# Patient Record
Sex: Female | Born: 1985
Health system: Southern US, Community
[De-identification: ages and names within clinical notes are randomized; demographics above are authoritative.]

## PROBLEM LIST (undated history)

## (undated) DIAGNOSIS — Z862 Personal history of diseases of the blood and blood-forming organs and certain disorders involving the immune mechanism: Secondary | ICD-10-CM

## (undated) DIAGNOSIS — I10 Essential (primary) hypertension: Secondary | ICD-10-CM

## (undated) DIAGNOSIS — I71012 Dissection of descending thoracic aorta: Secondary | ICD-10-CM

## (undated) DIAGNOSIS — I7101 Dissection of thoracic aorta: Secondary | ICD-10-CM

## (undated) DIAGNOSIS — K859 Acute pancreatitis without necrosis or infection, unspecified: Secondary | ICD-10-CM

## (undated) HISTORY — DX: Dissection of descending thoracic aorta: I71.012

## (undated) HISTORY — DX: Dissection of thoracic aorta: I71.01

---
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
## 2006-08-27 ENCOUNTER — Emergency Department (HOSPITAL_COMMUNITY): Admission: EM | Admit: 2006-08-27 | Discharge: 2006-08-27 | Payer: Self-pay | Admitting: Emergency Medicine

## 2006-10-01 ENCOUNTER — Other Ambulatory Visit: Admission: RE | Admit: 2006-10-01 | Discharge: 2006-10-01 | Payer: Self-pay | Admitting: Obstetrics and Gynecology

## 2008-03-21 ENCOUNTER — Other Ambulatory Visit: Admission: RE | Admit: 2008-03-21 | Discharge: 2008-03-21 | Payer: Self-pay | Admitting: Obstetrics and Gynecology

## 2008-03-23 ENCOUNTER — Ambulatory Visit (HOSPITAL_COMMUNITY): Admission: RE | Admit: 2008-03-23 | Discharge: 2008-03-23 | Payer: Self-pay | Admitting: Obstetrics and Gynecology

## 2008-04-13 ENCOUNTER — Ambulatory Visit (HOSPITAL_COMMUNITY): Admission: RE | Admit: 2008-04-13 | Discharge: 2008-04-13 | Payer: Self-pay | Admitting: Obstetrics and Gynecology

## 2008-05-04 ENCOUNTER — Ambulatory Visit (HOSPITAL_COMMUNITY): Admission: RE | Admit: 2008-05-04 | Discharge: 2008-05-04 | Payer: Self-pay | Admitting: Obstetrics and Gynecology

## 2008-06-01 ENCOUNTER — Ambulatory Visit (HOSPITAL_COMMUNITY): Admission: RE | Admit: 2008-06-01 | Discharge: 2008-06-01 | Payer: Self-pay | Admitting: Obstetrics and Gynecology

## 2008-09-21 ENCOUNTER — Inpatient Hospital Stay (HOSPITAL_COMMUNITY): Admission: AD | Admit: 2008-09-21 | Discharge: 2008-09-23 | Payer: Self-pay | Admitting: Obstetrics and Gynecology

## 2009-04-11 ENCOUNTER — Other Ambulatory Visit: Admission: RE | Admit: 2009-04-11 | Discharge: 2009-04-11 | Payer: Self-pay | Admitting: Otolaryngology

## 2009-05-01 ENCOUNTER — Encounter (INDEPENDENT_AMBULATORY_CARE_PROVIDER_SITE_OTHER): Payer: Self-pay | Admitting: Otolaryngology

## 2009-05-01 ENCOUNTER — Ambulatory Visit (HOSPITAL_BASED_OUTPATIENT_CLINIC_OR_DEPARTMENT_OTHER): Admission: RE | Admit: 2009-05-01 | Discharge: 2009-05-02 | Payer: Self-pay | Admitting: Otolaryngology

## 2009-05-01 HISTORY — PX: SUBMANDIBULAR GLAND EXCISION: SHX2456

## 2010-01-29 ENCOUNTER — Emergency Department (HOSPITAL_COMMUNITY): Admission: EM | Admit: 2010-01-29 | Discharge: 2010-01-29 | Payer: Self-pay | Admitting: Emergency Medicine

## 2011-01-08 ENCOUNTER — Inpatient Hospital Stay (HOSPITAL_COMMUNITY)
Admission: AD | Admit: 2011-01-08 | Discharge: 2011-01-08 | Payer: Self-pay | Source: Home / Self Care | Attending: Obstetrics & Gynecology | Admitting: Obstetrics & Gynecology

## 2011-01-09 LAB — URINALYSIS, ROUTINE W REFLEX MICROSCOPIC
Hgb urine dipstick: NEGATIVE
Urine Glucose, Fasting: NEGATIVE mg/dL

## 2011-01-09 LAB — GC/CHLAMYDIA PROBE AMP, GENITAL: GC Probe Amp, Genital: NEGATIVE

## 2011-01-09 LAB — WET PREP, GENITAL

## 2011-02-21 ENCOUNTER — Other Ambulatory Visit (HOSPITAL_COMMUNITY)
Admission: RE | Admit: 2011-02-21 | Discharge: 2011-02-21 | Disposition: A | Discharge status: Home / Self Care | Payer: Medicaid Other | Source: Ambulatory Visit | Attending: Obstetrics and Gynecology | Admitting: Obstetrics and Gynecology

## 2011-02-21 ENCOUNTER — Other Ambulatory Visit: Payer: Self-pay | Admitting: Obstetrics and Gynecology

## 2011-02-21 DIAGNOSIS — Z01419 Encounter for gynecological examination (general) (routine) without abnormal findings: Secondary | ICD-10-CM | POA: Insufficient documentation

## 2011-02-21 DIAGNOSIS — Z113 Encounter for screening for infections with a predominantly sexual mode of transmission: Secondary | ICD-10-CM | POA: Insufficient documentation

## 2011-03-01 ENCOUNTER — Inpatient Hospital Stay (HOSPITAL_COMMUNITY)
Admission: AD | Admit: 2011-03-01 | Discharge: 2011-03-01 | Disposition: A | Discharge status: Home / Self Care | Payer: Medicaid Other | Source: Ambulatory Visit | Attending: Obstetrics and Gynecology | Admitting: Obstetrics and Gynecology

## 2011-03-01 DIAGNOSIS — O212 Late vomiting of pregnancy: Secondary | ICD-10-CM

## 2011-03-01 DIAGNOSIS — O99891 Other specified diseases and conditions complicating pregnancy: Secondary | ICD-10-CM | POA: Insufficient documentation

## 2011-03-01 DIAGNOSIS — R1012 Left upper quadrant pain: Secondary | ICD-10-CM

## 2011-03-01 DIAGNOSIS — O9989 Other specified diseases and conditions complicating pregnancy, childbirth and the puerperium: Secondary | ICD-10-CM

## 2011-03-01 LAB — COMPREHENSIVE METABOLIC PANEL
ALT: 27 U/L (ref 0–35)
AST: 19 U/L (ref 0–37)
Alkaline Phosphatase: 48 U/L (ref 39–117)
Calcium: 8.8 mg/dL (ref 8.4–10.5)
Creatinine, Ser: 0.51 mg/dL (ref 0.4–1.2)
GFR calc Af Amer: >60 mL/min (ref >60)
Glucose, Bld: 76 mg/dL (ref 70–99)
Potassium: 3.6 mEq/L (ref 3.5–5.1)
Sodium: 136 mEq/L (ref 135–145)
Total Bilirubin: 0.8 mg/dL (ref 0.3–1.2)

## 2011-03-01 LAB — URINALYSIS, ROUTINE W REFLEX MICROSCOPIC
Specific Gravity, Urine: 1.015 (ref 1.005–1.030)
pH: 7 (ref 5.0–8.0)

## 2011-03-01 LAB — CBC
Hemoglobin: 9.6 g/dL — ABNORMAL LOW (ref 12.0–15.0)
MCH: 31.6 pg (ref 26.0–34.0)
MCHC: 32.5 g/dL (ref 30.0–36.0)
MCV: 97 fL (ref 78.0–100.0)
Platelets: 171 10*3/uL (ref 150–400)
RBC: 3.04 MIL/uL — ABNORMAL LOW (ref 3.87–5.11)
RDW: 13.6 % (ref 11.5–15.5)

## 2011-03-26 LAB — BASIC METABOLIC PANEL
BUN: 6 mg/dL (ref 6–23)
GFR calc Af Amer: >60 mL/min (ref >60)
Potassium: 3.4 mEq/L — ABNORMAL LOW (ref 3.5–5.1)
Sodium: 141 mEq/L (ref 135–145)

## 2011-04-17 ENCOUNTER — Inpatient Hospital Stay (HOSPITAL_COMMUNITY)
Admission: AD | Admit: 2011-04-17 | Discharge: 2011-04-21 | DRG: 774 | Disposition: A | Discharge status: Home / Self Care | Payer: Medicaid Other | Source: Ambulatory Visit | Attending: Obstetrics and Gynecology | Admitting: Obstetrics and Gynecology

## 2011-04-17 ENCOUNTER — Inpatient Hospital Stay (HOSPITAL_COMMUNITY): Payer: Medicaid Other

## 2011-04-17 DIAGNOSIS — O139 Gestational [pregnancy-induced] hypertension without significant proteinuria, unspecified trimester: Principal | ICD-10-CM | POA: Diagnosis present

## 2011-04-17 LAB — CBC
HCT: 31 % — ABNORMAL LOW (ref 36.0–46.0)
MCHC: 32.9 g/dL (ref 30.0–36.0)
RBC: 3.34 MIL/uL — ABNORMAL LOW (ref 3.87–5.11)
RDW: 13.1 % (ref 11.5–15.5)

## 2011-04-17 LAB — COMPREHENSIVE METABOLIC PANEL
ALT: 18 U/L (ref 0–35)
Albumin: 2.4 g/dL — ABNORMAL LOW (ref 3.5–5.2)
CO2: 19 mEq/L (ref 19–32)
Chloride: 106 mEq/L (ref 96–112)
GFR calc Af Amer: >60 mL/min (ref >60)
Total Bilirubin: 0.6 mg/dL (ref 0.3–1.2)

## 2011-04-17 LAB — URINALYSIS, DIPSTICK ONLY
Hgb urine dipstick: NEGATIVE
Ketones, ur: NEGATIVE mg/dL
Leukocytes, UA: NEGATIVE
Protein, ur: NEGATIVE mg/dL
Specific Gravity, Urine: <1.005 — ABNORMAL LOW (ref 1.005–1.030)
pH: 7 (ref 5.0–8.0)

## 2011-04-17 LAB — URIC ACID: Uric Acid, Serum: 3.1 mg/dL (ref 2.4–7.0)

## 2011-04-18 DIAGNOSIS — O139 Gestational [pregnancy-induced] hypertension without significant proteinuria, unspecified trimester: Secondary | ICD-10-CM

## 2011-04-19 LAB — CBC
HCT: 29 % — ABNORMAL LOW (ref 36.0–46.0)
Hemoglobin: 9.2 g/dL — ABNORMAL LOW (ref 12.0–15.0)
MCH: 29.8 pg (ref 26.0–34.0)
MCHC: 31.7 g/dL (ref 30.0–36.0)
MCV: 93.9 fL (ref 78.0–100.0)
RDW: 13.4 % (ref 11.5–15.5)

## 2011-04-19 LAB — COMPREHENSIVE METABOLIC PANEL
ALT: 16 U/L (ref 0–35)
BUN: 4 mg/dL — ABNORMAL LOW (ref 6–23)
CO2: 23 mEq/L (ref 19–32)
Calcium: 7.6 mg/dL — ABNORMAL LOW (ref 8.4–10.5)
Creatinine, Ser: 0.58 mg/dL (ref 0.4–1.2)
GFR calc non Af Amer: >60 mL/min (ref >60)
Glucose, Bld: 105 mg/dL — ABNORMAL HIGH (ref 70–99)

## 2011-04-20 LAB — RPR: RPR Ser Ql: NONREACTIVE

## 2011-04-30 NOTE — Op Note (Signed)
NAME:  Robin Guerrero, Robin Guerrero           ACCOUNT NO.:  [id number]   MEDICAL RECORD NO.:  [id number]          PATIENT TYPE:  AMB   LOCATION:  DSC                          FACILITY:  [facility]   PHYSICIAN:  Jefry H. Pollyann Kennedy, MD     DATE OF BIRTH:  Nov 03, 1986   DATE OF PROCEDURE:  05/01/2009  DATE OF DISCHARGE:                               OPERATIVE REPORT   PREOPERATIVE DIAGNOSIS:  Right submandibular mass.   POSTOPERATIVE DIAGNOSIS:  Right submandibular mass.   PROCEDURE:  Excision of the right submandibular gland.   SURGEON:  Jefry H. Pollyann Kennedy, MD   ASSISTANT:  Kinnie Scales. Annalee Genta, MD   ANESTHESIA:  General endotracheal anesthesia was used.   COMPLICATIONS:  None.   ESTIMATED BLOOD LOSS:  40 mL.   FINDINGS:  Firm 3-4 cm mass within the right submandibular gland, one  enlarged facial lymph node about 1 cm in diameter.  No other abnormal  findings.   REFERRING PHYSICIAN:  Dr. Para March.   HISTORY:  A 25 year old with a history of a slowly enlarging mass under  the right side of the jaw bone.  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  The risks, benefits, alternatives,  complications of the procedure were explained to the patient.  She  seemed to understand and agreed for surgery.   PROCEDURE:  The patient was taken to the operating room and placed in  the operating table in supine position.  Following induction of the  general endotracheal anesthesia using a laryngeal mask airway, the right  side of the neck was prepped and draped in standard fashion.  An  incision was outlined with a marking pen in a skin crease about two  fingerbreadths below the mandible.  Xylocaine with epinephrine was  injected.  A 15 scalpel was used to incise the size and subcutaneous  tissue.  Electrocautery was used to complete the dissection down to the  platysma layer.  Subplatysmal flap was developed superiorly up to the  mandible.  Attempts were made to identify the mandibular branch of  the  facial nerve, but in the plain of dissection, this was not identified  and presumably was up closer to the mandible.  Dissection continued down  to the fascial vessels, which were separately ligated between clamps and  divided.  A solitary facial lymph node was dissected free and  surrounding tissue was taken with the specimen.  The deeper dissection  continued down towards the mylohyoid muscle.  This was reflected  anteriorly exposing the submandibular duct, which was ligated between  clamps and divided.  The lingual nerve was preserved.  The ganglion was  taken with the specimen.  The fascial artery was identified and also  ligated between clamps and divided.  Specimens brought off of the  lateral surface of the digastric muscle.  The hypoglossal nerve was not  seen during the dissection and was felt to be deeper than the level of  dissection.  The gland was removed from the wound and sent for  pathologic evaluation.  The wound was irrigated, cautery and silk  ligatures were used for completion of  hemostasis.  The wound was then  closed in layers using 3-0 chromic on the platysma layer and the  subcuticular layer.  Dermabond was used on the skin.  A 7-French round  JP drain was left in the wound, exiting through the posterior aspect of  the incision and secured in placed with the nylon suture.  The patient  was awakened from anesthesia, extubated, and transferred to recovery in  stable condition.      Jefry H. Pollyann Kennedy, MD  Electronically Signed     JHR/MEDQ  D:  05/01/2009  T:  05/02/2009  Job:  [id number]   cc:   Dr. Para March

## 2011-07-20 NOTE — Discharge Summary (Signed)
NAMEMarland Guerrero  MADI, BONFIGLIO NO.:  [id number]  MEDICAL RECORD NO.:  [id number]  LOCATION:  9131                          FACILITY:  [facility]  PHYSICIAN:  Pieter Partridge, MD   DATE OF BIRTH:  07/27/1986  DATE OF ADMISSION:  04/17/2011 DATE OF DISCHARGE:  04/21/2011                              DISCHARGE SUMMARY   ADMITTING DIAGNOSES:  Pregnancy at 37-6/7 weeks', gestational hypertension with severe range blood pressures.  DISCHARGE DIAGNOSES:  Pregnancy at 37-6/7 weeks', gestational hypertension with severe range blood pressures and status post normal spontaneous vaginal delivery.  HOSPITAL COURSE:  Ms. [name] is a 25 year old gravida 2, para 1-0-0-1 who presented to the office at 37 weeks for a routine OB visit and was noted to have blood pressure of 144/104.  She was then admitted to the hospital for observation.  Her blood pressures remained elevated and in the severe range.  She was then induced with Cervidil on Apr 18, 2011. She had a precipitous normal vaginal delivery 7 pounds 6 ounces girl, healthy, Apgars 8 and 9.  Her postoperative course, she was on magnesium for a total of 24 hours. Her blood pressures postpartum day #1 range from 125/62 to 78/100. Overnight on postpartum day #1, her blood pressures were elevated in ICU and she was already on Procardia XL 30 mg due to the severe elevation, labetalol 100 b.i.d. was added.  The blood pressure reading, her diastolics got up into 105 and 118, so at time she was given Labetalol.  On the morning of postpartum day #2, the patient was without complaints, no headaches, visual changes, blood pressure range from 124 to 144/68 to 95 that was with Procardia and labetalol on board.  Her labetalol supposed to be healed and to continue observation.  However, due to ordering and timing it was continued, so the patient was actually held for another day to see how she responded on 1 agent.  On postpartum day #3, her blood  pressures were good.  The diastolics were a little low 134/94 the blood pressure in the morning just on Procardia.  The patient was discharged home on Procardia XL 30 mg.  There was a Forensic psychologist consult ordered as well.  The patient was under little stress going to a new apartment without the help of her mother, so she would be followed closely to make sure she did not develop postpartum depression.  She is to follow up in 1 week at her doctor's office, Dr. Richardson Dopp.  DISCHARGE INFORMATION:  The patient was discharged to home in stable condition.  ACTIVITY:  Pelvic rest.  DIET:  Low sodium.  MEDICATIONS:  Procardia XL 30 mg p.o.  Return visit in 1-week.     Pieter Partridge, MD     EBV/MEDQ  D:  07/19/2011  T:  07/20/2011  Job:  [id number]

## 2011-08-22 ENCOUNTER — Ambulatory Visit (HOSPITAL_COMMUNITY)
Admission: RE | Admit: 2011-08-22 | Discharge: 2011-08-22 | Disposition: A | Discharge status: Home / Self Care | Payer: Self-pay | Attending: Psychiatry | Admitting: Psychiatry

## 2011-08-22 ENCOUNTER — Ambulatory Visit (HOSPITAL_BASED_OUTPATIENT_CLINIC_OR_DEPARTMENT_OTHER): Payer: Medicaid Other | Admitting: Psychology

## 2011-08-22 DIAGNOSIS — F322 Major depressive disorder, single episode, severe without psychotic features: Secondary | ICD-10-CM

## 2011-08-22 DIAGNOSIS — F321 Major depressive disorder, single episode, moderate: Secondary | ICD-10-CM | POA: Insufficient documentation

## 2011-08-28 ENCOUNTER — Encounter (HOSPITAL_COMMUNITY): Payer: Medicaid Other | Admitting: Psychology

## 2011-09-16 LAB — COMPREHENSIVE METABOLIC PANEL
ALT: 24
BUN: 3 — ABNORMAL LOW
Calcium: 9.2
Glucose, Bld: 96
Sodium: 139
Total Protein: 5.9 — ABNORMAL LOW

## 2011-09-16 LAB — CBC
HCT: 32.1 — ABNORMAL LOW
Hemoglobin: 8.7 — ABNORMAL LOW
Platelets: 182
RBC: 2.55 — ABNORMAL LOW
RDW: 12.9

## 2011-09-16 LAB — URINALYSIS, ROUTINE W REFLEX MICROSCOPIC
Bilirubin Urine: NEGATIVE
Leukocytes, UA: NEGATIVE
Nitrite: NEGATIVE
Specific Gravity, Urine: 1.01
Urobilinogen, UA: 0.2

## 2011-09-16 LAB — URINE MICROSCOPIC-ADD ON

## 2011-09-16 LAB — URIC ACID: Uric Acid, Serum: 3.5

## 2011-09-16 LAB — LACTATE DEHYDROGENASE: LDH: 143

## 2012-01-16 ENCOUNTER — Encounter (HOSPITAL_COMMUNITY): Payer: Self-pay | Admitting: General Practice

## 2012-01-16 ENCOUNTER — Emergency Department (HOSPITAL_COMMUNITY)
Admission: EM | Admit: 2012-01-16 | Discharge: 2012-01-16 | Disposition: A | Discharge status: Home / Self Care | Payer: Self-pay | Attending: Emergency Medicine | Admitting: Emergency Medicine

## 2012-01-16 DIAGNOSIS — J329 Chronic sinusitis, unspecified: Secondary | ICD-10-CM | POA: Insufficient documentation

## 2012-01-16 DIAGNOSIS — IMO0001 Reserved for inherently not codable concepts without codable children: Secondary | ICD-10-CM | POA: Insufficient documentation

## 2012-01-16 DIAGNOSIS — J069 Acute upper respiratory infection, unspecified: Secondary | ICD-10-CM | POA: Insufficient documentation

## 2012-01-16 DIAGNOSIS — R5381 Other malaise: Secondary | ICD-10-CM | POA: Insufficient documentation

## 2012-01-16 HISTORY — DX: Essential (primary) hypertension: I10

## 2012-01-16 MED ORDER — AZITHROMYCIN 250 MG PO TABS: 250.0000 mg | ORAL_TABLET | Freq: Every day | ORAL | Status: AC

## 2012-01-16 NOTE — ED Provider Notes (Signed)
Medical screening examination/treatment/procedure(s) were performed by non-physician practitioner and as supervising physician I was immediately available for consultation/collaboration. Briza Bark, MD, FACEP   Shaterria Sager L Shaylin Blatt, MD 01/16/12 1513 

## 2012-01-16 NOTE — ED Notes (Signed)
Pt c/o of Generalized body aches and fever. Pt c/o of cough and popping in her ears. Pt also reports diarrhea, nausea but no vomiting.

## 2012-01-16 NOTE — ED Notes (Signed)
Patient reports that she has had flu like symptoms since Sunday. Patient states that today she started having diarrhea and her other issues were unresolved and fel that she needed to be seen in the ED.

## 2012-01-16 NOTE — ED Provider Notes (Addendum)
History     CSN: [id number]  Arrival date & time 01/16/12  1048   First MD Initiated Contact with Patient 01/16/12 1142      Chief Complaint  Patient presents with  . Generalized Body Aches  . Weakness  . Nasal Congestion    (Consider location/radiation/quality/duration/timing/severity/associated sxs/prior treatment) Patient is a 26 y.o. female presenting with weakness. The history is provided by the patient.  Weakness Primary symptoms do not include fever or vomiting. Primary symptoms comment: Hot/cold chills, cough, body aches, generally weak and diarrhea for several days. The symptoms began 5 to 7 days ago. Context: She has congestion, sinus pressure and feels her ears are 'popping'.   Associated symptoms comments: She reports her son had similar symptoms last week. .    Past Medical History  Diagnosis Date  . Hypertension   . Depression     Past Surgical History  Procedure Date  . 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  History  Substance Use Topics  . Smoking status: Current Everyday Smoker -- 25.0 packs/day  . Smokeless tobacco: Not on file  . Alcohol Use: No    OB History    Grav Para Term Preterm Abortions TAB SAB Ect Mult Living                  Review of Systems  Constitutional: Negative for fever and chills.  HENT: Positive for ear pain and congestion.   Respiratory: Positive for cough.   Cardiovascular: Negative.  Negative for chest pain.  Gastrointestinal: Positive for diarrhea. Negative for vomiting.  Genitourinary: Negative for dysuria.  Musculoskeletal: Positive for myalgias and arthralgias.  Skin: Negative.  Negative for rash.    Allergies  Review of patient's allergies indicates no known allergies.  Home Medications   Current Outpatient Rx  Name Route Sig Dispense Refill  . NIFEDIPINE ER OSMOTIC 60 MG PO TB24 Oral Take 60 mg by mouth daily.    Marland Kitchen PHENYLEPHRINE-DM-GG-APAP  5-10-200-325 MG PO TABS Oral Take 2 tablets by mouth daily as needed. Flu symptons    . SERTRALINE HCL 100 MG PO TABS Oral Take 100 mg by mouth daily.      BP 167/119  Pulse 96  Temp(Src) 98.7 F (37.1 C) (Oral)  Resp 18  Wt 200 lb (90.719 kg)  SpO2 100%  LMP 12/26/2011  Physical Exam  Constitutional: She appears well-developed and well-nourished.  HENT:  Head: Normocephalic.  Right Ear: External ear normal.  Left Ear: External ear normal.  Nose: Mucosal edema present. Right sinus exhibits frontal sinus tenderness. Left sinus exhibits frontal sinus tenderness.  Mouth/Throat: Oropharynx is clear and moist.  Neck: Normal range of motion. Neck supple.  Cardiovascular: Normal rate and normal heart sounds.   No murmur heard. Pulmonary/Chest: Effort normal and breath sounds normal. She has no wheezes. She has no rales.  Abdominal: Soft. Bowel sounds are normal. She exhibits no distension. There is no tenderness. There is no rebound and no guarding.  Musculoskeletal: Normal range of motion.  Lymphadenopathy:    She has no cervical adenopathy.  Skin: Skin is warm and dry. No pallor.    ED Course  Procedures (including critical care time)  Labs Reviewed - No data to display No results found.   No diagnosis found.    MDM          Rodena Medin, PA-C 01/16/12 1236  Melvenia Beam  A Narvaez, PA-C 02/19/12 0800

## 2012-02-21 NOTE — ED Provider Notes (Signed)
See prior note   Ward Givens, MD 02/21/12 1843

## 2012-07-04 ENCOUNTER — Emergency Department (HOSPITAL_COMMUNITY)
Admission: EM | Admit: 2012-07-04 | Discharge: 2012-07-05 | Disposition: A | Discharge status: Home / Self Care | Payer: Self-pay | Attending: Emergency Medicine | Admitting: Emergency Medicine

## 2012-07-04 ENCOUNTER — Encounter (HOSPITAL_COMMUNITY): Payer: Self-pay | Admitting: Emergency Medicine

## 2012-07-04 DIAGNOSIS — R599 Enlarged lymph nodes, unspecified: Secondary | ICD-10-CM | POA: Insufficient documentation

## 2012-07-04 DIAGNOSIS — J039 Acute tonsillitis, unspecified: Secondary | ICD-10-CM | POA: Insufficient documentation

## 2012-07-04 DIAGNOSIS — R6883 Chills (without fever): Secondary | ICD-10-CM | POA: Insufficient documentation

## 2012-07-04 DIAGNOSIS — F17200 Nicotine dependence, unspecified, uncomplicated: Secondary | ICD-10-CM | POA: Insufficient documentation

## 2012-07-04 NOTE — ED Notes (Signed)
Pt alert, nad, c/o sore throat, onset a few days ago, resp even unlabored, skin pwd, denies recent ill contacts or exposure

## 2012-07-05 ENCOUNTER — Emergency Department (HOSPITAL_COMMUNITY): Payer: Self-pay

## 2012-07-05 LAB — POCT I-STAT, CHEM 8
BUN: 10 mg/dL (ref 6–23)
Calcium, Ion: 1.2 mmol/L (ref 1.12–1.23)
Chloride: 105 mEq/L (ref 96–112)
Glucose, Bld: 76 mg/dL (ref 70–99)

## 2012-07-05 LAB — CBC
HCT: 38.4 % (ref 36.0–46.0)
MCV: 91 fL (ref 78.0–100.0)
RBC: 4.22 MIL/uL (ref 3.87–5.11)
WBC: 12.8 10*3/uL — ABNORMAL HIGH (ref 4.0–10.5)

## 2012-07-05 MED ORDER — DEXAMETHASONE SODIUM PHOSPHATE 4 MG/ML IJ SOLN
10.0000 mg | Freq: Once | INTRAMUSCULAR | Status: AC
  Administered 2012-07-05: 10 mg via INTRAVENOUS
  Filled 2012-07-05: qty 3

## 2012-07-05 MED ORDER — HYDROCODONE-ACETAMINOPHEN 7.5-500 MG/15ML PO SOLN
10.0000 mL | Freq: Once | ORAL | Status: AC
  Administered 2012-07-05: 10 mL via ORAL
  Filled 2012-07-05: qty 15

## 2012-07-05 MED ORDER — IOHEXOL 300 MG/ML  SOLN
100.0000 mL | Freq: Once | INTRAMUSCULAR | Status: AC | PRN
  Administered 2012-07-05: 100 mL via INTRAVENOUS

## 2012-07-05 MED ORDER — HYDROCODONE-HOMATROPINE 5-1.5 MG/5ML PO SYRP: 5.0000 mL | ORAL_SOLUTION | Freq: Four times a day (QID) | ORAL | Status: AC | PRN

## 2012-07-05 MED ORDER — AMOXICILLIN-POT CLAVULANATE 875-125 MG PO TABS: 1.0000 | ORAL_TABLET | Freq: Two times a day (BID) | ORAL | Status: AC

## 2012-07-05 NOTE — ED Notes (Signed)
Pt remains in waiting area, cont to wait for eval, cont to monitor

## 2012-07-05 NOTE — ED Provider Notes (Signed)
History     CSN: [id number]  Arrival date & time 07/04/12  2048   First MD Initiated Contact with Patient 07/05/12 [phone]      Chief Complaint  Patient presents with  . Sore Throat    (Consider location/radiation/quality/duration/timing/severity/associated sxs/prior treatment) HPI Comments: Patient is a current everyday smoker that presents emergency department chief complaint of sore throat.  Onset of symptoms began Friday and has been gradually worsening.  Associated symptoms include night sweats and chills, but patient denies ever taking her temperature.  Patient states when she woke this morning she felt like the left side of her neck was swollen more and that she could not open her mouth all the way due to pain.  She reports a change in her voice and states that it even hurts when swallowing liquids.  Patient denies any myalgias, fatigue, cough, inability to swallow secretions, abdominal pain, nasal congestion, or headaches.  Patient is a 26 y.o. female presenting with pharyngitis. The history is provided by the patient.  Sore Throat Associated symptoms include chills and a sore throat. Pertinent negatives include no abdominal pain, chest pain, congestion, coughing, diaphoresis, fatigue, fever, headaches, neck pain or rash.    Past Medical History  Diagnosis Date  . Hypertension   . Depression     Past Surgical History  Procedure Date  . 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  History  Substance Use Topics  . Smoking status: Current Everyday Smoker -- 25.0 packs/day  . Smokeless tobacco: Not on file  . Alcohol Use: No    OB History    Grav Para Term Preterm Abortions TAB SAB Ect Mult Living                  Review of Systems  Constitutional: Positive for chills and appetite change. Negative for fever, diaphoresis, activity change and fatigue.  HENT: Positive for sore throat and trouble swallowing.  Negative for congestion, facial swelling, rhinorrhea, sneezing, drooling, neck pain, neck stiffness, dental problem, voice change, postnasal drip and sinus pressure.   Eyes: Negative for visual disturbance.  Respiratory: Negative for cough, choking, shortness of breath, wheezing and stridor.   Cardiovascular: Negative for chest pain.  Gastrointestinal: Negative for abdominal pain.  Skin: Negative for rash.  Neurological: Negative for dizziness and headaches.  Hematological: Positive for adenopathy. Does not bruise/bleed easily.  All other systems reviewed and are negative.    Allergies  Review of patient's allergies indicates no known allergies.  Home Medications   Current Outpatient Rx  Name Route Sig Dispense Refill  . SERTRALINE HCL 100 MG PO TABS Oral Take 100 mg by mouth daily.      BP 148/89  Pulse 105  Temp 98 F (36.7 C) (Oral)  Resp 16  SpO2 99%  LMP 06/14/2012  Physical Exam  Nursing note and vitals reviewed. Constitutional: She is oriented to person, place, and time. She appears well-developed and well-nourished. No distress.  HENT:  Head: Normocephalic and atraumatic. No trismus in the jaw.  Right Ear: Tympanic membrane, external ear and ear canal normal.  Left Ear: Tympanic membrane, external ear and ear canal normal.  Nose: Nose normal. No rhinorrhea. Right sinus exhibits no maxillary sinus tenderness and no frontal sinus tenderness. Left sinus exhibits no maxillary sinus tenderness and no frontal sinus tenderness.  Mouth/Throat: Uvula is midline and mucous membranes are normal. Normal dentition. No dental  abscesses or uvula swelling. Oropharyngeal exudate and posterior oropharyngeal edema present. No posterior oropharyngeal erythema or tonsillar abscesses.       No submental edema, tongue not elevated, mild trismus. No impending airway obstruction; Pt able to speak full sentences w muffled voice. Swallow intact, no drooling, stridor, or tonsillar/uvula  displacement. Left tonsil > right.  No palatal petechia  Eyes: Conjunctivae are normal.  Neck: Trachea normal, normal range of motion and full passive range of motion without pain. Neck supple. No rigidity. Erythema present. Normal range of motion present. No Brudzinski's sign noted.       Flexion and extension of neck without difficulty, but does induce pain. Able to breath without difficulty in extension.  Cardiovascular: Normal rate and regular rhythm.   Pulmonary/Chest: Effort normal and breath sounds normal. No stridor. No respiratory distress. She has no wheezes.  Abdominal: Soft. There is no tenderness.       No obvious evidence of splenomegaly. Non ttp.   Musculoskeletal: Normal range of motion.  Lymphadenopathy:       Head (right side): No preauricular and no posterior auricular adenopathy present.       Head (left side): No preauricular and no posterior auricular adenopathy present.    She has cervical adenopathy.  Neurological: She is alert and oriented to person, place, and time.  Skin: Skin is warm and dry. No rash noted. She is not diaphoretic.  Psychiatric: She has a normal mood and affect.    ED Course  Procedures (including critical care time)  Labs Reviewed  CBC - Abnormal; Notable for the following:    WBC 12.8 (*)     All other components within normal limits  POCT I-STAT, CHEM 8 - Abnormal; Notable for the following:    Potassium 3.2 (*)     All other components within normal limits  RAPID STREP SCREEN   Ct Soft Tissue Neck W Contrast  07/05/2012  *RADIOLOGY REPORT*  Clinical Data: Sore throat.  CT NECK WITH CONTRAST  Technique:  Multidetector CT imaging of the neck was performed with intravenous contrast.  Contrast: OMNIPAQUE IOHEXOL 300 MG/ML  SOLN  Comparison: None.  Findings: Heterogeneous enhancement and enlargement of the tonsils bilaterally, slightly greater on the left.  Changes consistent with inflammation or infection.  Mass lesions cannot be  entirely excluded. There is a tiny fluid collection lateral to the left tonsil with surrounding enhancement measuring 3 x 8 x 9 mm.  A tiny early peritonsillar abscess is suggested.  Mild prominence of cervical lymph nodes bilaterally, likely reactive.  No significant cervical or supraclavicular lymphadenopathy.  Soft tissue neck structures appear otherwise intact.  Salivary glands are symmetrical.  Cervical carotid and jugular vessels are patent.  No mucosal or laryngeal lesions identified.  Normal appearance of the thyroid gland.  Opacification of the left maxillary antrum, likely inflammatory.  IMPRESSION: Heterogeneous enlargement of the tonsils bilaterally most likely representing inflammatory process.  Tiny fluid collection adjacent to the left tonsil may represent a small abscess.  Opacification of the left maxillary antrum is likely inflammatory.  Original Report Authenticated By: Marlon Pel, M.D.     No diagnosis found.    MDM  Sore throat, tonsillitis  Pt will be dc on augmentin x 7 days. Discussed strict return precautions. Pt case and treatment discussed with Dr. Read Drivers who agrees with my treatment plan.  At this time there does not appear to be any evidence of an acute emergency medical condition and the patient  appears stable for discharge with appropriate outpatient follow up.Diagnosis was discussed with patient who verbalizes understanding and is agreeable to discharge.         Jaci Carrel, [place] 07/05/12 [phone]

## 2012-07-05 NOTE — ED Provider Notes (Signed)
Medical screening examination/treatment/procedure(s) were performed by non-physician practitioner and as supervising physician I was immediately available for consultation/collaboration.   Makana Feigel L Cedrica Brune, MD 07/05/12 0732 

## 2013-07-10 ENCOUNTER — Emergency Department (HOSPITAL_COMMUNITY)
Admission: EM | Admit: 2013-07-10 | Discharge: 2013-07-11 | Disposition: A | Discharge status: Home / Self Care | Payer: Self-pay | Attending: Emergency Medicine | Admitting: Emergency Medicine

## 2013-07-10 ENCOUNTER — Encounter (HOSPITAL_COMMUNITY): Payer: Self-pay | Admitting: Emergency Medicine

## 2013-07-10 DIAGNOSIS — R220 Localized swelling, mass and lump, head: Secondary | ICD-10-CM | POA: Insufficient documentation

## 2013-07-10 DIAGNOSIS — Z113 Encounter for screening for infections with a predominantly sexual mode of transmission: Secondary | ICD-10-CM | POA: Insufficient documentation

## 2013-07-10 DIAGNOSIS — R221 Localized swelling, mass and lump, neck: Secondary | ICD-10-CM | POA: Insufficient documentation

## 2013-07-10 DIAGNOSIS — L259 Unspecified contact dermatitis, unspecified cause: Secondary | ICD-10-CM | POA: Insufficient documentation

## 2013-07-10 DIAGNOSIS — N898 Other specified noninflammatory disorders of vagina: Secondary | ICD-10-CM | POA: Insufficient documentation

## 2013-07-10 DIAGNOSIS — Z711 Person with feared health complaint in whom no diagnosis is made: Secondary | ICD-10-CM

## 2013-07-10 DIAGNOSIS — H5789 Other specified disorders of eye and adnexa: Secondary | ICD-10-CM | POA: Insufficient documentation

## 2013-07-10 DIAGNOSIS — Z8659 Personal history of other mental and behavioral disorders: Secondary | ICD-10-CM | POA: Insufficient documentation

## 2013-07-10 DIAGNOSIS — L539 Erythematous condition, unspecified: Secondary | ICD-10-CM | POA: Insufficient documentation

## 2013-07-10 DIAGNOSIS — I10 Essential (primary) hypertension: Secondary | ICD-10-CM | POA: Insufficient documentation

## 2013-07-10 DIAGNOSIS — F17200 Nicotine dependence, unspecified, uncomplicated: Secondary | ICD-10-CM | POA: Insufficient documentation

## 2013-07-10 DIAGNOSIS — R21 Rash and other nonspecific skin eruption: Secondary | ICD-10-CM | POA: Insufficient documentation

## 2013-07-10 NOTE — ED Notes (Addendum)
Pt c/o ?allergic reaction yesterday to unkn allergen. Pt states face now itching with small bumps around mouth. Pt also would like pelvic exam d/t vaginal discharge and itching and "bump" on labia. Pt has not been taking HTN meds d/t to funds.

## 2013-07-11 LAB — GC/CHLAMYDIA PROBE AMP
CT Probe RNA: NEGATIVE
GC Probe RNA: NEGATIVE

## 2013-07-11 LAB — WET PREP, GENITAL: Yeast Wet Prep HPF POC: NONE SEEN

## 2013-07-11 MED ORDER — AZITHROMYCIN 250 MG PO TABS
1000.0000 mg | ORAL_TABLET | Freq: Once | ORAL | Status: AC
  Administered 2013-07-11: 1000 mg via ORAL
  Filled 2013-07-11: qty 4

## 2013-07-11 MED ORDER — HYDROCORTISONE 1 % EX CREA: TOPICAL_CREAM | Freq: Two times a day (BID) | CUTANEOUS | Status: DC

## 2013-07-11 MED ORDER — CEFTRIAXONE SODIUM 250 MG IJ SOLR
250.0000 mg | Freq: Once | INTRAMUSCULAR | Status: AC
  Administered 2013-07-11: 250 mg via INTRAMUSCULAR
  Filled 2013-07-11: qty 250

## 2013-07-11 NOTE — ED Provider Notes (Signed)
CSN: [id number]     Arrival date & time 07/10/13  2335 History     First MD Initiated Contact with Patient 07/11/13 0036     Chief Complaint  Patient presents with  . Allergic Reaction  . Vaginal Discharge   (Consider location/radiation/quality/duration/timing/severity/associated sxs/prior Treatment) The history is provided by the patient and medical records. No language interpreter was used.    Robin Guerrero is a 27 y.o. female  with a hx of HTN presents to the Emergency Department complaining of possible allergic reaction with swelling of the eyes and cheeks as well as erythema.  Pt took no medications and the symptoms resolved on their own.  Pt states there are now small bumps on her chin that itch.  Pt has used alcohol and neosporin on the bumps without relief. Nothing makes it better or worse.  Pt is also concerned that she might have an STD because her friend does, but denies vaginal discharge, vaginal bleeding, vaginal itching abd pain, N/V/D, weakness, dizziness, syncope, dysuria, hematuria.  Patient states she's had 2 female sexual partners in the last 6 months. She has only a remote history of STD. Pt denies fever, chills, headache, neck pain, chest pain, shortness of breath.     Past Medical History  Diagnosis Date  . Hypertension   . Depression    Past Surgical History  Procedure Laterality Date  . 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 History  Substance Use Topics  . Smoking status: Current Every Day Smoker -- 25.00 packs/day  . Smokeless tobacco: Not on file  . Alcohol Use: No   OB History   Grav Para Term Preterm Abortions TAB SAB Ect Mult Living                 Review of Systems  Constitutional: Negative for fever, diaphoresis, appetite change, fatigue and unexpected weight change.  HENT: Negative for mouth sores and neck stiffness.   Eyes: Negative for visual disturbance.  Respiratory:  Negative for cough, chest tightness, shortness of breath and wheezing.   Cardiovascular: Negative for chest pain.  Gastrointestinal: Negative for nausea, vomiting, abdominal pain, diarrhea and constipation.  Endocrine: Negative for polydipsia, polyphagia and polyuria.  Genitourinary: Negative for dysuria, urgency, frequency, hematuria, decreased urine volume, vaginal bleeding, vaginal discharge and vaginal pain.  Musculoskeletal: Negative for back pain.  Skin: Positive for rash.  Allergic/Immunologic: Negative for immunocompromised state.  Neurological: Negative for syncope, light-headedness and headaches.  Hematological: Does not bruise/bleed easily.  Psychiatric/Behavioral: Negative for sleep disturbance. The patient is not nervous/anxious.     Allergies  Review of patient's allergies indicates no known allergies.  Home Medications   Current Outpatient Rx  Name  Route  Sig  Dispense  Refill  . hydrocortisone cream 1 %   Topical   Apply topically 2 (two) times daily.   30 g   2    BP 205/130  Pulse 89  Temp(Src) 99.5 F (37.5 C) (Oral)  Resp 19  Ht 5\' 10"  (1.778 m)  Wt 205 lb (92.987 kg)  BMI 29.41 kg/m2  SpO2 99% Physical Exam  Nursing note and vitals reviewed. Constitutional: She is oriented to person, place, and time. She appears well-developed and well-nourished. No distress.  Awake, alert, nontoxic appearance  HENT:  Head: Normocephalic and atraumatic.  Mouth/Throat: Oropharynx is clear and moist. No oropharyngeal exudate.  Eyes: Conjunctivae and EOM are normal. Pupils  are equal, round, and reactive to light. No scleral icterus.  Neck: Normal range of motion. Neck supple.  Cardiovascular: Normal rate, regular rhythm, normal heart sounds and intact distal pulses.   No murmur heard. Pulmonary/Chest: Effort normal and breath sounds normal. No respiratory distress. She has no wheezes. She has no rales. She exhibits no tenderness.  Abdominal: Soft. Bowel sounds are  normal. She exhibits no distension and no mass. There is no hepatosplenomegaly. There is no tenderness. There is no rebound, no guarding and no CVA tenderness. Hernia confirmed negative in the right inguinal area and confirmed negative in the left inguinal area.  Genitourinary: Uterus normal. Pelvic exam was performed with patient supine. There is no rash, tenderness or lesion on the right labia. There is no rash, tenderness or lesion on the left labia. Uterus is not deviated, not enlarged, not fixed and not tender. Cervix exhibits no motion tenderness, no discharge and no friability. Right adnexum displays no mass, no tenderness and no fullness. Left adnexum displays no mass, no tenderness and no fullness. No erythema, tenderness or bleeding around the vagina. No foreign body around the vagina. No signs of injury around the vagina. Vaginal discharge (thick, white, malodorous, scant) found.  Musculoskeletal: Normal range of motion. She exhibits no edema.  Lymphadenopathy:    She has no cervical adenopathy.       Right: No inguinal adenopathy present.       Left: No inguinal adenopathy present.  Neurological: She is alert and oriented to person, place, and time. She exhibits normal muscle tone. Coordination normal.  Speech is clear and goal oriented Moves extremities without ataxia  Skin: Skin is warm and dry. Rash noted. She is not diaphoretic.  Rash consisting of fine papules located in the perioral region as well as on the cheeks; non-erythematous, non vesicular,   Psychiatric: She has a normal mood and affect.    ED Course   Procedures (including critical care time)  Labs Reviewed  WET PREP, GENITAL - Abnormal; Notable for the following:    Clue Cells Wet Prep HPF POC FEW (*)    WBC, Wet Prep HPF POC FEW (*)    All other components within normal limits  GC/CHLAMYDIA PROBE AMP   No results found. 1. Concern about STD in female without diagnosis   2. Contact dermatitis     MDM   Robin Guerrero presents for rash and STD check.  Perioral rash appears to be contact dermatitis. There is evidence of this rash anywhere up on the patient's body. She has no swelling of the lips, tongue or oropharynx. She has no stridor, is handling her secretions without difficulty, has an intact airway, is tolerating by mouth in department.  We'll treat with low potency hydrocortisone cream for 3 days only. Recommended Benadryl for itching.    Patient to be discharged with instructions to follow up with OBGYN. Discussed importance of using protection when sexually active. Pt understands that they have GC/Chlamydia cultures pending and that they will need to inform all sexual partners if results return positive. Pt has been treated prophylacticly with azithromycin and rocephin due to pts concern for STD and wet prep with a few WBCs. Pt not concerning for PID because hemodynamically stable and no cervical motion tenderness on pelvic exam.  I have also discussed reasons to return immediately to the ER.  Patient expresses understanding and agrees with plan.     Dahlia Client Jerni Selmer, PA-C 07/11/13 0148

## 2013-07-11 NOTE — ED Provider Notes (Signed)
Medical screening examination/treatment/procedure(s) were performed by non-physician practitioner and as supervising physician I was immediately available for consultation/collaboration.  Marjory Meints, MD 07/11/13 0350 

## 2013-08-09 ENCOUNTER — Emergency Department (HOSPITAL_COMMUNITY)
Admission: EM | Admit: 2013-08-09 | Discharge: 2013-08-09 | Disposition: A | Discharge status: Home / Self Care | Payer: Self-pay | Attending: Emergency Medicine | Admitting: Emergency Medicine

## 2013-08-09 ENCOUNTER — Encounter (HOSPITAL_COMMUNITY): Payer: Self-pay | Admitting: Emergency Medicine

## 2013-08-09 DIAGNOSIS — I10 Essential (primary) hypertension: Secondary | ICD-10-CM | POA: Insufficient documentation

## 2013-08-09 DIAGNOSIS — R42 Dizziness and giddiness: Secondary | ICD-10-CM | POA: Insufficient documentation

## 2013-08-09 DIAGNOSIS — F419 Anxiety disorder, unspecified: Secondary | ICD-10-CM

## 2013-08-09 DIAGNOSIS — R002 Palpitations: Secondary | ICD-10-CM | POA: Insufficient documentation

## 2013-08-09 DIAGNOSIS — F411 Generalized anxiety disorder: Secondary | ICD-10-CM | POA: Insufficient documentation

## 2013-08-09 DIAGNOSIS — Z3202 Encounter for pregnancy test, result negative: Secondary | ICD-10-CM | POA: Insufficient documentation

## 2013-08-09 DIAGNOSIS — Z8659 Personal history of other mental and behavioral disorders: Secondary | ICD-10-CM | POA: Insufficient documentation

## 2013-08-09 DIAGNOSIS — F17200 Nicotine dependence, unspecified, uncomplicated: Secondary | ICD-10-CM | POA: Insufficient documentation

## 2013-08-09 LAB — URINALYSIS, ROUTINE W REFLEX MICROSCOPIC
Leukocytes, UA: NEGATIVE
Nitrite: NEGATIVE
Protein, ur: 30 mg/dL — AB
Specific Gravity, Urine: 1.017 (ref 1.005–1.030)
Urobilinogen, UA: 1 mg/dL (ref 0.0–1.0)

## 2013-08-09 LAB — POCT I-STAT, CHEM 8
Calcium, Ion: 1.18 mmol/L (ref 1.12–1.23)
Creatinine, Ser: 0.8 mg/dL (ref 0.50–1.10)
Glucose, Bld: 95 mg/dL (ref 70–99)
HCT: 37 % (ref 36.0–46.0)
Hemoglobin: 12.6 g/dL (ref 12.0–15.0)

## 2013-08-09 LAB — D-DIMER, QUANTITATIVE: D-Dimer, Quant: <0.27 ug/mL-FEU (ref 0.00–0.48)

## 2013-08-09 LAB — PREGNANCY, URINE: Preg Test, Ur: NEGATIVE

## 2013-08-09 MED ORDER — LORAZEPAM 1 MG PO TABS
1.0000 mg | ORAL_TABLET | Freq: Once | ORAL | Status: AC
  Administered 2013-08-09: 1 mg via ORAL
  Filled 2013-08-09: qty 1

## 2013-08-09 NOTE — ED Notes (Signed)
States that she and her mother had and augument this am and she got dizzy and had cp

## 2013-08-09 NOTE — ED Provider Notes (Signed)
CSN: [id number]     Arrival date & time 08/09/13  1122 History     First MD Initiated Contact with Patient 08/09/13 1217     Chief Complaint  Patient presents with  . Dizziness  . Palpitations    HPI Pt was seen at 1220. Per pt, c/o sudden onset and improvement in one episode of "getting upset" that occurred PTA. Pt states she was "arguing with my mother" when her symptoms began. Pt describes her symptoms as having "palpitations," and vague generalized chest "pain."  This was associated with "feeling dizzy." Pt states she is starting to feel better since arrival to the ED. Denies back pain, no N/V/D, no abd pain, no focal motor weakness, no tingling/numbness in extremities.     Past Medical History  Diagnosis Date  . Hypertension   . Depression   . Schizoid personality disorder    Past Surgical History  Procedure Laterality Date  . 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 Smoking status: Current Every Day Smoker -- 25.00 packs/day  . Smokeless tobacco: Not on file  . Alcohol Use: No    Review of Systems ROS: Statement: All systems negative except as marked or noted in the HPI; Constitutional: Negative for fever and chills. ; ; Eyes: Negative for eye pain, redness and discharge. ; ; ENMT: Negative for ear pain, hoarseness, nasal congestion, sinus pressure and sore throat. ; ; Cardiovascular: +palpitations, CP. Negative for diaphoresis, dyspnea and peripheral edema. ; ; Respiratory: Negative for cough, wheezing and stridor. ; ; Gastrointestinal: Negative for nausea, vomiting, diarrhea, abdominal pain, blood in stool, hematemesis, jaundice and rectal bleeding. . ; ; Genitourinary: Negative for dysuria, flank pain and hematuria. ; ; Musculoskeletal: Negative for back pain and neck pain. Negative for swelling and trauma.; ; Skin: Negative for pruritus, rash, abrasions, blisters, bruising and skin lesion.; ; Neuro:  +"dizziness." Negative for headache, lightheadedness and neck stiffness. Negative for weakness, altered level of consciousness , altered mental status, extremity weakness, paresthesias, involuntary movement, seizure and syncope.; Psych:  No SI, no SA, no HI, no hallucinations.        Allergies  Review of patient's allergies indicates no known allergies.  Home Medications   Current Outpatient Rx  Name  Route  Sig  Dispense  Refill  . acetaminophen (TYLENOL) 325 MG tablet   Oral   Take 650 mg by mouth every 6 (six) hours as needed for pain.          BP 174/120  Pulse 102  Temp(Src) 98.4 F (36.9 C) (Oral)  Resp 31  SpO2 100% BP 169/106  Pulse 94  Temp(Src) 98.8 F (37.1 C) (Oral)  Resp 26  SpO2 99%  Physical Exam 1225: Physical examination:  Nursing notes reviewed; Vital signs and O2 SAT reviewed;  Constitutional: Well developed, Well nourished, Well hydrated, In no acute distress; Head:  Normocephalic, atraumatic; Eyes: EOMI, PERRL, No scleral icterus; ENMT: Mouth and pharynx normal, Mucous membranes moist; Neck: Supple, Full range of motion, No lymphadenopathy; Cardiovascular: Tachycardic rate and rhythm, No gallop; Respiratory: Breath sounds clear & equal bilaterally, No rales, rhonchi, wheezes.  Speaking full sentences with ease, Normal respiratory effort/excursion; Chest: Nontender, Movement normal; Abdomen: Soft, Nontender, Nondistended, Normal bowel sounds; Genitourinary: No CVA tenderness; Extremities: Pulses normal, No tenderness, No edema, No calf edema or asymmetry.; Neuro: AA&Ox3, Major CN grossly intact.  Speech clear. No gross  focal motor or sensory deficits in extremities.; Skin: Color normal, Warm, Dry.; Psych:  Anxious, tearful.    ED Course   Procedures     MDM  MDM Reviewed: previous chart, nursing note and vitals Reviewed previous: labs and ECG Interpretation: labs and ECG    Date: 08/09/2013 on arrival  Rate: 133  Rhythm: sinus tachycardia   QRS Axis: left  Intervals: QT prolonged, QTc 568  ST/T Wave abnormalities: normal  Conduction Disutrbances:none  Narrative Interpretation:   Old EKG Reviewed: changes noted; prolonged QTc compared to previous EKG dated 04/28/2009 (QTc 482).   Date: 08/09/2013 repeat  Rate: 88  Rhythm: normal sinus rhythm  QRS Axis: normal  Intervals: QT prolonged, QTc 481  ST/T Wave abnormalities: normal  Conduction Disutrbances:none  Narrative Interpretation:   Old EKG Reviewed: changes noted, improved QTc from previous EKG today; unchanged EKG from previous dated 04/28/2009 (QTc 482).  Results for orders placed during the hospital encounter of 08/09/13  URINALYSIS, ROUTINE W REFLEX MICROSCOPIC      Result Value Range   Color, Urine YELLOW  YELLOW   APPearance CLEAR  CLEAR   Specific Gravity, Urine 1.017  1.005 - 1.030   pH 6.0  5.0 - 8.0   Glucose, UA NEGATIVE  NEGATIVE mg/dL   Hgb urine dipstick NEGATIVE  NEGATIVE   Bilirubin Urine NEGATIVE  NEGATIVE   Ketones, ur NEGATIVE  NEGATIVE mg/dL   Protein, ur 30 (*) NEGATIVE mg/dL   Urobilinogen, UA 1.0  0.0 - 1.0 mg/dL   Nitrite NEGATIVE  NEGATIVE   Leukocytes, UA NEGATIVE  NEGATIVE  PREGNANCY, URINE      Result Value Range   Preg Test, Ur NEGATIVE  NEGATIVE  D-DIMER, QUANTITATIVE      Result Value Range   D-Dimer, Quant <0.27  0.00 - 0.48 ug/mL-FEU  MAGNESIUM      Result Value Range   Magnesium 2.0  1.5 - 2.5 mg/dL  URINE MICROSCOPIC-ADD ON      Result Value Range   Squamous Epithelial / LPF RARE  RARE   Bacteria, UA RARE  RARE  POCT I-STAT, CHEM 8      Result Value Range   Sodium 142  135 - 145 mEq/L   Potassium 3.9  3.5 - 5.1 mEq/L   Chloride 108  96 - 112 mEq/L   BUN 11  6 - 23 mg/dL   Creatinine, Ser 1.61  0.50 - 1.10 mg/dL   Glucose, Bld 95  70 - 99 mg/dL   Calcium, Ion 0.96  0.45 - 1.23 mmol/L   TCO2 25  0 - 100 mmol/L   Hemoglobin 12.6  12.0 - 15.0 g/dL   HCT 40.9  81.1 - 91.4 %    1430:  States she feels "better now"  after the ativan and wants to go home. HR improved. Pt no longer appears anxious and tearful. Hx of HTN, not taking meds; strongly encouraged to f/u with PMD regarding same.  Dx and testing d/w pt.  Questions answered.  Verb understanding, agreeable to d/c home with outpt f/u.            Laray Anger, DO 08/11/13 2103

## 2014-04-13 ENCOUNTER — Encounter (HOSPITAL_COMMUNITY): Payer: Self-pay | Admitting: Emergency Medicine

## 2014-04-13 ENCOUNTER — Emergency Department (HOSPITAL_COMMUNITY): Payer: Medicaid Other

## 2014-04-13 ENCOUNTER — Emergency Department (HOSPITAL_COMMUNITY)
Admission: EM | Admit: 2014-04-13 | Discharge: 2014-04-13 | Disposition: A | Discharge status: Home / Self Care | Payer: Medicaid Other | Attending: Emergency Medicine | Admitting: Emergency Medicine

## 2014-04-13 DIAGNOSIS — Z3202 Encounter for pregnancy test, result negative: Secondary | ICD-10-CM | POA: Insufficient documentation

## 2014-04-13 DIAGNOSIS — F17200 Nicotine dependence, unspecified, uncomplicated: Secondary | ICD-10-CM | POA: Insufficient documentation

## 2014-04-13 DIAGNOSIS — R635 Abnormal weight gain: Secondary | ICD-10-CM | POA: Insufficient documentation

## 2014-04-13 DIAGNOSIS — I10 Essential (primary) hypertension: Secondary | ICD-10-CM | POA: Insufficient documentation

## 2014-04-13 DIAGNOSIS — Z8659 Personal history of other mental and behavioral disorders: Secondary | ICD-10-CM | POA: Insufficient documentation

## 2014-04-13 LAB — BASIC METABOLIC PANEL
BUN: 9 mg/dL (ref 6–23)
CHLORIDE: 105 meq/L (ref 96–112)
CO2: 23 mEq/L (ref 19–32)
CREATININE: 0.82 mg/dL (ref 0.50–1.10)
Calcium: 8.9 mg/dL (ref 8.4–10.5)
GFR calc non Af Amer: >90 mL/min (ref >90)
Glucose, Bld: 80 mg/dL (ref 70–99)
POTASSIUM: 3.7 meq/L (ref 3.7–5.3)
Sodium: 141 mEq/L (ref 137–147)

## 2014-04-13 LAB — URINE MICROSCOPIC-ADD ON

## 2014-04-13 LAB — PREGNANCY, URINE: Preg Test, Ur: NEGATIVE

## 2014-04-13 LAB — URINALYSIS, ROUTINE W REFLEX MICROSCOPIC
Bilirubin Urine: NEGATIVE
Glucose, UA: NEGATIVE mg/dL
KETONES UR: NEGATIVE mg/dL
LEUKOCYTES UA: NEGATIVE
NITRITE: NEGATIVE
PH: 7 (ref 5.0–8.0)
Protein, ur: NEGATIVE mg/dL
Specific Gravity, Urine: 1.015 (ref 1.005–1.030)
Urobilinogen, UA: 1 mg/dL (ref 0.0–1.0)

## 2014-04-13 MED ORDER — HYDROCHLOROTHIAZIDE 25 MG PO TABS: 25.0000 mg | ORAL_TABLET | Freq: Every day | ORAL | Status: DC

## 2014-04-13 NOTE — ED Provider Notes (Signed)
CSN: [id number]     Arrival date & time 04/13/14  1051 History   First MD Initiated Contact with Patient 04/13/14 1118     Chief Complaint  Patient presents with  . Hypertension     (Consider location/radiation/quality/duration/timing/severity/associated sxs/prior Treatment) Patient is a 28 y.o. female presenting with hypertension.  Hypertension   Pt with history of HTN has not been on meds for several years due to losing insurance reports she went to [facility] to have Implanon removed today. Her BP was elevated there and they were unable to find her Implanon. Pt states she has gained weight over the years since it was inserted. She was advised to come to the ED for evaluation of HTN and for an xray to locate Implanon. She was also apparently told we would remove it for her. Pt denies any HTN related complaints.   Past Medical History  Diagnosis Date  . Hypertension   . Depression   . Schizoid personality disorder    Past Surgical History  Procedure Laterality Date  . 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 No pertinent family history. History  Substance Use Topics  . Smoking status: Current Every Day Smoker -- 25.00 packs/day  . Smokeless tobacco: Not on file  . Alcohol Use: No   OB History   Grav Para Term Preterm Abortions TAB SAB Ect Mult Living                 Review of Systems All other systems reviewed and are negative except as noted in HPI.     Allergies  Review of patient's allergies indicates no known allergies.  Home Medications   Prior to Admission medications   Medication Sig Start Date End Date Taking? Authorizing Provider  acetaminophen (TYLENOL) 325 MG tablet Take 650 mg by mouth every 6 (six) hours as needed for pain.   Yes Historical Provider, MD  etonogestrel (IMPLANON) 68 MG IMPL implant Inject 1 each into the skin once.   Yes Historical Provider, MD   BP 195/135  Pulse 92  Temp(Src)  98.6 F (37 C) (Oral)  Resp 18  Ht 5\' 8"  (1.727 m)  Wt 233 lb (105.688 kg)  BMI 35.44 kg/m2  SpO2 100% Physical Exam  Nursing note and vitals reviewed. Constitutional: She is oriented to person, place, and time. She appears well-developed and well-nourished.  HENT:  Head: Normocephalic and atraumatic.  Eyes: EOM are normal. Pupils are equal, round, and reactive to light.  Neck: Normal range of motion. Neck supple.  Cardiovascular: Normal rate, normal heart sounds and intact distal pulses.   Pulmonary/Chest: Effort normal and breath sounds normal.  Abdominal: Bowel sounds are normal. She exhibits no distension. There is no tenderness.  Musculoskeletal: Normal range of motion. She exhibits no edema and no tenderness.  Neurological: She is alert and oriented to person, place, and time. She has normal strength. No cranial nerve deficit or sensory deficit.  Skin: Skin is warm and dry. No rash noted.  Psychiatric: She has a normal mood and affect.    ED Course  Procedures (including critical care time) Labs Review Labs Reviewed  URINALYSIS, ROUTINE W REFLEX MICROSCOPIC - Abnormal; Notable for the following:    Hgb urine dipstick SMALL (*)    All other components within normal limits  BASIC METABOLIC PANEL  PREGNANCY, URINE  URINE MICROSCOPIC-ADD ON    Imaging Review Dg Humerus Left  04/13/2014  CLINICAL DATA:  Assess for foreign body location. Birth control implant.  EXAM: LEFT HUMERUS - 2+ VIEW  COMPARISON:  None.  FINDINGS: No implantable foreign device visualized. Linear density noted projecting over the left lateral chest wall/breast, felt to be a wire within the patient's brought. Recommend clinical correlation. No acute bony abnormality. No fracture, subluxation or dislocation.  IMPRESSION: No visible radiopaque implanted device in the left upper arm.   Electronically Signed   By: Rolm Baptise M.D.   On: 04/13/2014 12:43     EKG Interpretation   Date/Time:  Wednesday April 13 2014 12:02:16 EDT Ventricular Rate:  85 PR Interval:  150 QRS Duration: 92 QT Interval:  410 QTC Calculation: 487 R Axis:   6 Text Interpretation:  Sinus rhythm Borderline T abnormalities, inferior  leads Borderline prolonged QT interval Since last tracing Nonspecific T  wave abnormality NOW PRESENT Confirmed by Karle Starch  MD, Juanda Crumble [phone]) on  04/13/2014 12:43:33 PM      MDM   Final diagnoses:  Hypertension    No evidence of end organ damage on lab or EKG eval. Xray of LUE neg for implanon. Pt is adamant that it was placed in that arm and does not want additional xrays to see if it is in the other arm. Advised followup with Gyn. Will start HCTZ for BP control.     Charles B. Karle Starch, MD 04/13/14 1250

## 2014-04-13 NOTE — Discharge Planning (Signed)
[id number] Felicia E, Community Liaison  Spoke to patient about primary care resources and establishing care with a provider. Patient was given the orange card application and was instructed to contact me once the application was completed for an appointment. Patient was given several community resources and a primary Warden/ranger. My contact information was also provided for any future questions or concerns.

## 2014-04-13 NOTE — ED Notes (Addendum)
She went to get her implanon out of her arm today and the doctor was not able to find the implanon in her arm to remove it. She was told to come to the ED for removal of the implanon and also for evaluation of high BP in the doctors office today. shes been out of her bp meds for 3 years

## 2014-04-13 NOTE — ED Notes (Signed)
MD at bedside. 

## 2014-04-13 NOTE — Discharge Instructions (Signed)

## 2014-04-13 NOTE — ED Notes (Signed)
Returned from xray

## 2014-04-13 NOTE — ED Notes (Signed)
Patient transported to X-ray 

## 2014-05-25 ENCOUNTER — Ambulatory Visit (INDEPENDENT_AMBULATORY_CARE_PROVIDER_SITE_OTHER): Payer: Self-pay | Admitting: Surgery

## 2015-01-20 ENCOUNTER — Other Ambulatory Visit (INDEPENDENT_AMBULATORY_CARE_PROVIDER_SITE_OTHER): Payer: Self-pay | Admitting: Surgery

## 2015-01-20 DIAGNOSIS — Z3046 Encounter for surveillance of implantable subdermal contraceptive: Secondary | ICD-10-CM

## 2015-01-23 ENCOUNTER — Ambulatory Visit
Admission: RE | Admit: 2015-01-23 | Discharge: 2015-01-23 | Disposition: A | Discharge status: Home / Self Care | Payer: Medicaid Other | Source: Ambulatory Visit | Attending: Surgery | Admitting: Surgery

## 2015-01-23 ENCOUNTER — Other Ambulatory Visit: Payer: Self-pay | Admitting: Surgery

## 2015-01-23 ENCOUNTER — Other Ambulatory Visit (INDEPENDENT_AMBULATORY_CARE_PROVIDER_SITE_OTHER): Payer: Self-pay

## 2015-01-23 DIAGNOSIS — S40859S Superficial foreign body of unspecified upper arm, sequela: Secondary | ICD-10-CM

## 2015-02-03 ENCOUNTER — Encounter (HOSPITAL_BASED_OUTPATIENT_CLINIC_OR_DEPARTMENT_OTHER): Payer: Self-pay | Admitting: *Deleted

## 2015-02-03 NOTE — Pre-Procedure Instructions (Signed)
To come for BMET 

## 2015-02-08 ENCOUNTER — Encounter (HOSPITAL_BASED_OUTPATIENT_CLINIC_OR_DEPARTMENT_OTHER)
Admission: RE | Admit: 2015-02-08 | Discharge: 2015-02-08 | Disposition: A | Discharge status: Home / Self Care | Payer: Medicaid Other | Source: Ambulatory Visit | Attending: Surgery | Admitting: Surgery

## 2015-02-08 DIAGNOSIS — I10 Essential (primary) hypertension: Secondary | ICD-10-CM | POA: Diagnosis not present

## 2015-02-08 DIAGNOSIS — F601 Schizoid personality disorder: Secondary | ICD-10-CM | POA: Diagnosis not present

## 2015-02-08 DIAGNOSIS — Z3049 Encounter for surveillance of other contraceptives: Secondary | ICD-10-CM | POA: Diagnosis present

## 2015-02-08 DIAGNOSIS — F17210 Nicotine dependence, cigarettes, uncomplicated: Secondary | ICD-10-CM | POA: Diagnosis not present

## 2015-02-08 LAB — BASIC METABOLIC PANEL
ANION GAP: 5 (ref 5–15)
BUN: 9 mg/dL (ref 6–23)
CHLORIDE: 106 mmol/L (ref 96–112)
CO2: 28 mmol/L (ref 19–32)
Calcium: 9 mg/dL (ref 8.4–10.5)
Creatinine, Ser: 0.87 mg/dL (ref 0.50–1.10)
GFR calc Af Amer: >90 mL/min (ref >90)
GFR calc non Af Amer: 89 mL/min — ABNORMAL LOW (ref >90)
Glucose, Bld: 108 mg/dL — ABNORMAL HIGH (ref 70–99)
Potassium: 3.5 mmol/L (ref 3.5–5.1)
Sodium: 139 mmol/L (ref 135–145)

## 2015-02-10 ENCOUNTER — Ambulatory Visit (HOSPITAL_BASED_OUTPATIENT_CLINIC_OR_DEPARTMENT_OTHER): Payer: Medicaid Other | Admitting: Anesthesiology

## 2015-02-10 ENCOUNTER — Ambulatory Visit (HOSPITAL_BASED_OUTPATIENT_CLINIC_OR_DEPARTMENT_OTHER)
Admission: RE | Admit: 2015-02-10 | Discharge: 2015-02-10 | Disposition: A | Discharge status: Home / Self Care | Payer: Medicaid Other | Source: Ambulatory Visit | Attending: Surgery | Admitting: Surgery

## 2015-02-10 ENCOUNTER — Other Ambulatory Visit (INDEPENDENT_AMBULATORY_CARE_PROVIDER_SITE_OTHER): Payer: Self-pay | Admitting: *Deleted

## 2015-02-10 ENCOUNTER — Encounter (HOSPITAL_BASED_OUTPATIENT_CLINIC_OR_DEPARTMENT_OTHER)
Admission: RE | Disposition: A | Discharge status: Home / Self Care | Payer: Self-pay | Source: Ambulatory Visit | Attending: Surgery

## 2015-02-10 ENCOUNTER — Other Ambulatory Visit (INDEPENDENT_AMBULATORY_CARE_PROVIDER_SITE_OTHER): Payer: Self-pay | Admitting: Surgery

## 2015-02-10 ENCOUNTER — Encounter (HOSPITAL_BASED_OUTPATIENT_CLINIC_OR_DEPARTMENT_OTHER): Payer: Self-pay | Admitting: Anesthesiology

## 2015-02-10 DIAGNOSIS — I10 Essential (primary) hypertension: Secondary | ICD-10-CM | POA: Insufficient documentation

## 2015-02-10 DIAGNOSIS — Z3049 Encounter for surveillance of other contraceptives: Secondary | ICD-10-CM | POA: Diagnosis not present

## 2015-02-10 DIAGNOSIS — F601 Schizoid personality disorder: Secondary | ICD-10-CM | POA: Insufficient documentation

## 2015-02-10 DIAGNOSIS — M795 Residual foreign body in soft tissue: Secondary | ICD-10-CM

## 2015-02-10 DIAGNOSIS — F17210 Nicotine dependence, cigarettes, uncomplicated: Secondary | ICD-10-CM | POA: Diagnosis not present

## 2015-02-10 HISTORY — PX: FOREIGN BODY REMOVAL: SHX962

## 2015-02-10 SURGERY — REMOVAL FOREIGN BODY EXTREMITY
Anesthesia: Monitor Anesthesia Care | Site: Arm Upper | Laterality: Left

## 2015-02-10 MED ORDER — BUPIVACAINE HCL (PF) 0.5 % IJ SOLN
INTRAMUSCULAR | Status: DC | PRN
  Administered 2015-02-10: 8 mL

## 2015-02-10 MED ORDER — BUPIVACAINE HCL (PF) 0.5 % IJ SOLN
INTRAMUSCULAR | Status: AC
  Filled 2015-02-10: qty 30

## 2015-02-10 MED ORDER — MIDAZOLAM HCL 2 MG/2ML IJ SOLN: 1.0000 mg | INTRAMUSCULAR | Status: DC | PRN

## 2015-02-10 MED ORDER — FENTANYL CITRATE 0.05 MG/ML IJ SOLN
INTRAMUSCULAR | Status: AC
  Filled 2015-02-10: qty 6

## 2015-02-10 MED ORDER — OXYCODONE HCL 5 MG PO TABS
ORAL_TABLET | ORAL | Status: AC
  Filled 2015-02-10: qty 1

## 2015-02-10 MED ORDER — LIDOCAINE HCL 1 % IJ SOLN
INTRAMUSCULAR | Status: DC | PRN
  Administered 2015-02-10: 80 mg via INTRADERMAL

## 2015-02-10 MED ORDER — PROPOFOL 10 MG/ML IV BOLUS
INTRAVENOUS | Status: DC | PRN
  Administered 2015-02-10 (×2): 50 mg via INTRAVENOUS
  Administered 2015-02-10: 200 mg via INTRAVENOUS

## 2015-02-10 MED ORDER — OXYCODONE HCL 5 MG PO TABS
5.0000 mg | ORAL_TABLET | Freq: Once | ORAL | Status: AC | PRN
  Administered 2015-02-10: 5 mg via ORAL

## 2015-02-10 MED ORDER — MIDAZOLAM HCL 5 MG/5ML IJ SOLN
INTRAMUSCULAR | Status: DC | PRN
  Administered 2015-02-10: 2 mg via INTRAVENOUS

## 2015-02-10 MED ORDER — CEFAZOLIN SODIUM-DEXTROSE 2-3 GM-% IV SOLR
INTRAVENOUS | Status: DC | PRN
  Administered 2015-02-10: 2 g via INTRAVENOUS

## 2015-02-10 MED ORDER — LACTATED RINGERS IV SOLN
INTRAVENOUS | Status: DC
  Administered 2015-02-10: 11:00:00 via INTRAVENOUS

## 2015-02-10 MED ORDER — FENTANYL CITRATE 0.05 MG/ML IJ SOLN
25.0000 ug | INTRAMUSCULAR | Status: DC | PRN
  Administered 2015-02-10 (×2): 50 ug via INTRAVENOUS

## 2015-02-10 MED ORDER — MIDAZOLAM HCL 2 MG/ML PO SYRP: 12.0000 mg | ORAL_SOLUTION | Freq: Once | ORAL | Status: DC | PRN

## 2015-02-10 MED ORDER — FENTANYL CITRATE 0.05 MG/ML IJ SOLN: 50.0000 ug | INTRAMUSCULAR | Status: DC | PRN

## 2015-02-10 MED ORDER — FENTANYL CITRATE 0.05 MG/ML IJ SOLN
INTRAMUSCULAR | Status: DC | PRN
  Administered 2015-02-10 (×2): 50 ug via INTRAVENOUS
  Administered 2015-02-10: 25 ug via INTRAVENOUS
  Administered 2015-02-10: 50 ug via INTRAVENOUS
  Administered 2015-02-10: 25 ug via INTRAVENOUS
  Administered 2015-02-10: 50 ug via INTRAVENOUS

## 2015-02-10 MED ORDER — FENTANYL CITRATE 0.05 MG/ML IJ SOLN
INTRAMUSCULAR | Status: AC
Start: 2015-02-10 — End: 2015-02-10
  Filled 2015-02-10: qty 2

## 2015-02-10 MED ORDER — OXYCODONE HCL 5 MG/5ML PO SOLN: 5.0000 mg | Freq: Once | ORAL | Status: AC | PRN

## 2015-02-10 MED ORDER — HYDROCODONE-ACETAMINOPHEN 5-325 MG PO TABS: 1.0000 | ORAL_TABLET | ORAL | Status: DC | PRN

## 2015-02-10 MED ORDER — MIDAZOLAM HCL 2 MG/2ML IJ SOLN
INTRAMUSCULAR | Status: AC
  Filled 2015-02-10: qty 2

## 2015-02-10 MED ORDER — ONDANSETRON HCL 4 MG/2ML IJ SOLN: 4.0000 mg | Freq: Four times a day (QID) | INTRAMUSCULAR | Status: DC | PRN

## 2015-02-10 MED ORDER — DEXAMETHASONE SODIUM PHOSPHATE 4 MG/ML IJ SOLN
INTRAMUSCULAR | Status: DC | PRN
  Administered 2015-02-10: 10 mg via INTRAVENOUS

## 2015-02-10 SURGICAL SUPPLY — 54 items
APL SKNCLS STERI-STRIP NONHPOA (GAUZE/BANDAGES/DRESSINGS)
BANDAGE ELASTIC 4 VELCRO ST LF (GAUZE/BANDAGES/DRESSINGS) ×3 IMPLANT
BENZOIN TINCTURE PRP APPL 2/3 (GAUZE/BANDAGES/DRESSINGS) IMPLANT
BLADE CLIPPER SURG (BLADE) IMPLANT
BLADE SURG 15 STRL LF DISP TIS (BLADE) ×2 IMPLANT
BLADE SURG 15 STRL SS (BLADE) ×4
BNDG GAUZE ELAST 4 BULKY (GAUZE/BANDAGES/DRESSINGS) ×3 IMPLANT
CANISTER SUCT 1200ML W/VALVE (MISCELLANEOUS) IMPLANT
CLEANER CAUTERY TIP 5X5 PAD (MISCELLANEOUS) ×2 IMPLANT
CLOSURE WOUND 1/2 X4 (GAUZE/BANDAGES/DRESSINGS)
COVER BACK TABLE 60X90IN (DRAPES) ×4 IMPLANT
COVER MAYO STAND STRL (DRAPES) ×4 IMPLANT
DECANTER SPIKE VIAL GLASS SM (MISCELLANEOUS) IMPLANT
DRAPE LAPAROTOMY 100X72 PEDS (DRAPES) IMPLANT
DRAPE U-SHAPE 76X120 STRL (DRAPES) ×3 IMPLANT
DRSG TEGADERM 2-3/8X2-3/4 SM (GAUZE/BANDAGES/DRESSINGS) IMPLANT
ELECT REM PT RETURN 9FT ADLT (ELECTROSURGICAL) ×4
ELECTRODE REM PT RTRN 9FT ADLT (ELECTROSURGICAL) ×2 IMPLANT
GLOVE BIO SURGEON STRL SZ8 (GLOVE) ×4 IMPLANT
GLOVE BIOGEL PI IND STRL 7.5 (GLOVE) ×1 IMPLANT
GLOVE BIOGEL PI INDICATOR 7.5 (GLOVE) ×2
GLOVE SURG SS PI 7.5 STRL IVOR (GLOVE) ×3 IMPLANT
GOWN STRL REUS W/ TWL LRG LVL3 (GOWN DISPOSABLE) ×2 IMPLANT
GOWN STRL REUS W/ TWL XL LVL3 (GOWN DISPOSABLE) ×2 IMPLANT
GOWN STRL REUS W/TWL LRG LVL3 (GOWN DISPOSABLE) ×4
GOWN STRL REUS W/TWL XL LVL3 (GOWN DISPOSABLE) ×4
LIQUID BAND (GAUZE/BANDAGES/DRESSINGS) ×3 IMPLANT
NDL HYPO 25X1 1.5 SAFETY (NEEDLE) ×1 IMPLANT
NDL PRECISIONGLIDE 27X1.5 (NEEDLE) IMPLANT
NEEDLE HYPO 25X1 1.5 SAFETY (NEEDLE) ×4 IMPLANT
NEEDLE PRECISIONGLIDE 27X1.5 (NEEDLE) IMPLANT
NS IRRIG 1000ML POUR BTL (IV SOLUTION) IMPLANT
PACK BASIN DAY SURGERY FS (CUSTOM PROCEDURE TRAY) ×4 IMPLANT
PAD CLEANER CAUTERY TIP 5X5 (MISCELLANEOUS) ×2
PENCIL BUTTON HOLSTER BLD 10FT (ELECTRODE) ×4 IMPLANT
SPONGE GAUZE 2X2 8PLY STER LF (GAUZE/BANDAGES/DRESSINGS) ×2
SPONGE GAUZE 2X2 8PLY STRL LF (GAUZE/BANDAGES/DRESSINGS) ×6 IMPLANT
SPONGE GAUZE 4X4 12PLY STER LF (GAUZE/BANDAGES/DRESSINGS) ×4 IMPLANT
STRIP CLOSURE SKIN 1/2X4 (GAUZE/BANDAGES/DRESSINGS) IMPLANT
SUT ETHILON 3 0 FSL (SUTURE) IMPLANT
SUT ETHILON 5 0 PS 2 18 (SUTURE) IMPLANT
SUT VIC AB 3-0 SH 27 (SUTURE)
SUT VIC AB 3-0 SH 27X BRD (SUTURE) IMPLANT
SUT VIC AB 4-0 SH 18 (SUTURE) ×4 IMPLANT
SUT VIC AB 5-0 PS2 18 (SUTURE) IMPLANT
SUT VICRYL 3-0 CR8 SH (SUTURE) IMPLANT
SYR BULB 3OZ (MISCELLANEOUS) ×4 IMPLANT
SYR CONTROL 10ML LL (SYRINGE) ×4 IMPLANT
TOWEL OR 17X24 6PK STRL BLUE (TOWEL DISPOSABLE) ×4 IMPLANT
TRAY DSU PREP LF (CUSTOM PROCEDURE TRAY) ×4 IMPLANT
TUBE CONNECTING 20'X1/4 (TUBING)
TUBE CONNECTING 20X1/4 (TUBING) IMPLANT
UNDERPAD 30X30 INCONTINENT (UNDERPADS AND DIAPERS) IMPLANT
YANKAUER SUCT BULB TIP NO VENT (SUCTIONS) IMPLANT

## 2015-02-10 NOTE — Anesthesia Procedure Notes (Signed)
Procedure Name: LMA Insertion Date/Time: 02/10/2015 12:26 PM Performed by: Toula Moos L Pre-anesthesia Checklist: Patient identified, Emergency Drugs available, Suction available, Patient being monitored and Timeout performed Patient Re-evaluated:Patient Re-evaluated prior to inductionOxygen Delivery Method: Circle System Utilized Preoxygenation: Pre-oxygenation with 100% oxygen Intubation Type: IV induction Ventilation: Mask ventilation without difficulty LMA: LMA inserted LMA Size: 4.0 Number of attempts: 1 Airway Equipment and Method: Bite block Placement Confirmation: positive ETCO2 Tube secured with: Tape Dental Injury: Teeth and Oropharynx as per pre-operative assessment

## 2015-02-10 NOTE — Transfer of Care (Signed)
Immediate Anesthesia Transfer of Care Note  Patient: Robin Guerrero  Procedure(s) Performed: Procedure(s): ATTEMPTED EXPLANTATION OF BIRTH CONTROL DEVICE LEFT ARM (Left)  Patient Location: PACU  Anesthesia Type:General  Level of Consciousness: awake and patient cooperative  Airway & Oxygen Therapy: Patient Spontanous Breathing and Patient connected to face mask oxygen  Post-op Assessment: Report given to RN and Post -op Vital signs reviewed and stable  Post vital signs: Reviewed and stable  Last Vitals:  Filed Vitals:   02/10/15 1402  BP:   Pulse: 105  Temp:   Resp: 15    Complications: No apparent anesthesia complications

## 2015-02-10 NOTE — Brief Op Note (Signed)
02/10/2015  2:08 PM  PATIENT:  Robin Guerrero  29 y.o. female  PRE-OPERATIVE DIAGNOSIS:  RETAINED BIRTH CONTROL DEVICE  POST-OPERATIVE DIAGNOSIS:  RETAINED BIRTH CONTROL DEVICE  PROCEDURE:  Procedure(s): ATTEMPTED EXPLANTATION OF BIRTH CONTROL DEVICE LEFT ARM (Left)  SURGEON:  Surgeon(s) and Role:    * Pedro Earls, MD - Primary  PHYSICIAN ASSISTANT:   ASSISTANTS: none   ANESTHESIA:   general  EBL:  Total I/O In: 1800 [I.V.:1800] Out: -   BLOOD ADMINISTERED:none  DRAINS: none   LOCAL MEDICATIONS USED:  LIDOCAINE   SPECIMEN:  No Specimen  DISPOSITION OF SPECIMEN:  N/A  COUNTS:  YES  TOURNIQUET:  * No tourniquets in log *  DICTATION: .Other Dictation: Dictation Number [id number]  PLAN OF CARE: Discharge to home after PACU  PATIENT DISPOSITION:  PACU - hemodynamically stable.   Delay start of Pharmacological VTE agent (>24hrs) due to surgical blood loss or risk of bleeding: not applicable

## 2015-02-10 NOTE — Anesthesia Preprocedure Evaluation (Signed)
Anesthesia Evaluation  Patient identified by MRN, date of birth, ID band Patient awake    Reviewed: Allergy & Precautions, NPO status , Patient's Chart, lab work & pertinent test results  Airway Mallampati: II   Neck ROM: full    Dental   Pulmonary Current Smoker,  breath sounds clear to auscultation        Cardiovascular hypertension, Rhythm:regular Rate:Normal     Neuro/Psych    GI/Hepatic   Endo/Other    Renal/GU      Musculoskeletal   Abdominal   Peds  Hematology   Anesthesia Other Findings   Reproductive/Obstetrics                             Anesthesia Physical Anesthesia Plan  ASA: II  Anesthesia Plan: MAC   Post-op Pain Management:    Induction: Intravenous  Airway Management Planned: Simple Face Mask  Additional Equipment:   Intra-op Plan:   Post-operative Plan:   Informed Consent: I have reviewed the patients History and Physical, chart, labs and discussed the procedure including the risks, benefits and alternatives for the proposed anesthesia with the patient or authorized representative who has indicated his/her understanding and acceptance.     Plan Discussed with: CRNA, Anesthesiologist and Surgeon  Anesthesia Plan Comments:         Anesthesia Quick Evaluation

## 2015-02-10 NOTE — Anesthesia Postprocedure Evaluation (Signed)
  Anesthesia Post-op Note  Patient: Robin Guerrero  Procedure(s) Performed: Procedure(s): ATTEMPTED EXPLANTATION OF BIRTH CONTROL DEVICE LEFT ARM (Left)  Patient Location: PACU  Anesthesia Type:MAC  Level of Consciousness: awake and alert   Airway and Oxygen Therapy: Patient Spontanous Breathing  Post-op Pain: none  Post-op Assessment: Post-op Vital signs reviewed  Post-op Vital Signs: stable  Last Vitals:  Filed Vitals:   02/10/15 1505  BP: 145/89  Pulse: 91  Temp: 36.9 C  Resp: 18    Complications: No apparent anesthesia complications

## 2015-02-10 NOTE — H&P (Signed)
Chief Complaint:  Implanted birth control device in left arm medially  History of Present Illness:  Robin Guerrero is an 29 y.o. female who wants bcd removed.  This was attempted in the birth control clinic without success.  I explained that since we cannot see if with xray that it may be that I cannot remove this.  She is aware.    Past Medical History  Diagnosis Date  . Schizoid personality disorder   . Hypertension     states under control with med., has been on med. x 3 yr.    Past Surgical History  Procedure Laterality Date  . Submandibular gland excision Right 05/01/2009    Current Facility-Administered Medications  Medication Dose Route Frequency Provider Last Rate Last Dose  . fentaNYL (SUBLIMAZE) injection 50-100 mcg  50-100 mcg Intravenous PRN Arabella Merles, MD      . lactated ringers infusion   Intravenous Continuous Arabella Merles, MD 10 mL/hr at 02/10/15 1121    . midazolam (VERSED) 2 MG/ML syrup 12 mg  12 mg Oral Once PRN Arabella Merles, MD      . midazolam (VERSED) injection 1-2 mg  1-2 mg Intravenous PRN Arabella Merles, MD       Review of patient's allergies indicates no known allergies. History reviewed. No pertinent family history. Social History:   reports that she has been smoking Cigarettes.  She has smoked for the past 5 years. She has never used smokeless tobacco. She reports that she drinks alcohol. She reports that she does not use illicit drugs.   REVIEW OF SYSTEMS : Negative except for see problem list  Physical Exam:   Blood pressure 134/93, pulse 100, temperature 98.9 F (37.2 C), temperature source Oral, resp. rate 20, height _0  (1.727 m), weight 231 lb (104.781 kg), last menstrual period 01/20/2015, SpO2 100 %. Body mass index is 35.13 kg/(m^2).  Gen:  WDWN AAF NAD  Neurological: Alert and oriented to person, place, and time. Motor and sensory function is grossly intact  Head: Normocephalic and atraumatic.  Eyes:  Conjunctivae are normal. Pupils are equal, round, and reactive to light. No scleral icterus.  Neck: Normal range of motion. Neck supple. No tracheal deviation or thyromegaly present.  Cardiovascular:  SR without murmurs or gallops.  No carotid bruits Breast:  Not examined Respiratory: Effort normal.  No respiratory distress. No chest wall tenderness. Breath sounds normal.  No wheezes, rales or rhonchi.  Abdomen:  Not examined GU:  Not examined Musculoskeletal: Normal range of motion. Extremities are nontender. No cyanosis, edema or clubbing noted.  We examined there transverse incision with keloid from the medial aspect of her left arm.  She cannot palpate it and neithercan I.  We marked the suspicious areas and will explore under general Lymphadenopathy: No cervical, preauricular, postauricular or axillary adenopathy is present Skin: Skin is warm and dry. No rash noted. No diaphoresis. No erythema. No pallor. Pscyh: Normal mood and affect. Behavior is normal. Judgment and thought content normal.   LABORATORY RESULTS: Results for orders placed or performed during the hospital encounter of 02/10/15 (from the past 48 hour(s))  Basic metabolic panel     Status: Abnormal   Collection Time: 02/08/15  3:30 PM  Result Value Ref Range   Sodium 139 135 - 145 mmol/L   Potassium 3.5 3.5 - 5.1 mmol/L   Chloride 106 96 - 112 mmol/L   CO2 28 19 - 32 mmol/L   Glucose, Bld 108 (H) 70 -  99 mg/dL   BUN 9 6 - 23 mg/dL   Creatinine, Ser 0.87 0.50 - 1.10 mg/dL   Calcium 9.0 8.4 - 10.5 mg/dL   GFR calc non Af Amer 89 (L) >90 mL/min   GFR calc Af Amer >90 >90 mL/min    Comment: (NOTE) The eGFR has been calculated using the CKD EPI equation. This calculation has not been validated in all clinical situations. eGFR's persistently <90 mL/min signify possible Chronic Kidney Disease.    Anion gap 5 5 - 15     RADIOLOGY RESULTS: No results found.  Problem List: There are no active problems to display for  this patient.   Assessment & Plan: Birth control device (foreign body ) in left medial arm.  For removal.      Matt B. Hassell Done, MD, [facility] [phone] beeper [phone]  02/10/2015 12:14 PM

## 2015-02-10 NOTE — Discharge Instructions (Signed)
Unwrap tomorrow and may shower.   Office to schedule MRI of the left arm for retained Implanton device.      Post Anesthesia Home Care Instructions  Activity: Get plenty of rest for the remainder of the day. A responsible adult should stay with you for 24 hours following the procedure.  For the next 24 hours, DO NOT: -Drive a car -Paediatric nurse -Drink alcoholic beverages -Take any medication unless instructed by your physician -Make any legal decisions or sign important papers.  Meals: Start with liquid foods such as gelatin or soup. Progress to regular foods as tolerated. Avoid greasy, spicy, heavy foods. If nausea and/or vomiting occur, drink only clear liquids until the nausea and/or vomiting subsides. Call your physician if vomiting continues.  Special Instructions/Symptoms: Your throat may feel dry or sore from the anesthesia or the breathing tube placed in your throat during surgery. If this causes discomfort, gargle with warm salt water. The discomfort should disappear within 24 hours.

## 2015-02-13 ENCOUNTER — Encounter (HOSPITAL_BASED_OUTPATIENT_CLINIC_OR_DEPARTMENT_OTHER): Payer: Self-pay | Admitting: Surgery

## 2015-02-13 LAB — POCT HEMOGLOBIN-HEMACUE: Hemoglobin: 13 g/dL (ref 12.0–15.0)

## 2015-02-13 NOTE — Op Note (Signed)
Robin Guerrero, NIFONG NO.:  [id number]  MEDICAL RECORD NO.:  [id number]  LOCATION:                                 FACILITY:  PHYSICIAN:  Isabel Caprice. Hassell Done, MD  DATE OF BIRTH:  01/22/1986  DATE OF PROCEDURE:  02/10/2015 DATE OF DISCHARGE:  02/10/2015                              OPERATIVE REPORT   PREOPERATIVE DIAGNOSIS:  Retained Implanon contraceptive device.  POSTOPERATIVE DIAGNOSIS:  Retained Implanon contraceptive device.  PROCEDURE:  Attempted removal of Implanon contraceptive device.  SURGEON:  Isabel Caprice. Hassell Done, MD  ANESTHESIA:  General by LMA.  DESCRIPTION OF PROCEDURE:  The patient is a 29 year old, African American female, who was sent over from the [facility] after an attempted removal of Implanon was performed.  The patient and I had talked preoperatively about the risk of removal or inability to remove because she could no longer feel it.  I had obtained x-rays which did not show anything.  We have an ultrasound in the operating room, which I had planned to use to attempt removal, but gave her no guarantee that this would be possible.  She did denote an area where she thought that it had been and was the last she felt it and which is sore.  She also had a keloid from the site where they tried to remove it before.  We marked these areas with indelible markers. Patient was taken back to room 80 [facility] and given general anesthesia.  She received a g of Ancef.  The left arm was prepped with PC Max and draped sterilely.  I first went to the area where we had marked and used the ultrasound and thought in that area, I could see some shadowing in the subcutaneous tissue almost near the muscle.  I elected to make a small transverse incision there and through that probed this area down to the muscle fascia.  This did not reveal any evidence of the Implanon device which was described as a 2 mm x 4 cm plastic rod.  I then  went back to her old incision that had the keloid and excised the keloid, then dissected down and inserted my finger into that larger incision.  I was able to connect my finger down to the other incision and using right angle hemostats and my finger and holding and compressing the tissue carefully palpated and could not find this device.  I used the hemostat to probe the grasp and basically de-fatted some of this area.  Tried this multiple attempts and swept around both incisions looking more proximally and more distally, medial lateral, again could not be found.  I irrigated copiously with saline to remove some of the fat and it had been vascularized.  I then went back out and re-thought this and went down below and thought I could feel where I made even smaller nick, tried to see if I get encounter the rod subdermally, but this was not encountered.  The patient's wounds were irrigated copiously and closed with 4-0 Vicryl and Dermabond.  A Kerlix and then Ace wrap were applied.  The patient will be scheduled for an MR of her left arm to  look for the Implanon device.     Isabel Caprice Hassell Done, MD     MBM/MEDQ  D:  02/10/2015  T:  02/11/2015  Job:  [id number]

## 2015-02-13 NOTE — Addendum Note (Signed)
Addendum  created 02/13/15 1258 by Tawni Millers, CRNA   Modules edited: Charges VN

## 2015-02-22 ENCOUNTER — Encounter (HOSPITAL_BASED_OUTPATIENT_CLINIC_OR_DEPARTMENT_OTHER): Payer: Self-pay | Admitting: Surgery

## 2015-02-27 ENCOUNTER — Ambulatory Visit (HOSPITAL_COMMUNITY)
Admission: RE | Admit: 2015-02-27 | Discharge: 2015-02-27 | Disposition: A | Discharge status: Home / Self Care | Payer: Medicaid Other | Source: Ambulatory Visit | Attending: Surgery | Admitting: Surgery

## 2015-02-27 ENCOUNTER — Other Ambulatory Visit: Payer: Self-pay | Admitting: Surgery

## 2015-02-27 DIAGNOSIS — M795 Residual foreign body in soft tissue: Secondary | ICD-10-CM | POA: Insufficient documentation

## 2015-03-08 ENCOUNTER — Other Ambulatory Visit (INDEPENDENT_AMBULATORY_CARE_PROVIDER_SITE_OTHER): Payer: Self-pay

## 2015-04-06 ENCOUNTER — Other Ambulatory Visit (INDEPENDENT_AMBULATORY_CARE_PROVIDER_SITE_OTHER): Payer: Self-pay

## 2015-04-18 ENCOUNTER — Other Ambulatory Visit (INDEPENDENT_AMBULATORY_CARE_PROVIDER_SITE_OTHER): Payer: Self-pay

## 2015-04-19 ENCOUNTER — Other Ambulatory Visit (INDEPENDENT_AMBULATORY_CARE_PROVIDER_SITE_OTHER): Payer: Self-pay

## 2015-04-19 DIAGNOSIS — Z469 Encounter for fitting and adjustment of unspecified device: Secondary | ICD-10-CM

## 2015-04-19 DIAGNOSIS — T859XXA Unspecified complication of internal prosthetic device, implant and graft, initial encounter: Secondary | ICD-10-CM

## 2015-05-08 ENCOUNTER — Encounter (HOSPITAL_BASED_OUTPATIENT_CLINIC_OR_DEPARTMENT_OTHER): Payer: Self-pay | Admitting: *Deleted

## 2015-05-08 NOTE — Pre-Procedure Instructions (Addendum)
To come for BMET and EKG; no EKG at Dr. Merrilee Jansky office, per staff.

## 2015-05-09 ENCOUNTER — Encounter (HOSPITAL_BASED_OUTPATIENT_CLINIC_OR_DEPARTMENT_OTHER)
Admission: RE | Admit: 2015-05-09 | Discharge: 2015-05-09 | Disposition: A | Discharge status: Home / Self Care | Payer: Medicaid Other | Source: Ambulatory Visit | Attending: Surgery | Admitting: Surgery

## 2015-05-09 DIAGNOSIS — Z4589 Encounter for adjustment and management of other implanted devices: Secondary | ICD-10-CM | POA: Diagnosis not present

## 2015-05-09 DIAGNOSIS — Z01812 Encounter for preprocedural laboratory examination: Secondary | ICD-10-CM | POA: Diagnosis present

## 2015-05-09 LAB — BASIC METABOLIC PANEL
ANION GAP: 7 (ref 5–15)
BUN: 7 mg/dL (ref 6–20)
CHLORIDE: 106 mmol/L (ref 101–111)
CO2: 25 mmol/L (ref 22–32)
Calcium: 8.7 mg/dL — ABNORMAL LOW (ref 8.9–10.3)
Creatinine, Ser: 0.86 mg/dL (ref 0.44–1.00)
GFR calc Af Amer: >60 mL/min (ref >60)
GLUCOSE: 98 mg/dL (ref 65–99)
POTASSIUM: 4.1 mmol/L (ref 3.5–5.1)
SODIUM: 138 mmol/L (ref 135–145)

## 2015-05-09 NOTE — Pre-Procedure Instructions (Signed)
Pt. came in for lab work, stated that she did have EKG done at Dr. Merrilee Jansky office within the last month.  Office called by Rolly Pancake, RN; was told that pt. did have EKG and they will fax to [place] 05/10/2015.

## 2015-05-12 ENCOUNTER — Ambulatory Visit: Payer: Self-pay | Admitting: Otolaryngology

## 2015-05-12 ENCOUNTER — Encounter (HOSPITAL_BASED_OUTPATIENT_CLINIC_OR_DEPARTMENT_OTHER): Payer: Self-pay | Admitting: Certified Registered"

## 2015-05-12 ENCOUNTER — Ambulatory Visit (HOSPITAL_BASED_OUTPATIENT_CLINIC_OR_DEPARTMENT_OTHER)
Admission: RE | Admit: 2015-05-12 | Discharge: 2015-05-12 | Disposition: A | Discharge status: Home / Self Care | Payer: Medicaid Other | Source: Ambulatory Visit | Attending: Surgery | Admitting: Surgery

## 2015-05-12 ENCOUNTER — Ambulatory Visit (HOSPITAL_BASED_OUTPATIENT_CLINIC_OR_DEPARTMENT_OTHER): Payer: Medicaid Other | Admitting: Certified Registered"

## 2015-05-12 ENCOUNTER — Encounter (HOSPITAL_BASED_OUTPATIENT_CLINIC_OR_DEPARTMENT_OTHER)
Admission: RE | Disposition: A | Discharge status: Home / Self Care | Payer: Self-pay | Source: Ambulatory Visit | Attending: Surgery

## 2015-05-12 ENCOUNTER — Ambulatory Visit (HOSPITAL_COMMUNITY)
Admission: RE | Admit: 2015-05-12 | Discharge: 2015-05-12 | Disposition: A | Discharge status: Home / Self Care | Payer: Medicaid Other | Source: Ambulatory Visit | Attending: Surgery | Admitting: Surgery

## 2015-05-12 DIAGNOSIS — F17210 Nicotine dependence, cigarettes, uncomplicated: Secondary | ICD-10-CM | POA: Insufficient documentation

## 2015-05-12 DIAGNOSIS — I10 Essential (primary) hypertension: Secondary | ICD-10-CM | POA: Diagnosis not present

## 2015-05-12 DIAGNOSIS — Z30432 Encounter for removal of intrauterine contraceptive device: Secondary | ICD-10-CM | POA: Insufficient documentation

## 2015-05-12 HISTORY — PX: FOREIGN BODY REMOVAL: SHX962

## 2015-05-12 HISTORY — DX: Personal history of diseases of the blood and blood-forming organs and certain disorders involving the immune mechanism: Z86.2

## 2015-05-12 SURGERY — FOREIGN BODY REMOVAL ADULT
Anesthesia: General | Site: Arm Upper | Laterality: Left

## 2015-05-12 MED ORDER — SODIUM CHLORIDE 0.9 % IJ SOLN: 3.0000 mL | Freq: Two times a day (BID) | INTRAMUSCULAR | Status: DC

## 2015-05-12 MED ORDER — PROPOFOL 10 MG/ML IV BOLUS
INTRAVENOUS | Status: DC | PRN
  Administered 2015-05-12: 200 mg via INTRAVENOUS

## 2015-05-12 MED ORDER — FENTANYL CITRATE (PF) 100 MCG/2ML IJ SOLN
INTRAMUSCULAR | Status: AC
  Filled 2015-05-12: qty 2

## 2015-05-12 MED ORDER — LABETALOL HCL 5 MG/ML IV SOLN
5.0000 mg | INTRAVENOUS | Status: DC | PRN
  Administered 2015-05-12 (×2): 5 mg via INTRAVENOUS

## 2015-05-12 MED ORDER — ACETAMINOPHEN 650 MG RE SUPP: 650.0000 mg | RECTAL | Status: DC | PRN

## 2015-05-12 MED ORDER — BUPIVACAINE HCL (PF) 0.5 % IJ SOLN
INTRAMUSCULAR | Status: AC
  Filled 2015-05-12: qty 30

## 2015-05-12 MED ORDER — OXYCODONE HCL 5 MG PO TABS
5.0000 mg | ORAL_TABLET | Freq: Once | ORAL | Status: DC | PRN
Start: 2015-05-12 — End: 2015-05-12

## 2015-05-12 MED ORDER — CEFAZOLIN SODIUM-DEXTROSE 2-3 GM-% IV SOLR
INTRAVENOUS | Status: DC | PRN
  Administered 2015-05-12: 2 g via INTRAVENOUS

## 2015-05-12 MED ORDER — ONDANSETRON HCL 4 MG/2ML IJ SOLN: 4.0000 mg | Freq: Four times a day (QID) | INTRAMUSCULAR | Status: DC | PRN

## 2015-05-12 MED ORDER — LIDOCAINE HCL (CARDIAC) 20 MG/ML IV SOLN
INTRAVENOUS | Status: DC | PRN
  Administered 2015-05-12: 30 mg via INTRAVENOUS

## 2015-05-12 MED ORDER — LIDOCAINE HCL (PF) 1 % IJ SOLN
INTRAMUSCULAR | Status: AC
  Filled 2015-05-12: qty 30

## 2015-05-12 MED ORDER — SODIUM BICARBONATE 4 % IV SOLN
INTRAVENOUS | Status: AC
  Filled 2015-05-12: qty 5

## 2015-05-12 MED ORDER — SODIUM CHLORIDE 0.9 % IJ SOLN: 3.0000 mL | INTRAMUSCULAR | Status: DC | PRN

## 2015-05-12 MED ORDER — FENTANYL CITRATE (PF) 100 MCG/2ML IJ SOLN
INTRAMUSCULAR | Status: DC | PRN
  Administered 2015-05-12: 25 ug via INTRAVENOUS
  Administered 2015-05-12: 50 ug via INTRAVENOUS
  Administered 2015-05-12 (×3): 25 ug via INTRAVENOUS
  Administered 2015-05-12: 50 ug via INTRAVENOUS

## 2015-05-12 MED ORDER — FENTANYL CITRATE (PF) 100 MCG/2ML IJ SOLN
25.0000 ug | INTRAMUSCULAR | Status: DC | PRN
  Administered 2015-05-12: 50 ug via INTRAVENOUS
  Administered 2015-05-12: 25 ug via INTRAVENOUS

## 2015-05-12 MED ORDER — HEPARIN SODIUM (PORCINE) 5000 UNIT/ML IJ SOLN: 5000.0000 [IU] | Freq: Once | INTRAMUSCULAR | Status: DC

## 2015-05-12 MED ORDER — FENTANYL CITRATE (PF) 100 MCG/2ML IJ SOLN
INTRAMUSCULAR | Status: AC
  Filled 2015-05-12: qty 4

## 2015-05-12 MED ORDER — MIDAZOLAM HCL 2 MG/2ML IJ SOLN
INTRAMUSCULAR | Status: AC
  Filled 2015-05-12: qty 2

## 2015-05-12 MED ORDER — HYDROCODONE-ACETAMINOPHEN 5-325 MG PO TABS: 1.0000 | ORAL_TABLET | ORAL | Status: DC | PRN

## 2015-05-12 MED ORDER — BUPIVACAINE HCL (PF) 0.5 % IJ SOLN
INTRAMUSCULAR | Status: DC | PRN
  Administered 2015-05-12: 9 mL

## 2015-05-12 MED ORDER — SODIUM CHLORIDE 0.9 % IV SOLN: 250.0000 mL | INTRAVENOUS | Status: DC | PRN

## 2015-05-12 MED ORDER — PROPOFOL 500 MG/50ML IV EMUL
INTRAVENOUS | Status: AC
  Filled 2015-05-12: qty 50

## 2015-05-12 MED ORDER — OXYCODONE HCL 5 MG/5ML PO SOLN: 5.0000 mg | Freq: Once | ORAL | Status: DC | PRN

## 2015-05-12 MED ORDER — OXYCODONE HCL 5 MG PO TABS: 5.0000 mg | ORAL_TABLET | ORAL | Status: DC | PRN

## 2015-05-12 MED ORDER — LACTATED RINGERS IV SOLN
INTRAVENOUS | Status: DC
  Administered 2015-05-12 (×2): via INTRAVENOUS

## 2015-05-12 MED ORDER — ACETAMINOPHEN 325 MG PO TABS: 650.0000 mg | ORAL_TABLET | ORAL | Status: DC | PRN

## 2015-05-12 MED ORDER — ONDANSETRON HCL 4 MG/2ML IJ SOLN
INTRAMUSCULAR | Status: DC | PRN
  Administered 2015-05-12: 4 mg via INTRAVENOUS

## 2015-05-12 MED ORDER — CEFAZOLIN SODIUM-DEXTROSE 2-3 GM-% IV SOLR
INTRAVENOUS | Status: AC
  Filled 2015-05-12: qty 50

## 2015-05-12 MED ORDER — LABETALOL HCL 5 MG/ML IV SOLN
INTRAVENOUS | Status: AC
  Filled 2015-05-12: qty 4

## 2015-05-12 MED ORDER — DEXAMETHASONE SODIUM PHOSPHATE 4 MG/ML IJ SOLN
INTRAMUSCULAR | Status: DC | PRN
  Administered 2015-05-12: 10 mg via INTRAVENOUS

## 2015-05-12 MED ORDER — LIDOCAINE-EPINEPHRINE (PF) 1 %-1:200000 IJ SOLN
INTRAMUSCULAR | Status: AC
  Filled 2015-05-12: qty 10

## 2015-05-12 MED ORDER — MIDAZOLAM HCL 5 MG/5ML IJ SOLN
INTRAMUSCULAR | Status: DC | PRN
  Administered 2015-05-12: 2 mg via INTRAVENOUS

## 2015-05-12 SURGICAL SUPPLY — 58 items
APL SKNCLS STERI-STRIP NONHPOA (GAUZE/BANDAGES/DRESSINGS)
BENZOIN TINCTURE PRP APPL 2/3 (GAUZE/BANDAGES/DRESSINGS) IMPLANT
BLADE CLIPPER SURG (BLADE) IMPLANT
BLADE SURG 15 STRL LF DISP TIS (BLADE) ×1 IMPLANT
BLADE SURG 15 STRL SS (BLADE) ×3
BNDG COHESIVE 4X5 TAN STRL (GAUZE/BANDAGES/DRESSINGS) ×2 IMPLANT
CANISTER SUCT 1200ML W/VALVE (MISCELLANEOUS) ×2 IMPLANT
CLEANER CAUTERY TIP 5X5 PAD (MISCELLANEOUS) ×1 IMPLANT
CLOSURE WOUND 1/2 X4 (GAUZE/BANDAGES/DRESSINGS)
COVER BACK TABLE 60X90IN (DRAPES) ×3 IMPLANT
COVER MAYO STAND STRL (DRAPES) ×1 IMPLANT
COVER PROBE 5X48 (MISCELLANEOUS) ×3
DECANTER SPIKE VIAL GLASS SM (MISCELLANEOUS) ×3 IMPLANT
DRAPE EXTREMITY T 121X128X90 (DRAPE) ×2 IMPLANT
DRAPE LAPAROTOMY 100X72 PEDS (DRAPES) IMPLANT
DRAPE U 20/CS (DRAPES) ×2 IMPLANT
DRAPE U-SHAPE 76X120 STRL (DRAPES) IMPLANT
ELECT REM PT RETURN 9FT ADLT (ELECTROSURGICAL) ×3
ELECTRODE REM PT RTRN 9FT ADLT (ELECTROSURGICAL) ×1 IMPLANT
GLOVE BIO SURGEON STRL SZ8 (GLOVE) ×3 IMPLANT
GLOVE BIOGEL PI IND STRL 7.0 (GLOVE) IMPLANT
GLOVE BIOGEL PI INDICATOR 7.0 (GLOVE) ×4
GLOVE ECLIPSE 6.5 STRL STRAW (GLOVE) ×4 IMPLANT
GOWN STRL REUS W/ TWL LRG LVL3 (GOWN DISPOSABLE) ×1 IMPLANT
GOWN STRL REUS W/ TWL XL LVL3 (GOWN DISPOSABLE) ×1 IMPLANT
GOWN STRL REUS W/TWL LRG LVL3 (GOWN DISPOSABLE) ×6
GOWN STRL REUS W/TWL XL LVL3 (GOWN DISPOSABLE) ×3
KIT CVR 48X5XPRB PLUP LF (MISCELLANEOUS) IMPLANT
LIQUID BAND (GAUZE/BANDAGES/DRESSINGS) ×2 IMPLANT
NDL HYPO 25X1 1.5 SAFETY (NEEDLE) ×1 IMPLANT
NDL PRECISIONGLIDE 27X1.5 (NEEDLE) IMPLANT
NEEDLE HYPO 25X1 1.5 SAFETY (NEEDLE) ×3 IMPLANT
NEEDLE PRECISIONGLIDE 27X1.5 (NEEDLE) IMPLANT
NS IRRIG 1000ML POUR BTL (IV SOLUTION) ×2 IMPLANT
PACK BASIN DAY SURGERY FS (CUSTOM PROCEDURE TRAY) ×3 IMPLANT
PAD CLEANER CAUTERY TIP 5X5 (MISCELLANEOUS) ×2
PENCIL BUTTON HOLSTER BLD 10FT (ELECTRODE) ×3 IMPLANT
SHEET MEDIUM DRAPE 40X70 STRL (DRAPES) IMPLANT
SLEEVE SCD COMPRESS KNEE MED (MISCELLANEOUS) ×3 IMPLANT
SPONGE GAUZE 4X4 12PLY STER LF (GAUZE/BANDAGES/DRESSINGS) ×3 IMPLANT
STOCKINETTE 4X48 STRL (DRAPES) ×2 IMPLANT
STOCKINETTE IMPERVIOUS LG (DRAPES) ×2 IMPLANT
STRIP CLOSURE SKIN 1/2X4 (GAUZE/BANDAGES/DRESSINGS) IMPLANT
SUT ETHILON 3 0 FSL (SUTURE) IMPLANT
SUT ETHILON 5 0 PS 2 18 (SUTURE) IMPLANT
SUT VIC AB 3-0 SH 27 (SUTURE)
SUT VIC AB 3-0 SH 27X BRD (SUTURE) IMPLANT
SUT VIC AB 4-0 SH 18 (SUTURE) ×3 IMPLANT
SUT VIC AB 5-0 PS2 18 (SUTURE) IMPLANT
SUT VICRYL 3-0 CR8 SH (SUTURE) IMPLANT
SYR BULB 3OZ (MISCELLANEOUS) ×3 IMPLANT
SYR CONTROL 10ML LL (SYRINGE) ×3 IMPLANT
TOWEL OR 17X24 6PK STRL BLUE (TOWEL DISPOSABLE) ×3 IMPLANT
TRAY DSU PREP LF (CUSTOM PROCEDURE TRAY) ×5 IMPLANT
TUBE CONNECTING 20'X1/4 (TUBING) ×1
TUBE CONNECTING 20X1/4 (TUBING) ×1 IMPLANT
UNDERPAD 30X30 (UNDERPADS AND DIAPERS) ×2 IMPLANT
YANKAUER SUCT BULB TIP NO VENT (SUCTIONS) ×2 IMPLANT

## 2015-05-12 NOTE — Brief Op Note (Signed)
05/12/2015  1:32 PM  PATIENT:  Robin Guerrero  29 y.o. female  PRE-OPERATIVE DIAGNOSIS:  RETAINED BIRTH CONTROL DEVICE  POST-OPERATIVE DIAGNOSIS:  RETAINED BIRTH CONTROL DEVICE  PROCEDURE:  Procedure(s): REMOVAL OF LEFT ARM BIRTH CONTROL DEVICE (Left)  SURGEON:  Surgeon(s) and Role:    * Johnathan Hausen, MD - Primary  PHYSICIAN ASSISTANT:   ASSISTANTS: none   ANESTHESIA:   general  EBL:  Total I/O In: 1000 [I.V.:1000] Out: -   BLOOD ADMINISTERED:none  DRAINS: none   LOCAL MEDICATIONS USED:  LIDOCAINE   SPECIMEN:  Source of Specimen:  Implanton  DISPOSITION OF SPECIMEN:  give to patient  COUNTS:  YES  TOURNIQUET:  * No tourniquets in log *  DICTATION: .Other Dictation: Dictation Number [id number]  PLAN OF CARE: Discharge to home after PACU  PATIENT DISPOSITION:  PACU - hemodynamically stable.   Delay start of Pharmacological VTE agent (>24hrs) due to surgical blood loss or risk of bleeding: not applicable

## 2015-05-12 NOTE — H&P (Signed)
Chief Complaint:  Contraceptive device in left arm-for MR localization  History of Present Illness:  Robin Guerrero is an 29 y.o. female on whom I had previously tried to remove a contraceptive inplant.  This has been localized with MR assistance.    Past Medical History  Diagnosis Date  . Hypertension     states under control with med., has been on med. x 3 yr.  . History of anemia     no current med.    Past Surgical History  Procedure Laterality Date  . Submandibular gland excision Right 05/01/2009  . Foreign body removal Left 02/10/2015    Procedure: ATTEMPTED EXPLANTATION OF BIRTH CONTROL DEVICE LEFT ARM;  Surgeon: Johnathan Hausen, MD;  Location: [facility];  Service: General;  Laterality: Left;    Current Facility-Administered Medications  Medication Dose Route Frequency Provider Last Rate Last Dose  . labetalol (NORMODYNE,TRANDATE) injection 5 mg  5 mg Intravenous Q10 min PRN Albertha Ghee, MD      . lactated ringers infusion   Intravenous Continuous Roderic Palau, MD 10 mL/hr at 05/12/15 1159     Review of patient's allergies indicates no known allergies. History reviewed. No pertinent family history. Social History:   reports that she has been smoking Cigarettes.  She has smoked for the past 5 years. She has never used smokeless tobacco. She reports that she drinks alcohol. She reports that she does not use illicit drugs.   REVIEW OF SYSTEMS : Negative except for hypertension  Physical Exam:   Blood pressure 179/115, pulse 88, temperature 98.4 F (36.9 C), temperature source Oral, resp. rate 20, height 5\' 9"  (1.753 m), weight 104.412 kg (230 lb 3 oz), last menstrual period 04/28/2015, SpO2 100 %. Body mass index is 33.98 kg/(m^2).  Gen:  WDWN AAF NAD  Neurological: Alert and oriented to person, place, and time. Motor and sensory function is grossly intact  Head: Normocephalic and atraumatic.  Eyes: Conjunctivae are normal. Pupils are equal, round,  and reactive to light. No scleral icterus.  Neck: Normal range of motion. Neck supple. No tracheal deviation or thyromegaly present.  Cardiovascular:  SR without murmurs or gallops.  No carotid bruits Breast:  Not examined Respiratory: Effort normal.  No respiratory distress. No chest wall tenderness. Breath sounds normal.  No wheezes, rales or rhonchi.  Abdomen:  nontender GU:  Not examined Musculoskeletal: Normal range of motion. Extremities are nontender. No cyanosis, edema or clubbing noted Lymphadenopathy: No cervical, preauricular, postauricular or axillary adenopathy is present Skin: Skin is warm and dry. No rash noted. No diaphoresis. No erythema. No pallor. Pscyh: Normal mood and affect. Behavior is normal. Judgment and thought content normal.   LABORATORY RESULTS: No results found for this or any previous visit (from the past 48 hour(s)).   RADIOLOGY RESULTS: Mr Humerus Left Wo Contrast  05/12/2015   ADDENDUM REPORT: 05/12/2015 10:54  CLINICAL DATA:  For localization of Implanon implant prior to surgical removal today. Unsuccessful attempts at removal previously. No symptoms or complaints.   Electronically Signed   By: Kathreen Devoid   On: 05/12/2015 10:54   05/12/2015   EXAM: MRI OF THE LEFT HUMERUS WITHOUT CONTRAST  TECHNIQUE: Multiplanar, multisequence MR imaging was performed. No intravenous contrast was administered.  COMPARISON:  None.  FINDINGS: Normal marrow signal. No fracture or dislocation. Normal muscle signal. No fluid collection or hematoma.  The Implanon device is visualized in the medial mid upper arm approximately 12.5 cm proximal to the elbow crease. The  inferior aspect of the implant is marked with a superficial skin marker. The device is immediately deep to the skin marker. There is a small vessel immediately adjacent to the implant at the site of the marker.  IMPRESSION: The Implanon device is visualized in the medial mid upper arm approximately 12.5 cm proximal to the  elbow crease. The inferior aspect of the implant is marked with a superficial skin marker. The device is immediately deep to the skin marker. There is a small vessel immediately adjacent to the implant at the site of the marker with the vessel located between the skin marker and the implants.  Electronically Signed: By: Kathreen Devoid On: 05/12/2015 10:48    Problem List: There are no active problems to display for this patient.   Assessment & Plan: For removal of Implanon from left arm    Matt B. Hassell Done, MD, [facility] [phone] beeper [phone]  05/12/2015 12:00 PM

## 2015-05-12 NOTE — Progress Notes (Signed)
Dr Mertha Baars seen pt and wants pt moved to monitor bed and will start med to decrease bp

## 2015-05-12 NOTE — Discharge Instructions (Addendum)
May shower in the morning  Post Anesthesia Home Care Instructions  Activity: Get plenty of rest for the remainder of the day. A responsible adult should stay with you for 24 hours following the procedure.  For the next 24 hours, DO NOT: -Drive a car -Paediatric nurse -Drink alcoholic beverages -Take any medication unless instructed by your physician -Make any legal decisions or sign important papers.  Meals: Start with liquid foods such as gelatin or soup. Progress to regular foods as tolerated. Avoid greasy, spicy, heavy foods. If nausea and/or vomiting occur, drink only clear liquids until the nausea and/or vomiting subsides. Call your physician if vomiting continues.  Special Instructions/Symptoms: Your throat may feel dry or sore from the anesthesia or the breathing tube placed in your throat during surgery. If this causes discomfort, gargle with warm salt water. The discomfort should disappear within 24 hours.  If you had a scopolamine patch placed behind your ear for the management of post- operative nausea and/or vomiting:  1. The medication in the patch is effective for 72 hours, after which it should be removed.  Wrap patch in a tissue and discard in the trash. Wash hands thoroughly with soap and water. 2. You may remove the patch earlier than 72 hours if you experience unpleasant side effects which may include dry mouth, dizziness or visual disturbances. 3. Avoid touching the patch. Wash your hands with soap and water after contact with the patch.

## 2015-05-12 NOTE — Transfer of Care (Signed)
Immediate Anesthesia Transfer of Care Note  Patient: Robin Guerrero  Procedure(s) Performed: Procedure(s): REMOVAL OF LEFT ARM BIRTH CONTROL DEVICE (Left)  Patient Location: PACU  Anesthesia Type:General  Level of Consciousness: awake, alert , oriented and patient cooperative  Airway & Oxygen Therapy: Patient Spontanous Breathing and Patient connected to face mask oxygen  Post-op Assessment: Report given to RN and Post -op Vital signs reviewed and stable  Post vital signs: Reviewed and stable  Last Vitals:  Filed Vitals:   05/12/15 1212  BP: 175/115  Pulse:   Temp:   Resp:     Complications: No apparent anesthesia complications

## 2015-05-12 NOTE — Anesthesia Preprocedure Evaluation (Addendum)
Anesthesia Evaluation  Patient identified by MRN, date of birth, ID band Patient awake    Reviewed: Allergy & Precautions, NPO status , Patient's Chart, lab work & pertinent test results  Airway Mallampati: II   Neck ROM: full    Dental   Pulmonary Current Smoker,  breath sounds clear to auscultation        Cardiovascular hypertension, Rhythm:regular Rate:Normal     Neuro/Psych    GI/Hepatic   Endo/Other  obese  Renal/GU      Musculoskeletal   Abdominal   Peds  Hematology   Anesthesia Other Findings   Reproductive/Obstetrics                            Anesthesia Physical Anesthesia Plan  ASA: II  Anesthesia Plan: General   Post-op Pain Management:    Induction: Intravenous  Airway Management Planned: LMA  Additional Equipment:   Intra-op Plan:   Post-operative Plan:   Informed Consent: I have reviewed the patients History and Physical, chart, labs and discussed the procedure including the risks, benefits and alternatives for the proposed anesthesia with the patient or authorized representative who has indicated his/her understanding and acceptance.     Plan Discussed with: CRNA, Anesthesiologist and Surgeon  Anesthesia Plan Comments: (Pt has been non-compliant with her BP medication and is hypertensive today.  She understands that this creates an increased risk of morbidity perioperatively and wishes to proceed with removal of her birth control implant.)       Anesthesia Quick Evaluation

## 2015-05-12 NOTE — Anesthesia Procedure Notes (Signed)
Procedure Name: LMA Insertion Date/Time: 05/12/2015 12:23 PM Performed by: Shaylah Mcghie D Pre-anesthesia Checklist: Patient identified, Emergency Drugs available, Suction available and Patient being monitored Patient Re-evaluated:Patient Re-evaluated prior to inductionOxygen Delivery Method: Circle System Utilized Preoxygenation: Pre-oxygenation with 100% oxygen Intubation Type: IV induction Ventilation: Mask ventilation without difficulty LMA: LMA inserted LMA Size: 4.0 Number of attempts: 1 Airway Equipment and Method: Bite block Placement Confirmation: positive ETCO2 Tube secured with: Tape Dental Injury: Teeth and Oropharynx as per pre-operative assessment

## 2015-05-12 NOTE — Anesthesia Postprocedure Evaluation (Signed)
Anesthesia Post Note  Patient: Robin Guerrero  Procedure(s) Performed: Procedure(s) (LRB): REMOVAL OF LEFT ARM BIRTH CONTROL DEVICE (Left)  Anesthesia type: General  Patient location: PACU  Post pain: Pain level controlled and Adequate analgesia  Post assessment: Post-op Vital signs reviewed, Patient's Cardiovascular Status Stable, Respiratory Function Stable, Patent Airway and Pain level controlled  Last Vitals:  Filed Vitals:   05/12/15 1400  BP: 159/101  Pulse: 86  Temp:   Resp: 15    Post vital signs: Reviewed and stable  Level of consciousness: awake, alert  and oriented  Complications: No apparent anesthesia complications

## 2015-05-13 NOTE — Op Note (Deleted)
NAMEMarland Kitchen  SANAE, WILLETTS NO.:  [id number]  MEDICAL RECORD NO.:  [id number]  LOCATION:  MRI                          FACILITY:  [facility]  PHYSICIAN:  Isabel Caprice. Hassell Done, MD  DATE OF BIRTH:  1986-01-08  DATE OF PROCEDURE:  05/12/2015 DATE OF DISCHARGE:  05/12/2015                              OPERATIVE REPORT   PREOPERATIVE DIAGNOSIS:  Implanon device in left forearm for birth control.  PROCEDURE:  Explantation of Implanon device following MR localization.  SURGEON:  Isabel Caprice. Hassell Done, MD  ANESTHESIA:  General by LMA.  DESCRIPTION OF PROCEDURE:  The patient is a 29 year old African American female on whom I had previously tried to find this device and was unsuccessful in both using ultrasound and in the operating room.  I sent her for MR localization.  I took her to room 6 and after general anesthesia, we prepped her arm with PCMX and draped her sterilely.  Time- out was performed.  I went midway in the bracketed area that had previously been placed by the radiologist, made a transverse incision and carried this down through the fatty tissue down all the way to the fascia, where I was trying to feel for this device.  I was unsuccessful there.  I then went superior to this, feeling the subcutaneous tissue and I was eventually able to feel the device above this incision. Interestingly, this device was implanted pretty far away from any previous incision for insertion.  I was fortunately able to feel the tip, grasp it and pull it out in toto.  It was a yellowish, rubbery, a little tube approximately 3 cm long.  I then irrigated, injected with some lidocaine and closed with 4-0 Vicryl with LiquiBand.  The patient tolerated the procedure well, was taken to recovery room in satisfactory condition.     Isabel Caprice Hassell Done, MD     MBM/MEDQ  D:  05/12/2015  T:  05/13/2015  Job:  [id number]

## 2015-05-13 NOTE — Op Note (Signed)
NAMEMarland Guerrero  MAHAYLA, [name]:  [id number]  MEDICAL RECORD NO.:  [id number]  LOCATION:  MRI                          FACILITY:  [facility]  PHYSICIAN:  Isabel Caprice. Hassell Done, MD  DATE OF BIRTH:  23-Jan-1986  DATE OF PROCEDURE:  05/12/2015 DATE OF DISCHARGE:  05/12/2015                              OPERATIVE REPORT   PREOPERATIVE DIAGNOSIS:  Implanon device in left forearm for birth control.  PROCEDURE:  Explantation of Implanon device following MR localization.  SURGEON:  Isabel Caprice. Hassell Done, MD  ANESTHESIA:  General by LMA.  DESCRIPTION OF PROCEDURE:  The patient is a 29 year old African American female on whom I had previously tried to find this device and was unsuccessful in both using ultrasound and in the operating room.  I sent her for MR localization.  I took her to room 6 and after general anesthesia, we prepped her arm with PCMX and draped her sterilely.  Time- out was performed.  I went midway in the bracketed area that had previously been placed by the radiologist, made a transverse incision and carried this down through the fatty tissue down all the way to the fascia, where I was trying to feel for this device.  I was unsuccessful there.  I then went superior to this, feeling the subcutaneous tissue and I was eventually able to feel the device above this incision. Interestingly, this device was implanted pretty far away from any previous incision for insertion.  I was fortunately able to feel the tip, grasp it and pull it out in toto.  It was a yellowish, rubbery, a little tube approximately 3 cm long.  I then irrigated, injected with some lidocaine and closed with 4-0 Vicryl with LiquiBand.  The patient tolerated the procedure well, was taken to recovery room in satisfactory condition.     Isabel Caprice Hassell Done, MD     MBM/MEDQ  D:  05/12/2015  T:  05/13/2015  Job:  [id number]

## 2015-05-16 ENCOUNTER — Encounter (HOSPITAL_BASED_OUTPATIENT_CLINIC_OR_DEPARTMENT_OTHER): Payer: Self-pay | Admitting: Surgery

## 2016-05-27 ENCOUNTER — Encounter (HOSPITAL_COMMUNITY): Payer: Self-pay | Admitting: Emergency Medicine

## 2016-05-27 ENCOUNTER — Emergency Department (HOSPITAL_COMMUNITY)
Admission: EM | Admit: 2016-05-27 | Discharge: 2016-05-27 | Disposition: A | Discharge status: Home / Self Care | Payer: No Typology Code available for payment source | Attending: Emergency Medicine | Admitting: Emergency Medicine

## 2016-05-27 DIAGNOSIS — F17210 Nicotine dependence, cigarettes, uncomplicated: Secondary | ICD-10-CM | POA: Diagnosis not present

## 2016-05-27 DIAGNOSIS — Y92410 Unspecified street and highway as the place of occurrence of the external cause: Secondary | ICD-10-CM | POA: Insufficient documentation

## 2016-05-27 DIAGNOSIS — Y939 Activity, unspecified: Secondary | ICD-10-CM | POA: Diagnosis not present

## 2016-05-27 DIAGNOSIS — Z79899 Other long term (current) drug therapy: Secondary | ICD-10-CM | POA: Diagnosis not present

## 2016-05-27 DIAGNOSIS — Y999 Unspecified external cause status: Secondary | ICD-10-CM | POA: Insufficient documentation

## 2016-05-27 DIAGNOSIS — M549 Dorsalgia, unspecified: Secondary | ICD-10-CM | POA: Insufficient documentation

## 2016-05-27 DIAGNOSIS — I10 Essential (primary) hypertension: Secondary | ICD-10-CM | POA: Insufficient documentation
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
## 2016-05-27 NOTE — Discharge Instructions (Signed)

## 2016-05-27 NOTE — ED Notes (Signed)
Pt stated "I haven't been taking my b/p meds.  I haven't had the money to get it filled.  I just went to [employer] to feed my kids & my card was rejected. I've been trying to catch up on my rent and my bills.  I just don't have the money."  PA-C informed of pt's elevated b/p & conversation with pt.  No new orders received.
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
## 2019-02-11 DIAGNOSIS — E669 Obesity, unspecified: Secondary | ICD-10-CM | POA: Diagnosis not present

## 2019-02-11 DIAGNOSIS — I10 Essential (primary) hypertension: Secondary | ICD-10-CM | POA: Diagnosis not present

## 2019-02-11 DIAGNOSIS — Z136 Encounter for screening for cardiovascular disorders: Secondary | ICD-10-CM | POA: Diagnosis not present

## 2020-04-27 ENCOUNTER — Other Ambulatory Visit: Payer: Self-pay | Admitting: Physician Assistant

## 2020-04-27 DIAGNOSIS — N644 Mastodynia: Secondary | ICD-10-CM

## 2020-05-11 ENCOUNTER — Other Ambulatory Visit: Payer: Self-pay

## 2020-05-24 ENCOUNTER — Other Ambulatory Visit: Payer: Self-pay

## 2020-05-24 ENCOUNTER — Ambulatory Visit
Admission: RE | Admit: 2020-05-24 | Discharge: 2020-05-24 | Disposition: A | Discharge status: Home / Self Care | Payer: Medicaid Other | Source: Ambulatory Visit | Attending: Physician Assistant | Admitting: Physician Assistant

## 2020-05-24 ENCOUNTER — Other Ambulatory Visit: Payer: Self-pay | Admitting: Physician Assistant

## 2020-05-24 ENCOUNTER — Ambulatory Visit: Payer: Self-pay

## 2020-05-24 DIAGNOSIS — N644 Mastodynia: Secondary | ICD-10-CM

## 2020-05-29 ENCOUNTER — Ambulatory Visit
Admission: RE | Admit: 2020-05-29 | Discharge: 2020-05-29 | Disposition: A | Discharge status: Home / Self Care | Payer: Medicaid Other | Source: Ambulatory Visit | Attending: Physician Assistant | Admitting: Physician Assistant

## 2020-05-29 ENCOUNTER — Other Ambulatory Visit: Payer: Self-pay

## 2020-05-29 ENCOUNTER — Emergency Department (HOSPITAL_COMMUNITY): Payer: Medicaid Other

## 2020-05-29 ENCOUNTER — Emergency Department (HOSPITAL_COMMUNITY)
Admission: EM | Admit: 2020-05-29 | Discharge: 2020-05-30 | Disposition: A | Discharge status: Home / Self Care | Payer: Medicaid Other | Attending: Emergency Medicine | Admitting: Emergency Medicine

## 2020-05-29 ENCOUNTER — Encounter (HOSPITAL_COMMUNITY): Payer: Self-pay

## 2020-05-29 DIAGNOSIS — R000 Tachycardia, unspecified: Secondary | ICD-10-CM | POA: Insufficient documentation

## 2020-05-29 DIAGNOSIS — Z79899 Other long term (current) drug therapy: Secondary | ICD-10-CM | POA: Insufficient documentation

## 2020-05-29 DIAGNOSIS — R1030 Lower abdominal pain, unspecified: Secondary | ICD-10-CM | POA: Diagnosis not present

## 2020-05-29 DIAGNOSIS — F1099 Alcohol use, unspecified with unspecified alcohol-induced disorder: Secondary | ICD-10-CM | POA: Diagnosis not present

## 2020-05-29 DIAGNOSIS — I10 Essential (primary) hypertension: Secondary | ICD-10-CM | POA: Diagnosis not present

## 2020-05-29 DIAGNOSIS — F17210 Nicotine dependence, cigarettes, uncomplicated: Secondary | ICD-10-CM | POA: Diagnosis not present

## 2020-05-29 DIAGNOSIS — N939 Abnormal uterine and vaginal bleeding, unspecified: Secondary | ICD-10-CM

## 2020-05-29 DIAGNOSIS — E876 Hypokalemia: Secondary | ICD-10-CM | POA: Diagnosis not present

## 2020-05-29 DIAGNOSIS — N644 Mastodynia: Secondary | ICD-10-CM

## 2020-05-29 DIAGNOSIS — R9431 Abnormal electrocardiogram [ECG] [EKG]: Secondary | ICD-10-CM | POA: Diagnosis not present

## 2020-05-29 DIAGNOSIS — R5383 Other fatigue: Secondary | ICD-10-CM | POA: Insufficient documentation

## 2020-05-29 LAB — COMPREHENSIVE METABOLIC PANEL
ALT: 58 U/L — ABNORMAL HIGH (ref 0–44)
AST: 88 U/L — ABNORMAL HIGH (ref 15–41)
Albumin: 3.9 g/dL (ref 3.5–5.0)
Alkaline Phosphatase: 50 U/L (ref 38–126)
Anion gap: 19 — ABNORMAL HIGH (ref 5–15)
BUN: 12 mg/dL (ref 6–20)
CO2: 24 mmol/L (ref 22–32)
Calcium: 9.4 mg/dL (ref 8.9–10.3)
Chloride: 96 mmol/L — ABNORMAL LOW (ref 98–111)
Creatinine, Ser: 1.31 mg/dL — ABNORMAL HIGH (ref 0.44–1.00)
GFR calc Af Amer: >60 mL/min (ref >60)
GFR calc non Af Amer: 53 mL/min — ABNORMAL LOW (ref >60)
Glucose, Bld: 122 mg/dL — ABNORMAL HIGH (ref 70–99)
Potassium: 2.8 mmol/L — ABNORMAL LOW (ref 3.5–5.1)
Sodium: 139 mmol/L (ref 135–145)
Total Bilirubin: 0.9 mg/dL (ref 0.3–1.2)
Total Protein: 8.1 g/dL (ref 6.5–8.1)

## 2020-05-29 LAB — TYPE AND SCREEN
ABO/RH(D): A POS
Antibody Screen: NEGATIVE

## 2020-05-29 LAB — CBC
HCT: 28.7 % — ABNORMAL LOW (ref 36.0–46.0)
Hemoglobin: 8.6 g/dL — ABNORMAL LOW (ref 12.0–15.0)
MCH: 24 pg — ABNORMAL LOW (ref 26.0–34.0)
MCHC: 30 g/dL (ref 30.0–36.0)
MCV: 79.9 fL — ABNORMAL LOW (ref 80.0–100.0)
Platelets: 355 10*3/uL (ref 150–400)
RBC: 3.59 MIL/uL — ABNORMAL LOW (ref 3.87–5.11)
RDW: 21.2 % — ABNORMAL HIGH (ref 11.5–15.5)
WBC: 7.7 10*3/uL (ref 4.0–10.5)
nRBC: 0 % (ref 0.0–0.2)

## 2020-05-29 LAB — I-STAT BETA HCG BLOOD, ED (MC, WL, AP ONLY): I-stat hCG, quantitative: <5 m[IU]/mL (ref <5)

## 2020-05-29 MED ORDER — POTASSIUM CHLORIDE 10 MEQ/100ML IV SOLN
10.0000 meq | INTRAVENOUS | Status: AC
  Administered 2020-05-30 (×2): 10 meq via INTRAVENOUS
  Filled 2020-05-29 (×2): qty 100

## 2020-05-29 MED ORDER — MAGNESIUM SULFATE 2 GM/50ML IV SOLN
2.0000 g | Freq: Once | INTRAVENOUS | Status: AC
  Administered 2020-05-30: 2 g via INTRAVENOUS
  Filled 2020-05-29: qty 50

## 2020-05-29 MED ORDER — SODIUM CHLORIDE 0.9 % IV BOLUS
1000.0000 mL | Freq: Once | INTRAVENOUS | Status: AC
  Administered 2020-05-30: 1000 mL via INTRAVENOUS

## 2020-05-29 NOTE — ED Triage Notes (Signed)
Patient arrived stating she has had 3-4 weeks of  vaginal bleeding with clots and dizziness. Reports on Friday she was seen by her primary, who called her today and states her hemoglobin was 8 to report to the ED for a blood transfusion and told her to request for a CT scan of her pelvis to check for fibroids.

## 2020-05-29 NOTE — ED Provider Notes (Signed)
[facility] DEPT Provider Note   CSN: [id number] Arrival date & time: 05/29/20  1920    History Chief Complaint  Patient presents with  . Vaginal Bleeding    Robin Guerrero is a 34 y.o. female with past medical history significant for anemia, hypertension who presents for evaluation of greater than 1 month of vaginal bleeding.  Patient states she has been having clots the size of times.  Was seen Friday by her PCP at [facility].  Was called today and told her hemoglobin was 8.  They recommended her come to the emergency department for repeat blood work and possible imaging.  She has had some fatigue as well as some intermittent lightheadedness however none currently.  She denies headache, chest pain, shortness of breath, neck pain, neck stiffness.  She intermittently has some lower abdominal cramping.  No dysuria, concerns for STDs.  She is sexually active.  No diarrhea or dysuria.  Denies aggravating relieving factors.  She is not currently on any iron supplementation, OCPs.  No prior history of PE, DVT.  Denies additional aggravating relieving factors.  History obtained from patient and past medical records.  No interpreter is used.  HPI     Past Medical History:  Diagnosis Date  . History of anemia    no current med.  . Hypertension    states under control with med., has been on med. x 3 yr.    There are no problems to display for this patient.   Past Surgical History:  Procedure Laterality Date  . FOREIGN BODY REMOVAL Left 02/10/2015   Procedure: ATTEMPTED EXPLANTATION OF BIRTH CONTROL DEVICE LEFT ARM;  Surgeon: Johnathan Hausen, MD;  Location: [facility];  Service: General;  Laterality: Left;  . FOREIGN BODY REMOVAL Left 05/12/2015   Procedure: REMOVAL OF LEFT ARM BIRTH CONTROL DEVICE;  Surgeon: Johnathan Hausen, MD;  Location: [facility];  Service: General;  Laterality: Left;  . SUBMANDIBULAR GLAND EXCISION  Right 05/01/2009     OB History   No obstetric history on file.     Family History  Problem Relation Age of Onset  . Breast cancer Mother 17    Social History   Tobacco Use  . Smoking status: Current Every Day Smoker    Years: 5.00    Types: Cigarettes  . Smokeless tobacco: Never Used  . Tobacco comment: 1 pack/week  Substance Use Topics  . Alcohol use: Yes    Comment: occasionally  . Drug use: No    Home Medications Prior to Admission medications   Medication Sig Start Date End Date Taking? Authorizing Provider  amLODipine (NORVASC) 5 MG tablet Take 5 mg by mouth daily. 05/25/20  Yes [provider]  losartan-hydrochlorothiazide (HYZAAR) 100-25 MG tablet Take 1 tablet by mouth daily. 05/24/20  Yes [provider]  ferrous sulfate 325 (65 FE) MG tablet Take 1 tablet (325 mg total) by mouth daily. 05/30/20   Trini Christiansen A, PA-C  HYDROcodone-acetaminophen (NORCO) 5-325 MG per tablet Take 1 tablet by mouth every 4 (four) hours as needed for moderate pain. Patient not taking: Reported on 05/30/2020 05/12/15   Johnathan Hausen, MD  tranexamic acid (LYSTEDA) 650 MG TABS tablet Take 2 tablets (1,300 mg total) by mouth 3 (three) times daily for 5 days. 05/30/20 06/04/20  Unique Searfoss A, PA-C    Allergies    Patient has no known allergies.  Review of Systems   Review of Systems  Constitutional: Positive for  fatigue. Negative for activity change, appetite change, chills, diaphoresis, fever and unexpected weight change.  HENT: Negative.   Respiratory: Negative.   Cardiovascular: Negative.   Gastrointestinal: Positive for abdominal pain (Cramping- lower abdomen). Negative for abdominal distention, anal bleeding, blood in stool, constipation, diarrhea, nausea, rectal pain and vomiting.  Genitourinary: Positive for menstrual problem. Negative for decreased urine volume, difficulty urinating, dysuria, flank pain, frequency, genital sores, hematuria, pelvic pain,  urgency, vaginal bleeding, vaginal discharge and vaginal pain.  Musculoskeletal: Negative.   Skin: Negative.   Neurological: Negative.   All other systems reviewed and are negative.   Physical Exam Updated Vital Signs BP 136/86   Pulse (!) 103   Temp 98.6 F (37 C)   Resp 20   Ht 5' 9"  (1.753 m)   Wt 113.4 kg   LMP 05/24/2020   SpO2 100%   BMI 36.92 kg/m   Physical Exam Vitals and nursing note reviewed.  Constitutional:      General: She is not in acute distress.    Appearance: She is well-developed. She is not ill-appearing, toxic-appearing or diaphoretic.  HENT:     Head: Normocephalic and atraumatic.     Nose: Nose normal.     Mouth/Throat:     Mouth: Mucous membranes are moist.  Eyes:     Pupils: Pupils are equal, round, and reactive to light.  Cardiovascular:     Rate and Rhythm: Tachycardia present.     Pulses: Normal pulses.     Heart sounds: Normal heart sounds.  Pulmonary:     Effort: Pulmonary effort is normal. No respiratory distress.     Breath sounds: Normal breath sounds.  Abdominal:     General: Bowel sounds are normal. There is no distension.     Tenderness: There is no abdominal tenderness. There is no right CVA tenderness, left CVA tenderness, guarding or rebound.  Genitourinary:    Comments: Normal appearing external female genitalia without rashes or lesions, normal vaginal epithelium. Normal appearing cervix without discharge or petechiae. Cervical os is open. There is moderate bleeding noted at the os however no hemorrhage . No Odor. Bimanual: No CMT,nontender.  No palpable adnexal masses or tenderness. Uterus midline and not fixed. Rectovaginal exam was deffered.  No cystocele or rectocele noted. No pelvic lymphadenopathy noted. Wet prep was obtained.  Cultures for gonorrhea and chlamydia collected. Exam performed with chaperone in room. Musculoskeletal:        General: Normal range of motion.     Cervical back: Normal range of motion.      Comments: Moves all 4 extremities without difficulty. Homans negative. Compartments soft.  Skin:    General: Skin is warm and dry.     Capillary Refill: Capillary refill takes less than 2 seconds.     Comments: No edema, erythema or warmth.  Brisk capillary refill.  Neurological:     Mental Status: She is alert.     Comments: Ambulatory without difficulty.  Cranials 2 through 12 grossly intact    ED Results / Procedures / Treatments   Labs (all labs ordered are listed, but only abnormal results are displayed) Labs Reviewed  COMPREHENSIVE METABOLIC PANEL - Abnormal; Notable for the following components:      Result Value   Potassium 2.8 (*)    Chloride 96 (*)    Glucose, Bld 122 (*)    Creatinine, Ser 1.31 (*)    AST 88 (*)    ALT 58 (*)    GFR calc non  Af Amer 53 (*)    Anion gap 19 (*)    All other components within normal limits  CBC - Abnormal; Notable for the following components:   RBC 3.59 (*)    Hemoglobin 8.6 (*)    HCT 28.7 (*)    MCV 79.9 (*)    MCH 24.0 (*)    RDW 21.2 (*)    All other components within normal limits  I-STAT BETA HCG BLOOD, ED ([facility], [facility], [facility] ONLY)  TYPE AND SCREEN  ABO/RH    EKG EKG Interpretation  Date/Time:  Tuesday May 30 2020 01:51:08 EDT Ventricular Rate:  108 PR Interval:    QRS Duration: 89 QT Interval:  399 QTC Calculation: 535 R Axis:   15 Text Interpretation: Sinus tachycardia Probable left atrial enlargement Low voltage, precordial leads Consider anterior infarct Prolonged QT interval QT prolonged similar to previous Confirmed by Ezequiel Essex [phone]) on 05/30/2020 1:59:32 AM   Radiology US Transvaginal Non-OB  Result Date: 05/29/2020 CLINICAL DATA:  Initial evaluation for vaginal bleeding for 1 month. EXAM: TRANSABDOMINAL AND TRANSVAGINAL ULTRASOUND OF PELVIS TECHNIQUE: Both transabdominal and transvaginal ultrasound examinations of the pelvis were performed. Transabdominal technique was performed for global imaging of the  pelvis including uterus, ovaries, adnexal regions, and pelvic cul-de-sac. It was necessary to proceed with endovaginal exam following the transabdominal exam to visualize the uterus, endometrium, and ovaries. COMPARISON:  None available. FINDINGS: Uterus Measurements: 7.5 x 4.4 x 5.2 cm = volume: 89.8 mL. 1.4 x 1.3 x 1.8 cm intramural fibroid present at the left uterine fundus. 4.2 x 3.5 x 3.8 cm exophytic/subserosal fibroid present at the posterior lower uterine segment. Endometrium Thickness: 7.8 mm. No focal abnormality visualized. Small amount of mildly complex fluid seen within the endometrial cavity. Right ovary Not visualized.  No adnexal mass. Left ovary Not visualized.  No adnexal mass. Other findings No abnormal free fluid. IMPRESSION: 1. Endometrial stripe measures 7.8 mm in thickness, with a small amount of mildly complex fluid within the endometrial cavity, likely blood products given history of vaginal bleeding. If bleeding remains unresponsive to hormonal or medical therapy, sonohysterogram should be considered for focal lesion work-up. (Ref: Radiological Reasoning: Algorithmic Workup of Abnormal Vaginal Bleeding with Endovaginal Sonography and Sonohysterography. AJR 2008; 798:X21-19). 2. Underlying fibroid uterus as detailed above. 3. Nonvisualization of either ovary. No adnexal mass or free fluid within the pelvis. Electronically Signed   By: Jeannine Boga M.D.   On: 05/29/2020 23:53   US Pelvis Complete  Result Date: 05/29/2020 CLINICAL DATA:  Initial evaluation for vaginal bleeding for 1 month. EXAM: TRANSABDOMINAL AND TRANSVAGINAL ULTRASOUND OF PELVIS TECHNIQUE: Both transabdominal and transvaginal ultrasound examinations of the pelvis were performed. Transabdominal technique was performed for global imaging of the pelvis including uterus, ovaries, adnexal regions, and pelvic cul-de-sac. It was necessary to proceed with endovaginal exam following the transabdominal exam to visualize  the uterus, endometrium, and ovaries. COMPARISON:  None available. FINDINGS: Uterus Measurements: 7.5 x 4.4 x 5.2 cm = volume: 89.8 mL. 1.4 x 1.3 x 1.8 cm intramural fibroid present at the left uterine fundus. 4.2 x 3.5 x 3.8 cm exophytic/subserosal fibroid present at the posterior lower uterine segment. Endometrium Thickness: 7.8 mm. No focal abnormality visualized. Small amount of mildly complex fluid seen within the endometrial cavity. Right ovary Not visualized.  No adnexal mass. Left ovary Not visualized.  No adnexal mass. Other findings No abnormal free fluid. IMPRESSION: 1. Endometrial stripe measures 7.8 mm in thickness, with a small amount  of mildly complex fluid within the endometrial cavity, likely blood products given history of vaginal bleeding. If bleeding remains unresponsive to hormonal or medical therapy, sonohysterogram should be considered for focal lesion work-up. (Ref: Radiological Reasoning: Algorithmic Workup of Abnormal Vaginal Bleeding with Endovaginal Sonography and Sonohysterography. AJR 2008; 962:X52-84). 2. Underlying fibroid uterus as detailed above. 3. Nonvisualization of either ovary. No adnexal mass or free fluid within the pelvis. Electronically Signed   By: Jeannine Boga M.D.   On: 05/29/2020 23:53   MM CLIP PLACEMENT LEFT  Result Date: 05/29/2020 CLINICAL DATA:  34 year old female status post ultrasound-guided biopsy of the left breast. EXAM: DIAGNOSTIC LEFT MAMMOGRAM POST ULTRASOUND BIOPSY COMPARISON:  Previous exam(s). FINDINGS: Mammographic images were obtained following ultrasound guided biopsy of left breast. The biopsy marking clip is in expected position at the site of biopsy. Please note, the biopsy marking clip and associated mass are noted in the upper inner quadrant. This is an expected variation from the MLO to ML view given the far medial position of the finding. IMPRESSION: Appropriate positioning of the ribbon shaped biopsy marking clip at the site of  biopsy in the upper inner left breast. Final Assessment: Post Procedure Mammograms for Marker Placement Electronically Signed   By: Kristopher Oppenheim M.D.   On: 05/29/2020 10:33   [place] LT BREAST BX W LOC DEV 1ST LESION IMG BX SPEC US GUIDE  Result Date: 05/29/2020 CLINICAL DATA:  34 year old female with an indeterminate left breast mass. EXAM: ULTRASOUND GUIDED LEFT BREAST CORE NEEDLE BIOPSY COMPARISON:  Previous exam(s). PROCEDURE: I met with the patient and we discussed the procedure of ultrasound-guided biopsy, including benefits and alternatives. We discussed the high likelihood of a successful procedure. We discussed the risks of the procedure, including infection, bleeding, tissue injury, clip migration, and inadequate sampling. Informed written consent was given. The usual time-out protocol was performed immediately prior to the procedure. Lesion quadrant: Upper inner quadrant Using sterile technique and 1% Lidocaine as local anesthetic, under direct ultrasound visualization, a 12 gauge spring-loaded device was used to perform biopsy of the mass at the 10 o'clock position 8 cm from the nipple using a inferior approach. At the conclusion of the procedure ribbon shaped tissue marker clip was deployed into the biopsy cavity. Follow up 2 view mammogram was performed and dictated separately. IMPRESSION: Ultrasound guided biopsy of left breast.  No apparent complications. Electronically Signed   By: Kristopher Oppenheim M.D.   On: 05/29/2020 10:31    Procedures Procedures (including critical care time)  Medications Ordered in ED Medications  potassium chloride 10 mEq in 100 mL IVPB (10 mEq Intravenous New Bag/Given 05/30/20 0205)  sodium chloride 0.9 % bolus 1,000 mL (1,000 mLs Intravenous New Bag/Given (Non-Interop) 05/30/20 0025)  magnesium sulfate IVPB 2 g 50 mL (0 g Intravenous Stopped 05/30/20 0052)  ketorolac (TORADOL) 30 MG/ML injection 30 mg (30 mg Intravenous Given 05/30/20 0049)  sodium chloride 0.9 %  bolus 1,000 mL (1,000 mLs Intravenous New Bag/Given (Non-Interop) 05/30/20 0205)  metoprolol tartrate (LOPRESSOR) tablet 25 mg (25 mg Oral Given 05/30/20 0206)   ED Course  I have reviewed the triage vital signs and the nursing notes.  Pertinent labs & imaging results that were available during my care of the patient were reviewed by me and considered in my medical decision making (see chart for details).  34 year old female presents for evaluation of vaginal bleeding.  She is afebrile, nonseptic, not ill-appearing.  Changing pad every 3-4 hours.  Has been followed  by her PCP however not start any additional medication.  Hemoglobin was 8 at PCP office and she was sent here for further evaluation.  She has intermittent lightheadedness with some fatigue.  Her orthostatic vital signs are negative.  Occasionally have lower abdominal cramping.  Denies concerns for STDs.  She is mildly tachycardic in the room.   Labs and imaging personally reviewed and interpreted: CBC without leukocytosis, hemoglobin 8.6, no recent to compare however improved from PCP office at 8.0 Metabolic panel with hypokalemia at 2.8, hyper glycemia to 122, creatinine 1.88 mild elevation LFTs however no elevation in alk phos, bili.  Negative Murphy sign Pregnancy test negative EKG with prolonged QT interval however no STEMI QTC likely due to electrolyte abnormality patient made aware she will need follow-up outpatient for this Ultrasound with thickened endometrium.  I discussed this with patient.  Will need repeat ultrasound and follow-up with OB/GYN  Patient reassessed.  Orthostatic vital signs negative.  Pain improved with Toradol.  She is calm potassium and magnesium supplementation.  Will start on oral iron.  Discussed Lysteda for vaginal bleeding.  She has no risk factors for PE or DVT or known clotting disorders or family history of clotting disorders.  I discussed risk versus benefit.  She voiced understanding of risk versus  benefit would like to be started on Lysteda.  Will place referral for outpatient OB/GYN follow-up.  Tachycardia significantly improved with fluids. BP meds given here for HTN however low suspicion for hypertensive emergency or urgency.  The patient has been appropriately medically screened and/or stabilized in the ED. I have low suspicion for any other emergent medical condition which would require further screening, evaluation or treatment in the ED or require inpatient management.  Patient is hemodynamically stable and in no acute distress.  Patient able to ambulate in department prior to ED.  Evaluation does not show acute pathology that would require ongoing or additional emergent interventions while in the emergency department or further inpatient treatment.  I have discussed the diagnosis with the patient and answered all questions.  Pain is been managed while in the emergency department and patient has no further complaints prior to discharge.  Patient is comfortable with plan discussed in room and is stable for discharge at this time.  I have discussed strict return precautions for returning to the emergency department.  Patient was encouraged to follow-up with PCP/specialist refer to at discharge.     MDM Rules/Calculators/A&P                          Final Clinical Impression(s) / ED Diagnoses Final diagnoses:  Hypokalemia  Prolonged QT interval  Abnormal uterine bleeding (AUB)    Rx / DC Orders ED Discharge Orders         Ordered    Ambulatory referral to Obstetrics / Gynecology     Discontinue  Reprint     05/30/20 0255    ferrous sulfate 325 (65 FE) MG tablet  Daily     Discontinue  Reprint     05/30/20 0257    tranexamic acid (LYSTEDA) 650 MG TABS tablet  3 times daily     Discontinue  Reprint     05/30/20 0257           Roneshia Drew A, PA-C 05/30/20 0257    Quintella Reichert, MD 05/30/20 1453

## 2020-05-30 LAB — ABO/RH: ABO/RH(D): A POS

## 2020-05-30 MED ORDER — METOPROLOL TARTRATE 25 MG PO TABS
25.0000 mg | ORAL_TABLET | Freq: Once | ORAL | Status: AC
  Administered 2020-05-30: 25 mg via ORAL
  Filled 2020-05-30: qty 1

## 2020-05-30 MED ORDER — TRANEXAMIC ACID 650 MG PO TABS: 1300.0000 mg | ORAL_TABLET | Freq: Three times a day (TID) | ORAL | 0 refills | Status: AC

## 2020-05-30 MED ORDER — FERROUS SULFATE 325 (65 FE) MG PO TABS
325.0000 mg | ORAL_TABLET | Freq: Every day | ORAL | 0 refills | Status: DC
Start: 2020-05-30 — End: 2021-06-07

## 2020-05-30 MED ORDER — KETOROLAC TROMETHAMINE 30 MG/ML IJ SOLN
30.0000 mg | Freq: Once | INTRAMUSCULAR | Status: AC
  Administered 2020-05-30: 30 mg via INTRAVENOUS
  Filled 2020-05-30: qty 1

## 2020-05-30 MED ORDER — SODIUM CHLORIDE 0.9 % IV BOLUS
1000.0000 mL | Freq: Once | INTRAVENOUS | Status: AC
  Administered 2020-05-30: 1000 mL via INTRAVENOUS

## 2020-05-30 NOTE — Discharge Instructions (Signed)
Follow up with Obgyn for your bleeding.  Start on the iron tablets.  Start on the [place] for your bleeding. If any Chest pain, shortness of breath, unilateral leg swelling stop taking this medication and seek help at the ED.   You will need repeat EKG at your PCP for your prolonged QT interval

## 2020-06-06 ENCOUNTER — Other Ambulatory Visit: Payer: Self-pay | Admitting: Physician Assistant

## 2020-06-06 ENCOUNTER — Other Ambulatory Visit (HOSPITAL_COMMUNITY): Payer: Self-pay | Admitting: Physician Assistant

## 2020-08-01 ENCOUNTER — Ambulatory Visit: Payer: Medicaid Other | Admitting: Obstetrics and Gynecology

## 2020-08-18 ENCOUNTER — Inpatient Hospital Stay (HOSPITAL_COMMUNITY)
Admission: EM | Admit: 2020-08-18 | Discharge: 2020-08-27 | DRG: 439 | Disposition: A | Discharge status: Home / Self Care | Payer: Medicaid Other | Attending: Internal Medicine | Admitting: Internal Medicine

## 2020-08-18 ENCOUNTER — Emergency Department (HOSPITAL_COMMUNITY): Payer: Medicaid Other

## 2020-08-18 ENCOUNTER — Other Ambulatory Visit: Payer: Self-pay

## 2020-08-18 ENCOUNTER — Encounter (HOSPITAL_COMMUNITY): Payer: Self-pay | Admitting: *Deleted

## 2020-08-18 DIAGNOSIS — K567 Ileus, unspecified: Secondary | ICD-10-CM | POA: Diagnosis present

## 2020-08-18 DIAGNOSIS — R6510 Systemic inflammatory response syndrome (SIRS) of non-infectious origin without acute organ dysfunction: Secondary | ICD-10-CM

## 2020-08-18 DIAGNOSIS — D6959 Other secondary thrombocytopenia: Secondary | ICD-10-CM | POA: Diagnosis present

## 2020-08-18 DIAGNOSIS — F32A Depression, unspecified: Secondary | ICD-10-CM

## 2020-08-18 DIAGNOSIS — E8809 Other disorders of plasma-protein metabolism, not elsewhere classified: Secondary | ICD-10-CM | POA: Diagnosis present

## 2020-08-18 DIAGNOSIS — I10 Essential (primary) hypertension: Secondary | ICD-10-CM | POA: Diagnosis not present

## 2020-08-18 DIAGNOSIS — D649 Anemia, unspecified: Secondary | ICD-10-CM

## 2020-08-18 DIAGNOSIS — E876 Hypokalemia: Secondary | ICD-10-CM | POA: Diagnosis not present

## 2020-08-18 DIAGNOSIS — F1010 Alcohol abuse, uncomplicated: Secondary | ICD-10-CM | POA: Diagnosis present

## 2020-08-18 DIAGNOSIS — F329 Major depressive disorder, single episode, unspecified: Secondary | ICD-10-CM | POA: Diagnosis not present

## 2020-08-18 DIAGNOSIS — D509 Iron deficiency anemia, unspecified: Secondary | ICD-10-CM | POA: Diagnosis present

## 2020-08-18 DIAGNOSIS — D696 Thrombocytopenia, unspecified: Secondary | ICD-10-CM

## 2020-08-18 DIAGNOSIS — R6881 Early satiety: Secondary | ICD-10-CM | POA: Diagnosis present

## 2020-08-18 DIAGNOSIS — K852 Alcohol induced acute pancreatitis without necrosis or infection: Secondary | ICD-10-CM | POA: Diagnosis not present

## 2020-08-18 DIAGNOSIS — Z20822 Contact with and (suspected) exposure to covid-19: Secondary | ICD-10-CM | POA: Diagnosis present

## 2020-08-18 DIAGNOSIS — R9431 Abnormal electrocardiogram [ECG] [EKG]: Secondary | ICD-10-CM

## 2020-08-18 DIAGNOSIS — F17210 Nicotine dependence, cigarettes, uncomplicated: Secondary | ICD-10-CM | POA: Diagnosis present

## 2020-08-18 DIAGNOSIS — Z6838 Body mass index (BMI) 38.0-38.9, adult: Secondary | ICD-10-CM

## 2020-08-18 DIAGNOSIS — Z79899 Other long term (current) drug therapy: Secondary | ICD-10-CM

## 2020-08-18 DIAGNOSIS — Z720 Tobacco use: Secondary | ICD-10-CM

## 2020-08-18 DIAGNOSIS — E669 Obesity, unspecified: Secondary | ICD-10-CM | POA: Diagnosis present

## 2020-08-18 LAB — RAPID URINE DRUG SCREEN, HOSP PERFORMED
Amphetamines: NOT DETECTED
Barbiturates: NOT DETECTED
Benzodiazepines: NOT DETECTED
Cocaine: NOT DETECTED
Opiates: POSITIVE — AB
Tetrahydrocannabinol: NOT DETECTED

## 2020-08-18 LAB — COMPREHENSIVE METABOLIC PANEL
ALT: 45 U/L — ABNORMAL HIGH (ref 0–44)
AST: 79 U/L — ABNORMAL HIGH (ref 15–41)
Albumin: 3.4 g/dL — ABNORMAL LOW (ref 3.5–5.0)
Alkaline Phosphatase: 57 U/L (ref 38–126)
Anion gap: 19 — ABNORMAL HIGH (ref 5–15)
BUN: 10 mg/dL (ref 6–20)
CO2: 23 mmol/L (ref 22–32)
Calcium: 8.2 mg/dL — ABNORMAL LOW (ref 8.9–10.3)
Chloride: 95 mmol/L — ABNORMAL LOW (ref 98–111)
Creatinine, Ser: 1.05 mg/dL — ABNORMAL HIGH (ref 0.44–1.00)
GFR calc Af Amer: >60 mL/min (ref >60)
GFR calc non Af Amer: >60 mL/min (ref >60)
Glucose, Bld: 128 mg/dL — ABNORMAL HIGH (ref 70–99)
Potassium: 2.8 mmol/L — ABNORMAL LOW (ref 3.5–5.1)
Sodium: 137 mmol/L (ref 135–145)
Total Bilirubin: 4.6 mg/dL — ABNORMAL HIGH (ref 0.3–1.2)
Total Protein: 7.3 g/dL (ref 6.5–8.1)

## 2020-08-18 LAB — PHOSPHORUS: Phosphorus: 2.9 mg/dL (ref 2.5–4.6)

## 2020-08-18 LAB — BASIC METABOLIC PANEL
Anion gap: 15 (ref 5–15)
BUN: 11 mg/dL (ref 6–20)
CO2: 22 mmol/L (ref 22–32)
Calcium: 7 mg/dL — ABNORMAL LOW (ref 8.9–10.3)
Chloride: 99 mmol/L (ref 98–111)
Creatinine, Ser: 1.06 mg/dL — ABNORMAL HIGH (ref 0.44–1.00)
GFR calc Af Amer: >60 mL/min (ref >60)
GFR calc non Af Amer: >60 mL/min (ref >60)
Glucose, Bld: 137 mg/dL — ABNORMAL HIGH (ref 70–99)
Potassium: 2.8 mmol/L — ABNORMAL LOW (ref 3.5–5.1)
Sodium: 136 mmol/L (ref 135–145)

## 2020-08-18 LAB — SARS CORONAVIRUS 2 BY RT PCR (HOSPITAL ORDER, PERFORMED IN ~~LOC~~ HOSPITAL LAB): SARS Coronavirus 2: NEGATIVE

## 2020-08-18 LAB — URINALYSIS, ROUTINE W REFLEX MICROSCOPIC
Bilirubin Urine: NEGATIVE
Glucose, UA: NEGATIVE mg/dL
Hgb urine dipstick: NEGATIVE
Ketones, ur: NEGATIVE mg/dL
Nitrite: NEGATIVE
Protein, ur: 30 mg/dL — AB
Specific Gravity, Urine: >1.046 — ABNORMAL HIGH (ref 1.005–1.030)
pH: 5 (ref 5.0–8.0)

## 2020-08-18 LAB — MAGNESIUM
Magnesium: 1 mg/dL — ABNORMAL LOW (ref 1.7–2.4)
Magnesium: 1.9 mg/dL (ref 1.7–2.4)

## 2020-08-18 LAB — CBC
HCT: 38.9 % (ref 36.0–46.0)
Hemoglobin: 12.1 g/dL (ref 12.0–15.0)
MCH: 25.6 pg — ABNORMAL LOW (ref 26.0–34.0)
MCHC: 31.1 g/dL (ref 30.0–36.0)
MCV: 82.4 fL (ref 80.0–100.0)
Platelets: 219 10*3/uL (ref 150–400)
RBC: 4.72 MIL/uL (ref 3.87–5.11)
RDW: 17.6 % — ABNORMAL HIGH (ref 11.5–15.5)
WBC: 11.1 10*3/uL — ABNORMAL HIGH (ref 4.0–10.5)
nRBC: 0 % (ref 0.0–0.2)

## 2020-08-18 LAB — I-STAT BETA HCG BLOOD, ED (MC, WL, AP ONLY): I-stat hCG, quantitative: <5 m[IU]/mL (ref <5)

## 2020-08-18 LAB — LIPASE, BLOOD: Lipase: 3901 U/L — ABNORMAL HIGH (ref 11–51)

## 2020-08-18 LAB — ETHANOL: Alcohol, Ethyl (B): <10 mg/dL (ref <10)

## 2020-08-18 LAB — HIV ANTIBODY (ROUTINE TESTING W REFLEX): HIV Screen 4th Generation wRfx: NONREACTIVE

## 2020-08-18 MED ORDER — ENOXAPARIN SODIUM 40 MG/0.4ML ~~LOC~~ SOLN
40.0000 mg | SUBCUTANEOUS | Status: DC
  Administered 2020-08-18 – 2020-08-20 (×2): 40 mg via SUBCUTANEOUS
  Filled 2020-08-18 (×3): qty 0.4

## 2020-08-18 MED ORDER — HYDROMORPHONE HCL 1 MG/ML IJ SOLN
1.0000 mg | Freq: Once | INTRAMUSCULAR | Status: AC
  Administered 2020-08-18: 1 mg via INTRAVENOUS
  Filled 2020-08-18: qty 1

## 2020-08-18 MED ORDER — HYDROMORPHONE HCL 1 MG/ML IJ SOLN
1.0000 mg | INTRAMUSCULAR | Status: DC | PRN
  Administered 2020-08-18 – 2020-08-19 (×6): 1 mg via INTRAVENOUS
  Filled 2020-08-18 (×6): qty 1

## 2020-08-18 MED ORDER — NICOTINE 14 MG/24HR TD PT24
14.0000 mg | MEDICATED_PATCH | Freq: Every day | TRANSDERMAL | Status: DC
  Administered 2020-08-18 – 2020-08-27 (×10): 14 mg via TRANSDERMAL
  Filled 2020-08-18 (×10): qty 1

## 2020-08-18 MED ORDER — THIAMINE HCL 100 MG PO TABS
100.0000 mg | ORAL_TABLET | Freq: Every day | ORAL | Status: DC
  Administered 2020-08-20 – 2020-08-27 (×6): 100 mg via ORAL
  Filled 2020-08-18 (×8): qty 1

## 2020-08-18 MED ORDER — DIPHENHYDRAMINE HCL 50 MG/ML IJ SOLN: 25.0000 mg | Freq: Once | INTRAMUSCULAR | Status: DC

## 2020-08-18 MED ORDER — EMETROL 1.87-1.87-21.5 PO SOLN
15.0000 mL | Freq: Once | ORAL | Status: AC
  Administered 2020-08-18: 15 mL via ORAL
  Filled 2020-08-18: qty 118

## 2020-08-18 MED ORDER — FOLIC ACID 1 MG PO TABS
1.0000 mg | ORAL_TABLET | Freq: Every day | ORAL | Status: DC
  Filled 2020-08-18 (×2): qty 1

## 2020-08-18 MED ORDER — LORAZEPAM 2 MG/ML IJ SOLN
1.0000 mg | INTRAMUSCULAR | Status: DC | PRN
  Administered 2020-08-19 – 2020-08-20 (×2): 2 mg via INTRAVENOUS
  Filled 2020-08-18 (×2): qty 1

## 2020-08-18 MED ORDER — POTASSIUM CHLORIDE 10 MEQ/100ML IV SOLN
10.0000 meq | INTRAVENOUS | Status: AC
  Filled 2020-08-18: qty 100

## 2020-08-18 MED ORDER — POTASSIUM CHLORIDE 10 MEQ/100ML IV SOLN: 10.0000 meq | INTRAVENOUS | Status: DC

## 2020-08-18 MED ORDER — POTASSIUM CHLORIDE CRYS ER 20 MEQ PO TBCR
20.0000 meq | EXTENDED_RELEASE_TABLET | Freq: Once | ORAL | Status: AC
  Administered 2020-08-18: 20 meq via ORAL
  Filled 2020-08-18: qty 1

## 2020-08-18 MED ORDER — MAGNESIUM SULFATE 2 GM/50ML IV SOLN
2.0000 g | Freq: Once | INTRAVENOUS | Status: AC
  Administered 2020-08-18: 2 g via INTRAVENOUS
  Filled 2020-08-18: qty 50

## 2020-08-18 MED ORDER — ACETAMINOPHEN 325 MG PO TABS
650.0000 mg | ORAL_TABLET | Freq: Four times a day (QID) | ORAL | Status: DC | PRN
  Administered 2020-08-22 – 2020-08-24 (×2): 650 mg via ORAL
  Filled 2020-08-18 (×3): qty 2

## 2020-08-18 MED ORDER — ACETAMINOPHEN 650 MG RE SUPP: 650.0000 mg | Freq: Four times a day (QID) | RECTAL | Status: DC | PRN

## 2020-08-18 MED ORDER — SODIUM CHLORIDE 0.9 % IV BOLUS
1000.0000 mL | Freq: Once | INTRAVENOUS | Status: AC
  Administered 2020-08-18: 1000 mL via INTRAVENOUS

## 2020-08-18 MED ORDER — POTASSIUM CHLORIDE 10 MEQ/100ML IV SOLN
10.0000 meq | INTRAVENOUS | Status: AC
  Administered 2020-08-18 (×5): 10 meq via INTRAVENOUS
  Filled 2020-08-18 (×4): qty 100

## 2020-08-18 MED ORDER — CARVEDILOL 6.25 MG PO TABS
6.2500 mg | ORAL_TABLET | Freq: Two times a day (BID) | ORAL | Status: DC
  Administered 2020-08-18: 6.25 mg via ORAL
  Filled 2020-08-18: qty 1
  Filled 2020-08-18: qty 2

## 2020-08-18 MED ORDER — LORAZEPAM 2 MG/ML IJ SOLN: 1.0000 mg | INTRAMUSCULAR | Status: DC | PRN

## 2020-08-18 MED ORDER — THIAMINE HCL 100 MG/ML IJ SOLN
100.0000 mg | Freq: Every day | INTRAMUSCULAR | Status: DC
  Administered 2020-08-19 – 2020-08-22 (×3): 100 mg via INTRAVENOUS
  Filled 2020-08-18 (×3): qty 2

## 2020-08-18 MED ORDER — LORAZEPAM 1 MG PO TABS: 1.0000 mg | ORAL_TABLET | ORAL | Status: DC | PRN

## 2020-08-18 MED ORDER — IOHEXOL 300 MG/ML  SOLN
100.0000 mL | Freq: Once | INTRAMUSCULAR | Status: AC | PRN
  Administered 2020-08-18: 100 mL via INTRAVENOUS

## 2020-08-18 MED ORDER — DIPHENHYDRAMINE HCL 50 MG/ML IJ SOLN
25.0000 mg | Freq: Once | INTRAMUSCULAR | Status: AC
  Administered 2020-08-18: 25 mg via INTRAVENOUS
  Filled 2020-08-18: qty 1

## 2020-08-18 MED ORDER — THIAMINE HCL 100 MG/ML IJ SOLN
Freq: Once | INTRAVENOUS | Status: AC
  Filled 2020-08-18: qty 1000

## 2020-08-18 MED ORDER — AMLODIPINE BESYLATE 5 MG PO TABS
5.0000 mg | ORAL_TABLET | Freq: Every day | ORAL | Status: DC
  Administered 2020-08-18: 5 mg via ORAL
  Filled 2020-08-18 (×2): qty 1

## 2020-08-18 MED ORDER — LORAZEPAM 2 MG/ML IJ SOLN
1.0000 mg | Freq: Once | INTRAMUSCULAR | Status: AC
  Administered 2020-08-18: 1 mg via INTRAVENOUS
  Filled 2020-08-18: qty 1

## 2020-08-18 MED ORDER — ADULT MULTIVITAMIN W/MINERALS CH
1.0000 | ORAL_TABLET | Freq: Every day | ORAL | Status: DC
  Administered 2020-08-23 – 2020-08-27 (×5): 1 via ORAL
  Filled 2020-08-18 (×8): qty 1

## 2020-08-18 MED ORDER — METOPROLOL TARTRATE 5 MG/5ML IV SOLN
5.0000 mg | Freq: Four times a day (QID) | INTRAVENOUS | Status: DC | PRN
  Administered 2020-08-19: 5 mg via INTRAVENOUS
  Filled 2020-08-18: qty 5

## 2020-08-18 MED ORDER — HYDROCODONE-ACETAMINOPHEN 5-325 MG PO TABS
1.0000 | ORAL_TABLET | ORAL | Status: DC | PRN
  Administered 2020-08-18: 1 via ORAL
  Filled 2020-08-18: qty 1

## 2020-08-18 NOTE — ED Triage Notes (Signed)
Lower abdominal pain and "pressure" radiates to the back, since last night. Vomiting, "feels weak and dizzy". No vaginal discharge, bleeding or urinary problems. LBM yesterday. Last period in July, says she has had negative pregnancy test. HR 140-150's in triage.

## 2020-08-18 NOTE — H&P (Signed)
History and Physical        Hospital Admission Note Date: 08/18/2020  Patient name: Robin Guerrero Medical record number: [id number] Date of birth: [birth year] Age: 34 y.o. Gender: female  PCP: Trey Sailors, PA  Patient coming from: home Lives with: family At baseline, ambulates: independently  Chief Complaint    Chief Complaint  Patient presents with  . Abdominal Pain      HPI:   This is a 34 year old female with past medical history of iron deficiency anemia, hypertension, alcohol abuse, depression who presented to the ED with abdominal pain, nausea and vomiting x1 day.  States that yesterday morning she began having lower abdominal pain with radiation to her back described as bloating which became more severe and persistent throughout the day and included nausea and vomiting.  She has been unable to keep any food down but has been able to tolerate liquids.  Reports that over the past year she has been drinking 1 to 2 cups of vodka nightly since her mother's death last year from breast cancer.  She is increased her alcohol intake to 2 to 3 cups of vodka nightly over the past 1 month.  Last drink was Wednesday evening.  Denies any history of alcohol withdrawal symptoms.  States that she is very interested in quitting and has recently signed up for therapy which she is not gone to yet.  Also smokes about 6 to 10 cigarettes daily and is also interested in quitting.  She is requesting to advance her diet to clear liquids.  ED Course: Vitals: Afebrile, HR 147, RR 24, BP 135/94, SPO2 98% on room air.  Notable labs: K2.8, chloride 95, glucose 128, creatinine 1.05 (baseline 0.8), calcium 8.2, anion gap 19, magnesium 1.0, lipase 3901, AST 79, ALT 45, T bili 4.6, WBC 11.1.  Initial EKG with sinus tachycardia and prolonged QTC 623, improved to 465 on repeat EKG however it looks  significantly prolonged on personal read.  CT abdomen pelvis with moderate noncomplicated pancreatitis with secondary small volume ascites, hepatic steatosis, uterine fibroid and emphysema on CT.  Dilaudid 1 mg x 1, magnesium sulfate 2 g x 2, KCl 60 mEq, 2 L NS bolus.  Vitals:   08/18/20 0651 08/18/20 0840  BP: (!) 135/94 129/82  Pulse: (!) 147 (!) 117  Resp: (!) 24 (!) 23  Temp: 98.2 F (36.8 C)   SpO2: 98% 92%     Review of Systems:  Review of Systems  Constitutional: Negative for chills and fever.  Respiratory: Negative for cough and shortness of breath.   Cardiovascular: Negative for chest pain, palpitations and leg swelling.  Gastrointestinal: Positive for abdominal pain, nausea and vomiting. Negative for diarrhea.  Genitourinary: Negative for dysuria.  Musculoskeletal: Negative for myalgias.  Neurological: Negative for headaches.  All other systems reviewed and are negative.   Medical/Social/Family History   Past Medical History: Past Medical History:  Diagnosis Date  . History of anemia    no current med.  . Hypertension    states under control with med., has been on med. x 3 yr.    Past Surgical History:  Procedure Laterality Date  . FOREIGN BODY REMOVAL Left 02/10/2015   Procedure: ATTEMPTED EXPLANTATION  OF BIRTH CONTROL DEVICE LEFT ARM;  Surgeon: Johnathan Hausen, MD;  Location: [facility];  Service: General;  Laterality: Left;  . FOREIGN BODY REMOVAL Left 05/12/2015   Procedure: REMOVAL OF LEFT ARM BIRTH CONTROL DEVICE;  Surgeon: Johnathan Hausen, MD;  Location: [facility];  Service: General;  Laterality: Left;  . SUBMANDIBULAR GLAND EXCISION Right 05/01/2009    Medications: Prior to Admission medications   Medication Sig Start Date End Date Taking? Authorizing Provider  amLODipine (NORVASC) 5 MG tablet Take 5 mg by mouth daily. 05/25/20  Yes [provider]  ferrous sulfate 325 (65 FE) MG tablet Take 1 tablet (325 mg total)  by mouth daily. 05/30/20  Yes Henderly, Britni A, PA-C  losartan-hydrochlorothiazide (HYZAAR) 100-25 MG tablet Take 1 tablet by mouth daily. 05/24/20  Yes [provider]  HYDROcodone-acetaminophen (NORCO) 5-325 MG per tablet Take 1 tablet by mouth every 4 (four) hours as needed for moderate pain. Patient not taking: Reported on 05/30/2020 05/12/15   Johnathan Hausen, MD    Allergies:  No Known Allergies  Social History:  reports that she has been smoking cigarettes. She has smoked for the past 5.00 years. She has never used smokeless tobacco. She reports current alcohol use. She reports that she does not use drugs.  Family History: Family History  Problem Relation Age of Onset  . Breast cancer Mother 56     Objective   Physical Exam: Blood pressure 129/82, pulse (!) 117, temperature 98.2 F (36.8 C), resp. rate (!) 23, SpO2 92 %.  Physical Exam Vitals and nursing note reviewed.  Constitutional:      Appearance: She is obese.     Comments: Appears uncomfortable  HENT:     Head: Normocephalic.  Cardiovascular:     Rate and Rhythm: Regular rhythm. Tachycardia present.  Pulmonary:     Effort: Pulmonary effort is normal.     Breath sounds: Normal breath sounds.  Abdominal:     General: Abdomen is flat.     Tenderness: There is abdominal tenderness in the epigastric area.  Musculoskeletal:     Right lower leg: No swelling.     Left lower leg: No swelling.  Skin:    Coloration: Skin is not cyanotic or jaundiced.  Neurological:     General: No focal deficit present.     Mental Status: She is alert.  Psychiatric:        Mood and Affect: Mood normal.        Behavior: Behavior normal.     LABS on Admission: I have personally reviewed all the labs and imaging below    Basic Metabolic Panel: Recent Labs  Lab 08/18/20 0702 08/18/20 0709  NA 137  --   K 2.8*  --   CL 95*  --   CO2 23  --   GLUCOSE 128*  --   BUN 10  --   CREATININE 1.05*  --   CALCIUM 8.2*  --    MG  --  1.0*   Liver Function Tests: Recent Labs  Lab 08/18/20 0702  AST 79*  ALT 45*  ALKPHOS 57  BILITOT 4.6*  PROT 7.3  ALBUMIN 3.4*   Recent Labs  Lab 08/18/20 0702  LIPASE 3,901*   No results for input(s): AMMONIA in the last 168 hours. CBC: Recent Labs  Lab 08/18/20 0702  WBC 11.1*  HGB 12.1  HCT 38.9  MCV 82.4  PLT 219   Cardiac Enzymes: No results for  input(s): CKTOTAL, CKMB, CKMBINDEX, TROPONINI in the last 168 hours. BNP: Invalid input(s): POCBNP CBG: No results for input(s): GLUCAP in the last 168 hours.  Radiological Exams on Admission:  CT ABDOMEN PELVIS W CONTRAST  Result Date: 08/18/2020 CLINICAL DATA:  Epigastric abdominal pain. Nausea and vomiting. Elevated white blood cell count. Hypertension and anemia. EXAM: CT ABDOMEN AND PELVIS WITH CONTRAST TECHNIQUE: Multidetector CT imaging of the abdomen and pelvis was performed using the standard protocol following bolus administration of intravenous contrast. CONTRAST:  165mL OMNIPAQUE IOHEXOL 300 MG/ML  SOLN COMPARISON:  None. FINDINGS: Lower chest: Motion degradation throughout. Mild centrilobular emphysema. Mild cardiomegaly, without pericardial or pleural effusion. Hepatobiliary: Marked hepatic steatosis. Normal gallbladder and biliary tract. Pancreas: Pancreatic and peripancreatic edema are moderate. No pancreatic duct dilatation. Fluid throughout the anterior pararenal space. No well-defined fluid collection. No evidence of pancreatic necrosis. Spleen: Normal in size, without focal abnormality. Adrenals/Urinary Tract: Normal adrenal glands. Normal kidneys, without hydronephrosis. Normal urinary bladder. Stomach/Bowel: Normal stomach, without wall thickening. Normal colon and terminal ileum. Normal small bowel. Vascular/Lymphatic: Normal caliber of the aorta and branch vessels. Patent splenic, portal, hepatic veins. No abdominopelvic adenopathy. Reproductive: Exophytic posterior uterine fibroid of 3.7 cm on  62/3. Other: Small volume intraperitoneal fluid, including within the pelvic cul-de-sac. Musculoskeletal: No acute osseous abnormality. IMPRESSION: 1. Moderate non complicated pancreatitis. Secondary small volume ascites. 2. Hepatic steatosis. 3. Uterine fibroid. 4. Motion degradation. 5. Emphysema (ICD10-J43.9). Electronically Signed   By: Abigail Miyamoto M.D.   On: 08/18/2020 08:52      EKG: Independently reviewed.  Initial EKG with sinus tachycardia and prolonged QTC 623, improved to 465 on repeat EKG however it looks significantly prolonged on personal read.   A & P   Principal Problem:   Alcoholic pancreatitis Active Problems:   SIRS (systemic inflammatory response syndrome) (HCC)   Hypokalemia   Hypomagnesemia   Prolonged QT interval   Essential hypertension   Depression   Tobacco use   1. SIRS secondary to alcoholic pancreatitis a. SIRS criteria: Tachycardia, tachypnea with leukocytosis b. Lipase 3000+ c. Continue IV fluids d. Banana bag e. Patient requesting to advance diet, clear liquid diet trial f. Hold off on antiemetics due to prolonged QTC g. Follow-up lipase in a.m.  2. Hypomagnesemia  Hypokalemia a. Replete via IV b. Holding lisinopril-HCTZ c. Follow-up afternoon and in a.m.  3. Prolonged QTc a. Likely secondary to electrolyte abnormalities b. Correct electrolytes c. Continue telemetry d. Hold QT prolonging agents  4. Alcohol abuse a. Seems she is self-medicating with alcohol due to depression from her mother's death 1 year ago.  She is already begun seeking out help b. Encourage cessation c. CIWA protocol d. SW consult to assist with any barriers  5. Tobacco use a. She is trying to quit and asking for resources b. Emphysematous changes on CT c. Encouraged cessation d. Nicotine patch e. Consider starting Wellbutrin once she is out of this acute window and QT has normalized vs. Chantix with outpatient follow-up  6. Hypertension a. Continue  amlodipine b. Hold lisinopril-HCTZ c. As needed metoprolol  7. Depression a. Self-medicating with alcohol b. She has been actively seeking out resources c. No signs/symptoms of imminent self harm requiring psychiatry consult d. Close outpatient follow-up   DVT prophylaxis: Lovenox   Code Status: Full Code  Diet: Clear liquid diet Family Communication: Admission, patients condition and plan of care including tests being ordered have been discussed with the patient who indicates understanding and agrees with the plan  and Code Status.  Disposition Plan: The appropriate patient status for this patient is OBSERVATION. Observation status is judged to be reasonable and necessary in order to provide the required intensity of service to ensure the patient's safety. The patient's presenting symptoms, physical exam findings, and initial radiographic and laboratory data in the context of their medical condition is felt to place them at decreased risk for further clinical deterioration. Furthermore, it is anticipated that the patient will be medically stable for discharge from the hospital within 2 midnights of admission. The following factors support the patient status of observation.   " The patient's presenting symptoms include abdominal pain, nausea and vomiting. " The physical exam findings include abdominal pain. " The initial radiographic and laboratory data are elevated lipase, pancreatitis on CT    Status is: Observation  The patient remains OBS appropriate and will d/c before 2 midnights.  Dispo: The patient is from: Home              Anticipated d/c is to: Home              Anticipated d/c date is: 1 day              Patient currently is not medically stable to d/c.    Consultants  . None  Procedures  . None  Time Spent on Admission: 65 minutes    Harold Hedge, DO [facility] Pager [phone] 08/18/2020, 10:00 AM

## 2020-08-18 NOTE — Progress Notes (Signed)
Called ED to get report. I was unable to reach anyone. Will wait for a call back for report.

## 2020-08-18 NOTE — ED Provider Notes (Addendum)
[name] DEPT Provider Note   CSN: [id number] Arrival date & time: 08/18/20  0636     History Chief Complaint  Patient presents with  . Abdominal Pain    Robin Guerrero is a 34 y.o. female.  Robin Guerrero is a 34 yr old female with PMH of anemia and HTN  presents today for abdominal pain. Pain started yesterday in LLQ and described as a "pressure type pain" and "feels like when you eat too much and stuff yourself".  Constant in nature and 10/10 severity. Tried ibuprofen, peptobismal, muscle relaxers yesterday which did not help. No other alleviating factors. Exacerbating factors includes movement. Had a similar episode a few weeks ago but went away. Associated with nausea and approximately 5 episodes of vomiting. Drinks 2-3 cups of vodka a day. Last drink was day before yesterday. Started drinking 1 year ago when her mom died. Denies hematemesis. Denies chest pain, dyspnea, palpitations, dysuria, frequency, hematuria, diarrhea, constipation, melena, rectal bleeding or fevers. Sexually active but not on birth control. Unsure whether she could be pregnant. Denies history of renal stones. LMP was in July but does not know date. Cycles are usually once a month and regular.           Past Medical History:  Diagnosis Date  . History of anemia    no current med.  . Hypertension    states under control with med., has been on med. x 3 yr.    There are no problems to display for this patient.   Past Surgical History:  Procedure Laterality Date  . FOREIGN BODY REMOVAL Left 02/10/2015   Procedure: ATTEMPTED EXPLANTATION OF BIRTH CONTROL DEVICE LEFT ARM;  Surgeon: Johnathan Hausen, MD;  Location: [facility];  Service: General;  Laterality: Left;  . FOREIGN BODY REMOVAL Left 05/12/2015   Procedure: REMOVAL OF LEFT ARM BIRTH CONTROL DEVICE;  Surgeon: Johnathan Hausen, MD;  Location: [facility];  Service: General;  Laterality:  Left;  . SUBMANDIBULAR GLAND EXCISION Right 05/01/2009     OB History   No obstetric history on file.     Family History  Problem Relation Age of Onset  . Breast cancer Mother 64    Social History   Tobacco Use  . Smoking status: Current Every Day Smoker    Years: 5.00    Types: Cigarettes  . Smokeless tobacco: Never Used  . Tobacco comment: 1 pack/week  Substance Use Topics  . Alcohol use: Yes    Comment: occasionally  . Drug use: No    Home Medications Prior to Admission medications   Medication Sig Start Date End Date Taking? Authorizing Provider  amLODipine (NORVASC) 5 MG tablet Take 5 mg by mouth daily. 05/25/20   [provider]  ferrous sulfate 325 (65 FE) MG tablet Take 1 tablet (325 mg total) by mouth daily. 05/30/20   Henderly, Britni A, PA-C  HYDROcodone-acetaminophen (NORCO) 5-325 MG per tablet Take 1 tablet by mouth every 4 (four) hours as needed for moderate pain. Patient not taking: Reported on 05/30/2020 05/12/15   Johnathan Hausen, MD  losartan-hydrochlorothiazide [facility]) 100-25 MG tablet Take 1 tablet by mouth daily. 05/24/20   [provider]    Allergies    Patient has no known allergies.  Review of Systems   Review of Systems  Constitutional: Positive for appetite change. Negative for chills and fever.  Cardiovascular: Negative for chest pain and palpitations.  Gastrointestinal: Positive for abdominal pain, nausea and vomiting.  Negative for abdominal distention, blood in stool, constipation and diarrhea.  Genitourinary: Negative for difficulty urinating, dysuria and hematuria.  Skin: Negative for color change.    Physical Exam Updated Vital Signs BP 129/82 (BP Location: Right Arm)   Pulse (!) 117   Temp 98.2 F (36.8 C)   Resp (!) 23   SpO2 92%   Physical Exam Constitutional:      General: She is in acute distress.     Appearance: She is obese.  HENT:     Head: Normocephalic and atraumatic.  Eyes:     Extraocular  Movements: Extraocular movements intact.  Cardiovascular:     Rate and Rhythm: Tachycardia present.     Heart sounds: Normal heart sounds.  Pulmonary:     Effort: Pulmonary effort is normal.     Breath sounds: No wheezing or rales.  Abdominal:     Palpations: Abdomen is soft.     Tenderness: There is generalized abdominal tenderness and tenderness in the epigastric area.  Skin:    General: Skin is warm and dry.  Neurological:     Mental Status: She is alert.     ED Results / Procedures / Treatments   Labs (all labs ordered are listed, but only abnormal results are displayed) Labs Reviewed  LIPASE, BLOOD - Abnormal; Notable for the following components:      Result Value   Lipase 3,901 (*)    All other components within normal limits  COMPREHENSIVE METABOLIC PANEL - Abnormal; Notable for the following components:   Potassium 2.8 (*)    Chloride 95 (*)    Glucose, Bld 128 (*)    Creatinine, Ser 1.05 (*)    Calcium 8.2 (*)    Albumin 3.4 (*)    AST 79 (*)    ALT 45 (*)    Total Bilirubin 4.6 (*)    Anion gap 19 (*)    All other components within normal limits  CBC - Abnormal; Notable for the following components:   WBC 11.1 (*)    MCH 25.6 (*)    RDW 17.6 (*)    All other components within normal limits  MAGNESIUM - Abnormal; Notable for the following components:   Magnesium 1.0 (*)    All other components within normal limits  URINALYSIS, ROUTINE W REFLEX MICROSCOPIC  RAPID URINE DRUG SCREEN, HOSP PERFORMED  ETHANOL  I-STAT BETA HCG BLOOD, ED ([facility], [facility], [facility] ONLY)    EKG EKG Interpretation  Date/Time:  Friday August 18 2020 08:39:53 EDT Ventricular Rate:  115 PR Interval:    QRS Duration: 79 QT Interval:  336 QTC Calculation: 465 R Axis:   69 Text Interpretation: Sinus tachycardia Low voltage, precordial leads Borderline repolarization abnormality Confirmed by Madalyn Rob [phone]) on 08/18/2020 8:44:54 AM   Radiology CT ABDOMEN PELVIS W  CONTRAST  Result Date: 08/18/2020 CLINICAL DATA:  Epigastric abdominal pain. Nausea and vomiting. Elevated white blood cell count. Hypertension and anemia. EXAM: CT ABDOMEN AND PELVIS WITH CONTRAST TECHNIQUE: Multidetector CT imaging of the abdomen and pelvis was performed using the standard protocol following bolus administration of intravenous contrast. CONTRAST:  146mL OMNIPAQUE IOHEXOL 300 MG/ML  SOLN COMPARISON:  None. FINDINGS: Lower chest: Motion degradation throughout. Mild centrilobular emphysema. Mild cardiomegaly, without pericardial or pleural effusion. Hepatobiliary: Marked hepatic steatosis. Normal gallbladder and biliary tract. Pancreas: Pancreatic and peripancreatic edema are moderate. No pancreatic duct dilatation. Fluid throughout the anterior pararenal space. No well-defined fluid collection. No evidence of pancreatic necrosis. Spleen: Normal  in size, without focal abnormality. Adrenals/Urinary Tract: Normal adrenal glands. Normal kidneys, without hydronephrosis. Normal urinary bladder. Stomach/Bowel: Normal stomach, without wall thickening. Normal colon and terminal ileum. Normal small bowel. Vascular/Lymphatic: Normal caliber of the aorta and branch vessels. Patent splenic, portal, hepatic veins. No abdominopelvic adenopathy. Reproductive: Exophytic posterior uterine fibroid of 3.7 cm on 62/3. Other: Small volume intraperitoneal fluid, including within the pelvic cul-de-sac. Musculoskeletal: No acute osseous abnormality. IMPRESSION: 1. Moderate non complicated pancreatitis. Secondary small volume ascites. 2. Hepatic steatosis. 3. Uterine fibroid. 4. Motion degradation. 5. Emphysema (ICD10-J43.9). Electronically Signed   By: Abigail Miyamoto M.D.   On: 08/18/2020 08:52    Procedures Procedures (including critical care time)  Medications Ordered in ED Medications  potassium chloride 10 mEq in 100 mL IVPB (10 mEq Intravenous New Bag/Given 08/18/20 0900)  magnesium sulfate IVPB 2 g 50 mL (2 g  Intravenous New Bag/Given 08/18/20 0904)  HYDROmorphone (DILAUDID) injection 1 mg (has no administration in time range)  magnesium sulfate IVPB 2 g 50 mL (0 g Intravenous Stopped 08/18/20 0901)  sodium chloride 0.9 % bolus 1,000 mL (0 mLs Intravenous Stopped 08/18/20 0901)  HYDROmorphone (DILAUDID) injection 1 mg (1 mg Intravenous Given 08/18/20 0835)  iohexol (OMNIPAQUE) 300 MG/ML solution 100 mL (100 mLs Intravenous Contrast Given 08/18/20 0819)  sodium chloride 0.9 % bolus 1,000 mL (1,000 mLs Intravenous New Bag/Given 08/18/20 0901)    ED Course  I have reviewed the triage vital signs and the nursing notes.  Pertinent labs & imaging results that were available during my care of the patient were reviewed by me and considered in my medical decision making (see chart for details).    MDM Rules/Calculators/A&P                          Yessika Guardado is a 34 yr old female with PMH HTN and anema presents today for 1 day history of generalized abdominal pain. Drinks 2-3 cups of vodka a day for the last year since death of her mother. Last drink was 2 days ago. On exam pt is significantly tender in epigastrium. Vital signs: HR 140. Labs: CBC 11, K 2.8, Glucose 128, Cr 1.05, Mg 1, AST 79, ALT 45, Bili 4.6, Lipase 3901. Initial EKG: sinus tachycardia, QTC 623. bhCG negative. UA and UDS pending. CT abdomen shows: moderate non complicated pancreatitis with secondary volume ascites. S/p 2g magnesium sulphate, KCL 10 mEq X 6, 1L N.saline and 1mg  IV 1 mg Dilaudid. Have prescribed another 2g magnesium sulphate and 1L N. saline. Pt's symptoms and vital signs improved post fluid resuscitation and IV dilaudid.   Referred to hospitalist service at 9:09 am.  Final Clinical Impression(s) / ED Diagnoses Final diagnoses:  None    Rx / DC Orders ED Discharge Orders    None       Lattie Haw, MD. PGY-2 08/18/20 0165    Lattie Haw, MD 08/18/20 0930    Lucrezia Starch, MD 08/23/20 1539

## 2020-08-19 DIAGNOSIS — K852 Alcohol induced acute pancreatitis without necrosis or infection: Secondary | ICD-10-CM | POA: Diagnosis present

## 2020-08-19 DIAGNOSIS — R6510 Systemic inflammatory response syndrome (SIRS) of non-infectious origin without acute organ dysfunction: Secondary | ICD-10-CM | POA: Diagnosis not present

## 2020-08-19 DIAGNOSIS — Z6838 Body mass index (BMI) 38.0-38.9, adult: Secondary | ICD-10-CM | POA: Diagnosis not present

## 2020-08-19 DIAGNOSIS — Z79899 Other long term (current) drug therapy: Secondary | ICD-10-CM | POA: Diagnosis not present

## 2020-08-19 DIAGNOSIS — R6881 Early satiety: Secondary | ICD-10-CM | POA: Diagnosis present

## 2020-08-19 DIAGNOSIS — K567 Ileus, unspecified: Secondary | ICD-10-CM | POA: Diagnosis present

## 2020-08-19 DIAGNOSIS — I10 Essential (primary) hypertension: Secondary | ICD-10-CM | POA: Diagnosis present

## 2020-08-19 DIAGNOSIS — F329 Major depressive disorder, single episode, unspecified: Secondary | ICD-10-CM | POA: Diagnosis present

## 2020-08-19 DIAGNOSIS — E8809 Other disorders of plasma-protein metabolism, not elsewhere classified: Secondary | ICD-10-CM | POA: Diagnosis present

## 2020-08-19 DIAGNOSIS — Z20822 Contact with and (suspected) exposure to covid-19: Secondary | ICD-10-CM | POA: Diagnosis present

## 2020-08-19 DIAGNOSIS — F1010 Alcohol abuse, uncomplicated: Secondary | ICD-10-CM | POA: Diagnosis present

## 2020-08-19 DIAGNOSIS — F17210 Nicotine dependence, cigarettes, uncomplicated: Secondary | ICD-10-CM | POA: Diagnosis present

## 2020-08-19 DIAGNOSIS — D649 Anemia, unspecified: Secondary | ICD-10-CM

## 2020-08-19 DIAGNOSIS — D6959 Other secondary thrombocytopenia: Secondary | ICD-10-CM | POA: Diagnosis present

## 2020-08-19 DIAGNOSIS — E669 Obesity, unspecified: Secondary | ICD-10-CM | POA: Diagnosis present

## 2020-08-19 DIAGNOSIS — E876 Hypokalemia: Secondary | ICD-10-CM | POA: Diagnosis present

## 2020-08-19 DIAGNOSIS — D509 Iron deficiency anemia, unspecified: Secondary | ICD-10-CM | POA: Diagnosis present

## 2020-08-19 DIAGNOSIS — D696 Thrombocytopenia, unspecified: Secondary | ICD-10-CM

## 2020-08-19 DIAGNOSIS — R9431 Abnormal electrocardiogram [ECG] [EKG]: Secondary | ICD-10-CM | POA: Diagnosis present

## 2020-08-19 LAB — FERRITIN: Ferritin: 126 ng/mL (ref 11–307)

## 2020-08-19 LAB — COMPREHENSIVE METABOLIC PANEL
ALT: 31 U/L (ref 0–44)
ALT: 30 U/L (ref 0–44)
AST: 70 U/L — ABNORMAL HIGH (ref 15–41)
AST: 63 U/L — ABNORMAL HIGH (ref 15–41)
Albumin: 3 g/dL — ABNORMAL LOW (ref 3.5–5.0)
Albumin: 2.9 g/dL — ABNORMAL LOW (ref 3.5–5.0)
Alkaline Phosphatase: 49 U/L (ref 38–126)
Alkaline Phosphatase: 46 U/L (ref 38–126)
Anion gap: 13 (ref 5–15)
Anion gap: 12 (ref 5–15)
BUN: 12 mg/dL (ref 6–20)
BUN: 12 mg/dL (ref 6–20)
CO2: 21 mmol/L — ABNORMAL LOW (ref 22–32)
CO2: 22 mmol/L (ref 22–32)
Calcium: 6.4 mg/dL — CL (ref 8.9–10.3)
Calcium: 6.3 mg/dL — CL (ref 8.9–10.3)
Chloride: 102 mmol/L (ref 98–111)
Chloride: 100 mmol/L (ref 98–111)
Creatinine, Ser: 1.13 mg/dL — ABNORMAL HIGH (ref 0.44–1.00)
Creatinine, Ser: 1.07 mg/dL — ABNORMAL HIGH (ref 0.44–1.00)
GFR calc Af Amer: >60 mL/min (ref >60)
GFR calc Af Amer: >60 mL/min (ref >60)
GFR calc non Af Amer: >60 mL/min (ref >60)
GFR calc non Af Amer: >60 mL/min (ref >60)
Glucose, Bld: 106 mg/dL — ABNORMAL HIGH (ref 70–99)
Glucose, Bld: 98 mg/dL (ref 70–99)
Potassium: 3.2 mmol/L — ABNORMAL LOW (ref 3.5–5.1)
Potassium: 3.1 mmol/L — ABNORMAL LOW (ref 3.5–5.1)
Sodium: 136 mmol/L (ref 135–145)
Sodium: 134 mmol/L — ABNORMAL LOW (ref 135–145)
Total Bilirubin: 3.9 mg/dL — ABNORMAL HIGH (ref 0.3–1.2)
Total Bilirubin: 3.9 mg/dL — ABNORMAL HIGH (ref 0.3–1.2)
Total Protein: 6.6 g/dL (ref 6.5–8.1)
Total Protein: 6.2 g/dL — ABNORMAL LOW (ref 6.5–8.1)

## 2020-08-19 LAB — FOLATE: Folate: 5.2 ng/mL — ABNORMAL LOW (ref >5.9)

## 2020-08-19 LAB — IRON AND TIBC
Iron: 112 ug/dL (ref 28–170)
Saturation Ratios: 27 % (ref 10.4–31.8)
TIBC: 415 ug/dL (ref 250–450)
UIBC: 303 ug/dL

## 2020-08-19 LAB — MAGNESIUM: Magnesium: 2.3 mg/dL (ref 1.7–2.4)

## 2020-08-19 LAB — CBC
HCT: 36.4 % (ref 36.0–46.0)
Hemoglobin: 10.9 g/dL — ABNORMAL LOW (ref 12.0–15.0)
MCH: 25.3 pg — ABNORMAL LOW (ref 26.0–34.0)
MCHC: 29.9 g/dL — ABNORMAL LOW (ref 30.0–36.0)
MCV: 84.7 fL (ref 80.0–100.0)
Platelets: 129 10*3/uL — ABNORMAL LOW (ref 150–400)
RBC: 4.3 MIL/uL (ref 3.87–5.11)
RDW: 17.9 % — ABNORMAL HIGH (ref 11.5–15.5)
WBC: 18 10*3/uL — ABNORMAL HIGH (ref 4.0–10.5)
nRBC: 0.1 % (ref 0.0–0.2)

## 2020-08-19 LAB — LIPASE, BLOOD: Lipase: 1178 U/L — ABNORMAL HIGH (ref 11–51)

## 2020-08-19 LAB — VITAMIN B12: Vitamin B-12: 588 pg/mL (ref 180–914)

## 2020-08-19 MED ORDER — POTASSIUM CHLORIDE 10 MEQ/100ML IV SOLN
10.0000 meq | INTRAVENOUS | Status: DC
  Administered 2020-08-19 (×3): 10 meq via INTRAVENOUS

## 2020-08-19 MED ORDER — SODIUM CHLORIDE 0.9 % IV SOLN
INTRAVENOUS | Status: DC
  Administered 2020-08-20: 1000 mL via INTRAVENOUS

## 2020-08-19 MED ORDER — LABETALOL HCL 5 MG/ML IV SOLN
10.0000 mg | INTRAVENOUS | Status: DC | PRN
  Administered 2020-08-20 – 2020-08-25 (×14): 10 mg via INTRAVENOUS
  Filled 2020-08-19 (×11): qty 4

## 2020-08-19 MED ORDER — POTASSIUM CHLORIDE 10 MEQ/100ML IV SOLN
10.0000 meq | INTRAVENOUS | Status: AC
  Administered 2020-08-19: 10 meq via INTRAVENOUS
  Filled 2020-08-19 (×4): qty 100

## 2020-08-19 MED ORDER — MORPHINE SULFATE (PF) 2 MG/ML IV SOLN
2.0000 mg | INTRAVENOUS | Status: DC | PRN
  Administered 2020-08-19 – 2020-08-22 (×8): 2 mg via INTRAVENOUS
  Filled 2020-08-19 (×8): qty 1

## 2020-08-19 MED ORDER — FOLIC ACID 5 MG/ML IJ SOLN
1.0000 mg | Freq: Every day | INTRAMUSCULAR | Status: DC
  Administered 2020-08-19 – 2020-08-22 (×4): 1 mg via INTRAVENOUS
  Filled 2020-08-19 (×5): qty 0.2

## 2020-08-19 MED ORDER — SODIUM CHLORIDE 0.9 % IV SOLN: 1.0000 mg | Freq: Every day | INTRAVENOUS | Status: DC

## 2020-08-19 MED ORDER — CALCIUM GLUCONATE-NACL 2-0.675 GM/100ML-% IV SOLN
2.0000 g | Freq: Once | INTRAVENOUS | Status: AC
  Administered 2020-08-19: 2000 mg via INTRAVENOUS
  Filled 2020-08-19: qty 100

## 2020-08-19 MED ORDER — PANTOPRAZOLE SODIUM 40 MG IV SOLR
40.0000 mg | Freq: Two times a day (BID) | INTRAVENOUS | Status: DC
  Administered 2020-08-19 – 2020-08-25 (×13): 40 mg via INTRAVENOUS
  Filled 2020-08-19 (×13): qty 40

## 2020-08-19 MED ORDER — OXYCODONE HCL 5 MG PO TABS
5.0000 mg | ORAL_TABLET | ORAL | Status: DC | PRN
  Administered 2020-08-19 – 2020-08-26 (×24): 5 mg via ORAL
  Filled 2020-08-19 (×25): qty 1

## 2020-08-19 MED ORDER — HYDRALAZINE HCL 20 MG/ML IJ SOLN
10.0000 mg | INTRAMUSCULAR | Status: DC | PRN
  Administered 2020-08-20 – 2020-08-24 (×7): 10 mg via INTRAVENOUS
  Filled 2020-08-19 (×8): qty 1

## 2020-08-19 NOTE — Progress Notes (Signed)
CRITICAL VALUE ALERT  Critical Value:  Calcium 6.4  Date & Time Notied: 08/19/20 0730  Provider Notified: Ardeen Jourdain MD  Orders Received/Actions taken: waiting for orders

## 2020-08-19 NOTE — Assessment & Plan Note (Signed)
-  Currently no suspicion for underlying infection.  See pancreatitis

## 2020-08-19 NOTE — Assessment & Plan Note (Signed)
-  No home meds noted.  Mood is normal at this time

## 2020-08-19 NOTE — Assessment & Plan Note (Addendum)
-  Patient endorses drinking approximately 2 to 3 cups (solo cups) of vodka with a mixture nightly for approximately the past year since the death of her mom.  She has been drinking 1 to 2 cups prior to that.   -No obvious abnormalities to suggest obstruction on CT abdomen/pelvis and no obstructive pattern appreciated on LFTs -Clinical exam is consistent with pancreatitis, lipase elevated, and CT findings are also radiographically consistent with pancreatitis - today having more decreased bowel sounds (no worsening N/V and pain is actually improving); abdominal xray obtained shows possible ileus vs pSBO - will keep NPO, place NGT, and surgery consulted (patient did not tolerated NGT and removed it). Okay to leave out for now unless develops vomiting and/or serial xrays show worsening distension of colon - continue IVF, pain control, nausea control  - lipase has almost normalized; no further trending needed - repeat CT abd/pelvis on 9/6 given low grade fevers and complication of ileus with pancreatitis: no obvious pancreatic necrosis, stable pancreatitis

## 2020-08-19 NOTE — Assessment & Plan Note (Signed)
-  Offered nicotine patch if needed

## 2020-08-19 NOTE — Progress Notes (Signed)
CRITICAL VALUE ALERT  Critical Value:  Calcium 6.3   Date & Time Notied: 08/19/20 0945  Provider Notified: Girguis MD  Orders Received/Actions taken: IV calcium infusing now

## 2020-08-19 NOTE — Assessment & Plan Note (Signed)
-  Asymptomatic.  Avoid prolonging agents as able

## 2020-08-19 NOTE — Progress Notes (Signed)
Patient with MEWS score of 2 due to HR. Patient's HR has been elevated since admission to ED and continues to be elevated. In no obvious distress and will continue to monitor for further changes.

## 2020-08-19 NOTE — Hospital Course (Addendum)
Robin Guerrero is a 34 year old African-American female with PMH hypertension, anemia who presented to the ER with lower abdominal pain mostly in her left lower abdomen radiating to her left lower back.  She endorsed an increased level of alcohol use over the past year drinking 2-3 solo cups of mixed vodka and drinking approximately 1-2 drinks historically prior to that. On work-up she was found to have acute alcoholic pancreatitis.  Lipase was significantly elevated (almost 4000), with CT findings consistent with pancreatitis.  She was admitted for further work-up and treatment regarding pancreatitis. Lipase did downtrend and her abdominal pain improved after admission. However, on 08/20/20 due to decreased bowel sounds an abdominal xray was obtained and revealed possible ileus vs pSBO. She was kept NPO, an NGT was ordered and surgery consult was placed. Ultimately she did not tolerate the NGT and it was removed per patient almost immediately.

## 2020-08-19 NOTE — Progress Notes (Signed)
PROGRESS NOTE    Robin Guerrero   YFV:[id number]  DOB: [birth year]  DOA: 08/18/2020     0  PCP: Trey Sailors, PA  CC: abdominal pain  Hospital Course: Robin Guerrero is a 34 year old African-American female with PMH hypertension, anemia who presented to the ER with lower abdominal pain mostly in her left lower abdomen radiating to her left lower back.  She endorsed an increased level of alcohol use over the past year drinking 2-3 solo cups of mixed vodka and drinking approximately 1-2 drinks historically prior to that. On work-up she was found to have acute alcoholic pancreatitis.  Lipase was significantly elevated (almost 4000), with CT findings consistent with pancreatitis.  She was admitted for further work-up and treatment regarding pancreatitis.   Interval History:  Seen this morning in her room, she was moderately uncomfortable trying to find a good position in bed.  Continued to complain of fullness, pain, and burning in her left lower abdomen that was also radiating to her back.  Denies any nausea nor vomiting at this time but states that she did vomit yesterday when she drank Sprite very quickly.  Her mouth is very dry and she is still requesting to at least be able to sip slowly on Sprite still.  Informed her this would be okay but to not push herself to the point of nausea nor increased pain again.  Also discussed possible repeat CT tomorrow, she understands the overall plan. Also discussed her case on the phone with her brother while in her room this morning.  She states that he is a Restaurant manager, fast food.  Old records reviewed in assessment of this patient  ROS: Constitutional: positive for fatigue and malaise, Respiratory: negative for cough, Cardiovascular: negative for chest pain and Gastrointestinal: positive for abdominal pain  Assessment & Plan: Acute alcoholic pancreatitis -Patient endorses drinking approximately 2 to 3 cups (solo cups) of vodka with a mixture nightly now  for approximately the past year since the death of her mom.  She has been drinking 1 to 2 cups prior to that.  She seems to be unconvinced that her alcohol consumption has contributed to her first episode now of pancreatitis.  We reviewed multiple other causes/etiologies, all of which have been ruled out at this point.  Also discussed with her brother on the phone who is a Restaurant manager, fast food in [place] and understands diagnosis and treatment plan. -No obvious abnormalities to suggest obstruction on CT abdomen/pelvis and no obstructive pattern appreciated on LFTs -Clinical exam is consistent with pancreatitis, lipase elevated, and CT findings are also radiographically consistent with pancreatitis which satisfies all criteria for diagnosis -Keep n.p.o. (okay for her to have small sips of Sprite and some ice chips with meds) -Continue fluids -Continue pain control -Avoid Zofran given prolonged QTC (663).  Currently not nauseous but if becomes, will order Compazine -Trend lipase -Patient ranks pain currently 6/10.  On admission was 10/10.  Will reassess tomorrow at 48-hour mark, if still having significant pain will consider repeat CT abdomen/pelvis to rule out pancreatic necrosis  SIRS (systemic inflammatory response syndrome) (HCC) -Currently no suspicion for underlying infection.  See pancreatitis  Thrombocytopenia ([place]) -Considered due to underlying chronic alcohol use.  May also be some hemodilution from fluids -4 T score one-point, very low probability of HIT -Okay to continue Lovenox for now.  Will continue to monitor platelet count  Normocytic anemia -Also considered due to underlying alcohol use -Ferritin 126, iron stores adequate - Vitamin B12 588 -Folate low, 5.2.  Continue supplementation and MVI  Prolonged QT interval -Asymptomatic.  Avoid prolonging agents as able  Depression -No home meds noted.  Mood is normal at this time  Essential hypertension -She is unable to tolerate oral  medications -Use labetalol or hydralazine as needed  Hypocalcemia -Replete and recheck as needed  Hypokalemia -Replete and recheck as needed  Hypomagnesemia -Replete and recheck as needed  Tobacco use -Offered nicotine patch if needed   Antimicrobials: None  DVT prophylaxis: Lovenox Code Status: Full Family Communication: Brother via phone Disposition Plan: Status is: Inpatient  Remains inpatient appropriate because:Persistent severe electrolyte disturbances, Ongoing active pain requiring inpatient pain management, Unsafe d/c plan, IV treatments appropriate due to intensity of illness or inability to take PO and Inpatient level of care appropriate due to severity of illness   Dispo: The patient is from: Home              Anticipated d/c is to: Home              Anticipated d/c date is: > 3 days              Patient currently is not medically stable to d/c.       Objective: Blood pressure (!) 149/113, pulse (!) 126, temperature 99.3 F (37.4 C), resp. rate (!) 22, height 5\' 8"  (1.727 m), weight 113.4 kg, SpO2 95 %.  Examination: General appearance: Young adult woman laying in bed appearing uncomfortable but no obvious distress Head: Normocephalic, without obvious abnormality, atraumatic Eyes: EOMI, mild scleral icterus appreciated Lungs: clear to auscultation bilaterally Heart: regular rate and rhythm and S1, S2 normal Abdomen: Obese, soft, mildly distended, moderate to severe tenderness to light palpation in left quadrants with mild guarding but no rebound.  Bowel sounds present Extremities: Trace lower extremity edema Skin: mobility and turgor normal Neurologic: Grossly normal  Consultants:   None  Procedures:   None  Data Reviewed: I have personally reviewed following labs and imaging studies Results for orders placed or performed during the hospital encounter of 08/18/20 (from the past 24 hour(s))  SARS Coronavirus 2 by RT PCR (hospital order,  performed in [facility] hospital lab) Nasopharyngeal Nasopharyngeal Swab     Status: None   Collection Time: 08/18/20  3:37 PM   Specimen: Nasopharyngeal Swab  Result Value Ref Range   SARS Coronavirus 2 NEGATIVE NEGATIVE  Comprehensive metabolic panel     Status: Abnormal   Collection Time: 08/19/20  6:09 AM  Result Value Ref Range   Sodium 136 135 - 145 mmol/L   Potassium 3.2 (L) 3.5 - 5.1 mmol/L   Chloride 102 98 - 111 mmol/L   CO2 21 (L) 22 - 32 mmol/L   Glucose, Bld 106 (H) 70 - 99 mg/dL   BUN 12 6 - 20 mg/dL   Creatinine, Ser 1.13 (H) 0.44 - 1.00 mg/dL   Calcium 6.4 (LL) 8.9 - 10.3 mg/dL   Total Protein 6.6 6.5 - 8.1 g/dL   Albumin 3.0 (L) 3.5 - 5.0 g/dL   AST 70 (H) 15 - 41 U/L   ALT 31 0 - 44 U/L   Alkaline Phosphatase 49 38 - 126 U/L   Total Bilirubin 3.9 (H) 0.3 - 1.2 mg/dL   GFR calc non Af Amer >60 >60 mL/min   GFR calc Af Amer >60 >60 mL/min   Anion gap 13 5 - 15  CBC     Status: Abnormal   Collection Time: 08/19/20  6:09 AM  Result  Value Ref Range   WBC 18.0 (H) 4.0 - 10.5 K/uL   RBC 4.30 3.87 - 5.11 MIL/uL   Hemoglobin 10.9 (L) 12.0 - 15.0 g/dL   HCT 36.4 36 - 46 %   MCV 84.7 80.0 - 100.0 fL   MCH 25.3 (L) 26.0 - 34.0 pg   MCHC 29.9 (L) 30.0 - 36.0 g/dL   RDW 17.9 (H) 11.5 - 15.5 %   Platelets 129 (L) 150 - 400 K/uL   nRBC 0.1 0.0 - 0.2 %  Magnesium     Status: None   Collection Time: 08/19/20  6:09 AM  Result Value Ref Range   Magnesium 2.3 1.7 - 2.4 mg/dL  Lipase, blood     Status: Abnormal   Collection Time: 08/19/20  6:09 AM  Result Value Ref Range   Lipase 1,178 (H) 11 - 51 U/L  Comprehensive metabolic panel     Status: Abnormal   Collection Time: 08/19/20  8:26 AM  Result Value Ref Range   Sodium 134 (L) 135 - 145 mmol/L   Potassium 3.1 (L) 3.5 - 5.1 mmol/L   Chloride 100 98 - 111 mmol/L   CO2 22 22 - 32 mmol/L   Glucose, Bld 98 70 - 99 mg/dL   BUN 12 6 - 20 mg/dL   Creatinine, Ser 1.07 (H) 0.44 - 1.00 mg/dL   Calcium 6.3 (LL) 8.9 - 10.3  mg/dL   Total Protein 6.2 (L) 6.5 - 8.1 g/dL   Albumin 2.9 (L) 3.5 - 5.0 g/dL   AST 63 (H) 15 - 41 U/L   ALT 30 0 - 44 U/L   Alkaline Phosphatase 46 38 - 126 U/L   Total Bilirubin 3.9 (H) 0.3 - 1.2 mg/dL   GFR calc non Af Amer >60 >60 mL/min   GFR calc Af Amer >60 >60 mL/min   Anion gap 12 5 - 15  Folate, serum, performed at [facility] lab     Status: Abnormal   Collection Time: 08/19/20  8:26 AM  Result Value Ref Range   Folate 5.2 (L) >5.9 ng/mL  Vitamin B12     Status: None   Collection Time: 08/19/20  8:26 AM  Result Value Ref Range   Vitamin B-12 588 180 - 914 pg/mL  Iron and TIBC     Status: None   Collection Time: 08/19/20  8:26 AM  Result Value Ref Range   Iron 112 28 - 170 ug/dL   TIBC 415 250 - 450 ug/dL   Saturation Ratios 27 10.4 - 31.8 %   UIBC 303 ug/dL  Ferritin     Status: None   Collection Time: 08/19/20  8:26 AM  Result Value Ref Range   Ferritin 126 11 - 307 ng/mL    Recent Results (from the past 240 hour(s))  SARS Coronavirus 2 by RT PCR (hospital order, performed in [facility] hospital lab) Nasopharyngeal Nasopharyngeal Swab     Status: None   Collection Time: 08/18/20  3:37 PM   Specimen: Nasopharyngeal Swab  Result Value Ref Range Status   SARS Coronavirus 2 NEGATIVE NEGATIVE Final    Comment: (NOTE) SARS-CoV-2 target nucleic acids are NOT DETECTED.  The SARS-CoV-2 RNA is generally detectable in upper and lower respiratory specimens during the acute phase of infection. The lowest concentration of SARS-CoV-2 viral copies this assay can detect is 250 copies / mL. A negative result does not preclude SARS-CoV-2 infection and should not be used as the sole basis for treatment or  other patient management decisions.  A negative result may occur with improper specimen collection / handling, submission of specimen other than nasopharyngeal swab, presence of viral mutation(s) within the areas targeted by this assay, and inadequate number of viral  copies (<250 copies / mL). A negative result must be combined with clinical observations, patient history, and epidemiological information.  Fact Sheet for Patients:   StrictlyIdeas.no  Fact Sheet for Healthcare Providers: BankingDealers.co.za  This test is not yet approved or  cleared by the [place] FDA and has been authorized for detection and/or diagnosis of SARS-CoV-2 by FDA under an Emergency Use Authorization (EUA).  This EUA will remain in effect (meaning this test can be used) for the duration of the COVID-19 declaration under Section 564(b)(1) of the Act, 21 U.S.C. section 360bbb-3(b)(1), unless the authorization is terminated or revoked sooner.  Performed at [facility], [place] [address], [place], [place] [address]      Radiology Studies: CT ABDOMEN PELVIS W CONTRAST  Result Date: 08/18/2020 CLINICAL DATA:  Epigastric abdominal pain. Nausea and vomiting. Elevated white blood cell count. Hypertension and anemia. EXAM: CT ABDOMEN AND PELVIS WITH CONTRAST TECHNIQUE: Multidetector CT imaging of the abdomen and pelvis was performed using the standard protocol following bolus administration of intravenous contrast. CONTRAST:  143mL OMNIPAQUE IOHEXOL 300 MG/ML  SOLN COMPARISON:  None. FINDINGS: Lower chest: Motion degradation throughout. Mild centrilobular emphysema. Mild cardiomegaly, without pericardial or pleural effusion. Hepatobiliary: Marked hepatic steatosis. Normal gallbladder and biliary tract. Pancreas: Pancreatic and peripancreatic edema are moderate. No pancreatic duct dilatation. Fluid throughout the anterior pararenal space. No well-defined fluid collection. No evidence of pancreatic necrosis. Spleen: Normal in size, without focal abnormality. Adrenals/Urinary Tract: Normal adrenal glands. Normal kidneys, without hydronephrosis. Normal urinary bladder. Stomach/Bowel: Normal stomach, without wall  thickening. Normal colon and terminal ileum. Normal small bowel. Vascular/Lymphatic: Normal caliber of the aorta and branch vessels. Patent splenic, portal, hepatic veins. No abdominopelvic adenopathy. Reproductive: Exophytic posterior uterine fibroid of 3.7 cm on 62/3. Other: Small volume intraperitoneal fluid, including within the pelvic cul-de-sac. Musculoskeletal: No acute osseous abnormality. IMPRESSION: 1. Moderate non complicated pancreatitis. Secondary small volume ascites. 2. Hepatic steatosis. 3. Uterine fibroid. 4. Motion degradation. 5. Emphysema (ICD10-J43.9). Electronically Signed   By: Abigail Miyamoto M.D.   On: 08/18/2020 08:52   CT ABDOMEN PELVIS W CONTRAST  Final Result      Scheduled Meds: . enoxaparin (LOVENOX) injection  40 mg Subcutaneous Q24H  . folic acid  1 mg Intravenous Daily  . multivitamin with minerals  1 tablet Oral Daily  . nicotine  14 mg Transdermal Daily  . pantoprazole (PROTONIX) IV  40 mg Intravenous Q12H  . thiamine  100 mg Oral Daily   Or  . thiamine  100 mg Intravenous Daily   PRN Meds: acetaminophen **OR** acetaminophen, hydrALAZINE, labetalol, LORazepam **OR** LORazepam, morphine injection, oxyCODONE Continuous Infusions: . sodium chloride 125 mL/hr at 08/19/20 1240  . potassium chloride 10 mEq (08/19/20 1350)      LOS: 0 days  Time spent: Greater than 50% of the 35 minute visit was spent in counseling/coordination of care for the patient as laid out in the A&P.   Dwyane Dee, MD [facility] 08/19/2020, 2:56 PM  Contact via secure chat.  To contact the attending provider between 7A-7P or the covering provider during after hours 7P-7A, please log into the web site [email or web address] and access using universal [place] password for that web site. If you do not have the password,  please call the hospital operator.

## 2020-08-19 NOTE — Assessment & Plan Note (Signed)
-  Replete and recheck as needed 

## 2020-08-19 NOTE — Assessment & Plan Note (Signed)
-  Also considered due to underlying alcohol use -Ferritin 126, iron stores adequate - Vitamin B12 588 -Folate low, 5.2.  Continue supplementation and MVI

## 2020-08-19 NOTE — Progress Notes (Signed)
Patient arrived to the unit at approximately. She was c/o nausea and abdominal pain. No further complaints voiced. Pateitn was made comfortable and will get a prn order for nausea

## 2020-08-19 NOTE — Assessment & Plan Note (Signed)
-  She is unable to tolerate oral medications -Use labetalol or hydralazine as needed

## 2020-08-19 NOTE — Assessment & Plan Note (Addendum)
-  Considered due to underlying chronic alcohol use and acute pancreatitis.  May also be some hemodilution from fluids -4 T score one-point, very low probability of HIT - given further downtrend, d/c Lovenox on 9/6 and keep monitoring; no s/s bleeding at this time - if further drop <50, may need to consider more workup and/or hematology consult

## 2020-08-20 ENCOUNTER — Inpatient Hospital Stay (HOSPITAL_COMMUNITY): Payer: Medicaid Other

## 2020-08-20 LAB — BASIC METABOLIC PANEL
Anion gap: 10 (ref 5–15)
Anion gap: 14 (ref 5–15)
BUN: 10 mg/dL (ref 6–20)
BUN: 9 mg/dL (ref 6–20)
CO2: 21 mmol/L — ABNORMAL LOW (ref 22–32)
CO2: 21 mmol/L — ABNORMAL LOW (ref 22–32)
Calcium: 5.9 mg/dL — CL (ref 8.9–10.3)
Calcium: 6.9 mg/dL — ABNORMAL LOW (ref 8.9–10.3)
Chloride: 104 mmol/L (ref 98–111)
Chloride: 104 mmol/L (ref 98–111)
Creatinine, Ser: 0.85 mg/dL (ref 0.44–1.00)
Creatinine, Ser: 0.79 mg/dL (ref 0.44–1.00)
GFR calc Af Amer: >60 mL/min (ref >60)
GFR calc Af Amer: >60 mL/min (ref >60)
GFR calc non Af Amer: >60 mL/min (ref >60)
GFR calc non Af Amer: >60 mL/min (ref >60)
Glucose, Bld: 127 mg/dL — ABNORMAL HIGH (ref 70–99)
Glucose, Bld: 125 mg/dL — ABNORMAL HIGH (ref 70–99)
Potassium: 2.9 mmol/L — ABNORMAL LOW (ref 3.5–5.1)
Potassium: 2.9 mmol/L — ABNORMAL LOW (ref 3.5–5.1)
Sodium: 135 mmol/L (ref 135–145)
Sodium: 139 mmol/L (ref 135–145)

## 2020-08-20 LAB — COMPREHENSIVE METABOLIC PANEL
ALT: 26 U/L (ref 0–44)
AST: 63 U/L — ABNORMAL HIGH (ref 15–41)
Albumin: 2.7 g/dL — ABNORMAL LOW (ref 3.5–5.0)
Alkaline Phosphatase: 45 U/L (ref 38–126)
Anion gap: 10 (ref 5–15)
BUN: 10 mg/dL (ref 6–20)
CO2: 22 mmol/L (ref 22–32)
Calcium: 5.8 mg/dL — CL (ref 8.9–10.3)
Chloride: 103 mmol/L (ref 98–111)
Creatinine, Ser: 0.79 mg/dL (ref 0.44–1.00)
GFR calc Af Amer: >60 mL/min (ref >60)
GFR calc non Af Amer: >60 mL/min (ref >60)
Glucose, Bld: 101 mg/dL — ABNORMAL HIGH (ref 70–99)
Potassium: 2.8 mmol/L — ABNORMAL LOW (ref 3.5–5.1)
Sodium: 135 mmol/L (ref 135–145)
Total Bilirubin: 4 mg/dL — ABNORMAL HIGH (ref 0.3–1.2)
Total Protein: 5.9 g/dL — ABNORMAL LOW (ref 6.5–8.1)

## 2020-08-20 LAB — CBC WITH DIFFERENTIAL/PLATELET
Abs Immature Granulocytes: 0.18 10*3/uL — ABNORMAL HIGH (ref 0.00–0.07)
Basophils Absolute: 0 10*3/uL (ref 0.0–0.1)
Basophils Relative: 0 %
Eosinophils Absolute: 0.2 10*3/uL (ref 0.0–0.5)
Eosinophils Relative: 2 %
HCT: 30.2 % — ABNORMAL LOW (ref 36.0–46.0)
Hemoglobin: 9.4 g/dL — ABNORMAL LOW (ref 12.0–15.0)
Immature Granulocytes: 1 %
Lymphocytes Relative: 9 %
Lymphs Abs: 1.3 10*3/uL (ref 0.7–4.0)
MCH: 25.9 pg — ABNORMAL LOW (ref 26.0–34.0)
MCHC: 31.1 g/dL (ref 30.0–36.0)
MCV: 83.2 fL (ref 80.0–100.0)
Monocytes Absolute: 1 10*3/uL (ref 0.1–1.0)
Monocytes Relative: 7 %
Neutro Abs: 12.3 10*3/uL — ABNORMAL HIGH (ref 1.7–7.7)
Neutrophils Relative %: 81 %
Platelets: 70 10*3/uL — ABNORMAL LOW (ref 150–400)
RBC: 3.63 MIL/uL — ABNORMAL LOW (ref 3.87–5.11)
RDW: 18.5 % — ABNORMAL HIGH (ref 11.5–15.5)
WBC: 15 10*3/uL — ABNORMAL HIGH (ref 4.0–10.5)
nRBC: 0.2 % (ref 0.0–0.2)

## 2020-08-20 LAB — LIPASE, BLOOD: Lipase: 219 U/L — ABNORMAL HIGH (ref 11–51)

## 2020-08-20 LAB — MAGNESIUM: Magnesium: 2.2 mg/dL (ref 1.7–2.4)

## 2020-08-20 LAB — PHOSPHORUS
Phosphorus: <1 mg/dL — CL (ref 2.5–4.6)
Phosphorus: <1 mg/dL — CL (ref 2.5–4.6)
Phosphorus: 1.4 mg/dL — ABNORMAL LOW (ref 2.5–4.6)

## 2020-08-20 MED ORDER — CALCIUM GLUCONATE-NACL 2-0.675 GM/100ML-% IV SOLN
2.0000 g | Freq: Once | INTRAVENOUS | Status: AC
  Administered 2020-08-20: 2000 mg via INTRAVENOUS
  Filled 2020-08-20: qty 100

## 2020-08-20 MED ORDER — POTASSIUM PHOSPHATES 15 MMOLE/5ML IV SOLN
30.0000 mmol | Freq: Once | INTRAVENOUS | Status: AC
  Administered 2020-08-21: 30 mmol via INTRAVENOUS
  Filled 2020-08-20: qty 10

## 2020-08-20 MED ORDER — POTASSIUM PHOSPHATES 15 MMOLE/5ML IV SOLN
30.0000 mmol | Freq: Once | INTRAVENOUS | Status: DC
  Filled 2020-08-20: qty 10

## 2020-08-20 MED ORDER — POTASSIUM CHLORIDE 10 MEQ/100ML IV SOLN
10.0000 meq | INTRAVENOUS | Status: DC
  Filled 2020-08-20: qty 100

## 2020-08-20 MED ORDER — POTASSIUM PHOSPHATES 15 MMOLE/5ML IV SOLN: 30.0000 mmol | Freq: Once | INTRAVENOUS | Status: DC

## 2020-08-20 MED ORDER — POTASSIUM CHLORIDE 20 MEQ/15ML (10%) PO SOLN
40.0000 meq | ORAL | Status: AC
  Administered 2020-08-20 – 2020-08-21 (×2): 40 meq via ORAL
  Filled 2020-08-20 (×2): qty 30

## 2020-08-20 MED ORDER — BISACODYL 10 MG RE SUPP
10.0000 mg | Freq: Every day | RECTAL | Status: DC
  Administered 2020-08-20: 10 mg via RECTAL
  Filled 2020-08-20: qty 1

## 2020-08-20 MED ORDER — POTASSIUM & SODIUM PHOSPHATES 280-160-250 MG PO PACK
1.0000 | PACK | Freq: Three times a day (TID) | ORAL | Status: DC
  Administered 2020-08-20 – 2020-08-21 (×2): 1 via ORAL
  Filled 2020-08-20 (×5): qty 1

## 2020-08-20 MED ORDER — CALCIUM CARBONATE ANTACID 500 MG PO CHEW
200.0000 mg | CHEWABLE_TABLET | Freq: Three times a day (TID) | ORAL | Status: DC | PRN
  Filled 2020-08-20: qty 1

## 2020-08-20 MED ORDER — POTASSIUM PHOSPHATES 15 MMOLE/5ML IV SOLN
30.0000 mmol | Freq: Once | INTRAVENOUS | Status: AC
  Administered 2020-08-20: 30 mmol via INTRAVENOUS
  Filled 2020-08-20: qty 10

## 2020-08-20 NOTE — H&P (Signed)
Reason for Consult/Chief Complaint: ileus vs SBO Consultant: Sabino Gasser, MD  Robin Guerrero is an 34 y.o. female.   HPI: 65F presented with abdominal pain, n/v two days ago and was diagnosed with pancreatitis, being managed conservatively. She reports new onset of bloating/distention and gassy feeling starting 9/4, that was worse and different than her presenting pain, which prompted the primary team to obtain an abdominal XR revealing some mildly dilated small bowel loops.She reports one bowel movement this AM and some minimal passage of flatus, but describes obstipation. She denies any prior episodes of pancreatitis.   Past Medical History:  Diagnosis Date  . History of anemia    no current med.  . Hypertension    states under control with med., has been on med. x 3 yr.    Past Surgical History:  Procedure Laterality Date  . FOREIGN BODY REMOVAL Left 02/10/2015   Procedure: ATTEMPTED EXPLANTATION OF BIRTH CONTROL DEVICE LEFT ARM;  Surgeon: Johnathan Hausen, MD;  Location: [facility];  Service: General;  Laterality: Left;  . FOREIGN BODY REMOVAL Left 05/12/2015   Procedure: REMOVAL OF LEFT ARM BIRTH CONTROL DEVICE;  Surgeon: Johnathan Hausen, MD;  Location: [facility];  Service: General;  Laterality: Left;  . SUBMANDIBULAR GLAND EXCISION Right 05/01/2009    Family History  Problem Relation Age of Onset  . Breast cancer Mother 21    Social History:  reports that she has been smoking cigarettes. She has smoked for the past 5.00 years. She has never used smokeless tobacco. She reports current alcohol use. She reports that she does not use drugs.  Allergies: No Known Allergies  Medications: I have reviewed the patient's current medications.  Results for orders placed or performed during the hospital encounter of 08/18/20 (from the past 48 hour(s))  SARS Coronavirus 2 by RT PCR (hospital order, performed in [facility] hospital lab) Nasopharyngeal  Nasopharyngeal Swab     Status: None   Collection Time: 08/18/20  3:37 PM   Specimen: Nasopharyngeal Swab  Result Value Ref Range   SARS Coronavirus 2 NEGATIVE NEGATIVE    Comment: (NOTE) SARS-CoV-2 target nucleic acids are NOT DETECTED.  The SARS-CoV-2 RNA is generally detectable in upper and lower respiratory specimens during the acute phase of infection. The lowest concentration of SARS-CoV-2 viral copies this assay can detect is 250 copies / mL. A negative result does not preclude SARS-CoV-2 infection and should not be used as the sole basis for treatment or other patient management decisions.  A negative result may occur with improper specimen collection / handling, submission of specimen other than nasopharyngeal swab, presence of viral mutation(s) within the areas targeted by this assay, and inadequate number of viral copies (<250 copies / mL). A negative result must be combined with clinical observations, patient history, and epidemiological information.  Fact Sheet for Patients:   StrictlyIdeas.no  Fact Sheet for Healthcare Providers: BankingDealers.co.za  This test is not yet approved or  cleared by the [place] FDA and has been authorized for detection and/or diagnosis of SARS-CoV-2 by FDA under an Emergency Use Authorization (EUA).  This EUA will remain in effect (meaning this test can be used) for the duration of the COVID-19 declaration under Section 564(b)(1) of the Act, 21 U.S.C. section 360bbb-3(b)(1), unless the authorization is terminated or revoked sooner.  Performed at [facility], [place] [address] [place], [place] [address]   Comprehensive metabolic panel     Status: Abnormal   Collection  Time: 08/19/20  6:09 AM  Result Value Ref Range   Sodium 136 135 - 145 mmol/L   Potassium 3.2 (L) 3.5 - 5.1 mmol/L   Chloride 102 98 - 111 mmol/L   CO2 21 (L) 22 - 32 mmol/L   Glucose, Bld 106 (H)  70 - 99 mg/dL    Comment: Glucose reference range applies only to samples taken after fasting for at least 8 hours.   BUN 12 6 - 20 mg/dL   Creatinine, Ser 1.13 (H) 0.44 - 1.00 mg/dL   Calcium 6.4 (LL) 8.9 - 10.3 mg/dL    Comment: CRITICAL RESULT CALLED TO, READ BACK BY AND VERIFIED WITH: LIPTKA,P. RN AT 3664 08/19/20 MULLINS,T    Total Protein 6.6 6.5 - 8.1 g/dL   Albumin 3.0 (L) 3.5 - 5.0 g/dL   AST 70 (H) 15 - 41 U/L   ALT 31 0 - 44 U/L   Alkaline Phosphatase 49 38 - 126 U/L   Total Bilirubin 3.9 (H) 0.3 - 1.2 mg/dL   GFR calc non Af Amer >60 >60 mL/min   GFR calc Af Amer >60 >60 mL/min   Anion gap 13 5 - 15    Comment: Performed at [facility], [place] [address] [place], [place] [address]  CBC     Status: Abnormal   Collection Time: 08/19/20  6:09 AM  Result Value Ref Range   WBC 18.0 (H) 4.0 - 10.5 K/uL   RBC 4.30 3.87 - 5.11 MIL/uL   Hemoglobin 10.9 (L) 12.0 - 15.0 g/dL   HCT 36.4 36 - 46 %   MCV 84.7 80.0 - 100.0 fL   MCH 25.3 (L) 26.0 - 34.0 pg   MCHC 29.9 (L) 30.0 - 36.0 g/dL   RDW 17.9 (H) 11.5 - 15.5 %   Platelets 129 (L) 150 - 400 K/uL    Comment: REPEATED TO VERIFY CONSISTENT WITH PREVIOUS RESULT    nRBC 0.1 0.0 - 0.2 %    Comment: Performed at [facility], [place] [address], [place], [place] [address]  Magnesium     Status: None   Collection Time: 08/19/20  6:09 AM  Result Value Ref Range   Magnesium 2.3 1.7 - 2.4 mg/dL    Comment: Performed at [facility], [place] [address], [place], [place] [address]  Lipase, blood     Status: Abnormal   Collection Time: 08/19/20  6:09 AM  Result Value Ref Range   Lipase 1,178 (H) 11 - 51 U/L    Comment: RESULTS CONFIRMED BY MANUAL DILUTION Performed at [facility], [place] [address] [place], [place] [address]   Comprehensive metabolic panel     Status: Abnormal   Collection Time: 08/19/20  8:26 AM  Result Value Ref Range   Sodium 134 (L) 135 - 145  mmol/L   Potassium 3.1 (L) 3.5 - 5.1 mmol/L   Chloride 100 98 - 111 mmol/L   CO2 22 22 - 32 mmol/L   Glucose, Bld 98 70 - 99 mg/dL    Comment: Glucose reference range applies only to samples taken after fasting for at least 8 hours.   BUN 12 6 - 20 mg/dL   Creatinine, Ser 1.07 (H) 0.44 - 1.00 mg/dL   Calcium 6.3 (LL) 8.9 - 10.3 mg/dL    Comment: CRITICAL RESULT CALLED TO, READ BACK BY AND VERIFIED WITH: LIPTKA,P. RN AT 0945 05/19/20 MULLINS,T    Total Protein 6.2 (L) 6.5 - 8.1 g/dL  Albumin 2.9 (L) 3.5 - 5.0 g/dL   AST 63 (H) 15 - 41 U/L   ALT 30 0 - 44 U/L   Alkaline Phosphatase 46 38 - 126 U/L   Total Bilirubin 3.9 (H) 0.3 - 1.2 mg/dL   GFR calc non Af Amer >60 >60 mL/min   GFR calc Af Amer >60 >60 mL/min   Anion gap 12 5 - 15    Comment: Performed at [facility], [place] [address], [place], [place] [address]  Folate, serum, performed at [facility] lab     Status: Abnormal   Collection Time: 08/19/20  8:26 AM  Result Value Ref Range   Folate 5.2 (L) >5.9 ng/mL    Comment: Performed at [facility], [place] [address], [place], [place] [address]  Vitamin B12     Status: None   Collection Time: 08/19/20  8:26 AM  Result Value Ref Range   Vitamin B-12 588 180 - 914 pg/mL    Comment: (NOTE) This assay is not validated for testing neonatal or myeloproliferative syndrome specimens for Vitamin B12 levels. Performed at [facility], [place] [address] [place], [place] [address]   Iron and TIBC     Status: None   Collection Time: 08/19/20  8:26 AM  Result Value Ref Range   Iron 112 28 - 170 ug/dL   TIBC 415 250 - 450 ug/dL   Saturation Ratios 27 10.4 - 31.8 %   UIBC 303 ug/dL    Comment: Performed at [facility], [place] [address], [place], [place] [address]  Ferritin     Status: None   Collection Time: 08/19/20  8:26 AM  Result Value Ref Range   Ferritin 126 11 - 307 ng/mL    Comment: Performed at [facility], [place] [address] [place], [place] [address]  CBC with Differential/Platelet     Status: Abnormal   Collection Time: 08/20/20  5:48 AM  Result Value Ref Range   WBC 15.0 (H) 4.0 - 10.5 K/uL   RBC 3.63 (L) 3.87 - 5.11 MIL/uL   Hemoglobin 9.4 (L) 12.0 - 15.0 g/dL   HCT 30.2 (L) 36 - 46 %   MCV 83.2 80.0 - 100.0 fL   MCH 25.9 (L) 26.0 - 34.0 pg   MCHC 31.1 30.0 - 36.0 g/dL   RDW 18.5 (H) 11.5 - 15.5 %   Platelets 70 (L) 150 - 400 K/uL    Comment: Immature Platelet Fraction may be clinically indicated, consider ordering this additional test [id number]    nRBC 0.2 0.0 - 0.2 %   Neutrophils Relative % 81 %   Neutro Abs 12.3 (H) 1.7 - 7.7 K/uL   Lymphocytes Relative 9 %   Lymphs Abs 1.3 0.7 - 4.0 K/uL   Monocytes Relative 7 %   Monocytes Absolute 1.0 0 - 1 K/uL   Eosinophils Relative 2 %   Eosinophils Absolute 0.2 0 - 0 K/uL   Basophils Relative 0 %   Basophils Absolute 0.0 0 - 0 K/uL   Immature Granulocytes 1 %   Abs Immature Granulocytes 0.18 (H) 0.00 - 0.07 K/uL    Comment: Performed at [facility], [place] [address] [place], [place] [address]  Comprehensive metabolic panel     Status: Abnormal   Collection Time: 08/20/20  5:48 AM  Result Value Ref Range   Sodium 135 135 - 145 mmol/L   Potassium 2.8 (L) 3.5 - 5.1 mmol/L  Chloride 103 98 - 111 mmol/L   CO2 22 22 - 32 mmol/L   Glucose, Bld 101 (H) 70 - 99 mg/dL    Comment: Glucose reference range applies only to samples taken after fasting for at least 8 hours.   BUN 10 6 - 20 mg/dL   Creatinine, Ser 0.79 0.44 - 1.00 mg/dL   Calcium 5.8 (LL) 8.9 - 10.3 mg/dL    Comment: CRITICAL RESULT CALLED TO, READ BACK BY AND VERIFIED WITH: C,MILLAR AT 2458 ON 08/20/20 BY AMOHAMED    Total Protein 5.9 (L) 6.5 - 8.1 g/dL   Albumin 2.7 (L) 3.5 - 5.0 g/dL   AST 63 (H) 15 - 41 U/L   ALT 26 0 - 44 U/L   Alkaline Phosphatase 45 38 - 126 U/L   Total Bilirubin 4.0 (H) 0.3 - 1.2 mg/dL   GFR calc non Af  Amer >60 >60 mL/min   GFR calc Af Amer >60 >60 mL/min   Anion gap 10 5 - 15    Comment: Performed at [facility], [place] [address], [place], [place] [address]  Magnesium     Status: None   Collection Time: 08/20/20  5:48 AM  Result Value Ref Range   Magnesium 2.2 1.7 - 2.4 mg/dL    Comment: Performed at [facility], [place] [address], [place], [place] [address]  Phosphorus     Status: Abnormal   Collection Time: 08/20/20  5:48 AM  Result Value Ref Range   Phosphorus <1.0 (LL) 2.5 - 4.6 mg/dL    Comment: REPEATED TO VERIFY NO VISIBLE HEMOLYSIS CRITICAL RESULT CALLED TO, READ BACK BY AND VERIFIED WITH: Hali Marry RN 1002 08/20/20 JM Performed at [facility], [place] [address], [place], [place] [address]   Lipase, blood     Status: Abnormal   Collection Time: 08/20/20 10:08 AM  Result Value Ref Range   Lipase 219 (H) 11 - 51 U/L    Comment: Performed at [facility], [place] [address], [place], [place] [address]    DG Abd Portable 2V  Result Date: 08/20/2020 CLINICAL DATA:  Lower abdominal pain.   pancreatitis. EXAM: PORTABLE ABDOMEN - 2 VIEW COMPARISON:  CT 08/18/2020 FINDINGS: Upright and supine views. The upright view demonstrates no free intraperitoneal air or significant air-fluid levels. The supine views demonstrate gas-filled bowel loops, favored to be small bowel including at up to 4.5 cm. Distal gas identified. IMPRESSION: Gaseous distension of small bowel loops, favoring adynamic ileus versus low-grade partial small bowel obstruction. No free intraperitoneal air or other acute complication. Electronically Signed   By: Abigail Miyamoto M.D.   On: 08/20/2020 11:52    ROS 10 point review of systems is negative except as listed above in HPI.   Physical Exam Blood pressure (!) 156/116, pulse (!) 117, temperature 99.2 F (37.3 C), temperature source Oral, resp. rate 17, height 5\' 8"  (1.727 m), weight 113.4 kg, SpO2 99  %. Constitutional: well-developed, well-nourished HEENT: pupils equal, round, reactive to light, 69mm b/l, moist conjunctiva, external inspection of ears and nose normal, hearing intact Oropharynx: normal oropharyngeal mucosa, normal dentition Neck: no thyromegaly, trachea midline, no midline cervical tenderness to palpation Chest: breath sounds equal bilaterally, normal respiratory effort, no midline or lateral chest wall tenderness to palpation/deformity Abdomen: soft, mild diffuse tenderness without rebound or guarding, tympanitic, no bruising, no hepatosplenomegaly GU: normal female genitalia  Back: no wounds, no thoracic/lumbar spine tenderness to palpation, no thoracic/lumbar spine stepoffs  Rectal: deferred Extremities: 2+ radial and pedal pulses bilaterally, motor and sensation intact to bilateral UE and LE, no peripheral edema MSK: normal gait/station, no clubbing/cyanosis of fingers/toes, normal ROM of all four extremities Skin: warm, dry, no rashes Psych: normal memory, normal mood/affect    Assessment/Plan: 71F with pancreatitis and probable reactive ileus.   Recommend aggressive correction of electrolytes to goal potassium of 4 (currently 2.8), phosphorus of 3 (currently less than 1), magnesium of 2 (corrected), and corrected calcium of 9 (currently 6.9). Recommend NGT placement for symptom control, may use tube for meds and would recommend electrolyte correction via combined enteral and parenteral routes. Would recommend early initiation of parenteral nutrition in this setting given her hypoalbuminemia, however will defer final decision regarding this to the primary team. Also recommend daily suppositories.    Jesusita Oka, MD General and [facility]

## 2020-08-20 NOTE — Progress Notes (Signed)
Called to verify oral potassium order with Dr. Bobbye Morton and informed her the patient wouldn't keep the NG tube in. She stated to go ahead and give it to her orally.

## 2020-08-20 NOTE — Progress Notes (Signed)
PROGRESS NOTE    Robin Guerrero   TJQ:[id number]  DOB: 1986-02-25  DOA: 08/18/2020     1  PCP: Trey Sailors, PA  CC: abdominal pain  Hospital Course: Robin Guerrero is a 34 year old African-American female with PMH hypertension, anemia who presented to the ER with lower abdominal pain mostly in her left lower abdomen radiating to her left lower back.  She endorsed an increased level of alcohol use over the past year drinking 2-3 solo cups of mixed vodka and drinking approximately 1-2 drinks historically prior to that. On work-up she was found to have acute alcoholic pancreatitis.  Lipase was significantly elevated (almost 4000), with CT findings consistent with pancreatitis.  She was admitted for further work-up and treatment regarding pancreatitis. Lipase did downtrend and her abdominal pain improved after admission. However, on 08/20/20 due to decreased bowel sounds an abdominal xray was obtained and revealed possible ileus vs pSBO. She was kept NPO, an NGT was ordered and surgery consult was placed.    Interval History:  No events overnight. Still endorsing "gas/bloating" in her abdomen. Had a BM and some flatus since yesterday but BM was small she says. Nausea is minimal and no further vomiting and is tolerating sipping on her sprite.   Old records reviewed in assessment of this patient  ROS: Constitutional: positive for fatigue and malaise, Respiratory: negative for cough, Cardiovascular: negative for chest pain and Gastrointestinal: positive for abdominal pain  Assessment & Plan: Acute alcoholic pancreatitis -Patient endorses drinking approximately 2 to 3 cups (solo cups) of vodka with a mixture nightly for approximately the past year since the death of her mom.  She has been drinking 1 to 2 cups prior to that.   -No obvious abnormalities to suggest obstruction on CT abdomen/pelvis and no obstructive pattern appreciated on LFTs -Clinical exam is consistent with pancreatitis,  lipase elevated, and CT findings are also radiographically consistent with pancreatitis - today having more decreased bowel sounds (no worsening N/V and pain is actually improving); abdominal xray obtained shows possible ileus vs pSBO - will keep NPO, place NGT, and surgery consulted - continue IVF, pain control, nausea control  -Trend lipase  SIRS (systemic inflammatory response syndrome) (HCC) -Currently no suspicion for underlying infection.  See pancreatitis  Thrombocytopenia ([place]) -Considered due to underlying chronic alcohol use.  May also be some hemodilution from fluids -4 T score one-point, very low probability of HIT -Okay to continue Lovenox for now.  Will continue to monitor platelet count - PLTC still downtrending but no obvious bleeding; Hgb lower than admission but in June 2021 her Hgb was 8.6 g/dL at that time   Normocytic anemia -Also considered due to underlying alcohol use -Ferritin 126, iron stores adequate - Vitamin B12 588 -Folate low, 5.2.  Continue supplementation and MVI  Prolonged QT interval -Asymptomatic.  Avoid prolonging agents as able  Depression -No home meds noted.  Mood is normal at this time  Essential hypertension -She is unable to tolerate oral medications -Use labetalol or hydralazine as needed  Hypocalcemia -Replete and recheck as needed  Hypokalemia -Replete and recheck as needed  Hypomagnesemia -Replete and recheck as needed  Tobacco use -Offered nicotine patch if needed   Antimicrobials: None  DVT prophylaxis: Lovenox Code Status: Full Family Communication: Brother via phone Disposition Plan: Status is: Inpatient  Remains inpatient appropriate because:Persistent severe electrolyte disturbances, Ongoing active pain requiring inpatient pain management, Unsafe d/c plan, IV treatments appropriate due to intensity of illness or inability to take PO  and Inpatient level of care appropriate due to severity of illness   Dispo:  The patient is from: Home              Anticipated d/c is to: Home              Anticipated d/c date is: > 3 days              Patient currently is not medically stable to d/c.  Objective: Blood pressure (!) 156/116, pulse (!) 117, temperature 99.2 F (37.3 C), temperature source Oral, resp. rate 17, height 5\' 8"  (1.727 m), weight 113.4 kg, SpO2 99 %.  Examination: General appearance: Young adult woman laying in bed appearing uncomfortable but no obvious distress Head: Normocephalic, without obvious abnormality, atraumatic Eyes: EOMI, mild scleral icterus appreciated Lungs: clear to auscultation bilaterally Heart: regular rate and rhythm and S1, S2 normal Abdomen: Obese, soft, mildly distended, moderate to severe tenderness to light palpation in left quadrants with mild guarding but no rebound.  Bowel sounds present Extremities: Trace lower extremity edema Skin: mobility and turgor normal Neurologic: Grossly normal  Consultants:   Surgery  Procedures:   None  Data Reviewed: I have personally reviewed following labs and imaging studies Results for orders placed or performed during the hospital encounter of 08/18/20 (from the past 24 hour(s))  CBC with Differential/Platelet     Status: Abnormal   Collection Time: 08/20/20  5:48 AM  Result Value Ref Range   WBC 15.0 (H) 4.0 - 10.5 K/uL   RBC 3.63 (L) 3.87 - 5.11 MIL/uL   Hemoglobin 9.4 (L) 12.0 - 15.0 g/dL   HCT 30.2 (L) 36 - 46 %   MCV 83.2 80.0 - 100.0 fL   MCH 25.9 (L) 26.0 - 34.0 pg   MCHC 31.1 30.0 - 36.0 g/dL   RDW 18.5 (H) 11.5 - 15.5 %   Platelets 70 (L) 150 - 400 K/uL   nRBC 0.2 0.0 - 0.2 %   Neutrophils Relative % 81 %   Neutro Abs 12.3 (H) 1.7 - 7.7 K/uL   Lymphocytes Relative 9 %   Lymphs Abs 1.3 0.7 - 4.0 K/uL   Monocytes Relative 7 %   Monocytes Absolute 1.0 0 - 1 K/uL   Eosinophils Relative 2 %   Eosinophils Absolute 0.2 0 - 0 K/uL   Basophils Relative 0 %   Basophils Absolute 0.0 0 - 0 K/uL   Immature  Granulocytes 1 %   Abs Immature Granulocytes 0.18 (H) 0.00 - 0.07 K/uL  Comprehensive metabolic panel     Status: Abnormal   Collection Time: 08/20/20  5:48 AM  Result Value Ref Range   Sodium 135 135 - 145 mmol/L   Potassium 2.8 (L) 3.5 - 5.1 mmol/L   Chloride 103 98 - 111 mmol/L   CO2 22 22 - 32 mmol/L   Glucose, Bld 101 (H) 70 - 99 mg/dL   BUN 10 6 - 20 mg/dL   Creatinine, Ser 0.79 0.44 - 1.00 mg/dL   Calcium 5.8 (LL) 8.9 - 10.3 mg/dL   Total Protein 5.9 (L) 6.5 - 8.1 g/dL   Albumin 2.7 (L) 3.5 - 5.0 g/dL   AST 63 (H) 15 - 41 U/L   ALT 26 0 - 44 U/L   Alkaline Phosphatase 45 38 - 126 U/L   Total Bilirubin 4.0 (H) 0.3 - 1.2 mg/dL   GFR calc non Af Amer >60 >60 mL/min   GFR calc Af Amer >60 >60 mL/min  Anion gap 10 5 - 15  Magnesium     Status: None   Collection Time: 08/20/20  5:48 AM  Result Value Ref Range   Magnesium 2.2 1.7 - 2.4 mg/dL  Phosphorus     Status: Abnormal   Collection Time: 08/20/20  5:48 AM  Result Value Ref Range   Phosphorus <1.0 (LL) 2.5 - 4.6 mg/dL  Lipase, blood     Status: Abnormal   Collection Time: 08/20/20 10:08 AM  Result Value Ref Range   Lipase 219 (H) 11 - 51 U/L    Recent Results (from the past 240 hour(s))  SARS Coronavirus 2 by RT PCR (hospital order, performed in [place] hospital lab) Nasopharyngeal Nasopharyngeal Swab     Status: None   Collection Time: 08/18/20  3:37 PM   Specimen: Nasopharyngeal Swab  Result Value Ref Range Status   SARS Coronavirus 2 NEGATIVE NEGATIVE Final    Comment: (NOTE) SARS-CoV-2 target nucleic acids are NOT DETECTED.  The SARS-CoV-2 RNA is generally detectable in upper and lower respiratory specimens during the acute phase of infection. The lowest concentration of SARS-CoV-2 viral copies this assay can detect is 250 copies / mL. A negative result does not preclude SARS-CoV-2 infection and should not be used as the sole basis for treatment or other patient management decisions.  A negative result  may occur with improper specimen collection / handling, submission of specimen other than nasopharyngeal swab, presence of viral mutation(s) within the areas targeted by this assay, and inadequate number of viral copies (<250 copies / mL). A negative result must be combined with clinical observations, patient history, and epidemiological information.  Fact Sheet for Patients:   StrictlyIdeas.no  Fact Sheet for Healthcare Providers: BankingDealers.co.za  This test is not yet approved or  cleared by the [place] FDA and has been authorized for detection and/or diagnosis of SARS-CoV-2 by FDA under an Emergency Use Authorization (EUA).  This EUA will remain in effect (meaning this test can be used) for the duration of the COVID-19 declaration under Section 564(b)(1) of the Act, 21 U.S.C. section 360bbb-3(b)(1), unless the authorization is terminated or revoked sooner.  Performed at [facility], [place] [address] [place], [place] [address]      Radiology Studies: DG Abd Portable 2V  Result Date: 08/20/2020 CLINICAL DATA:  Lower abdominal pain.   pancreatitis. EXAM: PORTABLE ABDOMEN - 2 VIEW COMPARISON:  CT 08/18/2020 FINDINGS: Upright and supine views. The upright view demonstrates no free intraperitoneal air or significant air-fluid levels. The supine views demonstrate gas-filled bowel loops, favored to be small bowel including at up to 4.5 cm. Distal gas identified. IMPRESSION: Gaseous distension of small bowel loops, favoring adynamic ileus versus low-grade partial small bowel obstruction. No free intraperitoneal air or other acute complication. Electronically Signed   By: Abigail Miyamoto M.D.   On: 08/20/2020 11:52   DG Abd Portable 2V  Final Result    CT ABDOMEN PELVIS W CONTRAST  Final Result      Scheduled Meds: . bisacodyl  10 mg Rectal Daily  . enoxaparin (LOVENOX) injection  40 mg Subcutaneous Q24H  .  folic acid  1 mg Intravenous Daily  . multivitamin with minerals  1 tablet Oral Daily  . nicotine  14 mg Transdermal Daily  . pantoprazole (PROTONIX) IV  40 mg Intravenous Q12H  . thiamine  100 mg Oral Daily   Or  . thiamine  100 mg Intravenous Daily   PRN Meds: acetaminophen **OR** acetaminophen,  calcium carbonate, hydrALAZINE, labetalol, morphine injection, oxyCODONE Continuous Infusions: . sodium chloride 125 mL/hr at 08/20/20 0403  . potassium PHOSPHATE IVPB (in mmol)    . potassium PHOSPHATE IVPB (in mmol)        LOS: 1 day  Time spent: Greater than 50% of the 35 minute visit was spent in counseling/coordination of care for the patient as laid out in the A&P.   Dwyane Dee, MD [facility] 08/20/2020, 2:01 PM  Contact via secure chat.  To contact the attending provider between 7A-7P or the covering provider during after hours 7P-7A, please log into the web site [email or web address] and access using universal [place] password for that web site. If you do not have the password, please call the hospital operator.

## 2020-08-20 NOTE — Progress Notes (Signed)
Explained to patient the procedure of inserting a NG tube, then proceeded  To insert the tube into her Left nare. When I reached her stomach, yellow liquid  started to appear and the tubing was hooked up to LIS. Robin Guerrero became very upset stating she couldn't swallow or breath and she started hyperventilating. I tried to explain that she could swallow and breath and to try and slow her breathing down. She was having a panic attack and insisted I take the tube out immediately, which I did. Dr. Sabino Gasser notified of the situation.

## 2020-08-21 ENCOUNTER — Inpatient Hospital Stay (HOSPITAL_COMMUNITY): Payer: Medicaid Other

## 2020-08-21 DIAGNOSIS — K567 Ileus, unspecified: Secondary | ICD-10-CM

## 2020-08-21 LAB — COMPREHENSIVE METABOLIC PANEL
ALT: 22 U/L (ref 0–44)
ALT: 24 U/L (ref 0–44)
AST: 47 U/L — ABNORMAL HIGH (ref 15–41)
AST: 46 U/L — ABNORMAL HIGH (ref 15–41)
Albumin: 2.7 g/dL — ABNORMAL LOW (ref 3.5–5.0)
Albumin: 2.7 g/dL — ABNORMAL LOW (ref 3.5–5.0)
Alkaline Phosphatase: 42 U/L (ref 38–126)
Alkaline Phosphatase: 42 U/L (ref 38–126)
Anion gap: 13 (ref 5–15)
Anion gap: 9 (ref 5–15)
BUN: 7 mg/dL (ref 6–20)
BUN: 5 mg/dL — ABNORMAL LOW (ref 6–20)
CO2: 21 mmol/L — ABNORMAL LOW (ref 22–32)
CO2: 22 mmol/L (ref 22–32)
Calcium: 6.8 mg/dL — ABNORMAL LOW (ref 8.9–10.3)
Calcium: 7.1 mg/dL — ABNORMAL LOW (ref 8.9–10.3)
Chloride: 103 mmol/L (ref 98–111)
Chloride: 106 mmol/L (ref 98–111)
Creatinine, Ser: 0.77 mg/dL (ref 0.44–1.00)
Creatinine, Ser: 0.81 mg/dL (ref 0.44–1.00)
GFR calc Af Amer: >60 mL/min (ref >60)
GFR calc Af Amer: >60 mL/min (ref >60)
GFR calc non Af Amer: >60 mL/min (ref >60)
GFR calc non Af Amer: >60 mL/min (ref >60)
Glucose, Bld: 122 mg/dL — ABNORMAL HIGH (ref 70–99)
Glucose, Bld: 121 mg/dL — ABNORMAL HIGH (ref 70–99)
Potassium: 3.2 mmol/L — ABNORMAL LOW (ref 3.5–5.1)
Potassium: 2.9 mmol/L — ABNORMAL LOW (ref 3.5–5.1)
Sodium: 137 mmol/L (ref 135–145)
Sodium: 137 mmol/L (ref 135–145)
Total Bilirubin: 3.5 mg/dL — ABNORMAL HIGH (ref 0.3–1.2)
Total Bilirubin: 3.9 mg/dL — ABNORMAL HIGH (ref 0.3–1.2)
Total Protein: 5.9 g/dL — ABNORMAL LOW (ref 6.5–8.1)
Total Protein: 6.3 g/dL — ABNORMAL LOW (ref 6.5–8.1)

## 2020-08-21 LAB — CBC WITH DIFFERENTIAL/PLATELET
Abs Immature Granulocytes: 0.18 10*3/uL — ABNORMAL HIGH (ref 0.00–0.07)
Basophils Absolute: 0 10*3/uL (ref 0.0–0.1)
Basophils Relative: 0 %
Eosinophils Absolute: 0.2 10*3/uL (ref 0.0–0.5)
Eosinophils Relative: 2 %
HCT: 25.5 % — ABNORMAL LOW (ref 36.0–46.0)
Hemoglobin: 8 g/dL — ABNORMAL LOW (ref 12.0–15.0)
Immature Granulocytes: 1 %
Lymphocytes Relative: 8 %
Lymphs Abs: 1 10*3/uL (ref 0.7–4.0)
MCH: 26.3 pg (ref 26.0–34.0)
MCHC: 31.4 g/dL (ref 30.0–36.0)
MCV: 83.9 fL (ref 80.0–100.0)
Monocytes Absolute: 1.3 10*3/uL — ABNORMAL HIGH (ref 0.1–1.0)
Monocytes Relative: 10 %
Neutro Abs: 10.3 10*3/uL — ABNORMAL HIGH (ref 1.7–7.7)
Neutrophils Relative %: 79 %
Platelets: 62 10*3/uL — ABNORMAL LOW (ref 150–400)
RBC: 3.04 MIL/uL — ABNORMAL LOW (ref 3.87–5.11)
RDW: 20.4 % — ABNORMAL HIGH (ref 11.5–15.5)
WBC: 13 10*3/uL — ABNORMAL HIGH (ref 4.0–10.5)
nRBC: 0.3 % — ABNORMAL HIGH (ref 0.0–0.2)

## 2020-08-21 LAB — PHOSPHORUS
Phosphorus: 2 mg/dL — ABNORMAL LOW (ref 2.5–4.6)
Phosphorus: 2.1 mg/dL — ABNORMAL LOW (ref 2.5–4.6)

## 2020-08-21 LAB — LIPASE, BLOOD: Lipase: 107 U/L — ABNORMAL HIGH (ref 11–51)

## 2020-08-21 LAB — MAGNESIUM
Magnesium: 2.1 mg/dL (ref 1.7–2.4)
Magnesium: 1.9 mg/dL (ref 1.7–2.4)

## 2020-08-21 MED ORDER — POTASSIUM PHOSPHATES 15 MMOLE/5ML IV SOLN
30.0000 mmol | Freq: Once | INTRAVENOUS | Status: AC
  Administered 2020-08-22: 30 mmol via INTRAVENOUS
  Filled 2020-08-21: qty 10

## 2020-08-21 MED ORDER — POTASSIUM CHLORIDE 20 MEQ PO PACK
40.0000 meq | PACK | ORAL | Status: DC
  Administered 2020-08-21: 40 meq via ORAL
  Filled 2020-08-21: qty 2

## 2020-08-21 MED ORDER — CALCIUM GLUCONATE-NACL 2-0.675 GM/100ML-% IV SOLN
2.0000 g | Freq: Once | INTRAVENOUS | Status: DC
  Filled 2020-08-21: qty 100

## 2020-08-21 MED ORDER — SODIUM CHLORIDE 0.9% FLUSH
10.0000 mL | INTRAVENOUS | Status: DC | PRN
  Administered 2020-08-22 – 2020-08-23 (×2): 10 mL

## 2020-08-21 MED ORDER — POTASSIUM PHOSPHATES 15 MMOLE/5ML IV SOLN
30.0000 mmol | Freq: Once | INTRAVENOUS | Status: DC
  Filled 2020-08-21: qty 10

## 2020-08-21 MED ORDER — POTASSIUM PHOSPHATES 15 MMOLE/5ML IV SOLN: 30.0000 mmol | Freq: Once | INTRAVENOUS | Status: DC

## 2020-08-21 MED ORDER — CALCIUM GLUCONATE-NACL 2-0.675 GM/100ML-% IV SOLN
2.0000 g | Freq: Once | INTRAVENOUS | Status: AC
  Administered 2020-08-21: 2000 mg via INTRAVENOUS
  Filled 2020-08-21: qty 100

## 2020-08-21 MED ORDER — CALCIUM GLUCONATE-DEXTROSE 2-5 GM/100ML-% IV SOLN: 2.0000 g | Freq: Once | INTRAVENOUS | Status: DC

## 2020-08-21 MED ORDER — IOHEXOL 300 MG/ML  SOLN
100.0000 mL | Freq: Once | INTRAMUSCULAR | Status: AC | PRN
  Administered 2020-08-21: 100 mL via INTRAVENOUS

## 2020-08-21 MED ORDER — CALCIUM GLUCONATE-NACL 2-0.675 GM/100ML-% IV SOLN: 2.0000 g | Freq: Once | INTRAVENOUS | Status: DC

## 2020-08-21 MED ORDER — CHLORHEXIDINE GLUCONATE CLOTH 2 % EX PADS
6.0000 | MEDICATED_PAD | Freq: Every day | CUTANEOUS | Status: DC
  Administered 2020-08-22 – 2020-08-26 (×4): 6 via TOPICAL

## 2020-08-21 MED ORDER — SODIUM CHLORIDE 0.9 % IV SOLN
Freq: Once | INTRAVENOUS | Status: AC
  Filled 2020-08-21 (×2): qty 20

## 2020-08-21 NOTE — Progress Notes (Signed)
Peripherally Inserted Central Catheter Placement  The IV Nurse has discussed with the patient and/or persons authorized to consent for the patient, the purpose of this procedure and the potential benefits and risks involved with this procedure.  The benefits include less needle sticks, lab draws from the catheter, and the patient may be discharged home with the catheter. Risks include, but not limited to, infection, bleeding, blood clot (thrombus formation), and puncture of an artery; nerve damage and irregular heartbeat and possibility to perform a PICC exchange if needed/ordered by physician.  Alternatives to this procedure were also discussed.  Bard Power PICC patient education guide, fact sheet on infection prevention and patient information card has been provided to patient /or left at bedside.    PICC Placement Documentation  PICC Single Lumen 08/21/20 PICC Right Cephalic 39 cm 0 cm (Active)  Indication for Insertion or Continuance of Line Poor Vasculature-patient has had multiple peripheral attempts or PIVs lasting less than 24 hours 08/21/20 2311  Exposed Catheter (cm) 0 cm 08/21/20 2311  Site Assessment Clean;Dry;Intact 08/21/20 2311  Line Status Capped (central line);Blood return noted;Flushed 08/21/20 2311  Dressing Type Transparent;Occlusive 08/21/20 2311  Dressing Status Dry;Clean;Intact;Antimicrobial disc in place 08/21/20 2311  Safety Lock Not Applicable 93/73/42 8768  Line Care Connections checked and tightened 08/21/20 2311  Line Adjustment (NICU/IV Team Only) No 08/21/20 2311  Dressing Intervention New dressing 08/21/20 2311  Dressing Change Due 08/28/20 08/21/20 2311       Rosalio Macadamia Chenice 08/21/2020, 11:18 PM

## 2020-08-21 NOTE — Progress Notes (Signed)
° °  Subjective/Chief Complaint: Still with moderate to severe abdominal pain Having BM this morning and passing flatus   Objective: Vital signs in last 24 hours: Temp:  [98.6 F (37 C)-100.9 F (38.3 C)] 100.7 F (38.2 C) (09/06 0558) Pulse Rate:  [101-129] 113 (09/06 0651) Resp:  [17-25] 20 (09/06 0558) BP: (142-172)/(101-116) 148/101 (09/06 0651) SpO2:  [98 %-100 %] 98 % (09/06 0558) Last BM Date: 08/20/20  Intake/Output from previous day: 09/05 0701 - 09/06 0700 In: 5551.6 [P.O.:300; I.V.:4451.6; IV Piggyback:800] Out: -  Intake/Output this shift: No intake/output data recorded.  Exam: Awake and alert Uncomfortable Abdomen obese, full and tender in the epigastrium  Lab Results:  Recent Labs    08/20/20 0548 08/21/20 0541  WBC 15.0* 13.0*  HGB 9.4* 8.0*  HCT 30.2* 25.5*  PLT 70* 62*   BMET Recent Labs    08/20/20 2101 08/21/20 0541  NA 139 137  K 2.9* 3.2*  CL 104 103  CO2 21* 21*  GLUCOSE 125* 122*  BUN 9 7  CREATININE 0.79 0.77  CALCIUM 6.9* 6.8*   PT/INR No results for input(s): LABPROT, INR in the last 72 hours. ABG No results for input(s): PHART, HCO3 in the last 72 hours.  Invalid input(s): PCO2, PO2  Studies/Results: DG Abd Portable 2V  Result Date: 08/20/2020 CLINICAL DATA:  Lower abdominal pain.   pancreatitis. EXAM: PORTABLE ABDOMEN - 2 VIEW COMPARISON:  CT 08/18/2020 FINDINGS: Upright and supine views. The upright view demonstrates no free intraperitoneal air or significant air-fluid levels. The supine views demonstrate gas-filled bowel loops, favored to be small bowel including at up to 4.5 cm. Distal gas identified. IMPRESSION: Gaseous distension of small bowel loops, favoring adynamic ileus versus low-grade partial small bowel obstruction. No free intraperitoneal air or other acute complication. Electronically Signed   By: Abigail Miyamoto M.D.   On: 08/20/2020 11:52    Anti-infectives: Anti-infectives (From admission, onward)   None       Assessment/Plan: Pancreatitis with ileus  Given BM's, will hold on NG Still needs aggressive fluid management, electrolyte replacement Repeat CT pending No plans for any surgery currently  LOS: 2 days    Coralie Keens 08/21/2020

## 2020-08-21 NOTE — Progress Notes (Signed)
PROGRESS NOTE    Robin Guerrero   ZOX:[id number]  DOB: Feb 07, 1986  DOA: 08/18/2020     2  PCP: Trey Sailors, PA  CC: abdominal pain  Hospital Course: [name] is a 34 year old African-American female with PMH hypertension, anemia who presented to the ER with lower abdominal pain mostly in her left lower abdomen radiating to her left lower back.  She endorsed an increased level of alcohol use over the past year drinking 2-3 solo cups of mixed vodka and drinking approximately 1-2 drinks historically prior to that. On work-up she was found to have acute alcoholic pancreatitis.  Lipase was significantly elevated (almost 4000), with CT findings consistent with pancreatitis.  She was admitted for further work-up and treatment regarding pancreatitis. Lipase did downtrend and her abdominal pain improved after admission. However, on 08/20/20 due to decreased bowel sounds an abdominal xray was obtained and revealed possible ileus vs pSBO. She was kept NPO, an NGT was ordered and surgery consult was placed. Ultimately she did not tolerate the NGT and it was removed per patient almost immediately.    Interval History:  Repeat CT abd/pelvis today. She is still distended and nauseous, but no vomiting. Says she had a BM last night and is having some flatus.  She says the NGT yesterday made her panic/anxious and like she couldn't breathe and it hurt her throat.   Old records reviewed in assessment of this patient  ROS: Constitutional: positive for fatigue and malaise, Respiratory: negative for cough, Cardiovascular: negative for chest pain and Gastrointestinal: positive for abdominal pain  Assessment & Plan: Acute alcoholic pancreatitis -Patient endorses drinking approximately 2 to 3 cups (solo cups) of vodka with a mixture nightly for approximately the past year since the death of her mom.  She has been drinking 1 to 2 cups prior to that.   -No obvious abnormalities to suggest obstruction on  CT abdomen/pelvis and no obstructive pattern appreciated on LFTs -Clinical exam is consistent with pancreatitis, lipase elevated, and CT findings are also radiographically consistent with pancreatitis - today having more decreased bowel sounds (no worsening N/V and pain is actually improving); abdominal xray obtained shows possible ileus vs pSBO - will keep NPO, place NGT, and surgery consulted (patient did not tolerated NGT and removed it). Okay to leave out for now unless develops vomiting and/or serial xrays show worsening distension of colon - continue IVF, pain control, nausea control  - lipase has almost normalized; no further trending needed - repeat CT abd/pelvis on 9/6 given low grade fevers and complication of ileus with pancreatitis: no obvious pancreatic necrosis, stable pancreatitis  SIRS (systemic inflammatory response syndrome) (HCC) -Currently no suspicion for underlying infection.  See pancreatitis  Thrombocytopenia ([place]) -Considered due to underlying chronic alcohol use and acute pancreatitis.  May also be some hemodilution from fluids -4 T score one-point, very low probability of HIT - given further downtrend, d/c Lovenox on 9/6 and keep monitoring; no s/s bleeding at this time - if further drop <50, may need to consider more workup and/or hematology consult   Normocytic anemia -Also considered due to underlying alcohol use -Ferritin 126, iron stores adequate - Vitamin B12 588 -Folate low, 5.2.  Continue supplementation and MVI  Prolonged QT interval -Asymptomatic.  Avoid prolonging agents as able  Depression -No home meds noted.  Mood is normal at this time  Essential hypertension -She is unable to tolerate oral medications -Use labetalol or hydralazine as needed  Hypocalcemia -Replete and recheck as needed  Hypokalemia -Replete and recheck as needed  Hypomagnesemia -Replete and recheck as needed  Tobacco use -Offered nicotine patch if  needed   Antimicrobials: None  DVT prophylaxis: Lovenox Code Status: Full Family Communication: Brother via phone Disposition Plan: Status is: Inpatient  Remains inpatient appropriate because:Persistent severe electrolyte disturbances, Ongoing active pain requiring inpatient pain management, Unsafe d/c plan, IV treatments appropriate due to intensity of illness or inability to take PO and Inpatient level of care appropriate due to severity of illness   Dispo: The patient is from: Home              Anticipated d/c is to: Home              Anticipated d/c date is: > 3 days              Patient currently is not medically stable to d/c.  Objective: Blood pressure (!) 160/103, pulse (!) 124, temperature 99.7 F (37.6 C), temperature source Oral, resp. rate 18, height 5\' 8"  (1.727 m), weight 113.4 kg, SpO2 99 %.  Examination: General appearance: Young adult woman laying in bed appearing uncomfortable but no obvious distress Head: Normocephalic, without obvious abnormality, atraumatic Eyes: EOMI, mild scleral icterus appreciated Lungs: clear to auscultation bilaterally Heart: regular rate and rhythm and S1, S2 normal Abdomen: Obese, soft, mildly distended, moderate to severe tenderness to light palpation in left quadrants with mild guarding but no rebound.  Bowel sounds hypoactive Extremities: Trace lower extremity edema Skin: mobility and turgor normal Neurologic: Grossly normal  Consultants:   Surgery  Procedures:   None  Data Reviewed: I have personally reviewed following labs and imaging studies Results for orders placed or performed during the hospital encounter of 08/18/20 (from the past 24 hour(s))  Basic metabolic panel     Status: Abnormal   Collection Time: 08/20/20  2:41 PM  Result Value Ref Range   Sodium 135 135 - 145 mmol/L   Potassium 2.9 (L) 3.5 - 5.1 mmol/L   Chloride 104 98 - 111 mmol/L   CO2 21 (L) 22 - 32 mmol/L   Glucose, Bld 127 (H) 70 - 99 mg/dL    BUN 10 6 - 20 mg/dL   Creatinine, Ser 0.85 0.44 - 1.00 mg/dL   Calcium 5.9 (LL) 8.9 - 10.3 mg/dL   GFR calc non Af Amer >60 >60 mL/min   GFR calc Af Amer >60 >60 mL/min   Anion gap 10 5 - 15  Phosphorus     Status: Abnormal   Collection Time: 08/20/20  2:41 PM  Result Value Ref Range   Phosphorus <1.0 (LL) 2.5 - 4.6 mg/dL  Basic metabolic panel     Status: Abnormal   Collection Time: 08/20/20  9:01 PM  Result Value Ref Range   Sodium 139 135 - 145 mmol/L   Potassium 2.9 (L) 3.5 - 5.1 mmol/L   Chloride 104 98 - 111 mmol/L   CO2 21 (L) 22 - 32 mmol/L   Glucose, Bld 125 (H) 70 - 99 mg/dL   BUN 9 6 - 20 mg/dL   Creatinine, Ser 0.79 0.44 - 1.00 mg/dL   Calcium 6.9 (L) 8.9 - 10.3 mg/dL   GFR calc non Af Amer >60 >60 mL/min   GFR calc Af Amer >60 >60 mL/min   Anion gap 14 5 - 15  Phosphorus     Status: Abnormal   Collection Time: 08/20/20  9:01 PM  Result Value Ref Range   Phosphorus 1.4 (L) 2.5 -  4.6 mg/dL  CBC with Differential/Platelet     Status: Abnormal   Collection Time: 08/21/20  5:41 AM  Result Value Ref Range   WBC 13.0 (H) 4.0 - 10.5 K/uL   RBC 3.04 (L) 3.87 - 5.11 MIL/uL   Hemoglobin 8.0 (L) 12.0 - 15.0 g/dL   HCT 25.5 (L) 36 - 46 %   MCV 83.9 80.0 - 100.0 fL   MCH 26.3 26.0 - 34.0 pg   MCHC 31.4 30.0 - 36.0 g/dL   RDW 20.4 (H) 11.5 - 15.5 %   Platelets 62 (L) 150 - 400 K/uL   nRBC 0.3 (H) 0.0 - 0.2 %   Neutrophils Relative % 79 %   Neutro Abs 10.3 (H) 1.7 - 7.7 K/uL   Lymphocytes Relative 8 %   Lymphs Abs 1.0 0.7 - 4.0 K/uL   Monocytes Relative 10 %   Monocytes Absolute 1.3 (H) 0 - 1 K/uL   Eosinophils Relative 2 %   Eosinophils Absolute 0.2 0 - 0 K/uL   Basophils Relative 0 %   Basophils Absolute 0.0 0 - 0 K/uL   Immature Granulocytes 1 %   Abs Immature Granulocytes 0.18 (H) 0.00 - 0.07 K/uL  Comprehensive metabolic panel     Status: Abnormal   Collection Time: 08/21/20  5:41 AM  Result Value Ref Range   Sodium 137 135 - 145 mmol/L   Potassium 3.2 (L)  3.5 - 5.1 mmol/L   Chloride 103 98 - 111 mmol/L   CO2 21 (L) 22 - 32 mmol/L   Glucose, Bld 122 (H) 70 - 99 mg/dL   BUN 7 6 - 20 mg/dL   Creatinine, Ser 0.77 0.44 - 1.00 mg/dL   Calcium 6.8 (L) 8.9 - 10.3 mg/dL   Total Protein 5.9 (L) 6.5 - 8.1 g/dL   Albumin 2.7 (L) 3.5 - 5.0 g/dL   AST 47 (H) 15 - 41 U/L   ALT 22 0 - 44 U/L   Alkaline Phosphatase 42 38 - 126 U/L   Total Bilirubin 3.5 (H) 0.3 - 1.2 mg/dL   GFR calc non Af Amer >60 >60 mL/min   GFR calc Af Amer >60 >60 mL/min   Anion gap 13 5 - 15  Magnesium     Status: None   Collection Time: 08/21/20  5:41 AM  Result Value Ref Range   Magnesium 2.1 1.7 - 2.4 mg/dL  Phosphorus     Status: Abnormal   Collection Time: 08/21/20  5:41 AM  Result Value Ref Range   Phosphorus 2.0 (L) 2.5 - 4.6 mg/dL  Lipase, blood     Status: Abnormal   Collection Time: 08/21/20  5:41 AM  Result Value Ref Range   Lipase 107 (H) 11 - 51 U/L    Recent Results (from the past 240 hour(s))  SARS Coronavirus 2 by RT PCR (hospital order, performed in [place] hospital lab) Nasopharyngeal Nasopharyngeal Swab     Status: None   Collection Time: 08/18/20  3:37 PM   Specimen: Nasopharyngeal Swab  Result Value Ref Range Status   SARS Coronavirus 2 NEGATIVE NEGATIVE Final    Comment: (NOTE) SARS-CoV-2 target nucleic acids are NOT DETECTED.  The SARS-CoV-2 RNA is generally detectable in upper and lower respiratory specimens during the acute phase of infection. The lowest concentration of SARS-CoV-2 viral copies this assay can detect is 250 copies / mL. A negative result does not preclude SARS-CoV-2 infection and should not be used as the sole basis for treatment  or other patient management decisions.  A negative result may occur with improper specimen collection / handling, submission of specimen other than nasopharyngeal swab, presence of viral mutation(s) within the areas targeted by this assay, and inadequate number of viral copies (<250 copies /  mL). A negative result must be combined with clinical observations, patient history, and epidemiological information.  Fact Sheet for Patients:   StrictlyIdeas.no  Fact Sheet for Healthcare Providers: BankingDealers.co.za  This test is not yet approved or  cleared by the [place] FDA and has been authorized for detection and/or diagnosis of SARS-CoV-2 by FDA under an Emergency Use Authorization (EUA).  This EUA will remain in effect (meaning this test can be used) for the duration of the COVID-19 declaration under Section 564(b)(1) of the Act, 21 U.S.C. section 360bbb-3(b)(1), unless the authorization is terminated or revoked sooner.  Performed at [facility], [place] [address], [place], [place] [address]      Radiology Studies: CT ABDOMEN PELVIS W CONTRAST  Result Date: 08/21/2020 CLINICAL DATA:  Acute generalized abdominal pain. EXAM: CT ABDOMEN AND PELVIS WITH CONTRAST TECHNIQUE: Multidetector CT imaging of the abdomen and pelvis was performed using the standard protocol following bolus administration of intravenous contrast. CONTRAST:  196mL OMNIPAQUE IOHEXOL 300 MG/ML  SOLN COMPARISON:  August 18, 2020. FINDINGS: Lower chest: Bilateral pleural effusions are noted with adjacent subsegmental atelectasis, left greater than right. Hepatobiliary: No gallstones or biliary dilatation is noted. Hepatic steatosis is noted. Pancreas: Stable inflammatory changes are noted around the pancreas suggesting acute pancreatitis. There is no definite evidence of pancreatic necrosis seen at this time. No ductal dilatation is noted. Spleen: Normal in size without focal abnormality. Adrenals/Urinary Tract: Adrenal glands are unremarkable. Kidneys are normal, without renal calculi, focal lesion, or hydronephrosis. Bladder is unremarkable. Stomach/Bowel: The stomach appears normal. There is no evidence of bowel obstruction or inflammation.  Vascular/Lymphatic: No significant vascular findings are present. No enlarged abdominal or pelvic lymph nodes. Reproductive: Stable uterine fibroid is noted. No definite adnexal abnormality is noted. Other: Small amount of free fluid is noted in the pelvis. Mild anasarca is noted. No hernia is noted. Musculoskeletal: No acute or significant osseous findings. IMPRESSION: 1. Stable inflammatory changes are noted around the pancreas suggesting acute pancreatitis. There is no definite evidence of pancreatic necrosis seen at this time. 2. Bilateral pleural effusions are noted with adjacent subsegmental atelectasis, left greater than right. 3. Hepatic steatosis. 4. Stable uterine fibroid. 5. Small amount of free fluid is noted in the pelvis. 6. Mild anasarca. Electronically Signed   By: Marijo Conception M.D.   On: 08/21/2020 09:22   DG Abd Portable 2V  Result Date: 08/20/2020 CLINICAL DATA:  Lower abdominal pain.   pancreatitis. EXAM: PORTABLE ABDOMEN - 2 VIEW COMPARISON:  CT 08/18/2020 FINDINGS: Upright and supine views. The upright view demonstrates no free intraperitoneal air or significant air-fluid levels. The supine views demonstrate gas-filled bowel loops, favored to be small bowel including at up to 4.5 cm. Distal gas identified. IMPRESSION: Gaseous distension of small bowel loops, favoring adynamic ileus versus low-grade partial small bowel obstruction. No free intraperitoneal air or other acute complication. Electronically Signed   By: Abigail Miyamoto M.D.   On: 08/20/2020 11:52   CT ABDOMEN PELVIS W CONTRAST  Final Result    DG Abd Portable 2V  Final Result    CT ABDOMEN PELVIS W CONTRAST  Final Result      Scheduled Meds: . bisacodyl  10 mg Rectal Daily  .  folic acid  1 mg Intravenous Daily  . multivitamin with minerals  1 tablet Oral Daily  . nicotine  14 mg Transdermal Daily  . pantoprazole (PROTONIX) IV  40 mg Intravenous Q12H  . potassium & sodium phosphates  1 packet Oral TID WC & HS  .  potassium chloride  40 mEq Oral Q4H  . thiamine  100 mg Oral Daily   Or  . thiamine  100 mg Intravenous Daily   PRN Meds: acetaminophen **OR** acetaminophen, calcium carbonate, hydrALAZINE, labetalol, morphine injection, oxyCODONE Continuous Infusions: . sodium chloride 125 mL/hr at 08/21/20 0857      LOS: 2 days  Time spent: Greater than 50% of the 35 minute visit was spent in counseling/coordination of care for the patient as laid out in the A&P.   Dwyane Dee, MD [facility] 08/21/2020, 1:50 PM  Contact via secure chat.  To contact the attending provider between 7A-7P or the covering provider during after hours 7P-7A, please log into the web site [email or web address] and access using universal [place] password for that web site. If you do not have the password, please call the hospital operator.

## 2020-08-22 ENCOUNTER — Inpatient Hospital Stay: Payer: Self-pay

## 2020-08-22 LAB — COMPREHENSIVE METABOLIC PANEL
ALT: 22 U/L (ref 0–44)
AST: 43 U/L — ABNORMAL HIGH (ref 15–41)
Albumin: 2.6 g/dL — ABNORMAL LOW (ref 3.5–5.0)
Alkaline Phosphatase: 41 U/L (ref 38–126)
Anion gap: 8 (ref 5–15)
BUN: 6 mg/dL (ref 6–20)
CO2: 22 mmol/L (ref 22–32)
Calcium: 7.5 mg/dL — ABNORMAL LOW (ref 8.9–10.3)
Chloride: 106 mmol/L (ref 98–111)
Creatinine, Ser: 0.68 mg/dL (ref 0.44–1.00)
GFR calc Af Amer: >60 mL/min (ref >60)
GFR calc non Af Amer: >60 mL/min (ref >60)
Glucose, Bld: 127 mg/dL — ABNORMAL HIGH (ref 70–99)
Potassium: 2.9 mmol/L — ABNORMAL LOW (ref 3.5–5.1)
Sodium: 136 mmol/L (ref 135–145)
Total Bilirubin: 3.8 mg/dL — ABNORMAL HIGH (ref 0.3–1.2)
Total Protein: 5.8 g/dL — ABNORMAL LOW (ref 6.5–8.1)

## 2020-08-22 LAB — BASIC METABOLIC PANEL
Anion gap: 10 (ref 5–15)
BUN: 7 mg/dL (ref 6–20)
CO2: 21 mmol/L — ABNORMAL LOW (ref 22–32)
Calcium: 8.1 mg/dL — ABNORMAL LOW (ref 8.9–10.3)
Chloride: 105 mmol/L (ref 98–111)
Creatinine, Ser: 0.66 mg/dL (ref 0.44–1.00)
GFR calc Af Amer: >60 mL/min (ref >60)
GFR calc non Af Amer: >60 mL/min (ref >60)
Glucose, Bld: 110 mg/dL — ABNORMAL HIGH (ref 70–99)
Potassium: 3 mmol/L — ABNORMAL LOW (ref 3.5–5.1)
Sodium: 136 mmol/L (ref 135–145)

## 2020-08-22 LAB — CBC WITH DIFFERENTIAL/PLATELET
Abs Immature Granulocytes: 0.38 10*3/uL — ABNORMAL HIGH (ref 0.00–0.07)
Basophils Absolute: 0 10*3/uL (ref 0.0–0.1)
Basophils Relative: 0 %
Eosinophils Absolute: 0.2 10*3/uL (ref 0.0–0.5)
Eosinophils Relative: 2 %
HCT: 21.5 % — ABNORMAL LOW (ref 36.0–46.0)
Hemoglobin: 6.6 g/dL — CL (ref 12.0–15.0)
Immature Granulocytes: 3 %
Lymphocytes Relative: 7 %
Lymphs Abs: 0.8 10*3/uL (ref 0.7–4.0)
MCH: 26 pg (ref 26.0–34.0)
MCHC: 30.2 g/dL (ref 30.0–36.0)
MCV: 86 fL (ref 80.0–100.0)
Monocytes Absolute: 1.3 10*3/uL — ABNORMAL HIGH (ref 0.1–1.0)
Monocytes Relative: 10 %
Neutro Abs: 10 10*3/uL — ABNORMAL HIGH (ref 1.7–7.7)
Neutrophils Relative %: 78 %
Platelets: 55 10*3/uL — ABNORMAL LOW (ref 150–400)
RBC: 2.5 MIL/uL — ABNORMAL LOW (ref 3.87–5.11)
RDW: 21.2 % — ABNORMAL HIGH (ref 11.5–15.5)
WBC: 12.8 10*3/uL — ABNORMAL HIGH (ref 4.0–10.5)
nRBC: 0.7 % — ABNORMAL HIGH (ref 0.0–0.2)

## 2020-08-22 LAB — MAGNESIUM: Magnesium: 1.9 mg/dL (ref 1.7–2.4)

## 2020-08-22 LAB — PHOSPHORUS: Phosphorus: 2.9 mg/dL (ref 2.5–4.6)

## 2020-08-22 LAB — HEMOGLOBIN AND HEMATOCRIT, BLOOD
HCT: 24 % — ABNORMAL LOW (ref 36.0–46.0)
Hemoglobin: 7.7 g/dL — ABNORMAL LOW (ref 12.0–15.0)

## 2020-08-22 LAB — PREPARE RBC (CROSSMATCH)

## 2020-08-22 MED ORDER — LOSARTAN POTASSIUM 50 MG PO TABS
100.0000 mg | ORAL_TABLET | Freq: Every day | ORAL | Status: DC
  Administered 2020-08-22 – 2020-08-27 (×6): 100 mg via ORAL
  Filled 2020-08-22 (×6): qty 2

## 2020-08-22 MED ORDER — LOSARTAN POTASSIUM-HCTZ 100-25 MG PO TABS: 1.0000 | ORAL_TABLET | Freq: Every day | ORAL | Status: DC

## 2020-08-22 MED ORDER — HYDROCHLOROTHIAZIDE 25 MG PO TABS
25.0000 mg | ORAL_TABLET | Freq: Every day | ORAL | Status: DC
  Administered 2020-08-22 – 2020-08-25 (×4): 25 mg via ORAL
  Filled 2020-08-22 (×4): qty 1

## 2020-08-22 MED ORDER — POTASSIUM CHLORIDE 10 MEQ/100ML IV SOLN
10.0000 meq | INTRAVENOUS | Status: AC
  Administered 2020-08-22 (×4): 10 meq via INTRAVENOUS
  Filled 2020-08-22 (×4): qty 100

## 2020-08-22 MED ORDER — SODIUM CHLORIDE 0.9% IV SOLUTION: Freq: Once | INTRAVENOUS | Status: DC

## 2020-08-22 NOTE — Progress Notes (Signed)
CC: abdominal pain  Subjective: Still having pain and hungry. Pain ongoing mid epigastric area.  No nausea or vomiting. Asking for pain medicine now.  Pt to be transfused today  Objective: Vital signs in last 24 hours: Temp:  [99.2 F (37.3 C)-100.1 F (37.8 C)] 99.3 F (37.4 C) (09/07 0625) Pulse Rate:  [115-125] 124 (09/07 0625) Resp:  [18-20] 20 (09/07 0625) BP: (157-174)/(103-122) 160/117 (09/07 0625) SpO2:  [99 %-100 %] 100 % (09/07 0625) Last BM Date: 08/21/20 90 p.o. recorded 1100 IV Urine x14 BM x1 T-max 100.1 yesterday >> 99.3 this AM. Respiratory rate in the 20s Heart rate 110s to 130s Last blood pressure 160/117 K+ 2.9, mag 1.9, calcium 7.5 WBC 12.8 H/H 6.6/21.5; platelets 55K CT abdomen pelvis with contrast 9/6: Stable inflammatory changes are noted around the pancreas suggesting acute pancreatitis no definite evidence of pancreatic necrosis seen at this time.  Bilateral pleural effusions with adjacent subsegmental atelectasis left greater than right, hepatic steatosis, stable uterine fibroid, mild anasarca/small amount of free fluid in the pelvis.    intake/Output from previous day: 09/06 0701 - 09/07 0700 In: 1133.6 [P.O.:90; I.V.:789.1; IV Piggyback:254.5] Out: -  Intake/Output this shift: No intake/output data recorded.  General appearance: alert, cooperative and she is having ongoing pain, asking it it is time for her pain medcine.   GI: she is still having ongoing pain mid abdomen.  No distension, + BM  Lab Results:  Recent Labs    08/21/20 0541 08/22/20 0350  WBC 13.0* 12.8*  HGB 8.0* 6.6*  HCT 25.5* 21.5*  PLT 62* 55*    BMET Recent Labs    08/21/20 1706 08/22/20 0350  NA 137 136  K 2.9* 2.9*  CL 106 106  CO2 22 22  GLUCOSE 121* 127*  BUN 5* 6  CREATININE 0.81 0.68  CALCIUM 7.1* 7.5*   PT/INR No results for input(s): LABPROT, INR in the last 72 hours.  Recent Labs  Lab 08/19/20 0826 08/20/20 0548 08/21/20 0541  08/21/20 1706 08/22/20 0350  AST 63* 63* 47* 46* 43*  ALT 30 26 22 24 22   ALKPHOS 46 45 42 42 41  BILITOT 3.9* 4.0* 3.5* 3.9* 3.8*  PROT 6.2* 5.9* 5.9* 6.3* 5.8*  ALBUMIN 2.9* 2.7* 2.7* 2.7* 2.6*     Lipase     Component Value Date/Time   LIPASE 107 (H) 08/21/2020 0541     Medications: . sodium chloride   Intravenous Once  . bisacodyl  10 mg Rectal Daily  . Chlorhexidine Gluconate Cloth  6 each Topical Daily  . folic acid  1 mg Intravenous Daily  . multivitamin with minerals  1 tablet Oral Daily  . nicotine  14 mg Transdermal Daily  . pantoprazole (PROTONIX) IV  40 mg Intravenous Q12H  . thiamine  100 mg Oral Daily   Or  . thiamine  100 mg Intravenous Daily   . sodium chloride 125 mL/hr at 08/22/20 0121  . potassium chloride       Assessment/Plan SIRS Thrombocytopenia Normocytic anemia Tobacco use Alcohol use Hypokalemia  -K+ 2.8>> 2.9(9/7) Hypocalcemia   -CEA 5.8 >> 7.5(9/7) Anemia  -H/H 9.4/30.2>> 6.6/21.5 Thrombocytopenia  -Platelets 70 K>> 74Y(8/1)  Acute alcoholic pancreatitis with ileus  - lipase 1178(9/3) >> 107(9/6)  FEN: IV fluids/n.p.o. ID: None DVT: Lovenox on hold/SCDs Follow-up: TBD  Plan:  Currently no surgical, electrolytes being corrected. BM recorded for the last 2 day.  CT scan shows ongoing pancreatitis with no surgical role currently.  LOS: 3 days    Marlo Arriola 08/22/2020 Please see Amion

## 2020-08-22 NOTE — Progress Notes (Signed)
VAST consulted by lab to obtain BMP which was ordered. Upon arrival at bedside, pt receiving blood transfusion. Spoke with pt's nurse who stated blood tx should be finished around 1700. Labs rescheduled for 1900 per unit RN.

## 2020-08-22 NOTE — Progress Notes (Signed)
PROGRESS NOTE  Robin Guerrero IOE:[id number] DOB: 04-01-1986 DOA: 08/18/2020 PCP: Trey Sailors, PA  Brief History   Robin Guerrero is a 34 year old African-American female with PMH hypertension, anemia who presented to the ER with lower abdominal pain mostly in her left lower abdomen radiating to her left lower back.  She endorsed an increased level of alcohol use over the past year drinking 2-3 solo cups of mixed vodka and drinking approximately 1-2 drinks historically prior to that. On work-up she was found to have acute alcoholic pancreatitis.  Lipase was significantly elevated (almost 4000), with CT findings consistent with pancreatitis.  She was admitted for further work-up and treatment regarding pancreatitis. Lipase did downtrend and her abdominal pain improved after admission. However, on 08/20/20 due to decreased bowel sounds an abdominal xray was obtained and revealed possible ileus vs pSBO. She was kept NPO, an NGT was ordered and surgery consult was placed. Ultimately she did not tolerate the NGT and it was removed per patient almost immediately.    Interval History:  Repeat CT abd/pelvis today. She is still distended and nauseous, but no vomiting. Says she had a BM last night and is having some flatus.  She says the NGT yesterday made her panic/anxious and like she couldn't breathe and it hurt her throat.   Old records reviewed in assessment of this patient  Consultants   General surgery  Procedures   None  Antibiotics   Anti-infectives (From admission, onward)   None      Subjective  The patient is lying quietly in bed. She states that her pain is a little better, and that she is passing flatus. She states that her pain is more crampy. She does have an appetite.  Objective   Vitals:  Vitals:   08/22/20 1413 08/22/20 1446  BP: (!) 170/114 (!) 168/121  Pulse: (!) 116 (!) 120  Resp: 20 (!) 22  Temp: 99.3 F (37.4 C) 98.5 F (36.9 C)  SpO2: 100% 100%    Exam:  Constitutional:   The patient is awake, alert, and oriented x 3. Mild distress.   Respiratory:   No increased work of breathing.  No wheezes, rales, or rhonchi  No tactile fremitus Cardiovascular:   Regular rate and rhythm  No murmurs, ectopy, or gallups.  No lateral PMI. No thrills. Abdomen:   Abdomen is soft, diffusely tender, mildly distended  No hernias, masses, or organomegaly  Somewhat hyperactive bowel sounds.  Musculoskeletal:   No cyanosis, clubbing, or edema Skin:   No rashes, lesions, ulcers  palpation of skin: no induration or nodules Neurologic:   CN 2-12 intact  Sensation all 4 extremities intact Psychiatric:   Mental status o Mood, affect appropriate o Orientation to person, place, time   judgment and insight appear intact  I have personally reviewed the following:   Today's Data   Vitals, CMP, CBC  Imaging   CT abdomen and pelvis 08/18/2020 and 08/21/2020  Scheduled Meds:  sodium chloride   Intravenous Once   bisacodyl  10 mg Rectal Daily   Chlorhexidine Gluconate Cloth  6 each Topical Daily   folic acid  1 mg Intravenous Daily   losartan  100 mg Oral Daily   And   hydrochlorothiazide  25 mg Oral Daily   multivitamin with minerals  1 tablet Oral Daily   nicotine  14 mg Transdermal Daily   pantoprazole (PROTONIX) IV  40 mg Intravenous Q12H   thiamine  100 mg Oral Daily   Or  thiamine  100 mg Intravenous Daily   Continuous Infusions:  sodium chloride Stopped (08/22/20 1418)    Principal Problem:   Acute alcoholic pancreatitis Active Problems:   SIRS (systemic inflammatory response syndrome) (HCC)   Hypokalemia   Hypomagnesemia   Prolonged QT interval   Essential hypertension   Depression   Tobacco use   Hypocalcemia   Thrombocytopenia (HCC)   Normocytic anemia   LOS: 3 days   A & P  Acute alcoholic pancreatitis: Patient endorses drinking approximately 2 to 3 cups (solo cups) of vodka with  a mixture nightly for approximately the past year since the death of her mom.  She has been drinking 1 to 2 cups prior to that. No obvious abnormalities to suggest obstruction on CT abdomen/pelvis and no obstructive pattern appreciated on LFTs. Clinical exam is consistent with pancreatitis, lipase elevated, and CT findings are also radiographically consistent with pancreatitis. Pain continues to improve although her pain is now more cramping. She has continued to pass flatus. Abdominal xray obtained shows possible ileus vs pSBO. Pt was unable to tolerate NGT. She is now NPO. Okay to leave out for now unless develops vomiting and/or serial xrays show worsening distension of colon. She continues to receive pain control, IV fluids, and antiemetics. Her pancreatitis has a stable appearance on CT.   SIRS (systemic inflammatory response syndrome) ([place]): Currently no suspicion for underlying infection.  See pancreatitis  Thrombocytopenia ([place]): Considered due to underlying chronic alcohol use and acute pancreatitis.  May also be some hemodilution from fluids. 4 T score one-point, very low probability of HIT. Platelets have declined further to 55 today from 129 on admission. Monitor and avoid anticoagulants. If they drop further <50, may need to consider more workup and/or hematology consult.  Normocytic anemia: Due to ETOH abuse. Ferritin 126, iron stores adequate. Vitamin B12 is 588. Folate is low at 5.2.  Continue supplementation and MVI.  Prolonged QT interval:  Asymptomatic.  Avoid prolonging agents as able.  Depression: No home meds noted.  Mood is normal at this time  Essential hypertension: Blood pressures remain elevated despite IV labetalol and IV hydralazine. Will restart Cozaar with hydralazine as at home. Patient states that she thinks that she can tolerate it.  Hypocalcemia: Monitor and supplement as necessary.  Hypokalemia: Monitor and supplement as necessary.  Hypomagnesemia:  Monitor and supplement as necessary.  Tobacco use: Offered nicotine patch if needed  I have seen and examined this patient myself. I have spent 35 minutes in her evaluation and care.  DVT prophylaxis: Lovenox Code Status: Full Family Communication: Brother via phone Disposition Plan: Status is: Inpatient  Remains inpatient appropriate because:Persistent severe electrolyte disturbances, Ongoing active pain requiring inpatient pain management, Unsafe d/c plan, IV treatments appropriate due to intensity of illness or inability to take PO and Inpatient level of care appropriate due to severity of illness   Dispo: The patient is from: Home  Anticipated d/c is to: Home  Anticipated d/c date is: > 3 days  Patient currently is not medically stable to d/c.  Alvina Strother, DO [facility] Direct contact: see [email or web address]  7PM-7AM contact night coverage as above 08/22/2020, 4:11 PM  LOS: 3 days

## 2020-08-23 LAB — BPAM RBC
Blood Product Expiration Date: 202110032359
ISSUE DATE / TIME: 202109071423
Unit Type and Rh: 6200

## 2020-08-23 LAB — CBC WITH DIFFERENTIAL/PLATELET
Abs Immature Granulocytes: 0.91 10*3/uL — ABNORMAL HIGH (ref 0.00–0.07)
Basophils Absolute: 0.1 10*3/uL (ref 0.0–0.1)
Basophils Relative: 0 %
Eosinophils Absolute: 0.3 10*3/uL (ref 0.0–0.5)
Eosinophils Relative: 2 %
HCT: 23.8 % — ABNORMAL LOW (ref 36.0–46.0)
Hemoglobin: 7.6 g/dL — ABNORMAL LOW (ref 12.0–15.0)
Immature Granulocytes: 6 %
Lymphocytes Relative: 6 %
Lymphs Abs: 0.9 10*3/uL (ref 0.7–4.0)
MCH: 27.5 pg (ref 26.0–34.0)
MCHC: 31.9 g/dL (ref 30.0–36.0)
MCV: 86.2 fL (ref 80.0–100.0)
Monocytes Absolute: 1.6 10*3/uL — ABNORMAL HIGH (ref 0.1–1.0)
Monocytes Relative: 10 %
Neutro Abs: 11.5 10*3/uL — ABNORMAL HIGH (ref 1.7–7.7)
Neutrophils Relative %: 76 %
Platelets: 67 10*3/uL — ABNORMAL LOW (ref 150–400)
RBC: 2.76 MIL/uL — ABNORMAL LOW (ref 3.87–5.11)
RDW: 21 % — ABNORMAL HIGH (ref 11.5–15.5)
WBC: 15.3 10*3/uL — ABNORMAL HIGH (ref 4.0–10.5)
nRBC: 0.9 % — ABNORMAL HIGH (ref 0.0–0.2)

## 2020-08-23 LAB — COMPREHENSIVE METABOLIC PANEL
ALT: 26 U/L (ref 0–44)
AST: 49 U/L — ABNORMAL HIGH (ref 15–41)
Albumin: 2.6 g/dL — ABNORMAL LOW (ref 3.5–5.0)
Alkaline Phosphatase: 44 U/L (ref 38–126)
Anion gap: 9 (ref 5–15)
BUN: 6 mg/dL (ref 6–20)
CO2: 22 mmol/L (ref 22–32)
Calcium: 8.2 mg/dL — ABNORMAL LOW (ref 8.9–10.3)
Chloride: 105 mmol/L (ref 98–111)
Creatinine, Ser: 0.69 mg/dL (ref 0.44–1.00)
GFR calc Af Amer: >60 mL/min (ref >60)
GFR calc non Af Amer: >60 mL/min (ref >60)
Glucose, Bld: 92 mg/dL (ref 70–99)
Potassium: 3 mmol/L — ABNORMAL LOW (ref 3.5–5.1)
Sodium: 136 mmol/L (ref 135–145)
Total Bilirubin: 3.4 mg/dL — ABNORMAL HIGH (ref 0.3–1.2)
Total Protein: 6.4 g/dL — ABNORMAL LOW (ref 6.5–8.1)

## 2020-08-23 LAB — BASIC METABOLIC PANEL
Anion gap: 13 (ref 5–15)
BUN: 6 mg/dL (ref 6–20)
CO2: 21 mmol/L — ABNORMAL LOW (ref 22–32)
Calcium: 8.6 mg/dL — ABNORMAL LOW (ref 8.9–10.3)
Chloride: 100 mmol/L (ref 98–111)
Creatinine, Ser: 0.83 mg/dL (ref 0.44–1.00)
GFR calc Af Amer: >60 mL/min (ref >60)
GFR calc non Af Amer: >60 mL/min (ref >60)
Glucose, Bld: 104 mg/dL — ABNORMAL HIGH (ref 70–99)
Potassium: 3 mmol/L — ABNORMAL LOW (ref 3.5–5.1)
Sodium: 134 mmol/L — ABNORMAL LOW (ref 135–145)

## 2020-08-23 LAB — TYPE AND SCREEN
ABO/RH(D): A POS
Antibody Screen: NEGATIVE
Unit division: 0

## 2020-08-23 LAB — LIPASE, BLOOD: Lipase: 92 U/L — ABNORMAL HIGH (ref 11–51)

## 2020-08-23 LAB — MAGNESIUM: Magnesium: 2 mg/dL (ref 1.7–2.4)

## 2020-08-23 LAB — PHOSPHORUS: Phosphorus: 3.6 mg/dL (ref 2.5–4.6)

## 2020-08-23 MED ORDER — POTASSIUM CHLORIDE 10 MEQ/100ML IV SOLN
10.0000 meq | INTRAVENOUS | Status: AC
  Administered 2020-08-23 (×4): 10 meq via INTRAVENOUS
  Filled 2020-08-23 (×4): qty 100

## 2020-08-23 MED ORDER — AMLODIPINE BESYLATE 5 MG PO TABS
5.0000 mg | ORAL_TABLET | Freq: Every day | ORAL | Status: DC
  Administered 2020-08-23 – 2020-08-27 (×5): 5 mg via ORAL
  Filled 2020-08-23 (×5): qty 1

## 2020-08-23 MED ORDER — FOLIC ACID 1 MG PO TABS
1.0000 mg | ORAL_TABLET | Freq: Every day | ORAL | Status: DC
  Administered 2020-08-23 – 2020-08-27 (×5): 1 mg via ORAL
  Filled 2020-08-23 (×5): qty 1

## 2020-08-23 NOTE — Progress Notes (Addendum)
PROGRESS NOTE  Robin Guerrero:[id number] DOB: Nov 23, 1986 DOA: 08/18/2020 PCP: Robin Sailors, PA  Brief History   Robin Guerrero is a 34 year old African-American female with PMH hypertension, anemia who presented to the ER with lower abdominal pain mostly in her left lower abdomen radiating to her left lower back.  She endorsed an increased level of alcohol use over the past year drinking 2-3 solo cups of mixed vodka and drinking approximately 1-2 drinks historically prior to that. On work-up she was found to have acute alcoholic pancreatitis.  Lipase was significantly elevated (almost 4000), with CT findings consistent with pancreatitis.  She was admitted for further work-up and treatment regarding pancreatitis. Lipase did downtrend and her abdominal pain improved after admission. However, on 08/20/20 due to decreased bowel sounds an abdominal xray was obtained and revealed possible ileus vs pSBO. She was kept NPO, an NGT was ordered and surgery consult was placed. Ultimately she did not tolerate the NGT and it was removed per patient almost immediately.   Interval History:  Repeat CT abd/pelvis today. She is still distended and nauseous, but no vomiting. Says she had a BM last night and is having some flatus.  She says the NGT yesterday made her panic/anxious and like she couldn't breathe and it hurt her throat.   She has been NPO since admission.  Old records reviewed in assessment of this patient  Consultants   General surgery  Procedures   None  Antibiotics   Anti-infectives (From admission, onward)   None     Subjective  The patient is lying quietly in bed. She appears more comfortable. She states that she is feeling better. She has actually taken in 900 cc of water in the past 24 hours.  Objective   Vitals:  Vitals:   08/23/20 1051 08/23/20 1229  BP: (!) 186/131 (!) 183/120  Pulse: (!) 123 (!) 121  Resp: 20 (!) 21  Temp: 99.2 F (37.3 C) 99.2 F (37.3 C)   SpO2: 100% 99%   Exam:  Constitutional:   The patient is awake, alert, and oriented x 3. No acute distress.  Respiratory:   No increased work of breathing.  No wheezes, rales, or rhonchi  No tactile fremitus Cardiovascular:   Regular rate and rhythm  No murmurs, ectopy, or gallups.  No lateral PMI. No thrills. Abdomen:   Abdomen is soft, diffusely tender, mildly distended  No hernias, masses, or organomegaly  Somewhat hyperactive bowel sounds.  Musculoskeletal:   No cyanosis, clubbing, or edema Skin:   No rashes, lesions, ulcers  palpation of skin: no induration or nodules Neurologic:   CN 2-12 intact  Sensation all 4 extremities intact Psychiatric:   Mental status o Mood, affect appropriate o Orientation to person, place, time   judgment and insight appear intact  I have personally reviewed the following:   Today's Data   Vitals, CMP, CBC  Imaging   CT abdomen and pelvis 08/18/2020 and 08/21/2020  Scheduled Meds:  amLODipine  5 mg Oral Daily   bisacodyl  10 mg Rectal Daily   Chlorhexidine Gluconate Cloth  6 each Topical Daily   folic acid  1 mg Oral Daily   losartan  100 mg Oral Daily   And   hydrochlorothiazide  25 mg Oral Daily   multivitamin with minerals  1 tablet Oral Daily   nicotine  14 mg Transdermal Daily   pantoprazole (PROTONIX) IV  40 mg Intravenous Q12H   thiamine  100 mg Oral Daily  Or   thiamine  100 mg Intravenous Daily   Continuous Infusions:  sodium chloride Stopped (08/22/20 1418)    Principal Problem:   Acute alcoholic pancreatitis Active Problems:   SIRS (systemic inflammatory response syndrome) (HCC)   Hypokalemia   Hypomagnesemia   Prolonged QT interval   Essential hypertension   Depression   Tobacco use   Hypocalcemia   Thrombocytopenia (HCC)   Normocytic anemia   LOS: 4 days   A & P  Acute alcoholic pancreatitis: Patient endorses drinking approximately 2 to 3 cups (solo cups) of vodka  with a mixture nightly for approximately the past year since the death of her mom.  She has been drinking 1 to 2 cups prior to that. No obvious abnormalities to suggest obstruction on CT abdomen/pelvis and no obstructive pattern appreciated on LFTs. Clinical exam is consistent with pancreatitis, lipase elevated, and CT findings are also radiographically consistent with pancreatitis. Pain continues to improve although her pain is now more cramping. She has continued to pass flatus. Abdominal xray obtained shows possible ileus vs pSBO. Pt was unable to tolerate NGT. She is now NPO. Okay to leave out for now unless develops vomiting and/or serial xrays show worsening distension of colon. She continues to receive pain control, IV fluids, and antiemetics. Her pancreatitis has a stable appearance on CT. Will advance diet to clears.  SIRS (systemic inflammatory response syndrome) ([place]): Currently no suspicion for underlying infection.  See pancreatitis  Thrombocytopenia ([place]): Considered due to underlying chronic alcohol use and acute pancreatitis.  May also be some hemodilution from fluids. 4 T score one-point, very low probability of HIT. Platelets have declined further to 55 on 08/22/2020 from 129 on admission. Platelets today are improved at 67. Monitor and avoid anticoagulants. If they drop further or <50, may need to consider more workup and/or hematology consult.  Normocytic anemia: Due to ETOH abuse. Ferritin 126, iron stores adequate. Vitamin B12 is 588. Folate is low at 5.2.  Continue supplementation and MVI.Hemoglobin on 08/22/2020 was 6.6. The patient was transfused with one unit of PRBC's.   Leukocytosis: WBC is up to 15.3 from 12.8. 18 on admission.  Prolonged QT interval:  Asymptomatic.  Avoid prolonging agents as able.  Depression: No home meds noted.  Mood is normal at this time  Essential hypertension: Blood pressures remain elevated despite IV labetalol and IV hydralazine. Will restart  Cozaar with hydralazine as at home. Patient states that she thinks that she can tolerate it.  Hypocalcemia: Monitor and supplement as necessary.  Hypokalemia: Monitor and supplement as necessary.  Hypomagnesemia: Monitor and supplement as necessary.  Tobacco use: Offered nicotine patch if needed  I have seen and examined this patient myself. I have spent 35 minutes in her evaluation and care.  DVT prophylaxis: Lovenox Code Status: Full Family Communication: None present. Disposition Plan: Home Status is: Inpatient  Remains inpatient appropriate because:Persistent severe electrolyte disturbances, Ongoing active pain requiring inpatient pain management, Unsafe d/c plan, IV treatments appropriate due to intensity of illness or inability to take PO and Inpatient level of care appropriate due to severity of illness  Dispo: The patient is from: Home  Anticipated d/c is to: Home  Anticipated d/c date is: > 2 days  Patient currently is not medically stable to d/c.  Evelean Bigler, DO [facility] Direct contact: see [email or web address]  7PM-7AM contact night coverage as above 08/23/2020, 4:25 PM  LOS: 3 days

## 2020-08-23 NOTE — Progress Notes (Signed)
    CC: Abdominal pain  Subjective: She feels slightly better, but still having pain, even with ice chips she fill quickly.    Objective: Vital signs in last 24 hours: Temp:  [98.5 F (36.9 C)-100.4 F (38 C)] 98.8 F (37.1 C) (09/08 0504) Pulse Rate:  [113-120] 118 (09/08 0504) Resp:  [17-22] 20 (09/08 0504) BP: (162-185)/(111-139) 181/123 (09/08 0504) SpO2:  [99 %-100 %] 100 % (09/08 0504) Last BM Date: 08/22/20 960 PO recorded ? Ice chips 2500IV 900 urine Stool x 1 TM 100.4 Tachycardic HR 111-145 BP 181/123 this AM K+ 3.0 Lipase 92 WBC 15.3 H/H - stable after transfusion Intake/Output from previous day: 09/07 0701 - 09/08 0700 In: 3804.7 [P.O.:960; I.V.:2129.1; Blood:417.5; IV Piggyback:298.1] Out: 900 [Urine:900] Intake/Output this shift: No intake/output data recorded.  General appearance: alert, cooperative and no distress GI: soft, slightly less tender than yesterday.    Lab Results:  Recent Labs    08/22/20 0350 08/22/20 0350 08/22/20 1947 08/23/20 0407  WBC 12.8*  --   --  15.3*  HGB 6.6*   < > 7.7* 7.6*  HCT 21.5*   < > 24.0* 23.8*  PLT 55*  --   --  67*   < > = values in this interval not displayed.    BMET Recent Labs    08/22/20 1947 08/23/20 0407  NA 136 136  K 3.0* 3.0*  CL 105 105  CO2 21* 22  GLUCOSE 110* 92  BUN 7 6  CREATININE 0.66 0.69  CALCIUM 8.1* 8.2*   PT/INR No results for input(s): LABPROT, INR in the last 72 hours.  Recent Labs  Lab 08/20/20 0548 08/21/20 0541 08/21/20 1706 08/22/20 0350 08/23/20 0407  AST 63* 47* 46* 43* 49*  ALT 26 22 24 22 26   ALKPHOS 45 42 42 41 44  BILITOT 4.0* 3.5* 3.9* 3.8* 3.4*  PROT 5.9* 5.9* 6.3* 5.8* 6.4*  ALBUMIN 2.7* 2.7* 2.7* 2.6* 2.6*     Lipase     Component Value Date/Time   LIPASE 92 (H) 08/23/2020 0407     Medications: . bisacodyl  10 mg Rectal Daily  . Chlorhexidine Gluconate Cloth  6 each Topical Daily  . folic acid  1 mg Oral Daily  . losartan  100 mg Oral  Daily   And  . hydrochlorothiazide  25 mg Oral Daily  . multivitamin with minerals  1 tablet Oral Daily  . nicotine  14 mg Transdermal Daily  . pantoprazole (PROTONIX) IV  40 mg Intravenous Q12H  . thiamine  100 mg Oral Daily   Or  . thiamine  100 mg Intravenous Daily    Assessment/Plan SIRS Thrombocytopenia Normocytic anemia Tobacco use Alcohol use Hypokalemia  -K+ 2.8>> 2.9(9/7) >> 3.0 Hypocalcemia   -CEA 5.8 >> 7.5(9/7) Anemia  -H/H 9.4/30.2>> 6.6/21.5>>7.6/23.8  - transfused 08/22/20 Thrombocytopenia  -Platelets 70 K>> 55K(9/7) >> 60V  Acute alcoholic pancreatitis with ileus  - lipase 1178(9/3) >> 107(9/6) >> 92(9/8)  FEN: IV fluids/n.p.o.x ice chips ID: None DVT: Lovenox on hold/SCDs Follow-up: TBD  Plan:  Pt remains hemodynamically stable, no suggestion of bleeding into the pancrease.  We agree with ongoing medical management.  No surgical issue currently.  Please call if we can assist.      LOS: 4 days    Maydelin Deming 08/23/2020 Please see Amion

## 2020-08-23 NOTE — TOC Initial Note (Signed)
Transition of Care [facility]) - Initial/Assessment Note    Patient Details  Name: Robin Guerrero MRN: [id number] Date of Birth: [birth year]  [facility] CM/SW Contact:    Dessa Phi, RN Phone Number: 08/23/2020, 3:57 PM  Clinical Narrative: CM referral for SA resources-spoke to patient about resources-states she has it under controlled;declines any resources.                   Expected Discharge Plan: Home/Self Care Barriers to Discharge: Continued Medical Work up   Patient Goals and CMS Choice Patient states their goals for this hospitalization and ongoing recovery are:: go home CMS Medicare.gov Compare Post Acute Care list provided to:: Patient Choice offered to / list presented to : NA  Expected Discharge Plan and Services Expected Discharge Plan: Home/Self Care   Discharge Planning Services: CM Consult   Living arrangements for the past 2 months: Single Family Home                                      Prior Living Arrangements/Services Living arrangements for the past 2 months: Single Family Home Lives with:: Self   Do you feel safe going back to the place where you live?: Yes               Activities of Daily Living Home Assistive Devices/Equipment: None ADL Screening (condition at time of admission) Patient's cognitive ability adequate to safely complete daily activities?: Yes Is the patient deaf or have difficulty hearing?: No Does the patient have difficulty seeing, even when wearing glasses/contacts?: No Does the patient have difficulty concentrating, remembering, or making decisions?: No Patient able to express need for assistance with ADLs?: Yes Does the patient have difficulty dressing or bathing?: No Independently performs ADLs?: Yes (appropriate for developmental age) (having weakness and dizziness) Does the patient have difficulty walking or climbing stairs?: Yes (secondary to weakness and dizziness) Weakness of Legs:  Both Weakness of Arms/Hands: None  Permission Sought/Granted Permission sought to share information with : Case Manager Permission granted to share information with : Yes, Verbal Permission Granted  Share Information with NAME: Case Manager           Emotional Assessment Appearance:: Appears stated age Attitude/Demeanor/Rapport: Gracious Affect (typically observed): Accepting Orientation: : Oriented to Self, Oriented to Place, Oriented to  Time, Oriented to Situation Alcohol / Substance Use: Tobacco Use, Alcohol Use Psych Involvement: No (comment)  Admission diagnosis:  Alcoholic pancreatitis [H84.69] Alcohol-induced acute pancreatitis without infection or necrosis [G29.52] Acute alcoholic pancreatitis [W41.32] Patient Active Problem List   Diagnosis Date Noted   Hypocalcemia 44/12/270   Acute alcoholic pancreatitis 53/66/4403   Thrombocytopenia ([place]) 08/19/2020   Normocytic anemia 47/42/5956   Alcoholic pancreatitis 38/75/6433   SIRS (systemic inflammatory response syndrome) (HCC) 08/18/2020   Hypokalemia 08/18/2020   Hypomagnesemia 08/18/2020   Prolonged QT interval 08/18/2020   Essential hypertension 08/18/2020   Depression 08/18/2020   Tobacco use 08/18/2020   PCP:  Trey Sailors, PA Pharmacy:   [id number] - [place], [address] [place] [place] [address] Phone: [phone] Fax: [phone]     Social Determinants of Health (SDOH) Interventions    Readmission Risk Interventions No flowsheet data found.

## 2020-08-24 LAB — CBC WITH DIFFERENTIAL/PLATELET
Abs Immature Granulocytes: 1.08 10*3/uL — ABNORMAL HIGH (ref 0.00–0.07)
Basophils Absolute: 0.1 10*3/uL (ref 0.0–0.1)
Basophils Relative: 1 %
Eosinophils Absolute: 0.2 10*3/uL (ref 0.0–0.5)
Eosinophils Relative: 1 %
HCT: 23.6 % — ABNORMAL LOW (ref 36.0–46.0)
Hemoglobin: 7.5 g/dL — ABNORMAL LOW (ref 12.0–15.0)
Immature Granulocytes: 7 %
Lymphocytes Relative: 6 %
Lymphs Abs: 1 10*3/uL (ref 0.7–4.0)
MCH: 27.4 pg (ref 26.0–34.0)
MCHC: 31.8 g/dL (ref 30.0–36.0)
MCV: 86.1 fL (ref 80.0–100.0)
Monocytes Absolute: 1.6 10*3/uL — ABNORMAL HIGH (ref 0.1–1.0)
Monocytes Relative: 10 %
Neutro Abs: 11.8 10*3/uL — ABNORMAL HIGH (ref 1.7–7.7)
Neutrophils Relative %: 75 %
Platelets: 90 10*3/uL — ABNORMAL LOW (ref 150–400)
RBC: 2.74 MIL/uL — ABNORMAL LOW (ref 3.87–5.11)
RDW: 21.5 % — ABNORMAL HIGH (ref 11.5–15.5)
WBC: 15.7 10*3/uL — ABNORMAL HIGH (ref 4.0–10.5)
nRBC: 0.6 % — ABNORMAL HIGH (ref 0.0–0.2)

## 2020-08-24 LAB — COMPREHENSIVE METABOLIC PANEL
ALT: 28 U/L (ref 0–44)
AST: 47 U/L — ABNORMAL HIGH (ref 15–41)
Albumin: 2.7 g/dL — ABNORMAL LOW (ref 3.5–5.0)
Alkaline Phosphatase: 57 U/L (ref 38–126)
Anion gap: 9 (ref 5–15)
BUN: 7 mg/dL (ref 6–20)
CO2: 24 mmol/L (ref 22–32)
Calcium: 8.3 mg/dL — ABNORMAL LOW (ref 8.9–10.3)
Chloride: 101 mmol/L (ref 98–111)
Creatinine, Ser: 0.8 mg/dL (ref 0.44–1.00)
GFR calc Af Amer: >60 mL/min (ref >60)
GFR calc non Af Amer: >60 mL/min (ref >60)
Glucose, Bld: 100 mg/dL — ABNORMAL HIGH (ref 70–99)
Potassium: 2.8 mmol/L — ABNORMAL LOW (ref 3.5–5.1)
Sodium: 134 mmol/L — ABNORMAL LOW (ref 135–145)
Total Bilirubin: 3.1 mg/dL — ABNORMAL HIGH (ref 0.3–1.2)
Total Protein: 6.3 g/dL — ABNORMAL LOW (ref 6.5–8.1)

## 2020-08-24 LAB — MAGNESIUM: Magnesium: 1.7 mg/dL (ref 1.7–2.4)

## 2020-08-24 LAB — PHOSPHORUS: Phosphorus: 3.8 mg/dL (ref 2.5–4.6)

## 2020-08-24 MED ORDER — POTASSIUM CHLORIDE 10 MEQ/100ML IV SOLN
INTRAVENOUS | Status: AC
  Administered 2020-08-24: 10 meq via INTRAVENOUS
  Filled 2020-08-24: qty 100

## 2020-08-24 MED ORDER — POTASSIUM CHLORIDE 20 MEQ PO PACK
20.0000 meq | PACK | Freq: Two times a day (BID) | ORAL | Status: AC
  Administered 2020-08-24 – 2020-08-25 (×4): 20 meq via ORAL
  Filled 2020-08-24 (×4): qty 1

## 2020-08-24 MED ORDER — METOPROLOL TARTRATE 25 MG PO TABS
25.0000 mg | ORAL_TABLET | Freq: Two times a day (BID) | ORAL | Status: DC
  Administered 2020-08-24 – 2020-08-26 (×4): 25 mg via ORAL
  Filled 2020-08-24 (×4): qty 1

## 2020-08-24 MED ORDER — POTASSIUM CHLORIDE 10 MEQ/100ML IV SOLN
10.0000 meq | INTRAVENOUS | Status: AC
  Administered 2020-08-24 (×3): 10 meq via INTRAVENOUS
  Filled 2020-08-24 (×3): qty 100

## 2020-08-24 NOTE — Progress Notes (Signed)
PROGRESS NOTE  Robin Guerrero NLZ:[id number] DOB: 01-19-1986 DOA: 08/18/2020 PCP: Trey Sailors, PA  Brief History   Robin Guerrero is a 34 year old African-American female with PMH hypertension, anemia who presented to the ER with lower abdominal pain mostly in her left lower abdomen radiating to her left lower back.  She endorsed an increased level of alcohol use over the past year drinking 2-3 solo cups of mixed vodka and drinking approximately 1-2 drinks historically prior to that. On work-up she was found to have acute alcoholic pancreatitis.  Lipase was significantly elevated (almost 4000), with CT findings consistent with pancreatitis.  She was admitted for further work-up and treatment regarding pancreatitis. Lipase did downtrend and her abdominal pain improved after admission. However, on 08/20/20 due to decreased bowel sounds an abdominal xray was obtained and revealed possible ileus vs pSBO. She was kept NPO, an NGT was ordered and surgery consult was placed. Ultimately she did not tolerate the NGT and it was removed per patient almost immediately.   Interval History:  Repeat CT abd/pelvis today. She is still distended and nauseous, but no vomiting. Says she had a BM last night and is having some flatus.  She says the NGT yesterday made her panic/anxious and like she couldn't breathe and it hurt her throat.   She has been NPO from admission until 08/23/2020 when she was advanced to clear liquids. On 08/24/2020 she has been advanced to a full liquid diet.  Old records reviewed in assessment of this patient  Consultants  . General surgery  Procedures  . None  Antibiotics   Anti-infectives (From admission, onward)   None     Subjective  The patient is lying quietly in bed. She looks as though she is feeling better, although she admits that she has not really tolerated her clears well. She is agreeable to trying to advance her diet further to full liquids. She is encouraged to let  nursing know if she has more pain or nausea after she eats.  Objective   Vitals:  Vitals:   08/24/20 0531 08/24/20 1428  BP: (!) 171/105 (!) 182/136  Pulse: (!) 120 (!) 133  Resp: 20 19  Temp: 99.6 F (37.6 C) 99.6 F (37.6 C)  SpO2: 100% 100%   Exam:  Constitutional:  . The patient is awake, alert, and oriented x 3. No acute distress.  Respiratory:  . No increased work of breathing. . No wheezes, rales, or rhonchi . No tactile fremitus Cardiovascular:  . Regular rate and rhythm . No murmurs, ectopy, or gallups. . No lateral PMI. No thrills. Abdomen:  . Abdomen is soft, diffusely tender, mildly distended . No hernias, masses, or organomegaly . Somewhat hyperactive bowel sounds.  Musculoskeletal:  . No cyanosis, clubbing, or edema Skin:  . No rashes, lesions, ulcers . palpation of skin: no induration or nodules Neurologic:  . CN 2-12 intact . Sensation all 4 extremities intact Psychiatric:  . Mental status o Mood, affect appropriate o Orientation to person, place, time  . judgment and insight appear intact  I have personally reviewed the following:   Today's Data  . Vitals, CMP, CBC  Imaging  . CT abdomen and pelvis 08/18/2020 and 08/21/2020  Scheduled Meds: . amLODipine  5 mg Oral Daily  . bisacodyl  10 mg Rectal Daily  . Chlorhexidine Gluconate Cloth  6 each Topical Daily  . folic acid  1 mg Oral Daily  . losartan  100 mg Oral Daily   And  .  hydrochlorothiazide  25 mg Oral Daily  . multivitamin with minerals  1 tablet Oral Daily  . nicotine  14 mg Transdermal Daily  . pantoprazole (PROTONIX) IV  40 mg Intravenous Q12H  . potassium chloride  20 mEq Oral BID  . thiamine  100 mg Oral Daily   Or  . thiamine  100 mg Intravenous Daily   Continuous Infusions: . sodium chloride Stopped (08/22/20 1418)    Principal Problem:   Acute alcoholic pancreatitis Active Problems:   SIRS (systemic inflammatory response syndrome) (HCC)   Hypokalemia    Hypomagnesemia   Prolonged QT interval   Essential hypertension   Depression   Tobacco use   Hypocalcemia   Thrombocytopenia (HCC)   Normocytic anemia   LOS: 5 days   A & P  Acute alcoholic pancreatitis: Patient endorses drinking approximately 2 to 3 cups (solo cups) of vodka with a mixture nightly for approximately the past year since the death of her mom.  She has been drinking 1 to 2 cups prior to that. No obvious abnormalities to suggest obstruction on CT abdomen/pelvis and no obstructive pattern appreciated on LFTs. Clinical exam is consistent with pancreatitis, lipase elevated, and CT findings are also radiographically consistent with pancreatitis. Pain continues to improve although her pain is now more cramping. She has continued to pass flatus. Abdominal xray obtained shows possible ileus vs pSBO. Pt was unable to tolerate NGT. She is now NPO. Okay to leave out for now unless develops vomiting and/or serial xrays show worsening distension of colon. She continues to receive pain control, IV fluids, and antiemetics. Her pancreatitis has a stable appearance on CT. Will advance diet to clears.  SIRS (systemic inflammatory response syndrome) ([place]): Currently no suspicion for underlying infection.  See pancreatitis.  Hypokalemia: supplement and monitor.  Thrombocytopenia ([place]): Considered due to underlying chronic alcohol use and acute pancreatitis.  May also be some hemodilution from fluids. 4 T score one-point, very low probability of HIT. Platelets have declined further to 55 on 08/22/2020 from 129 on admission. Platelets today are improved at 67. Monitor and avoid anticoagulants. If they drop further or <50, may need to consider more workup and/or hematology consult.  Normocytic anemia: Due to ETOH abuse. Ferritin 126, iron stores adequate.  Vitamin B12 is 588. Folate is low at 5.2.  Continue supplementation and MVI.Hemoglobin on 08/22/2020 was 6.6. The patient was transfused with one unit  of PRBC's.   Leukocytosis: WBC is up to 15.7 from 12.8. 18 on admission.  Prolonged QT interval:  Asymptomatic.  Avoid prolonging agents as able.  Depression: No home meds noted.  Mood is normal at this time  Essential hypertension: Blood pressures remain elevated despite IV labetalol and IV hydralazine. Will restart Cozaar with hydralazine as at home. Patient states that she thinks that she can tolerate it.  Hypocalcemia: Monitor and supplement as necessary.  Hypokalemia: Monitor and supplement as necessary.  Hypomagnesemia: Monitor and supplement as necessary.  Tobacco use: Offered nicotine patch if needed  I have seen and examined this patient myself. I have spent 32 minutes in her evaluation and care.  DVT prophylaxis: Lovenox Code Status: Full Family Communication: None present. Disposition Plan: Home Status is: Inpatient  Remains inpatient appropriate because:Persistent severe electrolyte disturbances. Ongoing active pain requiring inpatient pain management, Unsafe d/c plan, IV treatments appropriate due to intensity of illness or inability to take PO and Inpatient level of care appropriate due to severity of illness  Dispo: The patient is from: Home  Anticipated d/c is to: Home  Anticipated d/c date is: > 2 days  Patient currently is not medically stable to d/c.  Robin Devoto, DO [facility] Direct contact: see [email or web address]  7PM-7AM contact night coverage as above  08/24/2020, 4:45 PM  LOS: 3 days

## 2020-08-25 LAB — COMPREHENSIVE METABOLIC PANEL
ALT: 31 U/L (ref 0–44)
AST: 48 U/L — ABNORMAL HIGH (ref 15–41)
Albumin: 2.7 g/dL — ABNORMAL LOW (ref 3.5–5.0)
Alkaline Phosphatase: 62 U/L (ref 38–126)
Anion gap: 13 (ref 5–15)
BUN: 7 mg/dL (ref 6–20)
CO2: 24 mmol/L (ref 22–32)
Calcium: 8.4 mg/dL — ABNORMAL LOW (ref 8.9–10.3)
Chloride: 96 mmol/L — ABNORMAL LOW (ref 98–111)
Creatinine, Ser: 0.75 mg/dL (ref 0.44–1.00)
GFR calc Af Amer: >60 mL/min (ref >60)
GFR calc non Af Amer: >60 mL/min (ref >60)
Glucose, Bld: 119 mg/dL — ABNORMAL HIGH (ref 70–99)
Potassium: 3 mmol/L — ABNORMAL LOW (ref 3.5–5.1)
Sodium: 133 mmol/L — ABNORMAL LOW (ref 135–145)
Total Bilirubin: 1.9 mg/dL — ABNORMAL HIGH (ref 0.3–1.2)
Total Protein: 6.5 g/dL (ref 6.5–8.1)

## 2020-08-25 LAB — CBC WITH DIFFERENTIAL/PLATELET
Abs Immature Granulocytes: 1.18 10*3/uL — ABNORMAL HIGH (ref 0.00–0.07)
Basophils Absolute: 0.1 10*3/uL (ref 0.0–0.1)
Basophils Relative: 0 %
Eosinophils Absolute: 0.2 10*3/uL (ref 0.0–0.5)
Eosinophils Relative: 1 %
HCT: 23.6 % — ABNORMAL LOW (ref 36.0–46.0)
Hemoglobin: 7.4 g/dL — ABNORMAL LOW (ref 12.0–15.0)
Immature Granulocytes: 7 %
Lymphocytes Relative: 7 %
Lymphs Abs: 1.2 10*3/uL (ref 0.7–4.0)
MCH: 27.2 pg (ref 26.0–34.0)
MCHC: 31.4 g/dL (ref 30.0–36.0)
MCV: 86.8 fL (ref 80.0–100.0)
Monocytes Absolute: 1.5 10*3/uL — ABNORMAL HIGH (ref 0.1–1.0)
Monocytes Relative: 9 %
Neutro Abs: 12.9 10*3/uL — ABNORMAL HIGH (ref 1.7–7.7)
Neutrophils Relative %: 76 %
Platelets: 128 10*3/uL — ABNORMAL LOW (ref 150–400)
RBC: 2.72 MIL/uL — ABNORMAL LOW (ref 3.87–5.11)
RDW: 21.3 % — ABNORMAL HIGH (ref 11.5–15.5)
WBC: 17 10*3/uL — ABNORMAL HIGH (ref 4.0–10.5)
nRBC: 0.5 % — ABNORMAL HIGH (ref 0.0–0.2)

## 2020-08-25 LAB — LIPASE, BLOOD: Lipase: 109 U/L — ABNORMAL HIGH (ref 11–51)

## 2020-08-25 MED ORDER — PANTOPRAZOLE SODIUM 40 MG PO TBEC
40.0000 mg | DELAYED_RELEASE_TABLET | Freq: Two times a day (BID) | ORAL | Status: DC
  Administered 2020-08-25 – 2020-08-27 (×4): 40 mg via ORAL
  Filled 2020-08-25 (×4): qty 1

## 2020-08-25 MED ORDER — HYDRALAZINE HCL 50 MG PO TABS
50.0000 mg | ORAL_TABLET | Freq: Four times a day (QID) | ORAL | Status: DC | PRN
  Administered 2020-08-25 – 2020-08-27 (×4): 50 mg via ORAL
  Filled 2020-08-25 (×4): qty 1

## 2020-08-25 NOTE — Progress Notes (Signed)

## 2020-08-25 NOTE — Progress Notes (Signed)
PROGRESS NOTE  Robin Guerrero CHY:[id number] DOB: 1986/02/12 DOA: 08/18/2020 PCP: Trey Sailors, PA  Brief History   [name] is a 34 year old African-American female with PMH hypertension, anemia who presented to the ER with lower abdominal pain mostly in her left lower abdomen radiating to her left lower back.  She endorsed an increased level of alcohol use over the past year drinking 2-3 solo cups of mixed vodka and drinking approximately 1-2 drinks historically prior to that. On work-up she was found to have acute alcoholic pancreatitis.  Lipase was significantly elevated (almost 4000), with CT findings consistent with pancreatitis.  She was admitted for further work-up and treatment regarding pancreatitis. Lipase did downtrend and her abdominal pain improved after admission. However, on 08/20/20 due to decreased bowel sounds an abdominal xray was obtained and revealed possible ileus vs pSBO. She was kept NPO, an NGT was ordered and surgery consult was placed. Ultimately she did not tolerate the NGT and it was removed per patient almost immediately.   Interval History:  Repeat CT abd/pelvis today. She is still distended and nauseous, but no vomiting. Says she had a BM last night and is having some flatus.  She says the NGT yesterday made her panic/anxious and like she couldn't breathe and it hurt her throat.   She has been NPO from admission until 08/23/2020 when she was advanced to clear liquids. On 08/24/2020 she has been advanced to a heart healthy diet. However, she is really not eating much and continues to have pain. Lipase has increased. Will reverse diet to clears.  Old records reviewed in assessment of this patient  Consultants  . General surgery  Procedures  . None  Antibiotics   Anti-infectives (From admission, onward)   None     Subjective  The patient is lying quietly in bed. Pt appreciates advancement of diet, however, she has not been able to tolerate it much. She  is taking pain meds prior to meals so she doesn't have pain.  Objective   Vitals:  Vitals:   08/25/20 1418 08/25/20 1653  BP: (!) 152/106 (!) 147/97  Pulse: (!) 111 (!) 114  Resp:  18  Temp:  99.2 F (37.3 C)  SpO2:  100%   Exam:  Constitutional:  . The patient is awake, alert, and oriented x 3. No acute distress.  Respiratory:  . No increased work of breathing. . No wheezes, rales, or rhonchi . No tactile fremitus Cardiovascular:  . Regular rate and rhythm . No murmurs, ectopy, or gallups. . No lateral PMI. No thrills. Abdomen:  . Abdomen is soft, diffusely tender, mildly distended . No hernias, masses, or organomegaly . Somewhat hyperactive bowel sounds.  Musculoskeletal:  . No cyanosis, clubbing, or edema Skin:  . No rashes, lesions, ulcers . palpation of skin: no induration or nodules Neurologic:  . CN 2-12 intact . Sensation all 4 extremities intact Psychiatric:  . Mental status o Mood, affect appropriate o Orientation to person, place, time  . judgment and insight appear intact  I have personally reviewed the following:   Today's Data  . Vitals, CMP, CBC  Imaging  . CT abdomen and pelvis 08/18/2020 and 08/21/2020  Scheduled Meds: . amLODipine  5 mg Oral Daily  . bisacodyl  10 mg Rectal Daily  . Chlorhexidine Gluconate Cloth  6 each Topical Daily  . folic acid  1 mg Oral Daily  . losartan  100 mg Oral Daily   And  . hydrochlorothiazide  25 mg Oral  Daily  . metoprolol tartrate  25 mg Oral BID  . multivitamin with minerals  1 tablet Oral Daily  . nicotine  14 mg Transdermal Daily  . pantoprazole  40 mg Oral BID  . potassium chloride  20 mEq Oral BID  . thiamine  100 mg Oral Daily   Continuous Infusions: . sodium chloride 125 mL/hr at 08/25/20 1508    Principal Problem:   Acute alcoholic pancreatitis Active Problems:   SIRS (systemic inflammatory response syndrome) (HCC)   Hypokalemia   Hypomagnesemia   Prolonged QT interval   Essential  hypertension   Depression   Tobacco use   Hypocalcemia   Thrombocytopenia (HCC)   Normocytic anemia   LOS: 6 days   A & P  Acute alcoholic pancreatitis: Patient endorses drinking approximately 2 to 3 cups (solo cups) of vodka with a mixture nightly for approximately the past year since the death of her mom.  She has been drinking 1 to 2 cups prior to that. No obvious abnormalities to suggest obstruction on CT abdomen/pelvis and no obstructive pattern appreciated on LFTs. Clinical exam is consistent with pancreatitis, lipase elevated, and CT findings are also radiographically consistent with pancreatitis. Pain continues to improve although her pain is now more cramping. She has continued to pass flatus. Abdominal xray obtained shows possible ileus vs pSBO. Pt was unable to tolerate NGT. She is now NPO. Okay to leave out for now unless develops vomiting and/or serial xrays show worsening distension of colon. She continues to receive pain control, IV fluids, and antiemetics. Her pancreatitis has a stable appearance on CT. Diet had been advanced to full liquid and then heart healthy. However, the patient has not been able to   SIRS (systemic inflammatory response syndrome) ([place]): Currently no suspicion for underlying infection.  See pancreatitis.  Hypokalemia: supplement and monitor.  Thrombocytopenia ([place]): Resolving. Considered due to underlying chronic alcohol use and acute pancreatitis.  May also be some hemodilution from fluids. 4 T score one-point, very low probability of HIT. Platelets have declined further to 55 on 08/22/2020 from 129 on admission. Platelets today are improved at 128 after a nadir of 57. Monitor and avoid anticoagulants. If they drop further or <50, may need to consider more workup and/or hematology consult.  Normocytic anemia: Due to ETOH abuse. Ferritin 126, iron stores adequate.  Hyperbilirubinemia: Will order right upper quadrant ultrasound. Possible biliary  pancreatitis.   Vitamin B12 is 588. Folate is low at 5.2.  Continue supplementation and MVI.Hemoglobin on 08/22/2020 was 6.6. The patient was transfused with one unit of PRBC's.   Leukocytosis: WBC is up to 17 from 12.8. 18 on admission. Likely reflecting ongoing inflammation.  Prolonged QT interval:  Asymptomatic.  Avoid prolonging agents as able.  Depression: No home meds noted.  Mood is normal at this time  Essential hypertension: Blood pressures remain elevated despite IV labetalol and IV hydralazine. Will restart Cozaar with hydralazine as at home. Patient states that she thinks that she can tolerate it.  Hypocalcemia: Resolved. Monitor and supplement as necessary.  Hypokalemia: Monitor and supplement as necessary.  Hypomagnesemia: Monitor and supplement as necessary.  Tobacco use: Offered nicotine patch if needed  I have seen and examined this patient myself. I have spent 35 minutes in her evaluation and care.  DVT prophylaxis: Lovenox Code Status: Full Family Communication: None present. Disposition Plan: Home Status is: Inpatient  Remains inpatient appropriate because:Persistent severe electrolyte disturbances. Ongoing active pain requiring inpatient pain management, Unsafe d/c plan, IV  treatments appropriate due to intensity of illness or inability to take PO and Inpatient level of care appropriate due to severity of illness  Dispo: The patient is from: Home  Anticipated d/c is to: Home  Anticipated d/c date is: > 2 days  Patient currently is not medically stable to d/c.  Jermani Eberlein, DO [facility] Direct contact: see [email or web address]  7PM-7AM contact night coverage as above  08/25/2020, 5:58 PM  LOS: 3 days

## 2020-08-25 NOTE — Progress Notes (Signed)
Per Robin Guerrero in ultrasound, keep pt NPO at [facility] for ultrasound tomorrow. Will educate pt on change in diet status and reason.

## 2020-08-25 NOTE — Plan of Care (Signed)

## 2020-08-26 ENCOUNTER — Inpatient Hospital Stay (HOSPITAL_COMMUNITY): Payer: Medicaid Other

## 2020-08-26 LAB — COMPREHENSIVE METABOLIC PANEL
ALT: 30 U/L (ref 0–44)
AST: 44 U/L — ABNORMAL HIGH (ref 15–41)
Albumin: 2.6 g/dL — ABNORMAL LOW (ref 3.5–5.0)
Alkaline Phosphatase: 66 U/L (ref 38–126)
Anion gap: 13 (ref 5–15)
BUN: 6 mg/dL (ref 6–20)
CO2: 25 mmol/L (ref 22–32)
Calcium: 8.5 mg/dL — ABNORMAL LOW (ref 8.9–10.3)
Chloride: 99 mmol/L (ref 98–111)
Creatinine, Ser: 0.68 mg/dL (ref 0.44–1.00)
GFR calc Af Amer: >60 mL/min (ref >60)
GFR calc non Af Amer: >60 mL/min (ref >60)
Glucose, Bld: 83 mg/dL (ref 70–99)
Potassium: 3 mmol/L — ABNORMAL LOW (ref 3.5–5.1)
Sodium: 137 mmol/L (ref 135–145)
Total Bilirubin: 1.6 mg/dL — ABNORMAL HIGH (ref 0.3–1.2)
Total Protein: 6.5 g/dL (ref 6.5–8.1)

## 2020-08-26 MED ORDER — DICYCLOMINE HCL 10 MG PO CAPS
10.0000 mg | ORAL_CAPSULE | Freq: Three times a day (TID) | ORAL | Status: DC | PRN
  Administered 2020-08-26: 10 mg via ORAL
  Filled 2020-08-26: qty 1

## 2020-08-26 MED ORDER — POTASSIUM CHLORIDE 10 MEQ/100ML IV SOLN
10.0000 meq | INTRAVENOUS | Status: AC
  Administered 2020-08-26 (×5): 10 meq via INTRAVENOUS
  Filled 2020-08-26 (×5): qty 100

## 2020-08-26 MED ORDER — LABETALOL HCL 5 MG/ML IV SOLN: 10.0000 mg | INTRAVENOUS | Status: DC | PRN

## 2020-08-26 MED ORDER — SIMETHICONE 80 MG PO CHEW
80.0000 mg | CHEWABLE_TABLET | Freq: Four times a day (QID) | ORAL | Status: DC
  Administered 2020-08-26 – 2020-08-27 (×6): 80 mg via ORAL
  Filled 2020-08-26 (×6): qty 1

## 2020-08-26 MED ORDER — GABAPENTIN 100 MG PO CAPS
100.0000 mg | ORAL_CAPSULE | Freq: Three times a day (TID) | ORAL | Status: DC
  Administered 2020-08-26 – 2020-08-27 (×5): 100 mg via ORAL
  Filled 2020-08-26 (×5): qty 1

## 2020-08-26 MED ORDER — METOPROLOL TARTRATE 50 MG PO TABS
50.0000 mg | ORAL_TABLET | Freq: Two times a day (BID) | ORAL | Status: DC
  Administered 2020-08-26 – 2020-08-27 (×2): 50 mg via ORAL
  Filled 2020-08-26 (×2): qty 1

## 2020-08-26 NOTE — Plan of Care (Signed)
  Problem: Clinical Measurements: Goal: Will remain free from infection Outcome: Progressing   Problem: Nutrition: Goal: Adequate nutrition will be maintained Outcome: Progressing   Problem: Coping: Goal: Level of anxiety will decrease Outcome: Progressing   Problem: Pain Managment: Goal: General experience of comfort will improve Outcome: Progressing   Problem: Education: Goal: Knowledge of General Education information will improve Description: Including pain rating scale, medication(s)/side effects and non-pharmacologic comfort measures Outcome: Completed/Met

## 2020-08-26 NOTE — Progress Notes (Signed)
Pt ambulated in hall x1.  Did get slightly SOB and HR up in the 150's.  HR decreased to 110-120 when resting and SOB subsided.  Had BP/heart meds at 0932. Andre Lefort

## 2020-08-26 NOTE — Plan of Care (Signed)
  Problem: Health Behavior/Discharge Planning: Goal: Ability to manage health-related needs will improve Outcome: Progressing   Problem: Clinical Measurements: Goal: Ability to maintain clinical measurements within normal limits will improve Outcome: Progressing Goal: Will remain free from infection Outcome: Progressing Goal: Diagnostic test results will improve Outcome: Progressing Goal: Respiratory complications will improve Outcome: Progressing Goal: Cardiovascular complication will be avoided Outcome: Progressing   Problem: Nutrition: Goal: Adequate nutrition will be maintained Outcome: Progressing   Problem: Coping: Goal: Level of anxiety will decrease Outcome: Progressing   Problem: Elimination: Goal: Will not experience complications related to bowel motility Outcome: Progressing Goal: Will not experience complications related to urinary retention Outcome: Progressing   Problem: Pain Managment: Goal: General experience of comfort will improve Outcome: Progressing   Problem: Safety: Goal: Ability to remain free from injury will improve Outcome: Progressing

## 2020-08-26 NOTE — Progress Notes (Signed)
[facility] Progress Note  Patient: Robin Guerrero    IBB:[id number]  DOA: 08/18/2020     Date of Service: the patient was seen and examined on 08/26/2020  Brief hospital course: Past medical history of hypertension, anemia, alcohol abuse.  Presents with complaints of abdominal pain found to have alcohol pancreatitis. Currently plan is continue advancement of diet.  Assessment and Plan: 1.  Acute alcoholic pancreatitis. CT abdomen ultrasound negative for any gallbladder issues. LFTs improving. Lipase almost normal. Abdominal pain still present throughout the day as well as worsens with food as the patient is not able to tolerate large volume of food. Patient was initially transitioned to heart healthy diet and then back to clear liquid diet. Currently tolerating full liquid diet therefore will advance to soft diet. Currently continuing pain control.  Can discontinue IV fluids. Monitor renal function and LFT.  2.  SIRS. Due to pancreatitis. Still has leukocytosis. Monitor.  3.  Ileus In the setting of pancreatitis. Currently improving.  Monitor.  4.  Severe thrombocytopenia Acute. Likely in response to alcohol abuse as well as pancreatitis. Also component of hemodilution. Monitor.  5.  Hypokalemia Replacing IV as the patient is unable to tolerate p.o.  6.  Anemia Dilutional. No acute bleeding. Monitor.  7.  Hyperbilirubinemia. In the setting of SIRS. Monitor.  8.. Prolonged QT interval Monitor.  9.  Accelerated hypertension with sinus tachycardia. Lopressor dose increased. Monitor. Continue home regimen.  10.  Hypocalcemia.  Hypomagnesemia Currently resolved.  Monitor.  11.  Topical abuse. Nicotine patch.  12.  Alcohol abuse. Refusing any alcohol rehabilitation for now. At risk for recurrent presentation  13 obesity Body mass index is 38.01 kg/m.  Dietary consultation recommended.  Diet: Soft diet DVT Prophylaxis: Place and maintain  sequential compression device Start: 08/21/20 0734  Due to thrombocytopenia   Advance goals of care discussion: Full code  Family Communication: no family was present at bedside, at the time of interview.   Disposition:  Status is: Inpatient  Remains inpatient appropriate because:IV treatments appropriate due to intensity of illness or inability to take PO   Dispo: The patient is from: Home              Anticipated d/c is to: Home              Anticipated d/c date is: 2 days              Patient currently is not medically stable to d/c.  Subjective: Frustrated that she is not able to eat regular food. Reports abdominal pain after eating and early satiety. No nausea no vomiting.  Physical Exam:  General: Appear in mild distress, no Rash; Oral Mucosa Clear, moist. no Abnormal Neck Mass Or lumps, Conjunctiva normal  Cardiovascular: S1 and S2 Present, no Murmur, Respiratory: good respiratory effort, Bilateral Air entry present and CTA, no Crackles, no wheezes Abdomen: Bowel Sound present, Soft and mild tenderness Extremities: no Pedal edema Neurology: alert and oriented to time, place, and person affect appropriate. no new focal deficit Gait not checked due to patient safety concerns  Vitals:   08/26/20 0533 08/26/20 0805 08/26/20 1415 08/26/20 1915  BP: (!) 158/114 (!) 153/98 (!) 156/110 (!) 159/113  Pulse: (!) 113 (!) 121 (!) 110 (!) 118  Resp: 20 16 16    Temp: 99.5 F (37.5 C) 98.7 F (37.1 C) 99 F (37.2 C)   TempSrc: Oral Oral Oral   SpO2: 98% 99% 99%   Weight:  Height:        Intake/Output Summary (Last 24 hours) at 08/26/2020 1930 Last data filed at 08/26/2020 1836 Gross per 24 hour  Intake 1635.25 ml  Output --  Net 1635.25 ml   Filed Weights   08/18/20 1109  Weight: 113.4 kg    Data Reviewed: I have personally reviewed and interpreted daily labs, tele strips, imagings as discussed above. I reviewed all nursing notes, pharmacy notes, vitals,  pertinent old records I have discussed plan of care as described above with RN and patient/family.  CBC: Recent Labs  Lab 08/21/20 0541 08/21/20 0541 08/22/20 0350 08/22/20 1947 08/23/20 0407 08/24/20 0404 08/25/20 0330  WBC 13.0*  --  12.8*  --  15.3* 15.7* 17.0*  NEUTROABS 10.3*  --  10.0*  --  11.5* 11.8* 12.9*  HGB 8.0*   < > 6.6* 7.7* 7.6* 7.5* 7.4*  HCT 25.5*   < > 21.5* 24.0* 23.8* 23.6* 23.6*  MCV 83.9  --  86.0  --  86.2 86.1 86.8  PLT 62*  --  55*  --  67* 90* 128*   < > = values in this interval not displayed.   Basic Metabolic Panel: Recent Labs  Lab 08/21/20 0541 08/21/20 0541 08/21/20 1706 08/21/20 1706 08/22/20 0350 08/22/20 1947 08/23/20 0407 08/23/20 1812 08/24/20 0404 08/25/20 0330 08/26/20 0350  NA 137   < > 137   < > 136   < > 136 134* 134* 133* 137  K 3.2*   < > 2.9*   < > 2.9*   < > 3.0* 3.0* 2.8* 3.0* 3.0*  CL 103   < > 106   < > 106   < > 105 100 101 96* 99  CO2 21*   < > 22   < > 22   < > 22 21* 24 24 25   GLUCOSE 122*   < > 121*   < > 127*   < > 92 104* 100* 119* 83  BUN 7   < > 5*   < > 6   < > 6 6 7 7 6   CREATININE 0.77   < > 0.81   < > 0.68   < > 0.69 0.83 0.80 0.75 0.68  CALCIUM 6.8*   < > 7.1*   < > 7.5*   < > 8.2* 8.6* 8.3* 8.4* 8.5*  MG 2.1  --  1.9  --  1.9  --  2.0  --  1.7  --   --   PHOS 2.0*  --  2.1*  --  2.9  --  3.6  --  3.8  --   --    < > = values in this interval not displayed.    Studies: US Abdomen Limited RUQ  Result Date: 08/26/2020 CLINICAL DATA:  Initial evaluation for hyperbilirubinemia. EXAM: ULTRASOUND ABDOMEN LIMITED RIGHT UPPER QUADRANT COMPARISON:  Prior CT from 08/21/2020. FINDINGS: Gallbladder: No gallstones or wall thickening visualized. No sonographic Murphy sign noted by sonographer. Common bile duct: Diameter: 6 mm Liver: No focal lesion identified. Increased echogenicity within the hepatic parenchyma. Portal vein is patent on color Doppler imaging with normal direction of blood flow towards the liver.  Other: None. IMPRESSION: 1. Normal sonographic appearance of the gallbladder. No biliary dilatation. 2. Increased echogenicity within the hepatic parenchyma, most commonly seen with fatty infiltration. Electronically Signed   By: Jeannine Boga M.D.   On: 08/26/2020 08:08    Scheduled Meds: . amLODipine  5 mg  Oral Daily  . Chlorhexidine Gluconate Cloth  6 each Topical Daily  . folic acid  1 mg Oral Daily  . gabapentin  100 mg Oral TID  . losartan  100 mg Oral Daily  . metoprolol tartrate  50 mg Oral BID  . multivitamin with minerals  1 tablet Oral Daily  . nicotine  14 mg Transdermal Daily  . pantoprazole  40 mg Oral BID  . simethicone  80 mg Oral QID  . thiamine  100 mg Oral Daily   Continuous Infusions: . potassium chloride 10 mEq (08/26/20 1736)   PRN Meds: acetaminophen **OR** acetaminophen, dicyclomine, hydrALAZINE, labetalol, morphine injection, oxyCODONE, sodium chloride flush  Time spent: 35 minutes  Author: Berle Mull, MD [facility] 08/26/2020 7:30 PM  To reach On-call, see care teams to locate the attending and reach out via [email or web address]. Between 7PM-7AM, please contact night-coverage If you still have difficulty reaching the attending provider, please page the [facility] (Director on Call) for [facility] on amion for assistance.

## 2020-08-27 LAB — COMPREHENSIVE METABOLIC PANEL
ALT: 31 U/L (ref 0–44)
AST: 38 U/L (ref 15–41)
Albumin: 2.6 g/dL — ABNORMAL LOW (ref 3.5–5.0)
Alkaline Phosphatase: 66 U/L (ref 38–126)
Anion gap: 11 (ref 5–15)
BUN: 6 mg/dL (ref 6–20)
CO2: 25 mmol/L (ref 22–32)
Calcium: 8.8 mg/dL — ABNORMAL LOW (ref 8.9–10.3)
Chloride: 103 mmol/L (ref 98–111)
Creatinine, Ser: 0.67 mg/dL (ref 0.44–1.00)
GFR calc Af Amer: >60 mL/min (ref >60)
GFR calc non Af Amer: >60 mL/min (ref >60)
Glucose, Bld: 91 mg/dL (ref 70–99)
Potassium: 3.3 mmol/L — ABNORMAL LOW (ref 3.5–5.1)
Sodium: 139 mmol/L (ref 135–145)
Total Bilirubin: 1.4 mg/dL — ABNORMAL HIGH (ref 0.3–1.2)
Total Protein: 6.8 g/dL (ref 6.5–8.1)

## 2020-08-27 LAB — RETICULOCYTES
Immature Retic Fract: 41.4 % — ABNORMAL HIGH (ref 2.3–15.9)
RBC.: 2.51 MIL/uL — ABNORMAL LOW (ref 3.87–5.11)
Retic Count, Absolute: 103.4 10*3/uL (ref 19.0–186.0)
Retic Ct Pct: 4.1 % — ABNORMAL HIGH (ref 0.4–3.1)

## 2020-08-27 LAB — CBC WITH DIFFERENTIAL/PLATELET
Abs Immature Granulocytes: 0.96 10*3/uL — ABNORMAL HIGH (ref 0.00–0.07)
Abs Immature Granulocytes: 0.88 10*3/uL — ABNORMAL HIGH (ref 0.00–0.07)
Basophils Absolute: 0.1 10*3/uL (ref 0.0–0.1)
Basophils Absolute: 0.1 10*3/uL (ref 0.0–0.1)
Basophils Relative: 0 %
Basophils Relative: 0 %
Eosinophils Absolute: 0.2 10*3/uL (ref 0.0–0.5)
Eosinophils Absolute: 0.2 10*3/uL (ref 0.0–0.5)
Eosinophils Relative: 1 %
Eosinophils Relative: 1 %
HCT: 22.3 % — ABNORMAL LOW (ref 36.0–46.0)
HCT: 22.1 % — ABNORMAL LOW (ref 36.0–46.0)
Hemoglobin: 6.9 g/dL — CL (ref 12.0–15.0)
Hemoglobin: 6.7 g/dL — CL (ref 12.0–15.0)
Immature Granulocytes: 5 %
Immature Granulocytes: 5 %
Lymphocytes Relative: 6 %
Lymphocytes Relative: 6 %
Lymphs Abs: 1.2 10*3/uL (ref 0.7–4.0)
Lymphs Abs: 1.2 10*3/uL (ref 0.7–4.0)
MCH: 27.2 pg (ref 26.0–34.0)
MCH: 26.8 pg (ref 26.0–34.0)
MCHC: 30.9 g/dL (ref 30.0–36.0)
MCHC: 30.3 g/dL (ref 30.0–36.0)
MCV: 87.8 fL (ref 80.0–100.0)
MCV: 88.4 fL (ref 80.0–100.0)
Monocytes Absolute: 1.3 10*3/uL — ABNORMAL HIGH (ref 0.1–1.0)
Monocytes Absolute: 1.1 10*3/uL — ABNORMAL HIGH (ref 0.1–1.0)
Monocytes Relative: 6 %
Monocytes Relative: 6 %
Neutro Abs: 16 10*3/uL — ABNORMAL HIGH (ref 1.7–7.7)
Neutro Abs: 15.7 10*3/uL — ABNORMAL HIGH (ref 1.7–7.7)
Neutrophils Relative %: 82 %
Neutrophils Relative %: 82 %
Platelets: 213 10*3/uL (ref 150–400)
Platelets: 216 10*3/uL (ref 150–400)
RBC: 2.54 MIL/uL — ABNORMAL LOW (ref 3.87–5.11)
RBC: 2.5 MIL/uL — ABNORMAL LOW (ref 3.87–5.11)
RDW: 20.6 % — ABNORMAL HIGH (ref 11.5–15.5)
RDW: 20.5 % — ABNORMAL HIGH (ref 11.5–15.5)
WBC: 19.7 10*3/uL — ABNORMAL HIGH (ref 4.0–10.5)
WBC: 19.1 10*3/uL — ABNORMAL HIGH (ref 4.0–10.5)
nRBC: 0.4 % — ABNORMAL HIGH (ref 0.0–0.2)
nRBC: 0.5 % — ABNORMAL HIGH (ref 0.0–0.2)

## 2020-08-27 LAB — DIRECT ANTIGLOBULIN TEST (NOT AT ARMC)
DAT, IgG: NEGATIVE
DAT, complement: NEGATIVE

## 2020-08-27 LAB — T4, FREE: Free T4: 1.35 ng/dL — ABNORMAL HIGH (ref 0.61–1.12)

## 2020-08-27 LAB — HEMOGLOBIN AND HEMATOCRIT, BLOOD
HCT: 25.3 % — ABNORMAL LOW (ref 36.0–46.0)
Hemoglobin: 7.8 g/dL — ABNORMAL LOW (ref 12.0–15.0)

## 2020-08-27 LAB — PROTIME-INR
INR: 1.1 (ref 0.8–1.2)
Prothrombin Time: 14 seconds (ref 11.4–15.2)

## 2020-08-27 LAB — FIBRINOGEN: Fibrinogen: 543 mg/dL — ABNORMAL HIGH (ref 210–475)

## 2020-08-27 LAB — LACTATE DEHYDROGENASE: LDH: 371 U/L — ABNORMAL HIGH (ref 98–192)

## 2020-08-27 LAB — TSH: TSH: 0.974 u[IU]/mL (ref 0.350–4.500)

## 2020-08-27 LAB — VITAMIN B12: Vitamin B-12: 620 pg/mL (ref 180–914)

## 2020-08-27 LAB — FOLATE: Folate: 11.8 ng/mL (ref >5.9)

## 2020-08-27 LAB — PREPARE RBC (CROSSMATCH)

## 2020-08-27 MED ORDER — HYDROCODONE-ACETAMINOPHEN 5-325 MG PO TABS
2.0000 | ORAL_TABLET | Freq: Three times a day (TID) | ORAL | 0 refills | Status: DC | PRN
Start: 2020-08-27 — End: 2020-11-18

## 2020-08-27 MED ORDER — DICYCLOMINE HCL 10 MG PO CAPS: 10.0000 mg | ORAL_CAPSULE | Freq: Three times a day (TID) | ORAL | 0 refills | Status: DC | PRN

## 2020-08-27 MED ORDER — NICOTINE 14 MG/24HR TD PT24: 14.0000 mg | MEDICATED_PATCH | Freq: Every day | TRANSDERMAL | 0 refills | Status: DC

## 2020-08-27 MED ORDER — LOSARTAN POTASSIUM 100 MG PO TABS
100.0000 mg | ORAL_TABLET | Freq: Every day | ORAL | 0 refills | Status: DC
Start: 2020-08-28 — End: 2020-11-17

## 2020-08-27 MED ORDER — SODIUM CHLORIDE 0.9 % IV SOLN
INTRAVENOUS | Status: DC | PRN
  Administered 2020-08-27: 250 mL via INTRAVENOUS

## 2020-08-27 MED ORDER — POTASSIUM CHLORIDE 10 MEQ/100ML IV SOLN
10.0000 meq | INTRAVENOUS | Status: AC
  Administered 2020-08-27 (×4): 10 meq via INTRAVENOUS
  Filled 2020-08-27 (×4): qty 100

## 2020-08-27 MED ORDER — FOLIC ACID 1 MG PO TABS: 1.0000 mg | ORAL_TABLET | Freq: Every day | ORAL | 0 refills | Status: DC

## 2020-08-27 MED ORDER — SIMETHICONE 80 MG PO CHEW: 80.0000 mg | CHEWABLE_TABLET | Freq: Four times a day (QID) | ORAL | 0 refills | Status: DC | PRN

## 2020-08-27 MED ORDER — PANTOPRAZOLE SODIUM 40 MG PO TBEC
40.0000 mg | DELAYED_RELEASE_TABLET | Freq: Every day | ORAL | 0 refills | Status: DC
Start: 2020-08-27 — End: 2020-11-18

## 2020-08-27 MED ORDER — SODIUM CHLORIDE 0.9% IV SOLUTION: Freq: Once | INTRAVENOUS | Status: AC

## 2020-08-27 MED ORDER — METOPROLOL TARTRATE 75 MG PO TABS: 50.0000 mg | ORAL_TABLET | Freq: Two times a day (BID) | ORAL | 0 refills | Status: DC

## 2020-08-27 MED ORDER — GABAPENTIN 100 MG PO CAPS: 100.0000 mg | ORAL_CAPSULE | Freq: Three times a day (TID) | ORAL | 0 refills | Status: DC

## 2020-08-27 MED ORDER — CLONIDINE HCL 0.1 MG PO TABS
0.1000 mg | ORAL_TABLET | Freq: Every day | ORAL | Status: DC
  Administered 2020-08-27: 0.1 mg via ORAL
  Filled 2020-08-27: qty 1

## 2020-08-27 MED ORDER — DOCUSATE SODIUM 100 MG PO CAPS: 100.0000 mg | ORAL_CAPSULE | Freq: Every day | ORAL | 0 refills | Status: DC | PRN

## 2020-08-27 MED ORDER — THIAMINE HCL 100 MG PO TABS: 100.0000 mg | ORAL_TABLET | Freq: Every day | ORAL | 0 refills | Status: DC

## 2020-08-27 NOTE — Progress Notes (Signed)
CRITICAL VALUE ALERT  Critical Value:  Hgb 6.7  Date & Time Notied:  08/27/2020 1049  Provider Notified: Dr. Posey Pronto  Orders Received/Actions taken: Awaiting further instructions/orders.

## 2020-08-27 NOTE — Progress Notes (Signed)
Pt discharged home today per Dr. Posey Pronto. Pt's IV site D/C'd and WDL. Pt's VSS. Pt provided with home medication list, discharge instructions and prescriptions. Verbalized understanding. Pt left floor via WC in stable condition accompanied by NT.

## 2020-08-27 NOTE — Plan of Care (Signed)
  Problem: Health Behavior/Discharge Planning: Goal: Ability to manage health-related needs will improve Outcome: Completed/Met   Problem: Clinical Measurements: Goal: Ability to maintain clinical measurements within normal limits will improve Outcome: Completed/Met Goal: Will remain free from infection Outcome: Completed/Met Goal: Diagnostic test results will improve Outcome: Completed/Met Goal: Respiratory complications will improve Outcome: Completed/Met Goal: Cardiovascular complication will be avoided Outcome: Completed/Met   Problem: Nutrition: Goal: Adequate nutrition will be maintained Outcome: Completed/Met   Problem: Coping: Goal: Level of anxiety will decrease Outcome: Completed/Met   Problem: Elimination: Goal: Will not experience complications related to bowel motility Outcome: Completed/Met Goal: Will not experience complications related to urinary retention Outcome: Completed/Met   Problem: Pain Managment: Goal: General experience of comfort will improve Outcome: Completed/Met   Problem: Safety: Goal: Ability to remain free from injury will improve Outcome: Completed/Met

## 2020-08-27 NOTE — Progress Notes (Signed)
CRITICAL VALUE ALERT  Critical Value:  hgb 6.9  Date & Time Notied:  08/27/20  Provider Notified: Kennon Holter, NP  Orders Received/Actions taken: Awaiting new orders.

## 2020-08-27 NOTE — Discharge Instructions (Signed)
Acute Pancreatitis  The pancreas is a gland that is located behind the stomach on the left side of the abdomen. It produces enzymes that help to digest food. The pancreas also releases the hormones glucagon and insulin, which help to regulate blood sugar. Acute pancreatitis happens when inflammation of the pancreas suddenly occurs and the pancreas becomes irritated and swollen. Most acute attacks last a few days and cause serious problems. Some people become dehydrated and develop low blood pressure. In severe cases, bleeding in the abdomen can lead to shock and can be life-threatening. The lungs, heart, and kidneys may fail. What are the causes? This condition may be caused by:  Alcohol abuse.  Drug abuse.  Gallstones or other conditions that can block the tube that drains the pancreas (pancreatic duct).  A tumor in the pancreas. Other causes include:  Certain medicines.  Exposure to certain chemicals.  Diabetes.  An infection in the pancreas.  Damage caused by an accident (trauma).  The poison (venom) from a scorpion bite.  Abdominal surgery.  Autoimmune pancreatitis. This is when the body's disease-fighting (immune) system attacks the pancreas.  Genes that are passed from parent to child (inherited). In some cases, the cause of this condition is not known. What are the signs or symptoms? Symptoms of this condition include:  Pain in the upper abdomen that may radiate to the back. Pain may be severe.  Tenderness and swelling of the abdomen.  Nausea and vomiting.  Fever. How is this diagnosed? This condition may be diagnosed based on:  A physical exam.  Blood tests.  Imaging tests, such as X-rays, CT or MRI scans, or an ultrasound of the abdomen. How is this treated? Treatment for this condition usually requires a stay in the hospital. Treatment for this condition may include:  Pain medicine.  Fluid replacement through an IV.  Placing a tube in the stomach  to remove stomach contents and to control vomiting (NG tube, or nasogastric tube).  Not eating for 3-4 days. This gives the pancreas a rest, because enzymes are not being produced that can cause further damage.  Antibiotic medicines, if your condition is caused by an infection.  Treating any underlying conditions that may be the cause.  Steroid medicines, if your condition is caused by your immune system attacking your body's own tissues (autoimmune disease).  Surgery on the pancreas or gallbladder. Follow these instructions at home: Eating and drinking   Follow instructions from your health care provider about diet. This may involve avoiding alcohol and decreasing the amount of fat in your diet.  Eat smaller, more frequent meals. This reduces the amount of digestive fluids that the pancreas produces.  Drink enough fluid to keep your urine pale yellow.  Do not drink alcohol if it caused your condition. General instructions  Take over-the-counter and prescription medicines only as told by your health care provider.  Do not drive or use heavy machinery while taking prescription pain medicine.  Ask your health care provider if the medicine prescribed to you can cause constipation. You may need to take steps to prevent or treat constipation, such as: ? Take an over-the-counter or prescription medicine for constipation. ? Eat foods that are high in fiber such as whole grains and beans. ? Limit foods that are high in fat and processed sugars, such as fried or sweet foods.  Do not use any products that contain nicotine or tobacco, such as cigarettes, e-cigarettes, and chewing tobacco. If you need help quitting, ask your   health care provider.  Get plenty of rest.  If directed, check your blood sugar at home as told by your health care provider.  Keep all follow-up visits as told by your health care provider. This is important. Contact a health care provider if you:  Do not recover  as quickly as expected.  Develop new or worsening symptoms.  Have persistent pain, weakness, or nausea.  Recover and then have another episode of pain.  Have a fever. Get help right away if:  You cannot eat or keep fluids down.  Your pain becomes severe.  Your skin or the white part of your eyes turns yellow (jaundice).  You have sudden swelling in your abdomen.  You vomit.  You feel dizzy or you faint.  Your blood sugar is high (over 300 mg/dL). Summary  Acute pancreatitis happens when inflammation of the pancreas suddenly occurs and the pancreas becomes irritated and swollen.  This condition is typically caused by alcohol abuse, drug abuse, or gallstones.  Treatment for this condition usually requires a stay in the hospital. This information is not intended to replace advice given to you by your health care provider. Make sure you discuss any questions you have with your health care provider. Alcohol Abuse and Dependence Information, Adult Alcohol is a widely available drug. People drink alcohol in different amounts. People who drink alcohol very often and in large amounts often have problems during and after drinking. They may develop what is called an alcohol use disorder. There are two main types of alcohol use disorders:  Alcohol abuse. This is when you use alcohol too much or too often. You may use alcohol to make yourself feel happy or to reduce stress. You may have a hard time setting a limit on the amount you drink.  Alcohol dependence. This is when you use alcohol consistently for a period of time, and your body changes as a result. This can make it hard to stop drinking because you may start to feel sick or feel different when you do not use alcohol. These symptoms are known as withdrawal. How can alcohol abuse and dependence affect me? Alcohol abuse and dependence can have a negative effect on your life. Drinking too much can lead to addiction. You may feel like  you need alcohol to function normally. You may drink alcohol before work in the morning, during the day, or as soon as you get home from work in the evening. These actions can result in:  Poor work performance.  Job loss.  Financial problems.  Car crashes or criminal charges from driving after drinking alcohol.  Problems in your relationships with friends and family.  Losing the trust and respect of coworkers, friends, and family. Drinking heavily over a long period of time can permanently damage your body and brain, and can cause lifelong health issues, such as:  Damage to your liver or pancreas.  Heart problems, high blood pressure, or stroke.  Certain cancers.  Decreased ability to fight infections.  Brain or nerve damage.  Depression.  Early (premature) death. If you are careless or you crave alcohol, it is easy to drink more than your body can handle (overdose). Alcohol overdose is a serious situation that requires hospitalization. It may lead to permanent injuries or death. What can increase my risk?  Having a family history of alcohol abuse.  Having depression or other mental health conditions.  Beginning to drink at an early age.  Binge drinking often.  Experiencing trauma, stress, and  an unstable home life during childhood.  Spending time with people who drink often. What actions can I take to prevent or manage alcohol abuse and dependence?  Do not drink alcohol if: ? Your health care provider tells you not to drink. ? You are pregnant, may be pregnant, or are planning to become pregnant.  If you drink alcohol: ? Limit how much you use to:  0-1 drink a day for women.  0-2 drinks a day for men. ? Be aware of how much alcohol is in your drink. In the [place] one drink equals one 12 oz bottle of beer (355 mL), one 5 oz glass of wine (148 mL), or one 1 oz glass of hard liquor (44 mL).  Stop drinking if you have been drinking too much. This can be very hard  to do if you are used to abusing alcohol. If you begin to have withdrawal symptoms, talk with your health care provider or a person that you trust. These symptoms may include anxiety, shaky hands, headache, nausea, sweating, or not being able to sleep.  Choose to drink nonalcoholic beverages in social gatherings and places where there may be alcohol. Activity  Spend more time on activities that you enjoy that do not involve alcohol, like hobbies or exercise.  Find healthy ways to cope with stress, such as exercise, meditation, or spending time with people you care about. General information  Talk to your family, coworkers, and friends about supporting you in your efforts to stop drinking. If they drink, ask them not to drink around you. Spend more time with people who do not drink alcohol.  If you think that you have an alcohol dependency problem: ? Tell friends or family about your concerns. ? Talk with your health care provider or another health professional about where to get help. ? Work with a Transport planner and a Regulatory affairs officer. ? Consider joining a support group for people who struggle with alcohol abuse and dependence. Where to find support   Your health care provider.  SMART Recovery: www.smartrecovery.org Therapy and support groups  Local treatment centers or chemical dependency counselors.  Local AA groups in your community: NicTax.com.pt Where to find more information  Centers for Disease Control and Prevention: http://www.wolf.info/  National Institute on Alcohol Abuse and Alcoholism: http://www.bradshaw.com/  Alcoholics Anonymous (AA): [employer]Tax.com.pt Contact a health care provider if:  You drank more or for longer than you intended on more than one occasion.  You tried to stop drinking or to cut back on how much you drink, but you were not able to.  You often drink to the point of vomiting or passing out.  You want to drink so badly that you cannot think about anything  else.  You have problems in your life due to drinking, but you continue to drink.  You keep drinking even though you feel anxious, depressed, or have experienced memory loss.  You have stopped doing the things you used to enjoy in order to drink.  You have to drink more than you used to in order to get the effect you want.  You experience anxiety, sweating, nausea, shakiness, and trouble sleeping when you try to stop drinking. Get help right away if:  You have thoughts about hurting yourself or others.  You have serious withdrawal symptoms, including: ? Confusion. ? Racing heart. ? High blood pressure. ? Fever. If you ever feel like you may hurt yourself or others, or have thoughts about taking your own life, get  help right away. You can go to your nearest emergency department or call:  Your local emergency services (911 in the [place]  A suicide crisis helpline, such as the [facility] at (424) 147-8088. This is open 24 hours a day. Summary  Alcohol abuse and dependence can have a negative effect on your life. Drinking too much or too often can lead to addiction.  If you drink alcohol, limit how much you use.  If you are having trouble keeping your drinking under control, find ways to change your behavior. Hobbies, calming activities, exercise, or support groups can help.  If you feel you need help with changing your drinking habits, talk with your health care provider, a good friend, or a therapist, or go to an [place] group. This information is not intended to replace advice given to you by your health care provider. Make sure you discuss any questions you have with your health care provider. Document Revised: 03/23/2019 Document Reviewed: 02/09/2019 Elsevier Patient Education  [place] Revised: 09/21/2018 Document Reviewed: 06/08/2018 Elsevier Patient Education  2020 [employer].

## 2020-08-28 LAB — TYPE AND SCREEN
ABO/RH(D): A POS
Antibody Screen: NEGATIVE
Unit division: 0

## 2020-08-28 LAB — BPAM RBC
Blood Product Expiration Date: 202110032359
ISSUE DATE / TIME: 202109121356
Unit Type and Rh: 6200

## 2020-08-28 LAB — HAPTOGLOBIN: Haptoglobin: 261 mg/dL (ref 33–278)

## 2020-08-29 NOTE — Discharge Summary (Signed)
[facility] Discharge Summary   Patient: Robin Guerrero TKP:[id number]  PCP: Trey Sailors, PA  Date of admission: 08/18/2020   Date of discharge: 08/27/2020      Discharge Diagnoses:   Principal Problem:   Acute alcoholic pancreatitis Active Problems:   SIRS (systemic inflammatory response syndrome) (HCC)   Hypokalemia   Hypomagnesemia   Prolonged QT interval   Essential hypertension   Depression   Tobacco use   Hypocalcemia   Thrombocytopenia (HCC)   Normocytic anemia  Admitted From: home Disposition:  Home   Recommendations for Outpatient Follow-up:  1. PCP: please follow up with PCP in 1 week 2. Follow up LABS/TEST:  none   Follow-up Information    Trey Sailors, PA. Schedule an appointment as soon as possible for a visit in 1 week(s).   Specialty: Physician Assistant Contact information: [place] [phone]              Diet recommendation: Cardiac diet  Activity: The patient is advised to gradually reintroduce usual activities, as tolerated  Discharge Condition: stable  Code Status: Full code   History of present illness: As per the H and P dictated on admission, "This is a 34 year old female with past medical history of iron deficiency anemia, hypertension, alcohol abuse, depression who presented to the ED with abdominal pain, nausea and vomiting x1 day.  States that yesterday morning she began having lower abdominal pain with radiation to her back described as bloating which became more severe and persistent throughout the day and included nausea and vomiting.  She has been unable to keep any food down but has been able to tolerate liquids.  Reports that over the past year she has been drinking 1 to 2 cups of vodka nightly since her mother's death last year from breast cancer.  She is increased her alcohol intake to 2 to 3 cups of vodka nightly over the past 1 month.  Last drink was Wednesday evening.  Denies  any history of alcohol withdrawal symptoms.  States that she is very interested in quitting and has recently signed up for therapy which she is not gone to yet.  Also smokes about 6 to 10 cigarettes daily and is also interested in quitting.  She is requesting to advance her diet to clear liquids."  Hospital Course:  Summary of her active problems in the hospital is as following. 1.  Acute alcoholic pancreatitis. CT abdomen ultrasound negative for any gallbladder issues. LFTs improving. Lipase almost normal. Abdominal pain still present throughout the day as well as worsens with food as the patient is not able to tolerate large volume of food. Patient was initially transitioned to heart healthy diet and then back to clear liquid diet. Currently tolerating full liquid diet therefore will advance to soft diet. Currently continuing pain control.  Can discontinue IV fluids. Monitor renal function and LFT.  2.  SIRS. Due to pancreatitis. Still has leukocytosis. Monitor outpatient.  3.  Ileus In the setting of pancreatitis. Currently improving.  Monitor.  4.  Severe thrombocytopenia Acute. Likely in response to alcohol abuse as well as pancreatitis. Also component of hemodilution. Monitor.  5.  Hypokalemia Replacing IV as the patient is unable to tolerate p.o.  6.  Anemia Dilutional. No acute bleeding. Monitor.  7.  Hyperbilirubinemia. In the setting of SIRS. Monitor.  8.. Prolonged QT interval Resolved  Monitor.  9.  Accelerated hypertension with sinus tachycardia. Lopressor dose increased. Monitor. Continue home regimen.  10.  Hypocalcemia.  Hypomagnesemia Currently resolved.  Monitor.  11.  Topical abuse. Nicotine patch.  12.  Alcohol abuse. Refusing any alcohol rehabilitation for now. At risk for recurrent presentation  13 obesity Body mass index is 38.01 kg/m.  Dietary consultation recommended.  Pain control  - [employer] Controlled  Substance Reporting System database was reviewed. - 5 day supply was provided. - Patient was instructed, not to drive, operate heavy machinery, perform activities at heights, swimming or participation in water activities or provide baby sitting services while on Pain, Sleep and Anxiety Medications; until her outpatient Physician has advised to do so again.  - Also recommended to not to take more than prescribed Pain, Sleep and Anxiety Medications.  Patient was ambulatory without any assistance. On the day of the discharge the patient's vitals were stable, and no other acute medical condition were reported by patient. the patient was felt safe to be discharge at Home with no therapy needed on discharge.  Consultants: none Procedures: none  Discharge Exam: General: Appear in no distress, no Rash; Oral Mucosa Clear, moist. Cardiovascular: S1 and S2 Present, no Murmur, Respiratory: normal respiratory effort, Bilateral Air entry present and no Crackles, no wheezes Abdomen: Bowel Sound present, Soft and no tenderness, no hernia Extremities: no Pedal edema, no calf tenderness Neurology: alert and oriented to time, place, and person affect appropriate.  Filed Weights   08/18/20 1109  Weight: 113.4 kg   Vitals:   08/27/20 1419 08/27/20 1645  BP: (!) 154/101 (!) 164/119  Pulse: (!) 105 (!) 106  Resp: 20 20  Temp: 98.4 F (36.9 C) 98.5 F (36.9 C)  SpO2: 100% 100%    DISCHARGE MEDICATION: Allergies as of 08/27/2020   No Known Allergies     Medication List    STOP taking these medications   losartan-hydrochlorothiazide 100-25 MG tablet Commonly known as: HYZAAR     TAKE these medications   amLODipine 5 MG tablet Commonly known as: NORVASC Take 5 mg by mouth daily.   dicyclomine 10 MG capsule Commonly known as: BENTYL Take 1 capsule (10 mg total) by mouth 3 (three) times daily as needed for spasms.   docusate sodium 100 MG capsule Commonly known as: Colace Take 1 capsule  (100 mg total) by mouth daily as needed.   ferrous sulfate 325 (65 FE) MG tablet Take 1 tablet (325 mg total) by mouth daily.   folic acid 1 MG tablet Commonly known as: FOLVITE Take 1 tablet (1 mg total) by mouth daily.   gabapentin 100 MG capsule Commonly known as: NEURONTIN Take 1 capsule (100 mg total) by mouth 3 (three) times daily.   HYDROcodone-acetaminophen 5-325 MG tablet Commonly known as: NORCO/VICODIN Take 2 tablets by mouth 3 (three) times daily as needed for moderate pain or severe pain. What changed:   how much to take  when to take this  reasons to take this   losartan 100 MG tablet Commonly known as: COZAAR Take 1 tablet (100 mg total) by mouth daily.   Metoprolol Tartrate 75 MG Tabs Take 50 mg by mouth 2 (two) times daily.   nicotine 14 mg/24hr patch Commonly known as: NICODERM CQ - dosed in mg/24 hours Place 1 patch (14 mg total) onto the skin daily.   pantoprazole 40 MG tablet Commonly known as: PROTONIX Take 1 tablet (40 mg total) by mouth daily.   simethicone 80 MG chewable tablet Commonly known as: MYLICON Chew 1 tablet (80 mg total) by mouth 4 (four)  times daily as needed for flatulence.   thiamine 100 MG tablet Take 1 tablet (100 mg total) by mouth daily.      No Known Allergies Discharge Instructions    Diet fat modified   Complete by: As directed    Discharge instructions   Complete by: As directed    It is important that you read the given instructions as well as go over your medication list with RN to help you understand your care after this hospitalization.  Please follow-up with PCP in 1-2 weeks.  Please note that NO REFILLS for any discharge medications will be authorized once you are discharged, as it is imperative that you return to your primary care physician (or establish a relationship with a primary care physician if you do not have one) for your aftercare needs so that they can reassess your need for medications and  monitor your lab values.  Please request your primary care physician to go over all Hospital Tests and Procedure/Radiological results at the follow up. Please get all Hospital records sent to your PCP by signing hospital release before you go home.   Do not drive, operating heavy machinery, perform activities at heights, swimming or participation in water activities or provide baby sitting services while you are on Pain, Sleep and Anxiety Medications; until you have been seen by Primary Care Physician or a Neurologist and are cleared to do such activities.  Do not take more than prescribed Pain, Sleep and Anxiety Medications.  You were cared for by a hospitalist during your hospital stay. If you have any questions about your discharge medications or the care you received while you were in the hospital after you are discharged, you can call the unit @UNIT @ you were admitted to and ask to speak with the hospitalist Berle Mull. Ask for Hospitalist on call if the hospitalist that took care of you is not available.   Once you are discharged, your primary care physician will handle any further medical issues.  You Must read complete instructions/literature along with all the possible adverse reactions/side effects for all the Medicines you take and that have been prescribed to you. Take any new Medicines after you have completely understood and accept all the possible adverse reactions/side effects.  If you have smoked or chewed Tobacco in the last 2 yrs please STOP smoking If you drink alcohol, please safely STOP the use. Do not drive, operating heavy machinery, perform activities at heights, swimming or participation in water activities or provide baby sitting services under influence.  Wear Seat belts while driving.   Increase activity slowly   Complete by: As directed       The results of significant diagnostics from this hospitalization (including imaging, microbiology, ancillary and  laboratory) are listed below for reference.    Significant Diagnostic Studies: CT ABDOMEN PELVIS W CONTRAST  Result Date: 08/21/2020 CLINICAL DATA:  Acute generalized abdominal pain. EXAM: CT ABDOMEN AND PELVIS WITH CONTRAST TECHNIQUE: Multidetector CT imaging of the abdomen and pelvis was performed using the standard protocol following bolus administration of intravenous contrast. CONTRAST:  167mL OMNIPAQUE IOHEXOL 300 MG/ML  SOLN COMPARISON:  August 18, 2020. FINDINGS: Lower chest: Bilateral pleural effusions are noted with adjacent subsegmental atelectasis, left greater than right. Hepatobiliary: No gallstones or biliary dilatation is noted. Hepatic steatosis is noted. Pancreas: Stable inflammatory changes are noted around the pancreas suggesting acute pancreatitis. There is no definite evidence of pancreatic necrosis seen at this time. No ductal dilatation is noted. Spleen:  Normal in size without focal abnormality. Adrenals/Urinary Tract: Adrenal glands are unremarkable. Kidneys are normal, without renal calculi, focal lesion, or hydronephrosis. Bladder is unremarkable. Stomach/Bowel: The stomach appears normal. There is no evidence of bowel obstruction or inflammation. Vascular/Lymphatic: No significant vascular findings are present. No enlarged abdominal or pelvic lymph nodes. Reproductive: Stable uterine fibroid is noted. No definite adnexal abnormality is noted. Other: Small amount of free fluid is noted in the pelvis. Mild anasarca is noted. No hernia is noted. Musculoskeletal: No acute or significant osseous findings. IMPRESSION: 1. Stable inflammatory changes are noted around the pancreas suggesting acute pancreatitis. There is no definite evidence of pancreatic necrosis seen at this time. 2. Bilateral pleural effusions are noted with adjacent subsegmental atelectasis, left greater than right. 3. Hepatic steatosis. 4. Stable uterine fibroid. 5. Small amount of free fluid is noted in the pelvis. 6.  Mild anasarca. Electronically Signed   By: Marijo Conception M.D.   On: 08/21/2020 09:22   CT ABDOMEN PELVIS W CONTRAST  Result Date: 08/18/2020 CLINICAL DATA:  Epigastric abdominal pain. Nausea and vomiting. Elevated white blood cell count. Hypertension and anemia. EXAM: CT ABDOMEN AND PELVIS WITH CONTRAST TECHNIQUE: Multidetector CT imaging of the abdomen and pelvis was performed using the standard protocol following bolus administration of intravenous contrast. CONTRAST:  141mL OMNIPAQUE IOHEXOL 300 MG/ML  SOLN COMPARISON:  None. FINDINGS: Lower chest: Motion degradation throughout. Mild centrilobular emphysema. Mild cardiomegaly, without pericardial or pleural effusion. Hepatobiliary: Marked hepatic steatosis. Normal gallbladder and biliary tract. Pancreas: Pancreatic and peripancreatic edema are moderate. No pancreatic duct dilatation. Fluid throughout the anterior pararenal space. No well-defined fluid collection. No evidence of pancreatic necrosis. Spleen: Normal in size, without focal abnormality. Adrenals/Urinary Tract: Normal adrenal glands. Normal kidneys, without hydronephrosis. Normal urinary bladder. Stomach/Bowel: Normal stomach, without wall thickening. Normal colon and terminal ileum. Normal small bowel. Vascular/Lymphatic: Normal caliber of the aorta and branch vessels. Patent splenic, portal, hepatic veins. No abdominopelvic adenopathy. Reproductive: Exophytic posterior uterine fibroid of 3.7 cm on 62/3. Other: Small volume intraperitoneal fluid, including within the pelvic cul-de-sac. Musculoskeletal: No acute osseous abnormality. IMPRESSION: 1. Moderate non complicated pancreatitis. Secondary small volume ascites. 2. Hepatic steatosis. 3. Uterine fibroid. 4. Motion degradation. 5. Emphysema (ICD10-J43.9). Electronically Signed   By: Abigail Miyamoto M.D.   On: 08/18/2020 08:52   DG Abd Portable 2V  Result Date: 08/20/2020 CLINICAL DATA:  Lower abdominal pain.   pancreatitis. EXAM: PORTABLE  ABDOMEN - 2 VIEW COMPARISON:  CT 08/18/2020 FINDINGS: Upright and supine views. The upright view demonstrates no free intraperitoneal air or significant air-fluid levels. The supine views demonstrate gas-filled bowel loops, favored to be small bowel including at up to 4.5 cm. Distal gas identified. IMPRESSION: Gaseous distension of small bowel loops, favoring adynamic ileus versus low-grade partial small bowel obstruction. No free intraperitoneal air or other acute complication. Electronically Signed   By: Abigail Miyamoto M.D.   On: 08/20/2020 11:52   [place] EKG SITE RITE  Result Date: 08/21/2020 If Site Rite image not attached, placement could not be confirmed due to current cardiac rhythm.  US Abdomen Limited RUQ  Result Date: 08/26/2020 CLINICAL DATA:  Initial evaluation for hyperbilirubinemia. EXAM: ULTRASOUND ABDOMEN LIMITED RIGHT UPPER QUADRANT COMPARISON:  Prior CT from 08/21/2020. FINDINGS: Gallbladder: No gallstones or wall thickening visualized. No sonographic Murphy sign noted by sonographer. Common bile duct: Diameter: 6 mm Liver: No focal lesion identified. Increased echogenicity within the hepatic parenchyma. Portal vein is patent on color Doppler imaging with normal  direction of blood flow towards the liver. Other: None. IMPRESSION: 1. Normal sonographic appearance of the gallbladder. No biliary dilatation. 2. Increased echogenicity within the hepatic parenchyma, most commonly seen with fatty infiltration. Electronically Signed   By: Jeannine Boga M.D.   On: 08/26/2020 08:08    Microbiology: No results found for this or any previous visit (from the past 240 hour(s)).   Labs: CBC: Recent Labs  Lab 08/23/20 0407 08/23/20 0407 08/24/20 0404 08/25/20 0330 08/27/20 0351 08/27/20 0953 08/27/20 1739  WBC 15.3*  --  15.7* 17.0* 19.7* 19.1*  --   NEUTROABS 11.5*  --  11.8* 12.9* 16.0* 15.7*  --   HGB 7.6*   < > 7.5* 7.4* 6.9* 6.7* 7.8*  HCT 23.8*   < > 23.6* 23.6* 22.3* 22.1* 25.3*   MCV 86.2  --  86.1 86.8 87.8 88.4  --   PLT 67*  --  90* 128* 213 216  --    < > = values in this interval not displayed.   Basic Metabolic Panel: Recent Labs  Lab 08/23/20 0407 08/23/20 0407 08/23/20 1812 08/24/20 0404 08/25/20 0330 08/26/20 0350 08/27/20 0351  NA 136   < > 134* 134* 133* 137 139  K 3.0*   < > 3.0* 2.8* 3.0* 3.0* 3.3*  CL 105   < > 100 101 96* 99 103  CO2 22   < > 21* 24 24 25 25   GLUCOSE 92   < > 104* 100* 119* 83 91  BUN 6   < > 6 7 7 6 6   CREATININE 0.69   < > 0.83 0.80 0.75 0.68 0.67  CALCIUM 8.2*   < > 8.6* 8.3* 8.4* 8.5* 8.8*  MG 2.0  --   --  1.7  --   --   --   PHOS 3.6  --   --  3.8  --   --   --    < > = values in this interval not displayed.   Liver Function Tests: Recent Labs  Lab 08/23/20 0407 08/24/20 0404 08/25/20 0330 08/26/20 0350 08/27/20 0351  AST 49* 47* 48* 44* 38  ALT 26 28 31 30 31   ALKPHOS 44 57 62 66 66  BILITOT 3.4* 3.1* 1.9* 1.6* 1.4*  PROT 6.4* 6.3* 6.5 6.5 6.8  ALBUMIN 2.6* 2.7* 2.7* 2.6* 2.6*   Recent Labs  Lab 08/23/20 0407 08/25/20 0330  LIPASE 92* 109*   No results for input(s): AMMONIA in the last 168 hours. Cardiac Enzymes: No results for input(s): CKTOTAL, CKMB, CKMBINDEX, TROPONINI in the last 168 hours. BNP (last 3 results) No results for input(s): BNP in the last 8760 hours. CBG: No results for input(s): GLUCAP in the last 168 hours.  Time spent: 35 minutes  Signed:  Berle Mull  [facility] 08/27/2020  10:34 PM

## 2020-09-21 ENCOUNTER — Ambulatory Visit: Payer: Medicaid Other | Admitting: Obstetrics and Gynecology

## 2020-10-23 ENCOUNTER — Ambulatory Visit: Payer: Medicaid Other | Admitting: Obstetrics and Gynecology

## 2020-10-30 ENCOUNTER — Ambulatory Visit (INDEPENDENT_AMBULATORY_CARE_PROVIDER_SITE_OTHER): Payer: Medicaid Other | Admitting: Obstetrics and Gynecology

## 2020-10-30 ENCOUNTER — Other Ambulatory Visit (HOSPITAL_COMMUNITY)
Admission: RE | Admit: 2020-10-30 | Discharge: 2020-10-30 | Disposition: A | Discharge status: Home / Self Care | Payer: Medicaid Other | Source: Ambulatory Visit | Attending: Obstetrics and Gynecology | Admitting: Obstetrics and Gynecology

## 2020-10-30 ENCOUNTER — Encounter: Payer: Self-pay | Admitting: Obstetrics and Gynecology

## 2020-10-30 VITALS — BP 175/136 | HR 105 | Ht 69.0 in | Wt 240.5 lb

## 2020-10-30 DIAGNOSIS — N939 Abnormal uterine and vaginal bleeding, unspecified: Secondary | ICD-10-CM | POA: Insufficient documentation

## 2020-10-30 MED ORDER — MEGESTROL ACETATE 40 MG PO TABS: 40.0000 mg | ORAL_TABLET | Freq: Two times a day (BID) | ORAL | 5 refills | Status: DC

## 2020-10-30 NOTE — Progress Notes (Signed)
34 yo presenting today as an ED follow up for the management of AUB. Patient seen in ED in 05/2020. Patient had a Hg 8 at the time. Patient reports a monthly period lasting 7 days followed by several days of spotting. Patient is not sexually active. She was recently treated for trichomonas. Patient is without any other complaints. She reports being treated for her AUB with provera with good results in the recent past  Past Medical History:  Diagnosis Date   History of anemia    no current med.   Hypertension    states under control with med., has been on med. x 3 yr.   Past Surgical History:  Procedure Laterality Date   FOREIGN BODY REMOVAL Left 02/10/2015   Procedure: ATTEMPTED EXPLANTATION OF BIRTH CONTROL DEVICE LEFT ARM;  Surgeon: Johnathan Hausen, MD;  Location: [facility];  Service: General;  Laterality: Left;   FOREIGN BODY REMOVAL Left 05/12/2015   Procedure: REMOVAL OF LEFT ARM BIRTH CONTROL DEVICE;  Surgeon: Johnathan Hausen, MD;  Location: [facility];  Service: General;  Laterality: Left;   SUBMANDIBULAR GLAND EXCISION Right 05/01/2009   Family History  Problem Relation Age of Onset   Breast cancer Mother 35   Social History   Tobacco Use   Smoking status: Current Every Day Smoker    Years: 5.00    Types: Cigarettes   Smokeless tobacco: Never Used   Tobacco comment: 1 pack/week  Substance Use Topics   Alcohol use: Yes    Comment: occasionally   Drug use: No   ROS See pertinent in HPI. All other systems reviewed and non contributory  GENERAL: Well-developed, well-nourished female in no acute distress.  HEENT: Normocephalic, atraumatic. Sclerae anicteric.  NECK: Supple. Normal thyroid.  LUNGS: Clear to auscultation bilaterally.  HEART: Regular rate and rhythm. BREASTS: Symmetric in size. No palpable masses or lymphadenopathy, skin changes, or nipple drainage. ABDOMEN: Soft, nontender, nondistended. No organomegaly. PELVIC: Normal  external female genitalia. Vagina is pink and rugated.  Normal discharge. Normal appearing cervix. Uterus is normal in size.  No adnexal mass or tenderness. EXTREMITIES: No cyanosis, clubbing, or edema, 2+ distal pulses.  05/2020 ultrasound FINDINGS: Uterus  Measurements: 7.5 x 4.4 x 5.2 cm = volume: 89.8 mL. 1.4 x 1.3 x 1.8 cm intramural fibroid present at the left uterine fundus. 4.2 x 3.5 x 3.8 cm exophytic/subserosal fibroid present at the posterior lower uterine segment.  Endometrium  Thickness: 7.8 mm. No focal abnormality visualized. Small amount of mildly complex fluid seen within the endometrial cavity.  Right ovary  Not visualized.  No adnexal mass.  Left ovary  Not visualized.  No adnexal mass.  Other findings  No abnormal free fluid.  IMPRESSION: 1. Endometrial stripe measures 7.8 mm in thickness, with a small amount of mildly complex fluid within the endometrial cavity, likely blood products given history of vaginal bleeding. If bleeding remains unresponsive to hormonal or medical therapy, sonohysterogram should be considered for focal lesion work-up. (Ref: Radiological Reasoning: Algorithmic Workup of Abnormal Vaginal Bleeding with Endovaginal Sonography and Sonohysterography. AJR 2008; 578:I69-62). 2. Underlying fibroid uterus as detailed above. 3. Nonvisualization of either ovary. No adnexal mass or free fluid within the pelvis.   Electronically Signed   By: Jeannine Boga M.D.   On: 05/29/2020 23:53  A/P 34 yo with AUB - Pap smear collected - Discussed medical management with contraception. Patient is interested in IUD - Discussed endometrial biopsy ENDOMETRIAL BIOPSY     The  indications for endometrial biopsy were reviewed.   Risks of the biopsy including cramping, bleeding, infection, uterine perforation, inadequate specimen and need for additional procedures  were discussed. The patient states she understands and agrees to  undergo procedure today. Consent was signed. Time out was performed. Urine HCG was negative. A sterile speculum was placed in the patient's vagina and the cervix was prepped with Betadine. A single-toothed tenaculum was placed on the anterior lip of the cervix to stabilize it. The uterine cavity was sounded to a depth of 7 cm using the uterine sound. The 3 mm pipelle was introduced into the endometrial cavity without difficulty, 2 passes were made.  A  small amount of tissue was  sent to pathology. The instruments were removed from the patient's vagina. Minimal bleeding from the cervix was noted. The patient tolerated the procedure well.  Routine post-procedure instructions were given to the patient. The patient will follow up in two weeks to review the results and for further management.  - RTC in 2-3 weeks for results and IUD insertion

## 2020-10-30 NOTE — Progress Notes (Signed)
New patient is in the office for AUB and was seen at [facility] on 05-29-20. Pt reports that she has a cycle every month but she has spotting in between. Early in the year she was having continuous bleeding. States that she has not been on [facility] in years, LMP 09-26-20

## 2020-10-31 LAB — CERVICOVAGINAL ANCILLARY ONLY
Chlamydia: NEGATIVE
Comment: NEGATIVE
Comment: NEGATIVE
Comment: NORMAL
Neisseria Gonorrhea: NEGATIVE
Trichomonas: NEGATIVE

## 2020-11-01 LAB — SURGICAL PATHOLOGY

## 2020-11-02 ENCOUNTER — Telehealth: Payer: Self-pay

## 2020-11-02 NOTE — Telephone Encounter (Signed)
-----   Message from Mora Bellman, MD sent at 11/02/2020  8:34 AM EST ----- Please inform patient of normal endometrial biopsy. She may schedule appointment for IUD insertion at her convenience  Peggy

## 2020-11-02 NOTE — Telephone Encounter (Signed)
Called patient to advised her of lab results. No answer or voice mail

## 2020-11-03 LAB — CYTOLOGY - PAP
Comment: NEGATIVE
Diagnosis: NEGATIVE
High risk HPV: NEGATIVE

## 2020-11-07 ENCOUNTER — Emergency Department (HOSPITAL_COMMUNITY)
Admission: EM | Admit: 2020-11-07 | Discharge: 2020-11-08 | Disposition: A | Discharge status: Home / Self Care | Payer: Medicaid Other | Source: Home / Self Care | Attending: Emergency Medicine | Admitting: Emergency Medicine

## 2020-11-07 ENCOUNTER — Other Ambulatory Visit: Payer: Self-pay

## 2020-11-07 DIAGNOSIS — I10 Essential (primary) hypertension: Secondary | ICD-10-CM | POA: Insufficient documentation

## 2020-11-07 DIAGNOSIS — M549 Dorsalgia, unspecified: Secondary | ICD-10-CM | POA: Insufficient documentation

## 2020-11-07 DIAGNOSIS — Z79899 Other long term (current) drug therapy: Secondary | ICD-10-CM | POA: Insufficient documentation

## 2020-11-07 DIAGNOSIS — F17210 Nicotine dependence, cigarettes, uncomplicated: Secondary | ICD-10-CM | POA: Insufficient documentation

## 2020-11-07 DIAGNOSIS — R1013 Epigastric pain: Secondary | ICD-10-CM

## 2020-11-07 DIAGNOSIS — R112 Nausea with vomiting, unspecified: Secondary | ICD-10-CM

## 2020-11-07 DIAGNOSIS — K852 Alcohol induced acute pancreatitis without necrosis or infection: Secondary | ICD-10-CM | POA: Insufficient documentation

## 2020-11-07 LAB — COMPREHENSIVE METABOLIC PANEL
ALT: 16 U/L (ref 0–44)
AST: 22 U/L (ref 15–41)
Albumin: 3.9 g/dL (ref 3.5–5.0)
Alkaline Phosphatase: 46 U/L (ref 38–126)
Anion gap: 13 (ref 5–15)
BUN: 7 mg/dL (ref 6–20)
CO2: 21 mmol/L — ABNORMAL LOW (ref 22–32)
Calcium: 9 mg/dL (ref 8.9–10.3)
Chloride: 104 mmol/L (ref 98–111)
Creatinine, Ser: 0.78 mg/dL (ref 0.44–1.00)
GFR, Estimated: >60 mL/min (ref >60)
Glucose, Bld: 146 mg/dL — ABNORMAL HIGH (ref 70–99)
Potassium: 2.8 mmol/L — ABNORMAL LOW (ref 3.5–5.1)
Sodium: 138 mmol/L (ref 135–145)
Total Bilirubin: 1.2 mg/dL (ref 0.3–1.2)
Total Protein: 7.7 g/dL (ref 6.5–8.1)

## 2020-11-07 LAB — CBC WITH DIFFERENTIAL/PLATELET
Abs Immature Granulocytes: 0.05 10*3/uL (ref 0.00–0.07)
Basophils Absolute: 0 10*3/uL (ref 0.0–0.1)
Basophils Relative: 0 %
Eosinophils Absolute: 0 10*3/uL (ref 0.0–0.5)
Eosinophils Relative: 0 %
HCT: 31.5 % — ABNORMAL LOW (ref 36.0–46.0)
Hemoglobin: 9.5 g/dL — ABNORMAL LOW (ref 12.0–15.0)
Immature Granulocytes: 1 %
Lymphocytes Relative: 10 %
Lymphs Abs: 1.1 10*3/uL (ref 0.7–4.0)
MCH: 23.8 pg — ABNORMAL LOW (ref 26.0–34.0)
MCHC: 30.2 g/dL (ref 30.0–36.0)
MCV: 78.8 fL — ABNORMAL LOW (ref 80.0–100.0)
Monocytes Absolute: 0.7 10*3/uL (ref 0.1–1.0)
Monocytes Relative: 7 %
Neutro Abs: 8.7 10*3/uL — ABNORMAL HIGH (ref 1.7–7.7)
Neutrophils Relative %: 82 %
Platelets: 211 10*3/uL (ref 150–400)
RBC: 4 MIL/uL (ref 3.87–5.11)
RDW: 18.1 % — ABNORMAL HIGH (ref 11.5–15.5)
WBC: 10.6 10*3/uL — ABNORMAL HIGH (ref 4.0–10.5)
nRBC: 0 % (ref 0.0–0.2)

## 2020-11-07 LAB — LIPASE, BLOOD: Lipase: 28 U/L (ref 11–51)

## 2020-11-07 LAB — ETHANOL: Alcohol, Ethyl (B): <10 mg/dL (ref <10)

## 2020-11-07 LAB — I-STAT BETA HCG BLOOD, ED (MC, WL, AP ONLY): I-stat hCG, quantitative: <5 m[IU]/mL (ref <5)

## 2020-11-07 LAB — MAGNESIUM: Magnesium: 1.4 mg/dL — ABNORMAL LOW (ref 1.7–2.4)

## 2020-11-07 MED ORDER — MAGNESIUM SULFATE 2 GM/50ML IV SOLN
2.0000 g | Freq: Once | INTRAVENOUS | Status: AC
  Administered 2020-11-07: 2 g via INTRAVENOUS
  Filled 2020-11-07: qty 50

## 2020-11-07 MED ORDER — POTASSIUM CHLORIDE 10 MEQ/100ML IV SOLN
10.0000 meq | INTRAVENOUS | Status: AC
  Administered 2020-11-08 (×2): 10 meq via INTRAVENOUS
  Filled 2020-11-07 (×2): qty 100

## 2020-11-07 MED ORDER — SODIUM CHLORIDE 0.9 % IV BOLUS
1000.0000 mL | Freq: Once | INTRAVENOUS | Status: AC
  Administered 2020-11-07: 1000 mL via INTRAVENOUS

## 2020-11-07 MED ORDER — HYDROMORPHONE HCL 1 MG/ML IJ SOLN
1.0000 mg | Freq: Once | INTRAMUSCULAR | Status: AC
  Administered 2020-11-07: 1 mg via INTRAVENOUS
  Filled 2020-11-07: qty 1

## 2020-11-07 MED ORDER — ONDANSETRON HCL 4 MG/2ML IJ SOLN
4.0000 mg | Freq: Once | INTRAMUSCULAR | Status: AC
  Administered 2020-11-07: 4 mg via INTRAVENOUS
  Filled 2020-11-07: qty 2

## 2020-11-07 MED ORDER — MORPHINE SULFATE (PF) 4 MG/ML IV SOLN
4.0000 mg | Freq: Once | INTRAVENOUS | Status: AC
  Administered 2020-11-07: 4 mg via INTRAVENOUS
  Filled 2020-11-07: qty 1

## 2020-11-07 NOTE — ED Triage Notes (Signed)
Pt c/o sudden 10/10 abdominal pain across abdomen. Hx of pancreatitis. EtOH yesterday.

## 2020-11-07 NOTE — ED Provider Notes (Signed)
[facility] DEPT Provider Note   CSN: [id number] Arrival date & time: 11/07/20  2213     History Chief Complaint  Patient presents with  . Abdominal Pain    Robin Guerrero is a 34 y.o. female.  The history is provided by the patient and medical records.  Abdominal Pain Associated symptoms: nausea and vomiting     34 year old female with history of hypertension and alcohol induced pancreatitis, presenting to the ED with abdominal pain.  States is just started this morning upon waking.  Pain across her upper abdomen and into the back.  She has had associated nausea and vomiting.  Denies any fevers or chills.  Does admit to alcohol use yesterday and over the weekend.  Per last hospitalization, has had increased alcohol consumption over the past year since the death of her mother.  No meds PTA.  Past Medical History:  Diagnosis Date  . History of anemia    no current med.  . Hypertension    states under control with med., has been on med. x 3 yr.    Patient Active Problem List   Diagnosis Date Noted  . Hypocalcemia 08/19/2020  . Acute alcoholic pancreatitis 78/29/5621  . Thrombocytopenia ([place]) 08/19/2020  . Normocytic anemia 08/19/2020  . Alcoholic pancreatitis 30/86/5784  . SIRS (systemic inflammatory response syndrome) ([place]) 08/18/2020  . Hypokalemia 08/18/2020  . Hypomagnesemia 08/18/2020  . Prolonged QT interval 08/18/2020  . Essential hypertension 08/18/2020  . Depression 08/18/2020  . Tobacco use 08/18/2020    Past Surgical History:  Procedure Laterality Date  . FOREIGN BODY REMOVAL Left 02/10/2015   Procedure: ATTEMPTED EXPLANTATION OF BIRTH CONTROL DEVICE LEFT ARM;  Surgeon: Johnathan Hausen, MD;  Location: [facility];  Service: General;  Laterality: Left;  . FOREIGN BODY REMOVAL Left 05/12/2015   Procedure: REMOVAL OF LEFT ARM BIRTH CONTROL DEVICE;  Surgeon: Johnathan Hausen, MD;  Location: [facility];   Service: General;  Laterality: Left;  . SUBMANDIBULAR GLAND EXCISION Right 05/01/2009     OB History    Gravida  4   Para  2   Term  2   Preterm      AB  2   Living  2     SAB      TAB  2   Ectopic      Multiple      Live Births  2           Family History  Problem Relation Age of Onset  . Breast cancer Mother 57    Social History   Tobacco Use  . Smoking status: Current Every Day Smoker    Years: 5.00    Types: Cigarettes  . Smokeless tobacco: Never Used  . Tobacco comment: 1 pack/week  Substance Use Topics  . Alcohol use: Yes    Comment: occasionally  . Drug use: No    Home Medications Prior to Admission medications   Medication Sig Start Date End Date Taking? Authorizing Provider  amLODipine (NORVASC) 5 MG tablet Take 5 mg by mouth daily. 05/25/20   [provider]  dicyclomine (BENTYL) 10 MG capsule Take 1 capsule (10 mg total) by mouth 3 (three) times daily as needed for spasms. 08/27/20   Lavina Hamman, MD  docusate sodium (COLACE) 100 MG capsule Take 1 capsule (100 mg total) by mouth daily as needed. 08/27/20 08/27/21  Lavina Hamman, MD  ferrous sulfate 325 (65 FE) MG tablet Take 1  tablet (325 mg total) by mouth daily. 05/30/20   Henderly, Britni A, PA-C  folic acid (FOLVITE) 1 MG tablet Take 1 tablet (1 mg total) by mouth daily. 08/28/20   Lavina Hamman, MD  gabapentin (NEURONTIN) 100 MG capsule Take 1 capsule (100 mg total) by mouth 3 (three) times daily. 08/27/20   Lavina Hamman, MD  HYDROcodone-acetaminophen (NORCO/VICODIN) 5-325 MG tablet Take 2 tablets by mouth 3 (three) times daily as needed for moderate pain or severe pain. Patient not taking: Reported on 10/30/2020 08/27/20 08/27/21  Lavina Hamman, MD  losartan (COZAAR) 100 MG tablet Take 1 tablet (100 mg total) by mouth daily. 08/28/20   Lavina Hamman, MD  megestrol (MEGACE) 40 MG tablet Take 1 tablet (40 mg total) by mouth 2 (two) times daily. Can increase to two tablets  twice a day in the event of heavy bleeding 10/30/20   Constant, Peggy, MD  metoprolol tartrate 75 MG TABS Take 50 mg by mouth 2 (two) times daily. 08/27/20   Lavina Hamman, MD  nicotine (NICODERM CQ - DOSED IN MG/24 HOURS) 14 mg/24hr patch Place 1 patch (14 mg total) onto the skin daily. Patient not taking: Reported on 10/30/2020 08/28/20   Lavina Hamman, MD  pantoprazole (PROTONIX) 40 MG tablet Take 1 tablet (40 mg total) by mouth daily. 08/27/20   Lavina Hamman, MD  simethicone (MYLICON) 80 MG chewable tablet Chew 1 tablet (80 mg total) by mouth 4 (four) times daily as needed for flatulence. Patient not taking: Reported on 10/30/2020 08/27/20   Lavina Hamman, MD  thiamine 100 MG tablet Take 1 tablet (100 mg total) by mouth daily. 08/28/20   Lavina Hamman, MD    Allergies    Patient has no known allergies.  Review of Systems   Review of Systems  Gastrointestinal: Positive for abdominal pain, nausea and vomiting.  All other systems reviewed and are negative.   Physical Exam Updated Vital Signs BP (!) 200/125 (BP Location: Right Arm)   Pulse (!) 112   Temp 98.1 F (36.7 C) (Oral)   Resp (!) 22   SpO2 100%   Physical Exam Vitals and nursing note reviewed.  Constitutional:      Appearance: She is well-developed.     Comments: Moaning, uncomfortable appearing  HENT:     Head: Normocephalic and atraumatic.  Eyes:     Conjunctiva/sclera: Conjunctivae normal.     Pupils: Pupils are equal, round, and reactive to light.  Cardiovascular:     Rate and Rhythm: Normal rate and regular rhythm.     Heart sounds: Normal heart sounds.  Pulmonary:     Effort: Pulmonary effort is normal.     Breath sounds: Normal breath sounds.  Abdominal:     General: Bowel sounds are normal.     Palpations: Abdomen is soft.     Tenderness: There is abdominal tenderness in the epigastric area. There is no guarding or rebound.  Musculoskeletal:        General: Normal range of motion.     Cervical  back: Normal range of motion.  Skin:    General: Skin is warm and dry.  Neurological:     Mental Status: She is alert and oriented to person, place, and time.     Comments: No tremors or seizure activity, does not appear to be withdrawing     ED Results / Procedures / Treatments   Labs (all labs ordered are listed, but only  abnormal results are displayed) Labs Reviewed  CBC WITH DIFFERENTIAL/PLATELET - Abnormal; Notable for the following components:      Result Value   WBC 10.6 (*)    Hemoglobin 9.5 (*)    HCT 31.5 (*)    MCV 78.8 (*)    MCH 23.8 (*)    RDW 18.1 (*)    Neutro Abs 8.7 (*)    All other components within normal limits  COMPREHENSIVE METABOLIC PANEL - Abnormal; Notable for the following components:   Potassium 2.8 (*)    CO2 21 (*)    Glucose, Bld 146 (*)    All other components within normal limits  MAGNESIUM - Abnormal; Notable for the following components:   Magnesium 1.4 (*)    All other components within normal limits  ETHANOL  LIPASE, BLOOD  I-STAT BETA HCG BLOOD, ED ([facility], [facility], [facility] ONLY)    EKG None  Radiology No results found.  Procedures Procedures (including critical care time)  CRITICAL CARE Performed by: Larene Pickett   Total critical care time: 35 minutes  Critical care time was exclusive of separately billable procedures and treating other patients.  Critical care was necessary to treat or prevent imminent or life-threatening deterioration.  Critical care was time spent personally by me on the following activities: development of treatment plan with patient and/or surrogate as well as nursing, discussions with consultants, evaluation of patient's response to treatment, examination of patient, obtaining history from patient or surrogate, ordering and performing treatments and interventions, ordering and review of laboratory studies, ordering and review of radiographic studies, pulse oximetry and re-evaluation of patient's  condition.   Medications Ordered in ED Medications  losartan (COZAAR) tablet 100 mg (100 mg Oral Not Given 11/08/20 0106)  sodium chloride 0.9 % bolus 1,000 mL (0 mLs Intravenous Stopped 11/08/20 0237)  HYDROmorphone (DILAUDID) injection 1 mg (1 mg Intravenous Given 11/07/20 2256)  ondansetron (ZOFRAN) injection 4 mg (4 mg Intravenous Given 11/07/20 2256)  magnesium sulfate IVPB 2 g 50 mL ( Intravenous Stopped 11/08/20 0027)  potassium chloride 10 mEq in 100 mL IVPB (0 mEq Intravenous Stopped 11/08/20 0237)  morphine 4 MG/ML injection 4 mg (4 mg Intravenous Given 11/07/20 2350)  potassium chloride SA (KLOR-CON) CR tablet 40 mEq (40 mEq Oral Given 11/08/20 0035)  metoprolol tartrate (LOPRESSOR) tablet 75 mg (75 mg Oral Given 11/08/20 0034)  ketorolac (TORADOL) 30 MG/ML injection 30 mg (30 mg Intravenous Given 11/08/20 0128)  hydrALAZINE (APRESOLINE) injection 10 mg (10 mg Intravenous Given 11/08/20 0219)    ED Course  I have reviewed the triage vital signs and the nursing notes.  Pertinent labs & imaging results that were available during my care of the patient were reviewed by me and considered in my medical decision making (see chart for details).    MDM Rules/Calculators/A&P  34 year old female presenting to the ED with abdominal pain and vomiting.  Does report alcohol use over the weekend and feels like she may have pancreatitis again.  She is moaning and appears uncomfortable on initial exam.  Does have some epigastric tenderness and reports it rolls into her back.  She is not actively vomiting currently.  She has mildly tachycardic and hypertensive, question if this is related to pain.  She has no tremors or seizure activity, does not appear to be actively withdrawing at present.  Will obtain screening labs, given IV fluids, antiemetics, and pain medication.  Labs as above, does have low potassium and magnesium.  Has been given  IV repletion.  Her lipase today is actually normal.  She  is no longer vomiting and states nausea is better.  IV potassium infusing.  Will monitor.  12:28 AM BP remains elevated here.  Patient no longer vomiting.  States she has not taken any of her her BP meds today at all due to vomiting.  Will try to give home PO meds here along with additional PO supplementation.  2:14 AM Patient's BP still elevated, 224/134 on last re-check.  She does not have evidence of end organ damage currently.  No chest pain, SOB, headache, etc. Seems to be related to not having her home meds.  Will give dose of hydralazine to try and lower at least 20-25%.  Given IV toradol due to continued pain.  2:56 AM BP improved after hydralazine, now 188/124.  This is a reasonable improvement.  Still a little tachy but I suspect this is since she missed doses of her beta blocker today.  She is tolerating PO sprite without issue.  Remains without tremors or seizure activity to suggest acute withdrawal.  States pain is actually a lot better after toradol.  Will need to continue home BP meds and follow-up closely with PCP.  Rx zofran, bentyl.  Return here for any new/acute changes.  Final Clinical Impression(s) / ED Diagnoses Final diagnoses:  Epigastric pain  Non-intractable vomiting with nausea, unspecified vomiting type  Hypertension, unspecified type    Rx / DC Orders ED Discharge Orders         Ordered    ondansetron (ZOFRAN ODT) 4 MG disintegrating tablet  Every 8 hours PRN        11/08/20 0239    dicyclomine (BENTYL) 20 MG tablet  2 times daily        11/08/20 0239           Larene Pickett, PA-C 11/08/20 0257    Maudie Flakes, MD 11/10/20 [id number]

## 2020-11-08 MED ORDER — METOPROLOL TARTRATE 25 MG PO TABS
75.0000 mg | ORAL_TABLET | Freq: Once | ORAL | Status: AC
  Administered 2020-11-08: 75 mg via ORAL
  Filled 2020-11-08: qty 3

## 2020-11-08 MED ORDER — LOSARTAN POTASSIUM 25 MG PO TABS
100.0000 mg | ORAL_TABLET | Freq: Once | ORAL | Status: DC
  Filled 2020-11-08: qty 4

## 2020-11-08 MED ORDER — ONDANSETRON 4 MG PO TBDP: 4.0000 mg | ORAL_TABLET | Freq: Three times a day (TID) | ORAL | 0 refills | Status: DC | PRN

## 2020-11-08 MED ORDER — KETOROLAC TROMETHAMINE 30 MG/ML IJ SOLN
30.0000 mg | Freq: Once | INTRAMUSCULAR | Status: AC
  Administered 2020-11-08: 30 mg via INTRAVENOUS
  Filled 2020-11-08: qty 1

## 2020-11-08 MED ORDER — POTASSIUM CHLORIDE CRYS ER 20 MEQ PO TBCR
40.0000 meq | EXTENDED_RELEASE_TABLET | Freq: Once | ORAL | Status: AC
  Administered 2020-11-08: 40 meq via ORAL
  Filled 2020-11-08: qty 2

## 2020-11-08 MED ORDER — HYDRALAZINE HCL 20 MG/ML IJ SOLN
10.0000 mg | INTRAMUSCULAR | Status: AC
  Administered 2020-11-08: 10 mg via INTRAVENOUS
  Filled 2020-11-08: qty 1

## 2020-11-08 MED ORDER — DICYCLOMINE HCL 20 MG PO TABS: 20.0000 mg | ORAL_TABLET | Freq: Two times a day (BID) | ORAL | 0 refills | Status: DC

## 2020-11-08 NOTE — ED Notes (Signed)
10 mg hydralazine given over 5 minutes. Explained goal to patient.

## 2020-11-08 NOTE — ED Notes (Addendum)
ED PA at bedside

## 2020-11-08 NOTE — ED Notes (Signed)
Discharge instructions discussed with patient, including medication management and close follow up care with PCP for BP. Verbalized understanding.

## 2020-11-08 NOTE — Discharge Instructions (Signed)
Take the prescribed medication as directed.  Make sure to continue your blood pressure medications as directed as your pressures were higher than normal today. Follow-up with your primary care doctor. Return to the ED for new or worsening symptoms.

## 2020-11-08 NOTE — ED Notes (Signed)
Pt requesting more pain medicine, endorsing 10/10 pain. Lattie Haw, [place] aware.

## 2020-11-09 ENCOUNTER — Other Ambulatory Visit: Payer: Self-pay

## 2020-11-09 ENCOUNTER — Inpatient Hospital Stay (HOSPITAL_COMMUNITY)
Admission: EM | Admit: 2020-11-09 | Discharge: 2020-11-18 | DRG: 299 | Disposition: A | Discharge status: Home Health | Payer: Medicaid Other | Attending: Internal Medicine | Admitting: Internal Medicine

## 2020-11-09 ENCOUNTER — Emergency Department (HOSPITAL_COMMUNITY): Payer: Medicaid Other

## 2020-11-09 ENCOUNTER — Encounter (HOSPITAL_COMMUNITY): Payer: Self-pay

## 2020-11-09 DIAGNOSIS — Z91138 Patient's unintentional underdosing of medication regimen for other reason: Secondary | ICD-10-CM

## 2020-11-09 DIAGNOSIS — D649 Anemia, unspecified: Secondary | ICD-10-CM | POA: Diagnosis not present

## 2020-11-09 DIAGNOSIS — Z5320 Procedure and treatment not carried out because of patient's decision for unspecified reasons: Secondary | ICD-10-CM | POA: Diagnosis not present

## 2020-11-09 DIAGNOSIS — Z515 Encounter for palliative care: Secondary | ICD-10-CM | POA: Diagnosis not present

## 2020-11-09 DIAGNOSIS — Z79899 Other long term (current) drug therapy: Secondary | ICD-10-CM | POA: Diagnosis not present

## 2020-11-09 DIAGNOSIS — E876 Hypokalemia: Secondary | ICD-10-CM | POA: Diagnosis present

## 2020-11-09 DIAGNOSIS — Z20822 Contact with and (suspected) exposure to covid-19: Secondary | ICD-10-CM | POA: Diagnosis present

## 2020-11-09 DIAGNOSIS — I7100 Dissection of unspecified site of aorta: Secondary | ICD-10-CM | POA: Diagnosis present

## 2020-11-09 DIAGNOSIS — M549 Dorsalgia, unspecified: Secondary | ICD-10-CM | POA: Insufficient documentation

## 2020-11-09 DIAGNOSIS — F10231 Alcohol dependence with withdrawal delirium: Secondary | ICD-10-CM | POA: Diagnosis not present

## 2020-11-09 DIAGNOSIS — I7101 Dissection of thoracic aorta: Secondary | ICD-10-CM | POA: Diagnosis present

## 2020-11-09 DIAGNOSIS — G4700 Insomnia, unspecified: Secondary | ICD-10-CM | POA: Diagnosis not present

## 2020-11-09 DIAGNOSIS — I10 Essential (primary) hypertension: Secondary | ICD-10-CM | POA: Diagnosis present

## 2020-11-09 DIAGNOSIS — E669 Obesity, unspecified: Secondary | ICD-10-CM | POA: Diagnosis present

## 2020-11-09 DIAGNOSIS — D509 Iron deficiency anemia, unspecified: Secondary | ICD-10-CM | POA: Diagnosis present

## 2020-11-09 DIAGNOSIS — Z6836 Body mass index (BMI) 36.0-36.9, adult: Secondary | ICD-10-CM

## 2020-11-09 DIAGNOSIS — F17210 Nicotine dependence, cigarettes, uncomplicated: Secondary | ICD-10-CM | POA: Diagnosis present

## 2020-11-09 DIAGNOSIS — Z66 Do not resuscitate: Secondary | ICD-10-CM | POA: Diagnosis not present

## 2020-11-09 DIAGNOSIS — Z781 Physical restraint status: Secondary | ICD-10-CM | POA: Diagnosis not present

## 2020-11-09 DIAGNOSIS — E872 Acidosis: Secondary | ICD-10-CM | POA: Diagnosis not present

## 2020-11-09 DIAGNOSIS — K852 Alcohol induced acute pancreatitis without necrosis or infection: Secondary | ICD-10-CM | POA: Diagnosis present

## 2020-11-09 DIAGNOSIS — Z803 Family history of malignant neoplasm of breast: Secondary | ICD-10-CM | POA: Diagnosis not present

## 2020-11-09 DIAGNOSIS — E871 Hypo-osmolality and hyponatremia: Secondary | ICD-10-CM | POA: Diagnosis present

## 2020-11-09 DIAGNOSIS — F1010 Alcohol abuse, uncomplicated: Secondary | ICD-10-CM | POA: Diagnosis not present

## 2020-11-09 DIAGNOSIS — R110 Nausea: Secondary | ICD-10-CM | POA: Insufficient documentation

## 2020-11-09 DIAGNOSIS — F320 Major depressive disorder, single episode, mild: Secondary | ICD-10-CM | POA: Diagnosis not present

## 2020-11-09 DIAGNOSIS — Z8719 Personal history of other diseases of the digestive system: Secondary | ICD-10-CM | POA: Diagnosis not present

## 2020-11-09 DIAGNOSIS — Z7189 Other specified counseling: Secondary | ICD-10-CM | POA: Diagnosis not present

## 2020-11-09 DIAGNOSIS — F1910 Other psychoactive substance abuse, uncomplicated: Secondary | ICD-10-CM | POA: Diagnosis not present

## 2020-11-09 DIAGNOSIS — I71012 Dissection of descending thoracic aorta: Secondary | ICD-10-CM

## 2020-11-09 DIAGNOSIS — T50916A Underdosing of multiple unspecified drugs, medicaments and biological substances, initial encounter: Secondary | ICD-10-CM | POA: Diagnosis present

## 2020-11-09 DIAGNOSIS — I7102 Dissection of abdominal aorta: Principal | ICD-10-CM | POA: Diagnosis present

## 2020-11-09 DIAGNOSIS — I161 Hypertensive emergency: Secondary | ICD-10-CM | POA: Diagnosis present

## 2020-11-09 DIAGNOSIS — R112 Nausea with vomiting, unspecified: Secondary | ICD-10-CM | POA: Insufficient documentation

## 2020-11-09 DIAGNOSIS — R451 Restlessness and agitation: Secondary | ICD-10-CM | POA: Diagnosis not present

## 2020-11-09 DIAGNOSIS — Z720 Tobacco use: Secondary | ICD-10-CM | POA: Diagnosis not present

## 2020-11-09 LAB — URINALYSIS, ROUTINE W REFLEX MICROSCOPIC
Bilirubin Urine: NEGATIVE
Glucose, UA: NEGATIVE mg/dL
Hgb urine dipstick: NEGATIVE
Ketones, ur: NEGATIVE mg/dL
Leukocytes,Ua: NEGATIVE
Nitrite: NEGATIVE
Protein, ur: NEGATIVE mg/dL
Specific Gravity, Urine: 1.005 (ref 1.005–1.030)
pH: 6 (ref 5.0–8.0)

## 2020-11-09 LAB — CBC WITH DIFFERENTIAL/PLATELET
Abs Immature Granulocytes: 0.03 10*3/uL (ref 0.00–0.07)
Basophils Absolute: 0 10*3/uL (ref 0.0–0.1)
Basophils Relative: 0 %
Eosinophils Absolute: 0 10*3/uL (ref 0.0–0.5)
Eosinophils Relative: 0 %
HCT: 31.9 % — ABNORMAL LOW (ref 36.0–46.0)
Hemoglobin: 9.5 g/dL — ABNORMAL LOW (ref 12.0–15.0)
Immature Granulocytes: 0 %
Lymphocytes Relative: 10 %
Lymphs Abs: 0.9 10*3/uL (ref 0.7–4.0)
MCH: 23.9 pg — ABNORMAL LOW (ref 26.0–34.0)
MCHC: 29.8 g/dL — ABNORMAL LOW (ref 30.0–36.0)
MCV: 80.2 fL (ref 80.0–100.0)
Monocytes Absolute: 0.7 10*3/uL (ref 0.1–1.0)
Monocytes Relative: 8 %
Neutro Abs: 6.9 10*3/uL (ref 1.7–7.7)
Neutrophils Relative %: 82 %
Platelets: 191 10*3/uL (ref 150–400)
RBC: 3.98 MIL/uL (ref 3.87–5.11)
RDW: 18.6 % — ABNORMAL HIGH (ref 11.5–15.5)
WBC: 8.5 10*3/uL (ref 4.0–10.5)
nRBC: 0 % (ref 0.0–0.2)

## 2020-11-09 LAB — LIPASE, BLOOD: Lipase: 125 U/L — ABNORMAL HIGH (ref 11–51)

## 2020-11-09 LAB — COMPREHENSIVE METABOLIC PANEL
ALT: 14 U/L (ref 0–44)
AST: 15 U/L (ref 15–41)
Albumin: 3.7 g/dL (ref 3.5–5.0)
Alkaline Phosphatase: 48 U/L (ref 38–126)
Anion gap: 11 (ref 5–15)
BUN: 9 mg/dL (ref 6–20)
CO2: 24 mmol/L (ref 22–32)
Calcium: 9.1 mg/dL (ref 8.9–10.3)
Chloride: 99 mmol/L (ref 98–111)
Creatinine, Ser: 0.81 mg/dL (ref 0.44–1.00)
GFR, Estimated: >60 mL/min (ref >60)
Glucose, Bld: 104 mg/dL — ABNORMAL HIGH (ref 70–99)
Potassium: 3 mmol/L — ABNORMAL LOW (ref 3.5–5.1)
Sodium: 134 mmol/L — ABNORMAL LOW (ref 135–145)
Total Bilirubin: 2.1 mg/dL — ABNORMAL HIGH (ref 0.3–1.2)
Total Protein: 7.8 g/dL (ref 6.5–8.1)

## 2020-11-09 LAB — I-STAT BETA HCG BLOOD, ED (MC, WL, AP ONLY): I-stat hCG, quantitative: <5 m[IU]/mL (ref <5)

## 2020-11-09 LAB — RESP PANEL BY RT-PCR (FLU A&B, COVID) ARPGX2
Influenza A by PCR: NEGATIVE
Influenza B by PCR: NEGATIVE
SARS Coronavirus 2 by RT PCR: NEGATIVE

## 2020-11-09 MED ORDER — SODIUM CHLORIDE 0.9 % IV BOLUS
1000.0000 mL | Freq: Once | INTRAVENOUS | Status: AC
  Administered 2020-11-09: 1000 mL via INTRAVENOUS

## 2020-11-09 MED ORDER — DOCUSATE SODIUM 100 MG PO CAPS: 100.0000 mg | ORAL_CAPSULE | Freq: Two times a day (BID) | ORAL | Status: DC | PRN

## 2020-11-09 MED ORDER — HYDRALAZINE HCL 20 MG/ML IJ SOLN
10.0000 mg | INTRAMUSCULAR | Status: DC | PRN
  Administered 2020-11-09 – 2020-11-10 (×2): 20 mg via INTRAVENOUS
  Filled 2020-11-09 (×3): qty 1

## 2020-11-09 MED ORDER — NICOTINE 7 MG/24HR TD PT24: 7.0000 mg | MEDICATED_PATCH | Freq: Every day | TRANSDERMAL | Status: DC

## 2020-11-09 MED ORDER — HYDROMORPHONE HCL 1 MG/ML IJ SOLN
1.0000 mg | Freq: Once | INTRAMUSCULAR | Status: AC
  Administered 2020-11-09: 1 mg via INTRAVENOUS
  Filled 2020-11-09: qty 1

## 2020-11-09 MED ORDER — SODIUM CHLORIDE (PF) 0.9 % IJ SOLN
INTRAMUSCULAR | Status: AC
  Filled 2020-11-09: qty 100

## 2020-11-09 MED ORDER — POLYETHYLENE GLYCOL 3350 17 G PO PACK: 17.0000 g | PACK | Freq: Every day | ORAL | Status: DC | PRN

## 2020-11-09 MED ORDER — IOHEXOL 350 MG/ML SOLN
100.0000 mL | Freq: Once | INTRAVENOUS | Status: AC | PRN
  Administered 2020-11-09: 100 mL via INTRAVENOUS

## 2020-11-09 MED ORDER — SODIUM CHLORIDE (PF) 0.9 % IJ SOLN
INTRAMUSCULAR | Status: AC
  Filled 2020-11-09: qty 50

## 2020-11-09 MED ORDER — SODIUM CHLORIDE 0.9 % IV SOLN: INTRAVENOUS | Status: DC | PRN

## 2020-11-09 MED ORDER — SODIUM CHLORIDE 0.9 % IV SOLN: INTRAVENOUS | Status: DC

## 2020-11-09 MED ORDER — ONDANSETRON HCL 4 MG/2ML IJ SOLN
4.0000 mg | Freq: Four times a day (QID) | INTRAMUSCULAR | Status: DC | PRN
  Administered 2020-11-09 – 2020-11-10 (×2): 4 mg via INTRAVENOUS
  Filled 2020-11-09 (×2): qty 2

## 2020-11-09 MED ORDER — IOHEXOL 300 MG/ML  SOLN
100.0000 mL | Freq: Once | INTRAMUSCULAR | Status: AC | PRN
  Administered 2020-11-09: 100 mL via INTRAVENOUS

## 2020-11-09 MED ORDER — POTASSIUM CHLORIDE 10 MEQ/100ML IV SOLN
10.0000 meq | Freq: Once | INTRAVENOUS | Status: AC
  Administered 2020-11-09: 10 meq via INTRAVENOUS
  Filled 2020-11-09: qty 100

## 2020-11-09 MED ORDER — THIAMINE HCL 100 MG PO TABS
100.0000 mg | ORAL_TABLET | Freq: Every day | ORAL | Status: DC
  Administered 2020-11-09 – 2020-11-11 (×3): 100 mg via ORAL
  Filled 2020-11-09 (×3): qty 1

## 2020-11-09 MED ORDER — POTASSIUM CHLORIDE CRYS ER 20 MEQ PO TBCR
40.0000 meq | EXTENDED_RELEASE_TABLET | Freq: Once | ORAL | Status: AC
  Administered 2020-11-09: 40 meq via ORAL
  Filled 2020-11-09: qty 2

## 2020-11-09 MED ORDER — ESMOLOL HCL-SODIUM CHLORIDE 2000 MG/100ML IV SOLN
25.0000 ug/kg/min | INTRAVENOUS | Status: DC
  Administered 2020-11-09: 25.008 ug/kg/min via INTRAVENOUS
  Administered 2020-11-09 – 2020-11-10 (×2): 275 ug/kg/min via INTRAVENOUS
  Administered 2020-11-10: 150 ug/kg/min via INTRAVENOUS
  Administered 2020-11-10: 275 ug/kg/min via INTRAVENOUS
  Administered 2020-11-10: 61.218 ug/kg/min via INTRAVENOUS
  Administered 2020-11-10: 275 ug/kg/min via INTRAVENOUS
  Administered 2020-11-10: 150 ug/kg/min via INTRAVENOUS
  Administered 2020-11-10: 275 ug/kg/min via INTRAVENOUS
  Administered 2020-11-10: 100 ug/kg/min via INTRAVENOUS
  Administered 2020-11-10 (×3): 275 ug/kg/min via INTRAVENOUS
  Administered 2020-11-10 (×2): 175 ug/kg/min via INTRAVENOUS
  Administered 2020-11-10 (×2): 275 ug/kg/min via INTRAVENOUS
  Administered 2020-11-11 (×3): 300 ug/kg/min via INTRAVENOUS
  Administered 2020-11-11: 275 ug/kg/min via INTRAVENOUS
  Administered 2020-11-11: 250 ug/kg/min via INTRAVENOUS
  Administered 2020-11-11: 300 ug/kg/min via INTRAVENOUS
  Administered 2020-11-11: 250 ug/kg/min via INTRAVENOUS
  Administered 2020-11-11: 275 ug/kg/min via INTRAVENOUS
  Administered 2020-11-11: 250 ug/kg/min via INTRAVENOUS
  Administered 2020-11-11: 275 ug/kg/min via INTRAVENOUS
  Administered 2020-11-11 (×5): 300 ug/kg/min via INTRAVENOUS
  Administered 2020-11-11 (×3): 250 ug/kg/min via INTRAVENOUS
  Administered 2020-11-11: 300 ug/kg/min via INTRAVENOUS
  Administered 2020-11-11: 250 ug/kg/min via INTRAVENOUS
  Administered 2020-11-12: 61.218 ug/kg/min via INTRAVENOUS
  Administered 2020-11-12 (×2): 250 ug/kg/min via INTRAVENOUS
  Administered 2020-11-12: 225 ug/kg/min via INTRAVENOUS
  Administered 2020-11-12: 150 ug/kg/min via INTRAVENOUS
  Administered 2020-11-12: 61.218 ug/kg/min via INTRAVENOUS
  Administered 2020-11-12: 125 ug/kg/min via INTRAVENOUS
  Administered 2020-11-12: 250 ug/kg/min via INTRAVENOUS
  Filled 2020-11-09 (×51): qty 100

## 2020-11-09 MED ORDER — NICOTINE 14 MG/24HR TD PT24
14.0000 mg | MEDICATED_PATCH | Freq: Every day | TRANSDERMAL | Status: DC
  Administered 2020-11-10 – 2020-11-18 (×9): 14 mg via TRANSDERMAL
  Filled 2020-11-09 (×9): qty 1

## 2020-11-09 MED ORDER — FOLIC ACID 1 MG PO TABS
1.0000 mg | ORAL_TABLET | Freq: Every day | ORAL | Status: DC
  Administered 2020-11-09 – 2020-11-11 (×3): 1 mg via ORAL
  Filled 2020-11-09 (×3): qty 1

## 2020-11-09 MED ORDER — FENTANYL CITRATE (PF) 100 MCG/2ML IJ SOLN
25.0000 ug | INTRAMUSCULAR | Status: DC | PRN
  Administered 2020-11-09 – 2020-11-10 (×2): 50 ug via INTRAVENOUS
  Administered 2020-11-10: 75 ug via INTRAVENOUS
  Administered 2020-11-10: 100 ug via INTRAVENOUS
  Administered 2020-11-10 (×2): 50 ug via INTRAVENOUS
  Administered 2020-11-11: 100 ug via INTRAVENOUS
  Filled 2020-11-09 (×7): qty 2

## 2020-11-09 MED ORDER — LORAZEPAM 2 MG/ML IJ SOLN
0.5000 mg | Freq: Once | INTRAMUSCULAR | Status: AC
  Administered 2020-11-09: 0.5 mg via INTRAVENOUS
  Filled 2020-11-09: qty 1

## 2020-11-09 MED ORDER — METOPROLOL TARTRATE 25 MG PO TABS
25.0000 mg | ORAL_TABLET | Freq: Four times a day (QID) | ORAL | Status: DC
  Administered 2020-11-09 – 2020-11-11 (×8): 25 mg via ORAL
  Filled 2020-11-09 (×8): qty 1

## 2020-11-09 NOTE — ED Provider Notes (Signed)
[facility] DEPT Provider Note   CSN: [id number] Arrival date & time: 11/09/20  1659     History Chief Complaint  Patient presents with  . Abdominal Pain    Robin Guerrero is a 34 y.o. female.  34 year old female presents with epigastric pain which radiates to her back x1 day.  Seen here 2 days ago with similar symptoms now.  Does have a history of pancreatitis notes recent increase alcohol use but that was several days ago.  Denies any current use of alcohol.  Some nausea but no vomiting.  No fever chills pulmonary symptoms.  Denies any black bloody stools.  No vaginal discharge.  Symptoms unresponsive to events described here.-Asymptomatic.        Past Medical History:  Diagnosis Date  . History of anemia    no current med.  . Hypertension    states under control with med., has been on med. x 3 yr.    Patient Active Problem List   Diagnosis Date Noted  . Hypocalcemia 08/19/2020  . Acute alcoholic pancreatitis 09/32/6712  . Thrombocytopenia ([place]) 08/19/2020  . Normocytic anemia 08/19/2020  . Alcoholic pancreatitis 45/80/9983  . SIRS (systemic inflammatory response syndrome) ([place]) 08/18/2020  . Hypokalemia 08/18/2020  . Hypomagnesemia 08/18/2020  . Prolonged QT interval 08/18/2020  . Essential hypertension 08/18/2020  . Depression 08/18/2020  . Tobacco use 08/18/2020    Past Surgical History:  Procedure Laterality Date  . FOREIGN BODY REMOVAL Left 02/10/2015   Procedure: ATTEMPTED EXPLANTATION OF BIRTH CONTROL DEVICE LEFT ARM;  Surgeon: Johnathan Hausen, MD;  Location: [facility];  Service: General;  Laterality: Left;  . FOREIGN BODY REMOVAL Left 05/12/2015   Procedure: REMOVAL OF LEFT ARM BIRTH CONTROL DEVICE;  Surgeon: Johnathan Hausen, MD;  Location: [facility];  Service: General;  Laterality: Left;  . SUBMANDIBULAR GLAND EXCISION Right 05/01/2009     OB History    Gravida  4   Para  2   Term  2     Preterm      AB  2   Living  2     SAB      TAB  2   Ectopic      Multiple      Live Births  2           Family History  Problem Relation Age of Onset  . Breast cancer Mother 67    Social History   Tobacco Use  . Smoking status: Current Every Day Smoker    Years: 5.00    Types: Cigarettes  . Smokeless tobacco: Never Used  . Tobacco comment: 1 pack/week  Substance Use Topics  . Alcohol use: Yes    Comment: occasionally  . Drug use: No    Home Medications Prior to Admission medications   Medication Sig Start Date End Date Taking? Authorizing Provider  amLODipine (NORVASC) 5 MG tablet Take 5 mg by mouth daily. 05/25/20   [provider]  dicyclomine (BENTYL) 20 MG tablet Take 1 tablet (20 mg total) by mouth 2 (two) times daily. 11/08/20   Larene Pickett, PA-C  docusate sodium (COLACE) 100 MG capsule Take 1 capsule (100 mg total) by mouth daily as needed. 08/27/20 08/27/21  Lavina Hamman, MD  ferrous sulfate 325 (65 FE) MG tablet Take 1 tablet (325 mg total) by mouth daily. 05/30/20   Henderly, Britni A, PA-C  folic acid (FOLVITE) 1 MG tablet Take 1 tablet (1 mg  total) by mouth daily. 08/28/20   Lavina Hamman, MD  gabapentin (NEURONTIN) 100 MG capsule Take 1 capsule (100 mg total) by mouth 3 (three) times daily. 08/27/20   Lavina Hamman, MD  HYDROcodone-acetaminophen (NORCO/VICODIN) 5-325 MG tablet Take 2 tablets by mouth 3 (three) times daily as needed for moderate pain or severe pain. Patient not taking: Reported on 10/30/2020 08/27/20 08/27/21  Lavina Hamman, MD  losartan (COZAAR) 100 MG tablet Take 1 tablet (100 mg total) by mouth daily. 08/28/20   Lavina Hamman, MD  megestrol (MEGACE) 40 MG tablet Take 1 tablet (40 mg total) by mouth 2 (two) times daily. Can increase to two tablets twice a day in the event of heavy bleeding 10/30/20   Constant, Peggy, MD  metoprolol tartrate 75 MG TABS Take 50 mg by mouth 2 (two) times daily. 08/27/20   Lavina Hamman, MD  nicotine (NICODERM CQ - DOSED IN MG/24 HOURS) 14 mg/24hr patch Place 1 patch (14 mg total) onto the skin daily. Patient not taking: Reported on 10/30/2020 08/28/20   Lavina Hamman, MD  ondansetron (ZOFRAN ODT) 4 MG disintegrating tablet Take 1 tablet (4 mg total) by mouth every 8 (eight) hours as needed for nausea. 11/08/20   Larene Pickett, PA-C  pantoprazole (PROTONIX) 40 MG tablet Take 1 tablet (40 mg total) by mouth daily. 08/27/20   Lavina Hamman, MD  simethicone (MYLICON) 80 MG chewable tablet Chew 1 tablet (80 mg total) by mouth 4 (four) times daily as needed for flatulence. Patient not taking: Reported on 10/30/2020 08/27/20   Lavina Hamman, MD  thiamine 100 MG tablet Take 1 tablet (100 mg total) by mouth daily. 08/28/20   Lavina Hamman, MD    Allergies    Patient has no known allergies.  Review of Systems   Review of Systems  All other systems reviewed and are negative.   Physical Exam Updated Vital Signs BP (!) 182/122   Pulse (!) 106   Temp 98.9 F (37.2 C) (Oral)   Resp 19   Ht 1.753 m (5\' 9" )   Wt 108.9 kg   SpO2 100%   BMI 35.44 kg/m   Physical Exam Vitals and nursing note reviewed.  Constitutional:      General: She is not in acute distress.    Appearance: Normal appearance. She is well-developed. She is not toxic-appearing.  HENT:     Head: Normocephalic and atraumatic.  Eyes:     General: Lids are normal.     Conjunctiva/sclera: Conjunctivae normal.     Pupils: Pupils are equal, round, and reactive to light.  Neck:     Thyroid: No thyroid mass.     Trachea: No tracheal deviation.  Cardiovascular:     Rate and Rhythm: Normal rate and regular rhythm.     Heart sounds: Normal heart sounds. No murmur heard.  No gallop.   Pulmonary:     Effort: Pulmonary effort is normal. No respiratory distress.     Breath sounds: Normal breath sounds. No stridor. No decreased breath sounds, wheezing, rhonchi or rales.  Abdominal:     General: Bowel  sounds are normal. There is no distension.     Palpations: Abdomen is soft.     Tenderness: There is abdominal tenderness in the epigastric area. There is guarding. There is no rebound.    Musculoskeletal:        General: No tenderness. Normal range of motion.  Cervical back: Normal range of motion and neck supple.  Skin:    General: Skin is warm and dry.     Findings: No abrasion or rash.  Neurological:     Mental Status: She is alert and oriented to person, place, and time.     GCS: GCS eye subscore is 4. GCS verbal subscore is 5. GCS motor subscore is 6.     Cranial Nerves: No cranial nerve deficit.     Sensory: No sensory deficit.  Psychiatric:        Speech: Speech normal.        Behavior: Behavior normal.     ED Results / Procedures / Treatments   Labs (all labs ordered are listed, but only abnormal results are displayed) Labs Reviewed  CBC WITH DIFFERENTIAL/PLATELET  COMPREHENSIVE METABOLIC PANEL  LIPASE, BLOOD  I-STAT BETA HCG BLOOD, ED ([facility], [facility], [facility] ONLY)    EKG None  Radiology No results found.  Procedures Procedures (including critical care time)  Medications Ordered in ED Medications  0.9 %  sodium chloride infusion (has no administration in time range)  sodium chloride 0.9 % bolus 1,000 mL (has no administration in time range)  HYDROmorphone (DILAUDID) injection 1 mg (has no administration in time range)  LORazepam (ATIVAN) injection 0.5 mg (has no administration in time range)    ED Course  I have reviewed the triage vital signs and the nursing notes.  Pertinent labs & imaging results that were available during my care of the patient were reviewed by me and considered in my medical decision making (see chart for details).    MDM Rules/Calculators/A&P                          Patient medicated for pain on arrival here.  Labs reviewed and she does have mild hypokalemia that was replenished with IV potassium.  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  Case discussed with vascular surgeon, Dr. Luan Pulling who requested patient be sent to [facility].  Discussed with Dr. Sabra Heck who has accepted the patient.  Patient immediately started on esmolol drip will titrate to heart rate of less than 80 and blood pressure systolic of 801.  CRITICAL CARE Performed by: Leota Jacobsen Total critical care time: 50 minutes Critical care time was exclusive of separately billable procedures and treating other patients. Critical care was necessary to treat or prevent imminent or life-threatening deterioration. Critical care was time spent personally by me on the following activities: development of treatment plan with patient and/or surrogate as well as nursing, discussions with consultants, evaluation of patient's response to treatment, examination of patient, obtaining history from patient or surrogate, ordering and performing treatments and interventions, ordering and review of laboratory studies, ordering and review of radiographic studies, pulse oximetry and re-evaluation of patient's condition.  Final Clinical Impression(s) / ED Diagnoses Final diagnoses:  None    Rx / DC Orders ED Discharge Orders    None       Lacretia Leigh, MD 11/09/20 1954

## 2020-11-09 NOTE — ED Triage Notes (Signed)
Pt presents to ED from [facility]. Pt c/o same. Transferred here to be seen by surgery

## 2020-11-09 NOTE — ED Notes (Signed)
Pt sister called (Dr. Yong Channel) for an update. Please call the sister back at [phone].

## 2020-11-09 NOTE — ED Notes (Signed)
CCM at bedside 

## 2020-11-09 NOTE — ED Triage Notes (Signed)
Generalized abd pain radiating to lower back since Tuesday associated with nausea.

## 2020-11-09 NOTE — ED Provider Notes (Signed)
Patient transferred from [facility].  Blood pressure is improved though still quite high.  Will titrate up on the esmolol.  I discussed with Dr. Stanford Breed, who asked for heart rate of 80 and blood pressure of 120 and medical admission to the ICU.  No acute surgical intervention tonight.  ICU consulted for admission.  Will give IV Dilaudid for pain control which hopefully will help with blood pressure as well.  CTA of chest/abdomen/pelvis was personally reviewed and shows type B dissection.   Sherwood Gambler, MD 11/09/20 2105

## 2020-11-09 NOTE — ED Notes (Signed)
[facility] en route.  °

## 2020-11-09 NOTE — Progress Notes (Signed)
[facility] Physician-Brief Progress Note Patient Name: Robin Guerrero DOB: Aug 11, 1986 MRN: [id number]   Date of Service  11/09/2020  HPI/Events of Note  Patient is asking for food. Heart rate is in the 90's on max infusion rate of 300 mcg of Esmolol.  eICU Interventions  Patient to remain NPO except for medications overnight secondary to acute pancreatitis and possible need for emergent operation if patient extends her dissection, or develops a leak. Bedside RN asked to check with vascular surgery if heart rate in the 90's is acceptable to them.        [name] Robin Guerrero 11/09/2020, 11:56 PM

## 2020-11-09 NOTE — Consult Note (Signed)
ASSESSMENT & PLAN:  34 y.o. female with acute Stanford Type B aortic dissection. Dissection extends from the origin of the subclavian artery to the right common iliac artery. There is no evidence of malperfusion. The right renal artery is fed via the false lumen, but appears perfused. She has no features of impending rupture clinically or on CTA.   Will plan to manage medically. Strict anti-impulse control with HR < 80 and SBP < 120.  Will need ICU admission, arterial line. Will plan to start aggressive oral antihypertensives once stable on drips. OK for diet.   I counseled her on the natural history and typical course of a type B dissection. I explained urgent indications for intervention. I explained the need for lifelong surveillance with or without intervention. I updated her brother - Robin Guerrero - a physician by phone.  Appreciate PCCM assistance.  CHIEF COMPLAINT:   Abdominal pain  HISTORY:  HISTORY OF PRESENT ILLNESS: Robin Guerrero is a 34 y.o. female who presents to [facility] ER as transfer from [facility] for evaluation of abdominal pain. She developed a "back strain" on Tuesday 11/07/20 located between her scapulae. This progressed to substernal chest discomfort. Then settled as abdominal pain. She was evaluated in the ER 11/23. No cause was found and she was discharged. The pain persisted and she represented to care. CT A/P demonstrated a dissection. CTA C/A/P confirmed TBAD.   On my exam, the patient reports abdominal discomfort and nausea. She has not vomited, but has taken zofran for nausea since symptoms began. She has no lower extremity pain or weakness.     Past Medical History:  Diagnosis Date  . History of anemia    no current med.  . Hypertension    states under control with med., has been on med. x 3 yr.    Past Surgical History:  Procedure Laterality Date  . FOREIGN BODY REMOVAL Left 02/10/2015   Procedure: ATTEMPTED EXPLANTATION OF BIRTH CONTROL DEVICE LEFT  ARM;  Surgeon: Johnathan Hausen, MD;  Location: [place];  Service: General;  Laterality: Left;  . FOREIGN BODY REMOVAL Left 05/12/2015   Procedure: REMOVAL OF LEFT ARM BIRTH CONTROL DEVICE;  Surgeon: Johnathan Hausen, MD;  Location: [place];  Service: General;  Laterality: Left;  . SUBMANDIBULAR GLAND EXCISION Right 05/01/2009    Family History  Problem Relation Age of Onset  . Breast cancer Mother 69    Social History   Socioeconomic History  . Marital status: Single    Spouse name: Not on file  . Number of children: Not on file  . Years of education: Not on file  . Highest education level: Not on file  Occupational History  . Not on file  Tobacco Use  . Smoking status: Current Every Day Smoker    Years: 5.00    Types: Cigarettes  . Smokeless tobacco: Never Used  . Tobacco comment: 1 pack/week  Substance and Sexual Activity  . Alcohol use: Yes    Comment: occasionally  . Drug use: No  . Sexual activity: Not Currently  Other Topics Concern  . Not on file  Social History Narrative  . Not on file   Social Determinants of Health   Financial Resource Strain:   . Difficulty of Paying Living Expenses: Not on file  Food Insecurity:   . Worried About Charity fundraiser in the Last Year: Not on file  . Ran Out of Food in the Last Year: Not  on file  Transportation Needs:   . Lack of Transportation (Medical): Not on file  . Lack of Transportation (Non-Medical): Not on file  Physical Activity:   . Days of Exercise per Week: Not on file  . Minutes of Exercise per Session: Not on file  Stress:   . Feeling of Stress : Not on file  Social Connections:   . Frequency of Communication with Friends and Family: Not on file  . Frequency of Social Gatherings with Friends and Family: Not on file  . Attends Religious Services: Not on file  . Active Member of Clubs or Organizations: Not on file  . Attends Archivist Meetings: Not on file  .  Marital Status: Not on file  Intimate Partner Violence:   . Fear of Current or Ex-Partner: Not on file  . Emotionally Abused: Not on file  . Physically Abused: Not on file  . Sexually Abused: Not on file    No Known Allergies  Current Facility-Administered Medications  Medication Dose Route Frequency Provider Last Rate Last Admin  . esmolol (BREVIBLOC) 2000 mg / 100 mL (20 mg/mL) infusion  25-300 mcg/kg/min Intravenous Continuous Lacretia Leigh, MD 89.8 mL/hr at 11/09/20 2102 275 mcg/kg/min at 11/09/20 2102  . fentaNYL (SUBLIMAZE) injection 25-100 mcg  25-100 mcg Intravenous Q2H PRN Desai, Rahul P, PA-C      . sodium chloride (PF) 0.9 % injection            Current Outpatient Medications  Medication Sig Dispense Refill  . acetaminophen (TYLENOL) 325 MG tablet Take 650 mg by mouth every 6 (six) hours as needed for mild pain, fever or headache.    Marland Kitchen amLODipine (NORVASC) 5 MG tablet Take 5 mg by mouth daily.    Marland Kitchen dicyclomine (BENTYL) 20 MG tablet Take 1 tablet (20 mg total) by mouth 2 (two) times daily. 20 tablet 0  . ferrous sulfate 325 (65 FE) MG tablet Take 1 tablet (325 mg total) by mouth daily. 30 tablet 0  . folic acid (FOLVITE) 1 MG tablet Take 1 tablet (1 mg total) by mouth daily. 30 tablet 0  . losartan (COZAAR) 100 MG tablet Take 1 tablet (100 mg total) by mouth daily. 30 tablet 0  . megestrol (MEGACE) 40 MG tablet Take 1 tablet (40 mg total) by mouth 2 (two) times daily. Can increase to two tablets twice a day in the event of heavy bleeding (Patient taking differently: Take 40 mg by mouth See admin instructions. 40mg  twice daily during cycle can take 80mg  twice daily if heavy bleeding) 60 tablet 5  . metoprolol tartrate 75 MG TABS Take 50 mg by mouth 2 (two) times daily. (Patient taking differently: Take 75 mg by mouth 2 (two) times daily. ) 120 tablet 0  . ondansetron (ZOFRAN ODT) 4 MG disintegrating tablet Take 1 tablet (4 mg total) by mouth every 8 (eight) hours as needed for  nausea. 10 tablet 0  . docusate sodium (COLACE) 100 MG capsule Take 1 capsule (100 mg total) by mouth daily as needed. (Patient not taking: Reported on 11/09/2020) 30 capsule 0  . gabapentin (NEURONTIN) 100 MG capsule Take 1 capsule (100 mg total) by mouth 3 (three) times daily. (Patient not taking: Reported on 11/09/2020) 60 capsule 0  . HYDROcodone-acetaminophen (NORCO/VICODIN) 5-325 MG tablet Take 2 tablets by mouth 3 (three) times daily as needed for moderate pain or severe pain. (Patient not taking: Reported on 10/30/2020) 15 tablet 0  . nicotine (NICODERM CQ - DOSED  IN MG/24 HOURS) 14 mg/24hr patch Place 1 patch (14 mg total) onto the skin daily. (Patient not taking: Reported on 10/30/2020) 28 patch 0  . pantoprazole (PROTONIX) 40 MG tablet Take 1 tablet (40 mg total) by mouth daily. (Patient not taking: Reported on 11/09/2020) 60 tablet 0  . simethicone (MYLICON) 80 MG chewable tablet Chew 1 tablet (80 mg total) by mouth 4 (four) times daily as needed for flatulence. (Patient not taking: Reported on 10/30/2020) 30 tablet 0  . thiamine 100 MG tablet Take 1 tablet (100 mg total) by mouth daily. (Patient not taking: Reported on 11/09/2020) 30 tablet 0    REVIEW OF SYSTEMS:  [X]  denotes positive finding, [ ]  denotes negative finding Cardiac  Comments:  Chest pain or chest pressure: x   Shortness of breath upon exertion:    Short of breath when lying flat:    Irregular heart rhythm:        Vascular    Pain in calf, thigh, or hip brought on by ambulation:    Pain in feet at night that wakes you up from your sleep:     Blood clot in your veins:    Leg swelling:         Pulmonary    Oxygen at home:    Productive cough:     Wheezing:         Neurologic    Sudden weakness in arms or legs:     Sudden numbness in arms or legs:     Sudden onset of difficulty speaking or slurred speech:    Temporary loss of vision in one eye:     Problems with dizziness:         Gastrointestinal      Blood in stool:     Vomited blood:         Genitourinary    Burning when urinating:     Blood in urine:        Psychiatric    Major depression:         Hematologic    Bleeding problems:    Problems with blood clotting too easily:        Skin    Rashes or ulcers:        Constitutional    Fever or chills:     PHYSICAL EXAM:   Vitals:   11/09/20 2051 11/09/20 2054 11/09/20 2057 11/09/20 2100  BP: (!) 154/119 (!) 154/109 (!) 143/110 (!) 154/116  Pulse: (!) 102 (!) 101 98 97  Resp: (!) 22 (!) 22 19 (!) 21  Temp:      TempSrc:      SpO2: 95% 96% 98% 96%  Weight:      Height:       Constitutional: Well appearing in no distress. Appears well nourished.  Neurologic: Normal gait and station. CN intact. No weakness. No sensory loss. Psychiatric: Mood and affect symmetric and appropriate. Eyes: No icterus. No conjunctival pallor. Ears, nose, throat: mucous membranes moist. Midline trachea. No carotid bruit. Cardiac: regular rate and rhythm.  Respiratory: unlabored. Abdominal: soft, non-tender, non-distended. No palpable pulsatile abdominal mass. Peripheral vascular:  Radial pulse: L 2+ / R 2+  Dorsalis pedis pulse: L 2+ / R 2+ Extremity: No edema. No cyanosis. No pallor.  Skin: No gangrene. No ulceration.  Lymphatic: No Stemmer's sign. No palpable lymphadenopathy.   DATA REVIEW:    Most recent CBC CBC Latest Ref Rng & Units 11/09/2020 11/07/2020 08/27/2020  WBC 4.0 -  10.5 K/uL 8.5 10.6(H) -  Hemoglobin 12.0 - 15.0 g/dL 9.5(L) 9.5(L) 7.8(L)  Hematocrit 36 - 46 % 31.9(L) 31.5(L) 25.3(L)  Platelets 150 - 400 K/uL 191 211 -     Most recent CMP CMP Latest Ref Rng & Units 11/09/2020 11/07/2020 08/27/2020  Glucose 70 - 99 mg/dL 104(H) 146(H) 91  BUN 6 - 20 mg/dL 9 7 6   Creatinine 0.44 - 1.00 mg/dL 0.81 0.78 0.67  Sodium 135 - 145 mmol/L 134(L) 138 139  Potassium 3.5 - 5.1 mmol/L 3.0(L) 2.8(L) 3.3(L)  Chloride 98 - 111 mmol/L 99 104 103  CO2 22 - 32 mmol/L 24 21(L) 25   Calcium 8.9 - 10.3 mg/dL 9.1 9.0 8.8(L)  Total Protein 6.5 - 8.1 g/dL 7.8 7.7 6.8  Total Bilirubin 0.3 - 1.2 mg/dL 2.1(H) 1.2 1.4(H)  Alkaline Phos 38 - 126 U/L 48 46 66  AST 15 - 41 U/L 15 22 38  ALT 0 - 44 U/L 14 16 31     Renal function Estimated Creatinine Clearance: 128.7 mL/min (by C-G formula based on SCr of 0.81 mg/dL).  No results found for: HGBA1C  No results found for: LDLCALC, LDLC, HIRISKLDL, POCLDL, LDLDIRECT, REALLDLC, TOTLDLC   Vascular Imaging: CTA personally reviewed in detail. acute Stanford Type B aortic dissection. Dissection extends from the origin of the subclavian artery to the right common iliac artery. There is no evidence of malperfusion. The right renal artery is fed via the false lumen, but appears perfused. She has no features of impending rupture clinically or on CTA.    [name]. Stanford Breed, MD [facility] Phone Number: [phone] 11/09/2020 9:03 PM

## 2020-11-09 NOTE — H&P (Signed)
NAME:  Robin Guerrero, MRN:  [id number], DOB:  10-Jul-1986, LOS: 0 ADMISSION DATE:  11/09/2020, CONSULTATION DATE:  11/09/20 REFERRING MD:  Regenia Skeeter  CHIEF COMPLAINT:  Chest, back, abd pain   Brief History   Robin Guerrero is a 34 y.o. female who was admitted 11/25 with large type B aortic dissection.  History of present illness   Robin Guerrero is a 34 y.o. female who has a PMH including but not limited to HTN, Alcoholic pancreatitis, depression, tobacco dependence (see "past medical history" for rest).  She was admitted 11/25 with large type B aortic dissection involving the entire abdominal aorta and possibly through to the origin of the right common femoral artery.  She initially presented to [facility] ED 11/23 with N/V, abd pain.  She was hypertensive and it was felt this was due to her missing antihypertensive meds due to vomiting.  She was discharged with bentyl, zofran and instructed to follow up with PCP.  11/24 and 11/25 she had ongoing pain which worsened 11/25 and radiated lower into her back prompting her to return to [facility] ED.  She had CT abdomen which revealed a descending thoracic aortic dissection.  Case was discussed with Dr. Luan Pulling of vascular surgery who recommended transfer to [facility] and tight BP control for the night.  Past Medical History  has Alcoholic pancreatitis; SIRS (systemic inflammatory response syndrome) ([place]); Hypokalemia; Hypomagnesemia; Prolonged QT interval; Essential hypertension; Depression; Tobacco use; Hypocalcemia; Acute alcoholic pancreatitis; Thrombocytopenia ([place]); Normocytic anemia; and Aortic dissection (HCC) on their problem list.  New Home Hospital Events   11/25 > admit.  Consults:  Vascular surgery.  Procedures:  Art line pending 11/25 >   Significant Diagnostic Tests:  CT A / P 11/25 > descending thoracic aorta dissection, trace left pleural effusion, trace haziness surrounding the uncinate process of pancreas. CTA chest 11/25 > type B  thoracic aortic dissection involving the entire abdominal aorta and possibly through to the origin of the right common femoral artery, possible mild acute pancreatitis.  Micro Data:  Flu 11/25 > neg. COVID 11/25 > neg.  Antimicrobials:  None.   Interim history/subjective:  Slight improvement in pain but still persists. SBP 150's on 278mcg/kg/min esmolol.  Objective:  Blood pressure (!) 144/102, pulse 98, temperature 98.9 F (37.2 C), temperature source Oral, resp. rate (!) 23, height 5\' 9"  (1.753 m), weight 108.9 kg, SpO2 94 %.       No intake or output data in the 24 hours ending 11/09/20 2120 Filed Weights   11/09/20 1710  Weight: 108.9 kg    Examination: General: Obese female, in NAD. Neuro: A&O x 3, no deficits. HEENT: [place]/AT. Sclerae anicteric.  EOMI. Cardiovascular: Tachy, regular, no M/R/G.  Lungs: Respirations even and unlabored.  CTA bilaterally, No W/R/R.  Abdomen: Obese. BS x 4, soft, NT/ND.  Musculoskeletal: No gross deformities, no edema.  Skin: Intact, warm, no rashes.  Assessment & Plan:   Type B aortic dissection - presumed 2/2 uncontrolled HTN. - Continue esmolol gtt for goal SBP 120 and goal HR 80 per vascular surgery. - Add PRN hydralazine for goal SBP 120. - Add scheduled metoprolol 25mg  q6hrs for goal HR. - Pain control with fentanyl PRN.  Hypertensive emergency with likely uncontrolled HTN at baseline. - Esmolol, metoprolol and hydralazine as above. - Hold home amlodipine, losartan, metoprolol. - Will likely need additional home antihypertensives prior to d/c along with strict outpatient follow up.  Nausea - 2/2 above. - Zofran PRN.  Possible mild pancreatitis on CT (lipase  125). - Supportive care. - NPO for now.  Hypokalemia. - 40 mEq K. - Follow BMP.  Hx depression, EtOH use, tobacco use. - Continue home gabapentin, thiamine, folate, nicotine patch.  Best Practice:  Diet: NPO for now. Pain/Anxiety/Delirium protocol (if indicated):  Fentanyl PRN. VAP protocol (if indicated): N/A. DVT prophylaxis: SCD's only. GI prophylaxis: None. Glucose control: None. Mobility: Bedrest. Last date of multidisciplinary goals of care discussion: None. Family and staff present: N/A. Summary of discussion: N/A. Follow up goals of care discussion due: 12/1. Code Status: Full. Family Communication: None. Disposition: ICU.  Labs   CBC: Recent Labs  Lab 11/07/20 2240 11/09/20 1733  WBC 10.6* 8.5  NEUTROABS 8.7* 6.9  HGB 9.5* 9.5*  HCT 31.5* 31.9*  MCV 78.8* 80.2  PLT 211 426   Basic Metabolic Panel: Recent Labs  Lab 11/07/20 2240 11/09/20 1733  NA 138 134*  K 2.8* 3.0*  CL 104 99  CO2 21* 24  GLUCOSE 146* 104*  BUN 7 9  CREATININE 0.78 0.81  CALCIUM 9.0 9.1  MG 1.4*  --    GFR: Estimated Creatinine Clearance: 128.7 mL/min (by C-G formula based on SCr of 0.81 mg/dL). Recent Labs  Lab 11/07/20 2240 11/09/20 1733  WBC 10.6* 8.5   Liver Function Tests: Recent Labs  Lab 11/07/20 2240 11/09/20 1733  AST 22 15  ALT 16 14  ALKPHOS 46 48  BILITOT 1.2 2.1*  PROT 7.7 7.8  ALBUMIN 3.9 3.7   Recent Labs  Lab 11/07/20 2240 11/09/20 1733  LIPASE 28 125*   No results for input(s): AMMONIA in the last 168 hours. ABG    Component Value Date/Time   TCO2 25 08/09/2013 1333    Coagulation Profile: No results for input(s): INR, PROTIME in the last 168 hours. Cardiac Enzymes: No results for input(s): CKTOTAL, CKMB, CKMBINDEX, TROPONINI in the last 168 hours. HbA1C: No results found for: HGBA1C CBG: No results for input(s): GLUCAP in the last 168 hours.  Review of Systems:   All negative; except for those that are bolded, which indicate positives.  Constitutional: weight loss, weight gain, night sweats, fevers, chills, fatigue, weakness.  HEENT: headaches, sore throat, sneezing, nasal congestion, post nasal drip, difficulty swallowing, tooth/dental problems, visual complaints, visual changes, ear  aches. Neuro: difficulty with speech, weakness, numbness, ataxia. CV:  chest pain, orthopnea, PND, swelling in lower extremities, dizziness, palpitations, syncope.  Resp: cough, hemoptysis, dyspnea, wheezing. GI: heartburn, indigestion, abdominal pain, nausea, vomiting, diarrhea, constipation, change in bowel habits, loss of appetite, hematemesis, melena, hematochezia.  GU: dysuria, change in color of urine, urgency or frequency, flank pain, hematuria. MSK: joint pain or swelling, decreased range of motion. Psych: change in mood or affect, depression, anxiety, suicidal ideations, homicidal ideations. Skin: rash, itching, bruising.   Past medical history  She,  has a past medical history of History of anemia and Hypertension.   Surgical History    Past Surgical History:  Procedure Laterality Date  . FOREIGN BODY REMOVAL Left 02/10/2015   Procedure: ATTEMPTED EXPLANTATION OF BIRTH CONTROL DEVICE LEFT ARM;  Surgeon: Johnathan Hausen, MD;  Location: [place];  Service: General;  Laterality: Left;  . FOREIGN BODY REMOVAL Left 05/12/2015   Procedure: REMOVAL OF LEFT ARM BIRTH CONTROL DEVICE;  Surgeon: Johnathan Hausen, MD;  Location: [place];  Service: General;  Laterality: Left;  . SUBMANDIBULAR GLAND EXCISION Right 05/01/2009     Social History   reports that she has been smoking cigarettes. She has  smoked for the past 5.00 years. She has never used smokeless tobacco. She reports current alcohol use. She reports that she does not use drugs.   Family history   Her family history includes Breast cancer (age of onset: 63) in her mother.   Allergies No Known Allergies   Home meds  Prior to Admission medications   Medication Sig Start Date End Date Taking? Authorizing Provider  acetaminophen (TYLENOL) 325 MG tablet Take 650 mg by mouth every 6 (six) hours as needed for mild pain, fever or headache.   Yes [provider]  amLODipine (NORVASC) 5 MG  tablet Take 5 mg by mouth daily. 05/25/20  Yes [provider]  dicyclomine (BENTYL) 20 MG tablet Take 1 tablet (20 mg total) by mouth 2 (two) times daily. 11/08/20  Yes Larene Pickett, PA-C  ferrous sulfate 325 (65 FE) MG tablet Take 1 tablet (325 mg total) by mouth daily. 05/30/20  Yes Henderly, Britni A, PA-C  folic acid (FOLVITE) 1 MG tablet Take 1 tablet (1 mg total) by mouth daily. 08/28/20  Yes Lavina Hamman, MD  losartan (COZAAR) 100 MG tablet Take 1 tablet (100 mg total) by mouth daily. 08/28/20  Yes Lavina Hamman, MD  megestrol (MEGACE) 40 MG tablet Take 1 tablet (40 mg total) by mouth 2 (two) times daily. Can increase to two tablets twice a day in the event of heavy bleeding Patient taking differently: Take 40 mg by mouth See admin instructions. 40mg  twice daily during cycle can take 80mg  twice daily if heavy bleeding 10/30/20  Yes Constant, Peggy, MD  metoprolol tartrate 75 MG TABS Take 50 mg by mouth 2 (two) times daily. Patient taking differently: Take 75 mg by mouth 2 (two) times daily.  08/27/20  Yes Lavina Hamman, MD  ondansetron (ZOFRAN ODT) 4 MG disintegrating tablet Take 1 tablet (4 mg total) by mouth every 8 (eight) hours as needed for nausea. 11/08/20  Yes Larene Pickett, PA-C  docusate sodium (COLACE) 100 MG capsule Take 1 capsule (100 mg total) by mouth daily as needed. Patient not taking: Reported on 11/09/2020 08/27/20 08/27/21  Lavina Hamman, MD  gabapentin (NEURONTIN) 100 MG capsule Take 1 capsule (100 mg total) by mouth 3 (three) times daily. Patient not taking: Reported on 11/09/2020 08/27/20   Lavina Hamman, MD  HYDROcodone-acetaminophen (NORCO/VICODIN) 5-325 MG tablet Take 2 tablets by mouth 3 (three) times daily as needed for moderate pain or severe pain. Patient not taking: Reported on 10/30/2020 08/27/20 08/27/21  Lavina Hamman, MD  nicotine (NICODERM CQ - DOSED IN MG/24 HOURS) 14 mg/24hr patch Place 1 patch (14 mg total) onto the skin daily. Patient  not taking: Reported on 10/30/2020 08/28/20   Lavina Hamman, MD  pantoprazole (PROTONIX) 40 MG tablet Take 1 tablet (40 mg total) by mouth daily. Patient not taking: Reported on 11/09/2020 08/27/20   Lavina Hamman, MD  simethicone (MYLICON) 80 MG chewable tablet Chew 1 tablet (80 mg total) by mouth 4 (four) times daily as needed for flatulence. Patient not taking: Reported on 10/30/2020 08/27/20   Lavina Hamman, MD  thiamine 100 MG tablet Take 1 tablet (100 mg total) by mouth daily. Patient not taking: Reported on 11/09/2020 08/28/20   Lavina Hamman, MD    Critical care time: 40 min.    Montey Hora, [facility] 11/09/2020, 9:20 PM

## 2020-11-09 NOTE — Progress Notes (Signed)
eLink Physician-Brief Progress Note Patient Name: Robin Guerrero DOB: Nov 11, 1986 MRN: [id number]   Date of Service  11/09/2020  HPI/Events of Note  Patient admitted with with Type B aortic dissection and pancreatitis.  eICU Interventions  New Patient Evaluation completed.        [name] 11/09/2020, 11:17 PM

## 2020-11-10 ENCOUNTER — Inpatient Hospital Stay (HOSPITAL_COMMUNITY): Payer: Medicaid Other

## 2020-11-10 LAB — BASIC METABOLIC PANEL
Anion gap: 10 (ref 5–15)
BUN: 8 mg/dL (ref 6–20)
CO2: 20 mmol/L — ABNORMAL LOW (ref 22–32)
Calcium: 8.8 mg/dL — ABNORMAL LOW (ref 8.9–10.3)
Chloride: 102 mmol/L (ref 98–111)
Creatinine, Ser: 0.78 mg/dL (ref 0.44–1.00)
GFR, Estimated: >60 mL/min (ref >60)
Glucose, Bld: 96 mg/dL (ref 70–99)
Potassium: 3.5 mmol/L (ref 3.5–5.1)
Sodium: 132 mmol/L — ABNORMAL LOW (ref 135–145)

## 2020-11-10 LAB — CBC
HCT: 28.4 % — ABNORMAL LOW (ref 36.0–46.0)
Hemoglobin: 8.5 g/dL — ABNORMAL LOW (ref 12.0–15.0)
MCH: 23.5 pg — ABNORMAL LOW (ref 26.0–34.0)
MCHC: 29.9 g/dL — ABNORMAL LOW (ref 30.0–36.0)
MCV: 78.5 fL — ABNORMAL LOW (ref 80.0–100.0)
Platelets: 194 10*3/uL (ref 150–400)
RBC: 3.62 MIL/uL — ABNORMAL LOW (ref 3.87–5.11)
RDW: 18.8 % — ABNORMAL HIGH (ref 11.5–15.5)
WBC: 8.5 10*3/uL (ref 4.0–10.5)
nRBC: 0 % (ref 0.0–0.2)

## 2020-11-10 LAB — MAGNESIUM: Magnesium: 1.8 mg/dL (ref 1.7–2.4)

## 2020-11-10 LAB — PHOSPHORUS: Phosphorus: 3.5 mg/dL (ref 2.5–4.6)

## 2020-11-10 LAB — MRSA PCR SCREENING: MRSA by PCR: NEGATIVE

## 2020-11-10 MED ORDER — FERROUS SULFATE 325 (65 FE) MG PO TABS
325.0000 mg | ORAL_TABLET | Freq: Every day | ORAL | Status: DC
  Administered 2020-11-10 – 2020-11-18 (×9): 325 mg via ORAL
  Filled 2020-11-10 (×9): qty 1

## 2020-11-10 MED ORDER — DEXTROSE 5 % IV SOLN: INTRAVENOUS | Status: DC

## 2020-11-10 MED ORDER — CHLORHEXIDINE GLUCONATE CLOTH 2 % EX PADS
6.0000 | MEDICATED_PAD | Freq: Every day | CUTANEOUS | Status: DC
  Administered 2020-11-10 – 2020-11-18 (×9): 6 via TOPICAL

## 2020-11-10 MED ORDER — LOSARTAN POTASSIUM 50 MG PO TABS
100.0000 mg | ORAL_TABLET | Freq: Every day | ORAL | Status: DC
  Administered 2020-11-10 – 2020-11-18 (×9): 100 mg via ORAL
  Filled 2020-11-10 (×9): qty 2

## 2020-11-10 MED ORDER — DICYCLOMINE HCL 20 MG PO TABS
20.0000 mg | ORAL_TABLET | Freq: Two times a day (BID) | ORAL | Status: DC
  Administered 2020-11-10 – 2020-11-18 (×17): 20 mg via ORAL
  Filled 2020-11-10 (×18): qty 1

## 2020-11-10 MED ORDER — POTASSIUM CHLORIDE CRYS ER 20 MEQ PO TBCR
40.0000 meq | EXTENDED_RELEASE_TABLET | Freq: Once | ORAL | Status: AC
  Administered 2020-11-10: 40 meq via ORAL
  Filled 2020-11-10: qty 2

## 2020-11-10 MED ORDER — AMLODIPINE BESYLATE 5 MG PO TABS
5.0000 mg | ORAL_TABLET | Freq: Every day | ORAL | Status: DC
  Administered 2020-11-10 – 2020-11-11 (×2): 5 mg via ORAL
  Filled 2020-11-10 (×2): qty 1

## 2020-11-10 MED ORDER — ACETAMINOPHEN 325 MG PO TABS
650.0000 mg | ORAL_TABLET | Freq: Four times a day (QID) | ORAL | Status: DC | PRN
  Administered 2020-11-10 – 2020-11-14 (×4): 650 mg via ORAL
  Filled 2020-11-10 (×4): qty 2

## 2020-11-10 MED ORDER — MAGNESIUM SULFATE 2 GM/50ML IV SOLN
2.0000 g | Freq: Once | INTRAVENOUS | Status: AC
  Administered 2020-11-10: 2 g via INTRAVENOUS
  Filled 2020-11-10: qty 50

## 2020-11-10 NOTE — Plan of Care (Signed)
HR goal of < 80 (per vascular surgery note) not met despite esmolol infusion at maximum rate. BP well controlled at < 791 systolic, but HR remaining in the 90's.  Dr. Stanford Breed aware and advised to continue with scheduled PO metoprolol and monitor closely for worsening symptoms. No new orders received.

## 2020-11-10 NOTE — Progress Notes (Signed)
   ASSESSMENT & PLAN:  Robin Guerrero is a 34 y.o. female with type B aortic dissection. No indications for urgent intervention at the moment.   Strict anti-impulse control with HR < 80 and SBP < 120. Needs better rate control. Consider adding diltiazem or increasing metoprolol.   SUBJECTIVE:  HR/BP improved. Pain improved. Still having trouble "getting comfortable." Wants to get up to use the bathroom.  OBJECTIVE:  BP 118/83   Pulse 89   Temp 98.4 F (36.9 C) (Oral)   Resp (!) 24   Ht 5\' 9"  (1.753 m)   Wt 108.9 kg   SpO2 95%   BMI 35.44 kg/m   Intake/Output Summary (Last 24 hours) at 11/10/2020 0729 Last data filed at 11/10/2020 0600 Gross per 24 hour  Intake 997.69 ml  Output 400 ml  Net 597.69 ml     Constitutional: well appearing. no acute distress. Cardiac: RRR. Vascular: 2+ DP/Radials Pulmonary: unlabored Abdominal: soft  CBC Latest Ref Rng & Units 11/09/2020 11/07/2020 08/27/2020  WBC 4.0 - 10.5 K/uL 8.5 10.6(H) -  Hemoglobin 12.0 - 15.0 g/dL 9.5(L) 9.5(L) 7.8(L)  Hematocrit 36 - 46 % 31.9(L) 31.5(L) 25.3(L)  Platelets 150 - 400 K/uL 191 211 -     CMP Latest Ref Rng & Units 11/09/2020 11/07/2020 08/27/2020  Glucose 70 - 99 mg/dL 104(H) 146(H) 91  BUN 6 - 20 mg/dL 9 7 6   Creatinine 0.44 - 1.00 mg/dL 0.81 0.78 0.67  Sodium 135 - 145 mmol/L 134(L) 138 139  Potassium 3.5 - 5.1 mmol/L 3.0(L) 2.8(L) 3.3(L)  Chloride 98 - 111 mmol/L 99 104 103  CO2 22 - 32 mmol/L 24 21(L) 25  Calcium 8.9 - 10.3 mg/dL 9.1 9.0 8.8(L)  Total Protein 6.5 - 8.1 g/dL 7.8 7.7 6.8  Total Bilirubin 0.3 - 1.2 mg/dL 2.1(H) 1.2 1.4(H)  Alkaline Phos 38 - 126 U/L 48 46 66  AST 15 - 41 U/L 15 22 38  ALT 0 - 44 U/L 14 16 31     Estimated Creatinine Clearance: 128.7 mL/min (by C-G formula based on SCr of 0.81 mg/dL).  Robin Guerrero. Stanford Breed, MD [facility] Phone Number: [phone] 11/10/2020 7:29 AM

## 2020-11-10 NOTE — Progress Notes (Signed)
NAME:  Robin Guerrero, MRN:  [id number], DOB:  1986-04-13, LOS: 1 ADMISSION DATE:  11/09/2020, CONSULTATION DATE:  11/09/20 REFERRING MD:  Regenia Skeeter  CHIEF COMPLAINT:  Chest, back, abd pain   Brief History   Robin Guerrero is a 34 y.o. female who was admitted 11/25 with large type B aortic dissection.  History of present illness   Robin Guerrero is a 34 y.o. female who has a PMH including but not limited to HTN, Alcoholic pancreatitis, depression, tobacco dependence (see "past medical history" for rest).  She was admitted 11/25 with large type B aortic dissection involving the entire abdominal aorta and possibly through to the origin of the right common femoral artery.  She initially presented to [facility] ED 11/23 with N/V, abd pain.  She was hypertensive and it was felt this was due to her missing antihypertensive meds due to vomiting.  She was discharged with bentyl, zofran and instructed to follow up with PCP.  11/24 and 11/25 she had ongoing pain which worsened 11/25 and radiated lower into her back prompting her to return to [facility] ED.  She had CT abdomen which revealed a descending thoracic aortic dissection.  Case was discussed with Dr. Luan Pulling of vascular surgery who recommended transfer to [facility] and tight BP control for the night.  Past Medical History  has Alcoholic pancreatitis; SIRS (systemic inflammatory response syndrome) ([place]); Hypokalemia; Hypomagnesemia; Prolonged QT interval; Essential hypertension; Depression; Tobacco use; Hypocalcemia; Acute alcoholic pancreatitis; Thrombocytopenia ([place]); Normocytic anemia; Descending thoracic dissection ([place]); Hypertensive emergency; Nausea; and Back pain on their problem list.  Significant Hospital Events   11/25 > admit.  Consults:  Vascular surgery.  Procedures:  Art line pending 11/25 >   Significant Diagnostic Tests:   CT A / P 11/25 > descending thoracic aorta dissection, trace left pleural effusion, trace haziness surrounding the  uncinate process of pancreas.  CTA chest 11/25 > type B thoracic aortic dissection involving the entire abdominal aorta and possibly through to the origin of the right common femoral artery, possible mild acute pancreatitis.  Micro Data:  Flu 11/25 > neg. COVID 11/25 > neg.  Antimicrobials:  None.   Interim history/subjective:  Patient seen sitting up in bedside recliner in no acute distress Reports continued mild back pain and headache No acute events reported overnight by RN  Objective:  Blood pressure 118/83, pulse 89, temperature 98.4 F (36.9 C), temperature source Oral, resp. rate (!) 24, height 5\' 9"  (1.753 m), weight 108.9 kg, SpO2 95 %.        Intake/Output Summary (Last 24 hours) at 11/10/2020 0719 Last data filed at 11/10/2020 0600 Gross per 24 hour  Intake 997.69 ml  Output 400 ml  Net 597.69 ml   Filed Weights   11/09/20 1710  Weight: 108.9 kg    Examination: General: Well-developed obese well-appearing female sitting in bedside recliner in no acute distress HEENT: [place]/AT MM pink/moist, PERRL,  Neuro: Oriented, strength 5/5 in all extremities, reported numbness or tingling to the lower extremities CV: s1s2 regular rate and rhythm, no murmur, rubs, or gallops,  PULM:  Clear to auscultation bilaterally, no increased work of breathing, no added breath sounds, oxygen saturations appropriate on room air GI: soft, bowel sounds active in all 4 quadrants, non-tender, non-distended Extremities: warm/dry, no edema  Skin: no rashes or lesions  Assessment & Plan:   Type B aortic dissection  - Presumed 2/2 uncontrolled HTN P: Vascular surgery consulted, appreciate assistance  Remains on Esmolol drip with good blood pressure  control, will resume home antihypertensives today with transition off esmolol drip. PRN Hydralazine SBP goal 120, HR 80 per vascular surgery  Continue scheduled Metoprolol  Ensure adequate pain control   Hypertensive emergency with likely  uncontrolled HTN at baseline -Home medications include Norvasc, Bentyl, Cozaar, Metoprolol.  -Will likely need additional home antihypertensives prior to d/c along with strict outpatient follow up. P: Esmolol drip with PRN hydralazine as above  Resume home Norvasc, Lopressor and Cozaar today Continuous telemetry  Nausea  - 2/2 above  P: PRN antiemetics  Reports resolution of nausea with desire for oral consumption this morning  Possible mild pancreatitis on CT (lipase 125). P: Supportive care  Request diet Begin clear liquids with advancement as tolerated  Hypokalemia P: Supplement as needed  Follow Bmet   Hx depression, EtOH use, tobacco use P: Continue home gabapentin, thiamine, folate, and nicotine patch    Best Practice:  Diet: NPO for now. Pain/Anxiety/Delirium protocol (if indicated): Fentanyl PRN. VAP protocol (if indicated): N/A. DVT prophylaxis: SCD's only. GI prophylaxis: None. Glucose control: None. Mobility: Bedrest. Last date of multidisciplinary goals of care discussion: Pending. Family and staff present: Pending. Summary of discussion: Pending. Follow up goals of care discussion due: 12/1. Code Status: Full. Family Communication: Patient updated. Disposition: ICU.  Labs   CBC: Recent Labs  Lab 11/07/20 2240 11/09/20 1733  WBC 10.6* 8.5  NEUTROABS 8.7* 6.9  HGB 9.5* 9.5*  HCT 31.5* 31.9*  MCV 78.8* 80.2  PLT 211 147   Basic Metabolic Panel: Recent Labs  Lab 11/07/20 2240 11/09/20 1733  NA 138 134*  K 2.8* 3.0*  CL 104 99  CO2 21* 24  GLUCOSE 146* 104*  BUN 7 9  CREATININE 0.78 0.81  CALCIUM 9.0 9.1  MG 1.4*  --    GFR: Estimated Creatinine Clearance: 128.7 mL/min (by C-G formula based on SCr of 0.81 mg/dL). Recent Labs  Lab 11/07/20 2240 11/09/20 1733  WBC 10.6* 8.5   Liver Function Tests: Recent Labs  Lab 11/07/20 2240 11/09/20 1733  AST 22 15  ALT 16 14  ALKPHOS 46 48  BILITOT 1.2 2.1*  PROT 7.7 7.8  ALBUMIN  3.9 3.7   Recent Labs  Lab 11/07/20 2240 11/09/20 1733  LIPASE 28 125*   No results for input(s): AMMONIA in the last 168 hours. ABG    Component Value Date/Time   TCO2 25 08/09/2013 1333    Coagulation Profile: No results for input(s): INR, PROTIME in the last 168 hours. Cardiac Enzymes: No results for input(s): CKTOTAL, CKMB, CKMBINDEX, TROPONINI in the last 168 hours. HbA1C: No results found for: HGBA1C CBG: No results for input(s): GLUCAP in the last 168 hours.    Critical care time:    Performed by: Johnsie Cancel  Total critical care time: 37 minutes  Critical care time was exclusive of separately billable procedures and treating other patients.  Critical care was necessary to treat or prevent imminent or life-threatening deterioration.  Critical care was time spent personally by me on the following activities: development of treatment plan with patient and/or surrogate as well as nursing, discussions with consultants, evaluation of patient's response to treatment, examination of patient, obtaining history from patient or surrogate, ordering and performing treatments and interventions, ordering and review of laboratory studies, ordering and review of radiographic studies, pulse oximetry and re-evaluation of patient's condition.  Johnsie Cancel, NP-C [facility] Contact / Pager information can be found on Amion  11/10/2020, 7:32 AM

## 2020-11-10 NOTE — Progress Notes (Signed)
eLink Physician-Brief Progress Note Patient Name: Robin Guerrero DOB: 1986-08-05 MRN: [id number]   Date of Service  11/10/2020  HPI/Events of Note  Patient is NPO.  eICU Interventions  Will start D 5 % water at 50 ml / hour.        [name] [name] 11/10/2020, 12:06 AM

## 2020-11-11 DIAGNOSIS — I7101 Dissection of thoracic aorta: Secondary | ICD-10-CM | POA: Diagnosis not present

## 2020-11-11 LAB — CBC
HCT: 28.2 % — ABNORMAL LOW (ref 36.0–46.0)
Hemoglobin: 8.4 g/dL — ABNORMAL LOW (ref 12.0–15.0)
MCH: 23.7 pg — ABNORMAL LOW (ref 26.0–34.0)
MCHC: 29.8 g/dL — ABNORMAL LOW (ref 30.0–36.0)
MCV: 79.7 fL — ABNORMAL LOW (ref 80.0–100.0)
Platelets: 199 10*3/uL (ref 150–400)
RBC: 3.54 MIL/uL — ABNORMAL LOW (ref 3.87–5.11)
RDW: 19.3 % — ABNORMAL HIGH (ref 11.5–15.5)
WBC: 8.4 10*3/uL (ref 4.0–10.5)
nRBC: 0 % (ref 0.0–0.2)

## 2020-11-11 LAB — MAGNESIUM: Magnesium: 1.9 mg/dL (ref 1.7–2.4)

## 2020-11-11 LAB — BASIC METABOLIC PANEL
Anion gap: 11 (ref 5–15)
BUN: 8 mg/dL (ref 6–20)
CO2: 19 mmol/L — ABNORMAL LOW (ref 22–32)
Calcium: 8.8 mg/dL — ABNORMAL LOW (ref 8.9–10.3)
Chloride: 103 mmol/L (ref 98–111)
Creatinine, Ser: 0.81 mg/dL (ref 0.44–1.00)
GFR, Estimated: >60 mL/min (ref >60)
Glucose, Bld: 109 mg/dL — ABNORMAL HIGH (ref 70–99)
Potassium: 3.6 mmol/L (ref 3.5–5.1)
Sodium: 133 mmol/L — ABNORMAL LOW (ref 135–145)

## 2020-11-11 MED ORDER — FOLIC ACID 1 MG PO TABS
1.0000 mg | ORAL_TABLET | Freq: Every day | ORAL | Status: DC
  Administered 2020-11-12 – 2020-11-18 (×7): 1 mg via ORAL
  Filled 2020-11-11 (×7): qty 1

## 2020-11-11 MED ORDER — AMLODIPINE BESYLATE 5 MG PO TABS: 5.0000 mg | ORAL_TABLET | Freq: Once | ORAL | Status: AC

## 2020-11-11 MED ORDER — AMLODIPINE BESYLATE 5 MG PO TABS
5.0000 mg | ORAL_TABLET | Freq: Once | ORAL | Status: AC
  Administered 2020-11-11: 5 mg via ORAL
  Filled 2020-11-11: qty 1

## 2020-11-11 MED ORDER — HYDRALAZINE HCL 25 MG PO TABS
25.0000 mg | ORAL_TABLET | Freq: Three times a day (TID) | ORAL | Status: DC
  Administered 2020-11-11 – 2020-11-12 (×3): 25 mg via ORAL
  Filled 2020-11-11 (×3): qty 1

## 2020-11-11 MED ORDER — CHLORTHALIDONE 25 MG PO TABS
25.0000 mg | ORAL_TABLET | Freq: Every day | ORAL | Status: DC
  Administered 2020-11-11 – 2020-11-14 (×4): 25 mg via ORAL
  Filled 2020-11-11 (×5): qty 1

## 2020-11-11 MED ORDER — AMLODIPINE BESYLATE 10 MG PO TABS
10.0000 mg | ORAL_TABLET | Freq: Every day | ORAL | Status: DC
  Administered 2020-11-12 – 2020-11-18 (×7): 10 mg via ORAL
  Filled 2020-11-11 (×7): qty 1

## 2020-11-11 MED ORDER — MAGNESIUM SULFATE 2 GM/50ML IV SOLN
2.0000 g | Freq: Once | INTRAVENOUS | Status: AC
  Administered 2020-11-11: 2 g via INTRAVENOUS
  Filled 2020-11-11: qty 50

## 2020-11-11 MED ORDER — METOPROLOL TARTRATE 50 MG PO TABS
50.0000 mg | ORAL_TABLET | Freq: Three times a day (TID) | ORAL | Status: DC
  Administered 2020-11-11 – 2020-11-12 (×2): 50 mg via ORAL
  Filled 2020-11-11 (×2): qty 1

## 2020-11-11 MED ORDER — THIAMINE HCL 100 MG/ML IJ SOLN
100.0000 mg | Freq: Every day | INTRAMUSCULAR | Status: DC
  Filled 2020-11-11 (×2): qty 2

## 2020-11-11 MED ORDER — LORAZEPAM 2 MG/ML IJ SOLN
0.0000 mg | INTRAMUSCULAR | Status: DC
  Administered 2020-11-11: 1 mg via INTRAVENOUS
  Filled 2020-11-11: qty 1

## 2020-11-11 MED ORDER — ADULT MULTIVITAMIN W/MINERALS CH
1.0000 | ORAL_TABLET | Freq: Every day | ORAL | Status: DC
  Administered 2020-11-12 – 2020-11-18 (×7): 1 via ORAL
  Filled 2020-11-11 (×7): qty 1

## 2020-11-11 MED ORDER — LORAZEPAM 2 MG/ML IJ SOLN: 1.0000 mg | INTRAMUSCULAR | Status: DC | PRN

## 2020-11-11 MED ORDER — POTASSIUM CHLORIDE CRYS ER 20 MEQ PO TBCR
40.0000 meq | EXTENDED_RELEASE_TABLET | Freq: Once | ORAL | Status: AC
  Administered 2020-11-11: 40 meq via ORAL
  Filled 2020-11-11: qty 2

## 2020-11-11 MED ORDER — THIAMINE HCL 100 MG PO TABS
100.0000 mg | ORAL_TABLET | Freq: Every day | ORAL | Status: DC
  Administered 2020-11-12 – 2020-11-18 (×7): 100 mg via ORAL
  Filled 2020-11-11 (×8): qty 1

## 2020-11-11 MED ORDER — DILTIAZEM HCL-DEXTROSE 125-5 MG/125ML-% IV SOLN (PREMIX)
5.0000 mg/h | INTRAVENOUS | Status: DC
  Administered 2020-11-11: 5 mg/h via INTRAVENOUS
  Administered 2020-11-12: 12.5 mg/h via INTRAVENOUS
  Administered 2020-11-12: 15 mg/h via INTRAVENOUS
  Filled 2020-11-11 (×4): qty 125

## 2020-11-11 MED ORDER — DEXMEDETOMIDINE HCL IN NACL 400 MCG/100ML IV SOLN
0.2000 ug/kg/h | INTRAVENOUS | Status: DC
  Administered 2020-11-11: 0.4 ug/kg/h via INTRAVENOUS
  Administered 2020-11-12: 0.2 ug/kg/h via INTRAVENOUS
  Administered 2020-11-12: 0.7 ug/kg/h via INTRAVENOUS
  Filled 2020-11-11 (×3): qty 100

## 2020-11-11 MED ORDER — LORAZEPAM 1 MG PO TABS: 1.0000 mg | ORAL_TABLET | ORAL | Status: DC | PRN

## 2020-11-11 MED ORDER — LORAZEPAM 2 MG/ML IJ SOLN: 0.0000 mg | Freq: Three times a day (TID) | INTRAMUSCULAR | Status: DC

## 2020-11-11 NOTE — Progress Notes (Signed)
eLink Physician-Brief Progress Note Patient Name: Robin Guerrero DOB: 11-Aug-1986 MRN: [id number]   Date of Service  11/11/2020  HPI/Events of Note  Called for some increased confusion in setting of recently receiving first dose of Ativan per CIWA protocol. Patient is sitting up at edge of bed. She is somewhat confused but not agitated, not tremulous. She endorses some dyspnea but SpO2 100% on RA. That said, she is +4.4 L since admission (primarily being driven by esmolol infusion which is providing ~125cc/hr of IVF). She does have edema in extremities (predominantly R hand/UE). Her vitals show increase in HR to mid 90s and SBP to 130s which is above goal given her aortic dissection.   eICU Interventions  Start diltiazem drip as an esmolol sparing agent in order to reduce total IVF administration.  Her confusion may be a paradoxical reaction to benzodiazepines. I do not think she is in EtOH withdrawal at present. Hold further Ativan for now. Call MD if there is concern for her scoring on CIWA scale / need to give Ativan.      Intervention Category Intermediate Interventions: Change in mental status - evaluation and management  Charlott Rakes 11/11/2020, 8:17 PM

## 2020-11-11 NOTE — Progress Notes (Signed)
eLink Physician-Brief Progress Note Patient Name: Robin Guerrero DOB: 1986/01/24 MRN: [id number]   Date of Service  11/11/2020  HPI/Events of Note  RN requests soft lap restraint as patient continues to have some confusion and is attempting to get out of bed.   eICU Interventions  Ordered restraint.      Intervention Category Minor Interventions: Routine modifications to care plan (e.g. PRN medications for pain, fever)  Robin Guerrero Robin Guerrero 11/11/2020, 9:18 PM

## 2020-11-11 NOTE — Progress Notes (Signed)
Pt is constantly trying to get out of the bed despite reorienting measures. Pt is a High Fall Risk and has an unstable gate. RN received orders for Lap Restraint. RN applied restraint as directed and educated pt on why we were putting her in the restraint. RN does not believe pt is aware of her own limitations and therefore will monitor the patient closely for signs of injury.

## 2020-11-11 NOTE — Progress Notes (Signed)
[place] Progress Note Patient Name: Robin Guerrero DOB: 1986-08-02 MRN: [id number]   Date of Service  11/11/2020  HPI/Events of Note  Ongoing agitation and now request for soft bilateral wrist restraints.  Re-evaluated patient. There does appear to be increased agitation compared to last evaluation and this is interfering with our ability to meet her BP and HR targets.   eICU Interventions  Start Precedex drip to reduce agitation.  Ordered additional wrist restraints but these should be removed later on if possible once Precedex is uptitrated.      Intervention Category Minor Interventions: Agitation / anxiety - evaluation and management  Charlott Rakes 11/11/2020, 9:41 PM

## 2020-11-11 NOTE — Plan of Care (Signed)

## 2020-11-11 NOTE — Progress Notes (Signed)
   ASSESSMENT & PLAN:  Robin Guerrero is a 34 y.o. female with type B aortic dissection. No indications for urgent intervention at the moment.   Anti-impulse control with HR < 80 and SBP < 120. Both appear better today. Continue to increase oral regimen and try to wean from esmolol.  SUBJECTIVE:  HR/BP improved. Still having some abdominal discomfort.   OBJECTIVE:  BP 125/79   Pulse 83   Temp 98.3 F (36.8 C) (Oral)   Resp (!) 23   Ht 5\' 9"  (1.753 m)   Wt 107.6 kg   SpO2 99%   BMI 35.03 kg/m   Intake/Output Summary (Last 24 hours) at 11/11/2020 1029 Last data filed at 11/11/2020 0900 Gross per 24 hour  Intake 2221.87 ml  Output --  Net 2221.87 ml     Constitutional: well appearing. no acute distress. Cardiac: RRR. Vascular: 2+ DP/Radials Pulmonary: unlabored Abdominal: soft  CBC Latest Ref Rng & Units 11/11/2020 11/10/2020 11/09/2020  WBC 4.0 - 10.5 K/uL 8.4 8.5 8.5  Hemoglobin 12.0 - 15.0 g/dL 8.4(L) 8.5(L) 9.5(L)  Hematocrit 36 - 46 % 28.2(L) 28.4(L) 31.9(L)  Platelets 150 - 400 K/uL 199 194 191     CMP Latest Ref Rng & Units 11/11/2020 11/10/2020 11/09/2020  Glucose 70 - 99 mg/dL 109(H) 96 104(H)  BUN 6 - 20 mg/dL 8 8 9   Creatinine 0.44 - 1.00 mg/dL 0.81 0.78 0.81  Sodium 135 - 145 mmol/L 133(L) 132(L) 134(L)  Potassium 3.5 - 5.1 mmol/L 3.6 3.5 3.0(L)  Chloride 98 - 111 mmol/L 103 102 99  CO2 22 - 32 mmol/L 19(L) 20(L) 24  Calcium 8.9 - 10.3 mg/dL 8.8(L) 8.8(L) 9.1  Total Protein 6.5 - 8.1 g/dL - - 7.8  Total Bilirubin 0.3 - 1.2 mg/dL - - 2.1(H)  Alkaline Phos 38 - 126 U/L - - 48  AST 15 - 41 U/L - - 15  ALT 0 - 44 U/L - - 14    Estimated Creatinine Clearance: 127.9 mL/min (by C-G formula based on SCr of 0.81 mg/dL).  [name]. Stanford Breed, MD [facility] Phone Number: [phone] 11/11/2020 10:29 AM

## 2020-11-11 NOTE — Progress Notes (Signed)
Upon entering pt room appox. 1905, pt was standing at bedside with previous RN on shift swaying with an unstable gate. NT was changing pt lien. Previous RN noted that the patient was starting to become more confused and agitated. Pt was given 1mg  Ativan per CIWA around 1700. On my shift assessment, pt was A/Ox1 (Self) - previously A/Ox4, and will not stay in the bed. Pt was redirected multiple times with no progress to orient to baseline. With pt Vital Signs not with goal parameters (SBP <120, HR <80), RN called Elink to receive further instructions in regards to management for this patient.

## 2020-11-11 NOTE — Progress Notes (Signed)
RN attempted to redirect pt and pt was still attempting to get out of the bed. Pt then started to pull her cord off the monitor and purwick out of place. RN called ELink and received orders for Non-Violent Wrist Restraints and Precedex. RN plans to d/c lap belt restraint once precedex takes affect. RN will also monitor the need for wrist restraints and d/c as appropriate based on patient behavior.

## 2020-11-11 NOTE — Progress Notes (Addendum)
NAMENoell Guerrero, MRN:  [id number], DOB:  [birth year], LOS: 2 ADMISSION DATE:  11/09/2020, CONSULTATION DATE:  11/09/20 REFERRING MD:  Regenia Skeeter  CHIEF COMPLAINT:  Chest, back, abd pain   Brief History   34 y/o F who was admitted 11/25 with large type B aortic dissection.  She initially presented to [facility] ED 11/23 with N/V, abd pain.  She was hypertensive and it was felt this was due to her missing antihypertensive meds due to vomiting.  She was discharged with bentyl, zofran and instructed to follow up with PCP. 11/24 and 11/25 she had ongoing pain which worsened 11/25 and radiated lower into her back prompting her to return to [facility] ED.  She had CT abdomen which revealed a descending thoracic aortic dissection.  Case was discussed with Dr. Luan Pulling of vascular surgery who recommended transfer to [facility] and tight BP control for the night.  Past Medical History  has Alcoholic pancreatitis; SIRS (systemic inflammatory response syndrome) ([place]); Hypokalemia; Hypomagnesemia; Prolonged QT interval; Essential hypertension; Depression; Tobacco use; Hypocalcemia; Acute alcoholic pancreatitis; Thrombocytopenia ([place]); Normocytic anemia; Descending thoracic dissection ([place]); Hypertensive emergency; Nausea; and Back pain on their problem list.  Significant Hospital Events   11/25 Admit 11/26 Patient with ongoing mild back pain and headache 11/27 Remains on Esmolol gtt, BP/HR controlled   Consults:  Vascular surgery  Procedures:  Art line 11/25 > out  Significant Diagnostic Tests:   CT A / P 11/25 > descending thoracic aorta dissection, trace left pleural effusion, trace haziness surrounding the uncinate process of pancreas.  CTA chest 11/25 > type B thoracic aortic dissection involving the entire abdominal aorta and possibly through to the origin of the right common femoral artery, possible mild acute pancreatitis.  Micro Data:  Flu 11/25 > neg. COVID 11/25 > neg.  Antimicrobials:      Interim  history/subjective:  Afebrile  On room air  Glucose range 96-109 No I/O documented  Remains on esmolol gtt   Objective:  Blood pressure 118/81, pulse 82, temperature 98.3 F (36.8 C), temperature source Oral, resp. rate (!) 21, height 5\' 9"  (1.753 m), weight 107.6 kg, SpO2 97 %.        Intake/Output Summary (Last 24 hours) at 11/11/2020 0801 Last data filed at 11/11/2020 0700 Gross per 24 hour  Intake 2296.67 ml  Output --  Net 2296.67 ml   Filed Weights   11/09/20 1710 11/11/20 0400  Weight: 108.9 kg 107.6 kg    Examination: General: adult female sitting up in bed in NAD HEENT: MM pink/moist, anicteric  Neuro: AAOx4, speech clear, MAE  CV: s1s2 RRR, no m/r/g PULM: non-labored on RA, lungs bilaterally clear  GI: soft, bsx4 active  Extremities: warm/dry, no edema  Skin: no rashes or lesions  Assessment & Plan:   Type B aortic dissection  Presumed 2/2 uncontrolled HTN -SBP goal <120, HR <80 per VVS  -continue cozaar -increase norvasc to 10 mg QD -lopressor 25mg  PO Q6  -add chlorthalidone, hydralazine  -wean esmolol gtt off for above parameters -PRN hydralazine -appreciate VVS   Hypertensive emergency with likely uncontrolled HTN at baseline Home medications include Norvasc, Cozaar, Metoprolol.  -tele monitoring -wean esmolol to off   Nausea  2/2 above  -PRN zofran  -advance diet as tolerated if ok with VVS   Possible mild pancreatitis on CT (lipase 125). -supportive care  -advance to heart healthy diet   Hypokalemia Hyponatremia  -monitor, replace as indicated   Hx depression, EtOH use, tobacco use -continue  home gabapentin, thiamine, folate, nicotine patch  Best Practice:  Diet: Heart healthy  Pain/Anxiety/Delirium protocol (if indicated): Fentanyl PRN. VAP protocol (if indicated): N/A. DVT prophylaxis: SCD's only. GI prophylaxis: None. Glucose control: None. Mobility: Bedrest. Last date of multidisciplinary goals of care discussion: Pending   Family and staff present: Pending. Summary of discussion: Pending. Follow up goals of care discussion due: 12/1. Code Status: Full. Family Communication: Patient updated on plan of care 11/27 Disposition: ICU.  Labs   CBC: Recent Labs  Lab 11/07/20 2240 11/09/20 1733 11/10/20 0645 11/11/20 0430  WBC 10.6* 8.5 8.5 8.4  NEUTROABS 8.7* 6.9  --   --   HGB 9.5* 9.5* 8.5* 8.4*  HCT 31.5* 31.9* 28.4* 28.2*  MCV 78.8* 80.2 78.5* 79.7*  PLT 211 191 194 707   Basic Metabolic Panel: Recent Labs  Lab 11/07/20 2240 11/09/20 1733 11/10/20 0645 11/11/20 0430  NA 138 134* 132* 133*  K 2.8* 3.0* 3.5 3.6  CL 104 99 102 103  CO2 21* 24 20* 19*  GLUCOSE 146* 104* 96 109*  BUN 7 9 8 8   CREATININE 0.78 0.81 0.78 0.81  CALCIUM 9.0 9.1 8.8* 8.8*  MG 1.4*  --  1.8 1.9  PHOS  --   --  3.5  --    GFR: Estimated Creatinine Clearance: 127.9 mL/min (by C-G formula based on SCr of 0.81 mg/dL). Recent Labs  Lab 11/07/20 2240 11/09/20 1733 11/10/20 0645 11/11/20 0430  WBC 10.6* 8.5 8.5 8.4   Liver Function Tests: Recent Labs  Lab 11/07/20 2240 11/09/20 1733  AST 22 15  ALT 16 14  ALKPHOS 46 48  BILITOT 1.2 2.1*  PROT 7.7 7.8  ALBUMIN 3.9 3.7   Recent Labs  Lab 11/07/20 2240 11/09/20 1733  LIPASE 28 125*   No results for input(s): AMMONIA in the last 168 hours. ABG    Component Value Date/Time   TCO2 25 08/09/2013 1333    Coagulation Profile: No results for input(s): INR, PROTIME in the last 168 hours. Cardiac Enzymes: No results for input(s): CKTOTAL, CKMB, CKMBINDEX, TROPONINI in the last 168 hours. HbA1C: No results found for: HGBA1C CBG: No results for input(s): GLUCAP in the last 168 hours.    Critical care time: 77 minutes    Noe Gens, MSN, NP-C, AGACNP-BC [facility] 11/11/2020, 8:01 AM   Please see [email or web address] for pager details.

## 2020-11-12 DIAGNOSIS — I7101 Dissection of thoracic aorta: Secondary | ICD-10-CM | POA: Diagnosis not present

## 2020-11-12 LAB — COMPREHENSIVE METABOLIC PANEL
ALT: 12 U/L (ref 0–44)
AST: 14 U/L — ABNORMAL LOW (ref 15–41)
Albumin: 2.9 g/dL — ABNORMAL LOW (ref 3.5–5.0)
Alkaline Phosphatase: 41 U/L (ref 38–126)
Anion gap: 11 (ref 5–15)
BUN: 8 mg/dL (ref 6–20)
CO2: 17 mmol/L — ABNORMAL LOW (ref 22–32)
Calcium: 9 mg/dL (ref 8.9–10.3)
Chloride: 104 mmol/L (ref 98–111)
Creatinine, Ser: 0.78 mg/dL (ref 0.44–1.00)
GFR, Estimated: >60 mL/min (ref >60)
Glucose, Bld: 94 mg/dL (ref 70–99)
Potassium: 4.2 mmol/L (ref 3.5–5.1)
Sodium: 132 mmol/L — ABNORMAL LOW (ref 135–145)
Total Bilirubin: 0.7 mg/dL (ref 0.3–1.2)
Total Protein: 6.7 g/dL (ref 6.5–8.1)

## 2020-11-12 LAB — CBC
HCT: 28.3 % — ABNORMAL LOW (ref 36.0–46.0)
Hemoglobin: 8.5 g/dL — ABNORMAL LOW (ref 12.0–15.0)
MCH: 23.8 pg — ABNORMAL LOW (ref 26.0–34.0)
MCHC: 30 g/dL (ref 30.0–36.0)
MCV: 79.3 fL — ABNORMAL LOW (ref 80.0–100.0)
Platelets: 202 10*3/uL (ref 150–400)
RBC: 3.57 MIL/uL — ABNORMAL LOW (ref 3.87–5.11)
RDW: 19.2 % — ABNORMAL HIGH (ref 11.5–15.5)
WBC: 8.4 10*3/uL (ref 4.0–10.5)
nRBC: 0 % (ref 0.0–0.2)

## 2020-11-12 LAB — C-REACTIVE PROTEIN: CRP: 9.3 mg/dL — ABNORMAL HIGH (ref <1.0)

## 2020-11-12 LAB — MAGNESIUM: Magnesium: 1.9 mg/dL (ref 1.7–2.4)

## 2020-11-12 MED ORDER — METOPROLOL TARTRATE 50 MG PO TABS
75.0000 mg | ORAL_TABLET | Freq: Three times a day (TID) | ORAL | Status: DC
  Administered 2020-11-12 – 2020-11-14 (×6): 75 mg via ORAL
  Filled 2020-11-12 (×6): qty 1

## 2020-11-12 MED ORDER — QUETIAPINE FUMARATE 25 MG PO TABS
25.0000 mg | ORAL_TABLET | Freq: Two times a day (BID) | ORAL | Status: DC
  Administered 2020-11-12 – 2020-11-18 (×13): 25 mg via ORAL
  Filled 2020-11-12 (×13): qty 1

## 2020-11-12 MED ORDER — LABETALOL HCL 5 MG/ML IV SOLN
0.5000 mg/min | Status: DC
  Administered 2020-11-12: 4 mg/min via INTRAVENOUS
  Administered 2020-11-12: 3 mg/min via INTRAVENOUS
  Administered 2020-11-12: 3.5 mg/min via INTRAVENOUS
  Administered 2020-11-12: 0.5 mg/min via INTRAVENOUS
  Administered 2020-11-13: 2 mg/min via INTRAVENOUS
  Filled 2020-11-12 (×6): qty 200

## 2020-11-12 MED ORDER — HYDRALAZINE HCL 50 MG PO TABS
50.0000 mg | ORAL_TABLET | Freq: Three times a day (TID) | ORAL | Status: DC
  Administered 2020-11-12 – 2020-11-16 (×12): 50 mg via ORAL
  Filled 2020-11-12 (×14): qty 1

## 2020-11-12 NOTE — Progress Notes (Signed)
Pt is becoming more alert and oriented. Pt is able to answer questions appropriately therefore, RN d/c restraints at this time and turned off precedex. RN made pt aware of the situation and why she was in restraints. Pt seemed to understand but will continue to assess for any changes in mentation.

## 2020-11-12 NOTE — Progress Notes (Signed)
   ASSESSMENT & PLAN:  Robin Guerrero is a 34 y.o. female with type B aortic dissection. No indications for urgent intervention at the moment.   Anti-impulse control with HR < 80 and SBP < 120. Worsening drip requirement, but hemodynamics remain within range. Alcohol withdrawal will complicate this moving forward.   Delirium tremens. Agitation well controlled this AM.   Discussed case with Dr. Lynetta Mare in detail.   SUBJECTIVE:  Strange affect this AM. Would not speak to me. Just nodding head yes / no.  OBJECTIVE:  BP 114/78   Pulse 74   Temp 98.2 F (36.8 C)   Resp 17   Ht 5\' 9"  (1.753 m)   Wt 111.2 kg   SpO2 96%   BMI 36.20 kg/m   Intake/Output Summary (Last 24 hours) at 11/12/2020 0833 Last data filed at 11/12/2020 0600 Gross per 24 hour  Intake 2179.84 ml  Output 700 ml  Net 1479.84 ml     Constitutional: well appearing. no acute distress. Cardiac: RRR. Vascular: 2+ DP/Radials Pulmonary: unlabored Abdominal: soft  CBC Latest Ref Rng & Units 11/12/2020 11/11/2020 11/10/2020  WBC 4.0 - 10.5 K/uL 8.4 8.4 8.5  Hemoglobin 12.0 - 15.0 g/dL 8.5(L) 8.4(L) 8.5(L)  Hematocrit 36 - 46 % 28.3(L) 28.2(L) 28.4(L)  Platelets 150 - 400 K/uL 202 199 194     CMP Latest Ref Rng & Units 11/12/2020 11/11/2020 11/10/2020  Glucose 70 - 99 mg/dL 94 109(H) 96  BUN 6 - 20 mg/dL 8 8 8   Creatinine 0.44 - 1.00 mg/dL 0.78 0.81 0.78  Sodium 135 - 145 mmol/L 132(L) 133(L) 132(L)  Potassium 3.5 - 5.1 mmol/L 4.2 3.6 3.5  Chloride 98 - 111 mmol/L 104 103 102  CO2 22 - 32 mmol/L 17(L) 19(L) 20(L)  Calcium 8.9 - 10.3 mg/dL 9.0 8.8(L) 8.8(L)  Total Protein 6.5 - 8.1 g/dL 6.7 - -  Total Bilirubin 0.3 - 1.2 mg/dL 0.7 - -  Alkaline Phos 38 - 126 U/L 41 - -  AST 15 - 41 U/L 14(L) - -  ALT 0 - 44 U/L 12 - -    Estimated Creatinine Clearance: 131.7 mL/min (by C-G formula based on SCr of 0.78 mg/dL).  Robin Guerrero. Stanford Breed, MD [facility] Phone Number:  [phone] 11/12/2020 8:33 AM

## 2020-11-12 NOTE — Progress Notes (Addendum)
Pt attempted to get out of the bed and was redirected. Pt seems to be A/Ox 2 (self & time). When pt asked where she was she stated "[place]". Pt was directed back to bed with no injuries.

## 2020-11-12 NOTE — Progress Notes (Signed)
Pt is starting to become agitated and taking the monitor equipment off. Pt is also trying to make her way to the foot of the bed. Pt is Alert and Oriented x4 at this time. I restarted the Precedex as ordered to help the patient relax and remain safe.

## 2020-11-12 NOTE — Plan of Care (Signed)
  Problem: Clinical Measurements: Goal: Ability to maintain clinical measurements within normal limits will improve Outcome: Progressing Goal: Will remain free from infection Outcome: Progressing Goal: Diagnostic test results will improve Outcome: Progressing Goal: Respiratory complications will improve Outcome: Progressing Goal: Cardiovascular complication will be avoided Outcome: Progressing   Problem: Activity: Goal: Risk for activity intolerance will decrease Outcome: Progressing   Problem: Nutrition: Goal: Adequate nutrition will be maintained Outcome: Progressing   Problem: Education: Goal: Knowledge of General Education information will improve Description: Including pain rating scale, medication(s)/side effects and non-pharmacologic comfort measures Outcome: Not Progressing   Problem: Health Behavior/Discharge Planning: Goal: Ability to manage health-related needs will improve Outcome: Not Progressing   Problem: Coping: Goal: Level of anxiety will decrease Outcome: Not Progressing

## 2020-11-12 NOTE — Plan of Care (Signed)
  Problem: Coping: Goal: Level of anxiety will decrease Outcome: Progressing   Problem: Elimination: Goal: Will not experience complications related to urinary retention Outcome: Progressing   Problem: Safety: Goal: Ability to remain free from injury will improve Outcome: Progressing   Problem: Skin Integrity: Goal: Risk for impaired skin integrity will decrease Outcome: Progressing   Problem: Nutrition: Goal: Adequate nutrition will be maintained Outcome: Not Progressing

## 2020-11-12 NOTE — Progress Notes (Signed)
RN removed lap belt restraint. Will continue to assess the need of restraints based on pt behavior.

## 2020-11-12 NOTE — Progress Notes (Signed)
NAMEMadisen Guerrero, MRN:  [id number], DOB:  05-08-1986, LOS: 3 ADMISSION DATE:  11/09/2020, CONSULTATION DATE:  11/09/20 REFERRING MD:  Regenia Skeeter  CHIEF COMPLAINT:  Chest, back, abd pain   Brief History   34 y/o F who was admitted 11/25 with large type B aortic dissection.  She initially presented to [facility] ED 11/23 with N/V, abd pain.  She was hypertensive and it was felt this was due to her missing antihypertensive meds due to vomiting.  She was discharged with bentyl, zofran and instructed to follow up with PCP. 11/24 and 11/25 she had ongoing pain which worsened 11/25 and radiated lower into her back prompting her to return to [facility] ED.  She had CT abdomen which revealed a descending thoracic aortic dissection.  Case was discussed with Dr. Luan Pulling of vascular surgery who recommended transfer to [facility] and tight BP control for the night.  Past Medical History  has Alcoholic pancreatitis; SIRS (systemic inflammatory response syndrome) ([place]); Hypokalemia; Hypomagnesemia; Prolonged QT interval; Essential hypertension; Depression; Tobacco use; Hypocalcemia; Acute alcoholic pancreatitis; Thrombocytopenia ([place]); Normocytic anemia; Descending thoracic dissection ([place]); Hypertensive emergency; Nausea; and Back pain on their problem list.  Significant Hospital Events   11/25 Admit 11/26 Patient with ongoing mild back pain and headache 11/27 Remains on Esmolol gtt, BP/HR controlled   Consults:  Vascular surgery  Procedures:  Art line 11/25 > out  Significant Diagnostic Tests:   CT A / P 11/25 > descending thoracic aorta dissection, trace left pleural effusion, trace haziness surrounding the uncinate process of pancreas.  CTA chest 11/25 > type B thoracic aortic dissection involving the entire abdominal aorta and possibly through to the origin of the right common femoral artery, possible mild acute pancreatitis.  Micro Data:  Flu 11/25 > neg. COVID 11/25 > neg.  Antimicrobials:      Interim  history/subjective:  Became more confused overnight with worsening hypertension and spite of esmolol infusion.  Started on diltiazem infusion as well.  Objective:  Blood pressure 129/75, pulse 76, temperature 97.7 F (36.5 C), resp. rate 20, height 5\' 9"  (1.753 m), weight 111.2 kg, SpO2 97 %.        Intake/Output Summary (Last 24 hours) at 11/12/2020 1215 Last data filed at 11/12/2020 1200 Gross per 24 hour  Intake 2471.27 ml  Output 1700 ml  Net 771.27 ml   Filed Weights   11/09/20 1710 11/11/20 0400 11/12/20 0500  Weight: 108.9 kg 107.6 kg 111.2 kg    Examination: General: adult female sitting up in bed in NAD HEENT: MM pink/moist, anicteric  Neuro: Somnolent, speech soft.  Patient is disoriented.  No focal deficits. CV: s1s2 RRR, no m/r/g PULM: non-labored on RA, lungs bilaterally clear  GI: soft, bsx4 active  Extremities: warm/dry, no edema  Skin: no rashes or lesions  Assessment & Plan:   Remains critically ill due to type B aortic dissection requiring titration of IV antihypertensive agent. Presumed 2/2 uncontrolled HTN but may also have component of alcohol withdrawal or collagen vascular disease -SBP goal <120, HR <80 per VVS  -Switch esmolol and diltiazem to labetalol infusions. -Continue current enteral agents.  Will increase hydralazine if necessary.   Medically ill due to agitated delirium possibly due to alcohol withdrawal.  However, remains confused signs of autonomic hyperactivity, thus some form of hypertensive encephalopathy variant may also be possible -Continue dexmedetomidine -Continue CIWA triggered lorazepam -We will add Seroquel.  Hyponatremia, mild anion gap acidosis -monitor, replace as indicated   Hx depression, EtOH  use, tobacco use -continue home gabapentin, thiamine, folate, nicotine patch  Best Practice:  Diet: Heart healthy  Pain/Anxiety/Delirium protocol (if indicated): Fentanyl PRN.  CIWA triggered lorazepam.  Started Seroquel. VAP  protocol (if indicated): N/A. DVT prophylaxis: SCD's only. GI prophylaxis: None. Glucose control: None. Mobility: Bedrest. Last date of multidisciplinary goals of care discussion: Pending  Family and staff present: Pending. Summary of discussion: Pending. Follow up goals of care discussion due: 12/1. Code Status: Full. Family Communication: Patient updated on plan of care 11/28 Disposition: ICU.  Labs   CBC: Recent Labs  Lab 11/07/20 2240 11/09/20 1733 11/10/20 0645 11/11/20 0430 11/12/20 0110  WBC 10.6* 8.5 8.5 8.4 8.4  NEUTROABS 8.7* 6.9  --   --   --   HGB 9.5* 9.5* 8.5* 8.4* 8.5*  HCT 31.5* 31.9* 28.4* 28.2* 28.3*  MCV 78.8* 80.2 78.5* 79.7* 79.3*  PLT 211 191 194 199 834   Basic Metabolic Panel: Recent Labs  Lab 11/07/20 2240 11/09/20 1733 11/10/20 0645 11/11/20 0430 11/12/20 0110  NA 138 134* 132* 133* 132*  K 2.8* 3.0* 3.5 3.6 4.2  CL 104 99 102 103 104  CO2 21* 24 20* 19* 17*  GLUCOSE 146* 104* 96 109* 94  BUN 7 9 8 8 8   CREATININE 0.78 0.81 0.78 0.81 0.78  CALCIUM 9.0 9.1 8.8* 8.8* 9.0  MG 1.4*  --  1.8 1.9 1.9  PHOS  --   --  3.5  --   --    GFR: Estimated Creatinine Clearance: 131.7 mL/min (by C-G formula based on SCr of 0.78 mg/dL). Recent Labs  Lab 11/09/20 1733 11/10/20 0645 11/11/20 0430 11/12/20 0110  WBC 8.5 8.5 8.4 8.4   Liver Function Tests: Recent Labs  Lab 11/07/20 2240 11/09/20 1733 11/12/20 0110  AST 22 15 14*  ALT 16 14 12   ALKPHOS 46 48 41  BILITOT 1.2 2.1* 0.7  PROT 7.7 7.8 6.7  ALBUMIN 3.9 3.7 2.9*   Recent Labs  Lab 11/07/20 2240 11/09/20 1733  LIPASE 28 125*   No results for input(s): AMMONIA in the last 168 hours. ABG    Component Value Date/Time   TCO2 25 08/09/2013 1333    Coagulation Profile: No results for input(s): INR, PROTIME in the last 168 hours. Cardiac Enzymes: No results for input(s): CKTOTAL, CKMB, CKMBINDEX, TROPONINI in the last 168 hours. HbA1C: No results found for: HGBA1C CBG: No  results for input(s): GLUCAP in the last 168 hours.   CRITICAL CARE Performed by: Kipp Brood   Total critical care time: 35 minutes  Critical care time was exclusive of separately billable procedures and treating other patients.  Critical care was necessary to treat or prevent imminent or life-threatening deterioration.  Critical care was time spent personally by me on the following activities: development of treatment plan with patient and/or surrogate as well as nursing, discussions with consultants, evaluation of patient's response to treatment, examination of patient, obtaining history from patient or surrogate, ordering and performing treatments and interventions, ordering and review of laboratory studies, ordering and review of radiographic studies, pulse oximetry, re-evaluation of patient's condition and participation in multidisciplinary rounds.  Kipp Brood, MD [facility] ICU Physician [place]  Pager: [phone] Mobile: [phone] After hours: [phone].  Marland Kitchen

## 2020-11-13 DIAGNOSIS — E872 Acidosis: Secondary | ICD-10-CM | POA: Diagnosis not present

## 2020-11-13 DIAGNOSIS — I7101 Dissection of thoracic aorta: Secondary | ICD-10-CM | POA: Diagnosis not present

## 2020-11-13 DIAGNOSIS — D649 Anemia, unspecified: Secondary | ICD-10-CM | POA: Diagnosis not present

## 2020-11-13 LAB — COMPREHENSIVE METABOLIC PANEL
ALT: 16 U/L (ref 0–44)
AST: 23 U/L (ref 15–41)
Albumin: 3 g/dL — ABNORMAL LOW (ref 3.5–5.0)
Alkaline Phosphatase: 44 U/L (ref 38–126)
Anion gap: 12 (ref 5–15)
BUN: 9 mg/dL (ref 6–20)
CO2: 18 mmol/L — ABNORMAL LOW (ref 22–32)
Calcium: 9.3 mg/dL (ref 8.9–10.3)
Chloride: 104 mmol/L (ref 98–111)
Creatinine, Ser: 0.99 mg/dL (ref 0.44–1.00)
GFR, Estimated: >60 mL/min (ref >60)
Glucose, Bld: 119 mg/dL — ABNORMAL HIGH (ref 70–99)
Potassium: 3.6 mmol/L (ref 3.5–5.1)
Sodium: 134 mmol/L — ABNORMAL LOW (ref 135–145)
Total Bilirubin: 1 mg/dL (ref 0.3–1.2)
Total Protein: 6.8 g/dL (ref 6.5–8.1)

## 2020-11-13 LAB — CBC
HCT: 29.4 % — ABNORMAL LOW (ref 36.0–46.0)
Hemoglobin: 8.9 g/dL — ABNORMAL LOW (ref 12.0–15.0)
MCH: 23.5 pg — ABNORMAL LOW (ref 26.0–34.0)
MCHC: 30.3 g/dL (ref 30.0–36.0)
MCV: 77.8 fL — ABNORMAL LOW (ref 80.0–100.0)
Platelets: UNDETERMINED 10*3/uL (ref 150–400)
RBC: 3.78 MIL/uL — ABNORMAL LOW (ref 3.87–5.11)
RDW: 19.9 % — ABNORMAL HIGH (ref 11.5–15.5)
WBC: 6.5 10*3/uL (ref 4.0–10.5)
nRBC: 0 % (ref 0.0–0.2)

## 2020-11-13 LAB — MAGNESIUM: Magnesium: 1.6 mg/dL — ABNORMAL LOW (ref 1.7–2.4)

## 2020-11-13 LAB — ANA: Anti Nuclear Antibody (ANA): NEGATIVE

## 2020-11-13 MED ORDER — ALPRAZOLAM 0.5 MG PO TABS
0.5000 mg | ORAL_TABLET | Freq: Once | ORAL | Status: AC | PRN
  Administered 2020-11-14: 0.5 mg via ORAL
  Filled 2020-11-13: qty 1

## 2020-11-13 MED ORDER — CLONIDINE HCL 0.1 MG PO TABS
0.1000 mg | ORAL_TABLET | Freq: Four times a day (QID) | ORAL | Status: DC | PRN
  Administered 2020-11-13 – 2020-11-15 (×3): 0.1 mg via ORAL
  Filled 2020-11-13 (×3): qty 1

## 2020-11-13 NOTE — Progress Notes (Addendum)
  Progress Note    11/13/2020 7:50 AM * No surgery found *  Subjective:  No complaints this morning.  Denies chest or back pain.   Vitals:   11/13/20 0630 11/13/20 0726  BP: 118/68   Pulse: 78   Resp: 20   Temp:  98.4 F (36.9 C)  SpO2: 98%    Physical Exam Lungs:  Non labored Extremities:  Symmetrical radial and DP pulses Abdomen:  Soft, NT, ND Neurologic: Somnolent this morning  CBC    Component Value Date/Time   WBC 8.4 11/12/2020 0110   RBC 3.57 (L) 11/12/2020 0110   HGB 8.5 (L) 11/12/2020 0110   HCT 28.3 (L) 11/12/2020 0110   PLT 202 11/12/2020 0110   MCV 79.3 (L) 11/12/2020 0110   MCH 23.8 (L) 11/12/2020 0110   MCHC 30.0 11/12/2020 0110   RDW 19.2 (H) 11/12/2020 0110   LYMPHSABS 0.9 11/09/2020 1733   MONOABS 0.7 11/09/2020 1733   EOSABS 0.0 11/09/2020 1733   BASOSABS 0.0 11/09/2020 1733    BMET    Component Value Date/Time   NA 132 (L) 11/12/2020 0110   K 4.2 11/12/2020 0110   CL 104 11/12/2020 0110   CO2 17 (L) 11/12/2020 0110   GLUCOSE 94 11/12/2020 0110   BUN 8 11/12/2020 0110   CREATININE 0.78 11/12/2020 0110   CALCIUM 9.0 11/12/2020 0110   GFRNONAA >60 11/12/2020 0110   GFRAA >60 08/27/2020 0351    INR    Component Value Date/Time   INR 1.1 08/27/2020 0953     Intake/Output Summary (Last 24 hours) at 11/13/2020 0750 Last data filed at 11/13/2020 0700 Gross per 24 hour  Intake 1564.6 ml  Output 2301 ml  Net -736.4 ml     Assessment/Plan:  34 y.o. female  With type B aortic dissection  No signs or symptoms of malperfusion on physical exam; no indication for repair at this time HR and BP well controlled currently; continue HR<80 and SBP <120 Repeat CTA c/a/p timing per Dr. Elwyn Lade, PA-C [facility] [phone] 11/13/2020 7:50 AM  VASCULAR STAFF ADDENDUM: I have independently interviewed and examined the patient. I agree with the above.  Agitation much improved. Off all IV  antihypertensives on my evaluation. No chest, back, or abdominal pain.  Appears well controlled on oral regimen. Likely OK for discharge tomorrow. Follow up with me in 1 month with CTA Chest / Abdomen / Pelvis.  [name]. Stanford Breed, MD [facility] Phone Number: [phone] 11/13/2020 12:56 PM

## 2020-11-13 NOTE — Progress Notes (Signed)
RN notices pt BP and HR goals are not bring met and therefore called Elink for further directions. Pt refused IV earlier in the day, and still refuses an IV at this time. Pt was educated on the risk and benefits for the IV and medications. Pt still was non-compliant with IV. Pt states "I am having a hard time sleeping". RN spoke with Elink to see about potentials for a sleep aid. RN will continue to monitor vital signs and administer PRN medications as ordered.

## 2020-11-13 NOTE — Progress Notes (Signed)
eLink Physician-Brief Progress Note Patient Name: Robin Guerrero DOB: 1986-04-24 MRN: [id number]   Date of Service  11/13/2020  HPI/Events of Note  Patient unable to sleep. We hemodynamics not quite meeting parameters. SBP 120 HR 90s. Now refusing IV and worried about taking anything as she was reportedly delerious after giving Ativan.  Patient seen calm at this time  eICU Interventions  She has tolerated Xanax before Ordered a one time dose of xanax 0.5 mg PO     Intervention Category Minor Interventions: Other:  Judd Lien 11/13/2020, 11:55 PM

## 2020-11-13 NOTE — Progress Notes (Signed)
Robin Guerrero, MRN:  [id number], DOB:  [birth year], LOS: 4 ADMISSION DATE:  11/09/2020, CONSULTATION DATE:  11/09/20 REFERRING MD:  Regenia Skeeter  CHIEF COMPLAINT:  Chest, back, abd pain   Brief History   34 y/o F who was admitted 11/25 with large type B aortic dissection.  She initially presented to [facility] ED 11/23 with N/V, abd pain.  She was hypertensive and it was felt this was due to her missing antihypertensive meds due to vomiting.  She was discharged with bentyl, zofran and instructed to follow up with PCP. 11/24 and 11/25 she had ongoing pain which worsened 11/25 and radiated lower into her back prompting her to return to [facility] ED.  She had CT abdomen which revealed a descending thoracic aortic dissection.  Case was discussed with Dr. Luan Pulling of vascular surgery who recommended transfer to [facility] and tight BP control for the night.  Past Medical History  has Alcoholic pancreatitis; SIRS (systemic inflammatory response syndrome) ([place]); Hypokalemia; Hypomagnesemia; Prolonged QT interval; Essential hypertension; Depression; Tobacco use; Hypocalcemia; Acute alcoholic pancreatitis; Thrombocytopenia ([place]); Normocytic anemia; Descending thoracic dissection ([place]); Hypertensive emergency; Nausea; and Back pain on their problem list.  Significant Hospital Events   11/25 Admit 11/26 Patient with ongoing mild back pain and headache 11/27 Remains on Esmolol gtt, BP/HR controlled   Consults:  Vascular surgery  Procedures:  Art line 11/25 > out  Significant Diagnostic Tests:   CT A / P 11/25 > descending thoracic aorta dissection, trace left pleural effusion, trace haziness surrounding the uncinate process of pancreas.  CTA chest 11/25 > type B thoracic aortic dissection involving the entire abdominal aorta and possibly through to the origin of the right common femoral artery, possible mild acute pancreatitis.  Micro Data:  Flu 11/25 > neg. COVID 11/25 > neg.  Antimicrobials:      Interim  history/subjective:  BP better controlled today, off esmolol gtt.  Objective:  Blood pressure 118/68, pulse 78, temperature 98.4 F (36.9 C), temperature source Oral, resp. rate 20, height 5\' 9"  (1.753 m), weight 110.6 kg, SpO2 98 %.        Intake/Output Summary (Last 24 hours) at 11/13/2020 0807 Last data filed at 11/13/2020 0700 Gross per 24 hour  Intake 1385.7 ml  Output 2301 ml  Net -915.3 ml   Filed Weights   11/11/20 0400 11/12/20 0500 11/13/20 0500  Weight: 107.6 kg 111.2 kg 110.6 kg    Examination: General: adult female sitting up in bed in NAD HEENT: MM pink/moist, anicteric  Neuro: Somnolent, speech soft.  Patient is disoriented.  No focal deficits. CV: s1s2 RRR, no m/r/g PULM: non-labored on RA, lungs bilaterally clear  GI: soft, bsx4 active  Extremities: warm/dry, no edema  Skin: no rashes or lesions  Assessment & Plan:   Type B aortic dissection likely secondary to uncontrolled HTN  Improved BP control, trial titrating off Labetolol and transition to oral agents P: -SBP goal <120, HR <80 per VVS  -continue po hydralazine, cozaar, Norvasc, Metoprolol -Pt eager for discharge secondary to social issues, per vascular monitor one more day in ICU in the event BP climbs and needs resumption of Labetolol gtt    Agitation Improved, oriented through frustrated today P: -d/c dexmedetomidine -Continue CIWA triggered lorazepam -We will add Seroquel.   Hx depression, EtOH use, tobacco use -continue home gabapentin, thiamine, folate, nicotine patch  Best Practice:  Diet: Heart healthy  Pain/Anxiety/Delirium protocol (if indicated): Fentanyl PRN.  CIWA triggered lorazepam.  Started Seroquel. VAP protocol (  if indicated): N/A. DVT prophylaxis: SCD's only. GI prophylaxis: None. Glucose control: None. Mobility: Bedrest. Last date of multidisciplinary goals of care discussion: Pending  Family and staff present: Pending. Summary of discussion: Pending. Follow up  goals of care discussion due: 12/1. Code Status: Full. Family Communication: Patient able to communicate and updated on plan of care Disposition: ICU.  Labs   CBC: Recent Labs  Lab 11/07/20 2240 11/09/20 1733 11/10/20 0645 11/11/20 0430 11/12/20 0110  WBC 10.6* 8.5 8.5 8.4 8.4  NEUTROABS 8.7* 6.9  --   --   --   HGB 9.5* 9.5* 8.5* 8.4* 8.5*  HCT 31.5* 31.9* 28.4* 28.2* 28.3*  MCV 78.8* 80.2 78.5* 79.7* 79.3*  PLT 211 191 194 199 976   Basic Metabolic Panel: Recent Labs  Lab 11/07/20 2240 11/09/20 1733 11/10/20 0645 11/11/20 0430 11/12/20 0110  NA 138 134* 132* 133* 132*  K 2.8* 3.0* 3.5 3.6 4.2  CL 104 99 102 103 104  CO2 21* 24 20* 19* 17*  GLUCOSE 146* 104* 96 109* 94  BUN 7 9 8 8 8   CREATININE 0.78 0.81 0.78 0.81 0.78  CALCIUM 9.0 9.1 8.8* 8.8* 9.0  MG 1.4*  --  1.8 1.9 1.9  PHOS  --   --  3.5  --   --    GFR: Estimated Creatinine Clearance: 131.4 mL/min (by C-G formula based on SCr of 0.78 mg/dL). Recent Labs  Lab 11/09/20 1733 11/10/20 0645 11/11/20 0430 11/12/20 0110  WBC 8.5 8.5 8.4 8.4   Liver Function Tests: Recent Labs  Lab 11/07/20 2240 11/09/20 1733 11/12/20 0110  AST 22 15 14*  ALT 16 14 12   ALKPHOS 46 48 41  BILITOT 1.2 2.1* 0.7  PROT 7.7 7.8 6.7  ALBUMIN 3.9 3.7 2.9*   Recent Labs  Lab 11/07/20 2240 11/09/20 1733  LIPASE 28 125*   No results for input(s): AMMONIA in the last 168 hours. ABG    Component Value Date/Time   TCO2 25 08/09/2013 1333    Coagulation Profile: No results for input(s): INR, PROTIME in the last 168 hours. Cardiac Enzymes: No results for input(s): CKTOTAL, CKMB, CKMBINDEX, TROPONINI in the last 168 hours. HbA1C: No results found for: HGBA1C CBG: No results for input(s): GLUCAP in the last 168 hours.   CRITICAL CARE Performed by: Otilio Carpen Robin Guerrero   Total critical care time: 35 minutes  Critical care time was exclusive of separately billable procedures and treating other patients.  Critical  care was necessary to treat or prevent imminent or life-threatening deterioration.  Critical care was time spent personally by me on the following activities: development of treatment plan with patient and/or surrogate as well as nursing, discussions with consultants, evaluation of patient's response to treatment, examination of patient, obtaining history from patient or surrogate, ordering and performing treatments and interventions, ordering and review of laboratory studies, ordering and review of radiographic studies, pulse oximetry, re-evaluation of patient's condition and participation in multidisciplinary rounds.   Otilio Carpen Robin Pitner, PA-C  PCCM  Pager# [phone], if no answer [phone]  .

## 2020-11-13 NOTE — Plan of Care (Signed)

## 2020-11-13 NOTE — Progress Notes (Signed)
Pt states she doesn't want an IV placed right now.  RN advised to put in new consult when patient is ready.

## 2020-11-14 ENCOUNTER — Encounter (HOSPITAL_COMMUNITY): Payer: Self-pay | Admitting: Pulmonary Disease

## 2020-11-14 DIAGNOSIS — E876 Hypokalemia: Secondary | ICD-10-CM

## 2020-11-14 DIAGNOSIS — G4700 Insomnia, unspecified: Secondary | ICD-10-CM

## 2020-11-14 DIAGNOSIS — I161 Hypertensive emergency: Secondary | ICD-10-CM

## 2020-11-14 DIAGNOSIS — I7101 Dissection of thoracic aorta: Secondary | ICD-10-CM | POA: Diagnosis not present

## 2020-11-14 LAB — CBC
HCT: 27.8 % — ABNORMAL LOW (ref 36.0–46.0)
Hemoglobin: 8.3 g/dL — ABNORMAL LOW (ref 12.0–15.0)
MCH: 23.3 pg — ABNORMAL LOW (ref 26.0–34.0)
MCHC: 29.9 g/dL — ABNORMAL LOW (ref 30.0–36.0)
MCV: 78.1 fL — ABNORMAL LOW (ref 80.0–100.0)
Platelets: 231 10*3/uL (ref 150–400)
RBC: 3.56 MIL/uL — ABNORMAL LOW (ref 3.87–5.11)
RDW: 19.8 % — ABNORMAL HIGH (ref 11.5–15.5)
WBC: 8.6 10*3/uL (ref 4.0–10.5)
nRBC: 0 % (ref 0.0–0.2)

## 2020-11-14 LAB — BASIC METABOLIC PANEL
Anion gap: 13 (ref 5–15)
BUN: 6 mg/dL (ref 6–20)
CO2: 19 mmol/L — ABNORMAL LOW (ref 22–32)
Calcium: 9.1 mg/dL (ref 8.9–10.3)
Chloride: 105 mmol/L (ref 98–111)
Creatinine, Ser: 0.96 mg/dL (ref 0.44–1.00)
GFR, Estimated: >60 mL/min (ref >60)
Glucose, Bld: 92 mg/dL (ref 70–99)
Potassium: 3.2 mmol/L — ABNORMAL LOW (ref 3.5–5.1)
Sodium: 137 mmol/L (ref 135–145)

## 2020-11-14 MED ORDER — MAGNESIUM SULFATE 2 GM/50ML IV SOLN: 2.0000 g | Freq: Once | INTRAVENOUS | Status: DC

## 2020-11-14 MED ORDER — ALPRAZOLAM 0.5 MG PO TABS: 0.5000 mg | ORAL_TABLET | Freq: Every evening | ORAL | Status: DC | PRN

## 2020-11-14 MED ORDER — POTASSIUM CHLORIDE CRYS ER 20 MEQ PO TBCR
40.0000 meq | EXTENDED_RELEASE_TABLET | Freq: Once | ORAL | Status: AC
  Administered 2020-11-14: 40 meq via ORAL
  Filled 2020-11-14: qty 2

## 2020-11-14 MED ORDER — PRAZOSIN HCL 2 MG PO CAPS
2.0000 mg | ORAL_CAPSULE | Freq: Every day | ORAL | Status: DC
  Administered 2020-11-14 – 2020-11-17 (×4): 2 mg via ORAL
  Filled 2020-11-14 (×5): qty 1

## 2020-11-14 MED ORDER — MAGNESIUM OXIDE 400 (241.3 MG) MG PO TABS
400.0000 mg | ORAL_TABLET | Freq: Two times a day (BID) | ORAL | Status: AC
  Administered 2020-11-14 (×2): 400 mg via ORAL
  Filled 2020-11-14 (×2): qty 1

## 2020-11-14 MED ORDER — CARVEDILOL 25 MG PO TABS
25.0000 mg | ORAL_TABLET | Freq: Two times a day (BID) | ORAL | Status: DC
  Administered 2020-11-14 – 2020-11-18 (×8): 25 mg via ORAL
  Filled 2020-11-14 (×8): qty 1

## 2020-11-14 MED ORDER — METOPROLOL SUCCINATE ER 50 MG PO TB24: 100.0000 mg | ORAL_TABLET | Freq: Two times a day (BID) | ORAL | Status: DC

## 2020-11-14 MED ORDER — HYDROCHLOROTHIAZIDE 50 MG PO TABS
50.0000 mg | ORAL_TABLET | Freq: Every day | ORAL | Status: DC
  Administered 2020-11-15: 50 mg via ORAL
  Filled 2020-11-14 (×2): qty 1
  Filled 2020-11-14: qty 2

## 2020-11-14 NOTE — Progress Notes (Signed)
  Progress Note    11/14/2020 7:59 AM * No surgery found *  Subjective:  BP increased to 150 overnight. Patient refusing IV access. Some improvement in BP with clonidine.    Vitals:   11/14/20 0600 11/14/20 0739  BP: (!) 151/97   Pulse: 93   Resp: 16   Temp:  98.3 F (36.8 C)  SpO2: 94%    Physical Exam Lungs:  Non labored Extremities:  Symmetrical radial and DP pulses Abdomen:  Soft, NT, ND Neurologic: Somnolent this morning  CBC    Component Value Date/Time   WBC 8.6 11/14/2020 0131   RBC 3.56 (L) 11/14/2020 0131   HGB 8.3 (L) 11/14/2020 0131   HCT 27.8 (L) 11/14/2020 0131   PLT 231 11/14/2020 0131   MCV 78.1 (L) 11/14/2020 0131   MCH 23.3 (L) 11/14/2020 0131   MCHC 29.9 (L) 11/14/2020 0131   RDW 19.8 (H) 11/14/2020 0131   LYMPHSABS 0.9 11/09/2020 1733   MONOABS 0.7 11/09/2020 1733   EOSABS 0.0 11/09/2020 1733   BASOSABS 0.0 11/09/2020 1733    BMET    Component Value Date/Time   NA 137 11/14/2020 0131   K 3.2 (L) 11/14/2020 0131   CL 105 11/14/2020 0131   CO2 19 (L) 11/14/2020 0131   GLUCOSE 92 11/14/2020 0131   BUN 6 11/14/2020 0131   CREATININE 0.96 11/14/2020 0131   CALCIUM 9.1 11/14/2020 0131   GFRNONAA >60 11/14/2020 0131   GFRAA >60 08/27/2020 0351    INR    Component Value Date/Time   INR 1.1 08/27/2020 0953     Intake/Output Summary (Last 24 hours) at 11/14/2020 0759 Last data filed at 11/14/2020 0600 Gross per 24 hour  Intake 915.44 ml  Output 100 ml  Net 815.44 ml     Assessment/Plan:  34 y.o. female  With type B aortic dissection  No signs or symptoms of malperfusion on physical exam; no indication for repair at this time HR and BP not controlled overnight; continue HR<80 and SBP <120 Needs IV access and improved control. Diuretic may help with peripheral edema and BP. Not yet ready for discharge. Reviewed in detail with the patient.   [name]. Stanford Breed, MD [facility] Phone  Number: [phone] 11/14/2020 8:00 AM

## 2020-11-14 NOTE — Progress Notes (Signed)
[place] Progress Note Patient Name: Robin Guerrero DOB: 01-Feb-1986 MRN: [id number]   Date of Service  11/14/2020  HPI/Events of Note  Notified of BP and HR not at goal. Patient refusing IV. Clonidine prn given. K 3.2  eICU Interventions  Ordered K 40 meqs PO x 1     Intervention Category Major Interventions: Electrolyte abnormality - evaluation and management;Hypertension - evaluation and management  Judd Lien 11/14/2020, 4:57 AM

## 2020-11-14 NOTE — Plan of Care (Signed)

## 2020-11-14 NOTE — Progress Notes (Addendum)
NAMETemiloluwa Guerrero, MRN:  [id number], DOB:  1986/10/24, LOS: 5 ADMISSION DATE:  11/09/2020, CONSULTATION DATE:  11/09/20 REFERRING MD:  Regenia Skeeter  CHIEF COMPLAINT:  Chest, back, abd pain   Brief History   34 y/o F who was admitted 11/25 with large type B aortic dissection.  She initially presented to [facility] ED 11/23 with N/V, abd pain.  She was hypertensive and it was felt this was due to her missing antihypertensive meds due to vomiting.  She was discharged with bentyl, zofran and instructed to follow up with PCP. 11/24 and 11/25 she had ongoing pain which worsened 11/25 and radiated lower into her back prompting her to return to [facility] ED.  She had CT abdomen which revealed a descending thoracic aortic dissection.  Case was discussed with Dr. Luan Pulling of vascular surgery who recommended transfer to [facility] and tight BP control for the night.  Past Medical History  has Alcoholic pancreatitis; SIRS (systemic inflammatory response syndrome) ([place]); Hypokalemia; Hypomagnesemia; Prolonged QT interval; Essential hypertension; Depression; Tobacco use; Hypocalcemia; Acute alcoholic pancreatitis; Thrombocytopenia ([place]); Normocytic anemia; Descending thoracic dissection ([place]); Hypertensive emergency; Nausea; and Back pain on their problem list.  Significant Hospital Events   11/25 Admit 11/26 Patient with ongoing mild back pain and headache 11/27 Remains on Esmolol gtt, BP/HR controlled  11/28 off cardizem and esmolol gtt 11/29 precedex off in am   Consults:  Vascular surgery  Procedures:  Art line 11/25 > out  Significant Diagnostic Tests:   CT A / P 11/25 > descending thoracic aorta dissection, trace left pleural effusion, trace haziness surrounding the uncinate process of pancreas.  CTA chest 11/25 > type B thoracic aortic dissection involving the entire abdominal aorta and possibly through to the origin of the right common femoral artery, possible mild acute pancreatitis.  Micro Data:  Flu 11/25  > neg. COVID 11/25 > neg.  Antimicrobials:   n/a  Interim history/subjective:  Remains with no PIV Slept poorly overnight.  Complains of slightly worse back pain (lower)- she feels like its due to being in the bed. SBP 120-158 overnight, HR remains NSR in the 80-90s  Objective:  Blood pressure (!) 147/93, pulse 92, temperature 98.3 F (36.8 C), temperature source Oral, resp. rate 16, height 5\' 9"  (1.753 m), weight 111.2 kg, SpO2 94 %.        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 Carlis Abbott and pharmacy- will try and change patient to a medication regimen that will be easier for her to comply with and be affordable.  Will continue norvasc, losartan with HCTZ (on $4  [employer] list), and coreg BID instead of apresoline and toprol XL - unable to give any IV prn's still as she refuses to have PIV replaced.  Clonidine PO prn  - Pt remains eager for discharge secondary to social issues  Agitation Improved, oriented through frustrated today P: - reportedly delirious after ativan, may consider additional xanax if needed - continue seroquel 25 mg BID - prn xanax qhs  for sleep  Hx depression, EtOH use, tobacco use -continue home gabapentin, thiamine, folate, nicotine patch  Hypokalemia Hypomagnesemia P:  - s/p KCL replete - Mag 1.6 yesterday, replete with mag ox 400 mg BID - BMET in am   Microcytic anemia P:  - H/H stable, transfuse for Hgb < 7 - Trend CBC - continue ferrous sulfate  NGMA- unclear etiology - stable, trend BMET  Best Practice:  Diet: Heart healthy  Pain/Anxiety/Delirium protocol (if indicated): see above VAP protocol (if indicated): N/A. DVT prophylaxis: SCD's only. GI prophylaxis: None. Glucose control: None. Mobility: as tolerated Last date of multidisciplinary goals of care discussion: Pending  Family and staff present: Pending. Summary of discussion: Pending. Follow up goals of care discussion due: 12/1. Code Status: Full. Family Communication: Patient updated on plan of care Disposition: ICU.  Labs   CBC: Recent Labs  Lab 11/07/20 2240 11/07/20 2240 11/09/20 1733 11/09/20 1733 11/10/20 0645 11/11/20 0430 11/12/20 0110 11/13/20 0811 11/14/20 0131  WBC 10.6*   < > 8.5   < > 8.5 8.4 8.4 6.5 8.6  NEUTROABS 8.7*  --  6.9  --   --   --   --   --   --   HGB 9.5*   < > 9.5*   < > 8.5* 8.4* 8.5* 8.9* 8.3*  HCT 31.5*   < > 31.9*   < > 28.4* 28.2* 28.3* 29.4* 27.8*  MCV 78.8*   < > 80.2   < > 78.5* 79.7* 79.3* 77.8* 78.1*  PLT 211   < > 191   < > 194 199 202 PLATELET CLUMPS NOTED ON SMEAR, UNABLE TO ESTIMATE 231   < > = values in this interval not displayed.   Basic Metabolic Panel: Recent Labs  Lab 11/07/20 2240 11/09/20 1733 11/10/20 0645 11/11/20 0430 11/12/20 0110 11/13/20 0811 11/14/20 0131  NA 138   < > 132* 133* 132* 134* 137  K 2.8*   < > 3.5 3.6 4.2 3.6 3.2*  CL 104   < > 102 103 104 104 105  CO2 21*   < > 20* 19* 17* 18* 19*  GLUCOSE 146*   < > 96 109* 94 119* 92  BUN 7   < > 8 8 8 9 6   CREATININE 0.78   < > 0.78 0.81 0.78 0.99 0.96  CALCIUM 9.0   < > 8.8* 8.8* 9.0 9.3 9.1  MG 1.4*   --  1.8 1.9 1.9 1.6*  --   PHOS  --   --  3.5  --   --   --   --    < > = values in this interval not displayed.   GFR: Estimated Creatinine Clearance: 109.8 mL/min (by C-G formula based on SCr of 0.96 mg/dL). Recent Labs  Lab 11/11/20 0430 11/12/20 0110 11/13/20 0811 11/14/20 0131  WBC 8.4 8.4 6.5 8.6   Liver Function Tests: Recent Labs  Lab 11/07/20 2240 11/09/20 1733 11/12/20 0110 11/13/20 0811  AST 22 15 14* 23  ALT 16 14 12 16   ALKPHOS 46 48  41 44  BILITOT 1.2 2.1* 0.7 1.0  PROT 7.7 7.8 6.7 6.8  ALBUMIN 3.9 3.7 2.9* 3.0*   Recent Labs  Lab 11/07/20 2240 11/09/20 1733  LIPASE 28 125*   No results for input(s): AMMONIA in the last 168 hours. ABG    Component Value Date/Time   TCO2 25 08/09/2013 1333    Coagulation Profile: No results for input(s): INR, PROTIME in the last 168 hours. Cardiac Enzymes: No results for input(s): CKTOTAL, CKMB, CKMBINDEX, TROPONINI in the last 168 hours. HbA1C: No results found for: HGBA1C CBG: No results for input(s): GLUCAP in the last 168 hours.    Kennieth Rad, ACNP [facility] 11/14/2020, 8:13 AM  See Shea Evans for personal pager PCCM on call pager [phone]

## 2020-11-15 ENCOUNTER — Inpatient Hospital Stay: Payer: Medicaid Other

## 2020-11-15 DIAGNOSIS — F320 Major depressive disorder, single episode, mild: Secondary | ICD-10-CM

## 2020-11-15 DIAGNOSIS — Z720 Tobacco use: Secondary | ICD-10-CM

## 2020-11-15 DIAGNOSIS — F1010 Alcohol abuse, uncomplicated: Secondary | ICD-10-CM

## 2020-11-15 DIAGNOSIS — D509 Iron deficiency anemia, unspecified: Secondary | ICD-10-CM

## 2020-11-15 DIAGNOSIS — I10 Essential (primary) hypertension: Secondary | ICD-10-CM

## 2020-11-15 LAB — CBC
HCT: 28.5 % — ABNORMAL LOW (ref 36.0–46.0)
Hemoglobin: 9.1 g/dL — ABNORMAL LOW (ref 12.0–15.0)
MCH: 24.5 pg — ABNORMAL LOW (ref 26.0–34.0)
MCHC: 31.9 g/dL (ref 30.0–36.0)
MCV: 76.8 fL — ABNORMAL LOW (ref 80.0–100.0)
Platelets: 283 10*3/uL (ref 150–400)
RBC: 3.71 MIL/uL — ABNORMAL LOW (ref 3.87–5.11)
RDW: 20 % — ABNORMAL HIGH (ref 11.5–15.5)
WBC: 8 10*3/uL (ref 4.0–10.5)
nRBC: 0 % (ref 0.0–0.2)

## 2020-11-15 LAB — BASIC METABOLIC PANEL
Anion gap: 13 (ref 5–15)
BUN: 7 mg/dL (ref 6–20)
CO2: 22 mmol/L (ref 22–32)
Calcium: 9.5 mg/dL (ref 8.9–10.3)
Chloride: 101 mmol/L (ref 98–111)
Creatinine, Ser: 0.83 mg/dL (ref 0.44–1.00)
GFR, Estimated: >60 mL/min (ref >60)
Glucose, Bld: 97 mg/dL (ref 70–99)
Potassium: 4.1 mmol/L (ref 3.5–5.1)
Sodium: 136 mmol/L (ref 135–145)

## 2020-11-15 LAB — MAGNESIUM: Magnesium: 1.8 mg/dL (ref 1.7–2.4)

## 2020-11-15 MED ORDER — CLONIDINE HCL 0.1 MG PO TABS
0.1000 mg | ORAL_TABLET | Freq: Three times a day (TID) | ORAL | Status: DC
  Administered 2020-11-15 – 2020-11-18 (×10): 0.1 mg via ORAL
  Filled 2020-11-15 (×10): qty 1

## 2020-11-15 NOTE — Progress Notes (Signed)
[facility]  Subjective  - Still having the same upper back pain as when she arrived.  She states it comes and goes.   Objective 135/73 92 98.3 F (36.8 C) (Oral) 19 98%  Intake/Output Summary (Last 24 hours) at 11/15/2020 0729 Last data filed at 11/15/2020 0300 Gross per 24 hour  Intake 200 ml  Output 1100 ml  Net -900 ml    Moving all ext. Palpable UE and LE pulses B UE edema secondary to IV infiltration Abdomin soft NTTP Heart RRR, BP systolic 024'O  Lungs non labored breathing  Assessment/Planning: POD # 1 34 y.o. female  With type B aortic dissection  Will discuss pain pattern with DR. Hawken possible repeat CTA? Continue BP control with PO medication patient refusing IV secondary to pain from previous infiltration.  Robin Guerrero 11/15/2020 7:29 AM --  Laboratory Lab Results: Recent Labs    11/14/20 0131 11/15/20 0438  WBC 8.6 8.0  HGB 8.3* 9.1*  HCT 27.8* 28.5*  PLT 231 283   BMET Recent Labs    11/14/20 0131 11/15/20 0438  NA 137 136  K 3.2* 4.1  CL 105 101  CO2 19* 22  GLUCOSE 92 97  BUN 6 7  CREATININE 0.96 0.83  CALCIUM 9.1 9.5    COAG Lab Results  Component Value Date   INR 1.1 08/27/2020   No results found for: PTT

## 2020-11-15 NOTE — Progress Notes (Signed)
Pt voiced concern over her pain on the back, she stated " its like the pain I felt when I first came to ED, it feels like back pain, but it also feels like I pulled a muscle in my chest. Its coming and going". When asked if she is hurting at the moment she said no. BP 133/81, HR 87. Still refusing iv access, had given prn catapres for her BP that didn't help. Paged on call Elink MD. Awaiting call back.

## 2020-11-15 NOTE — Plan of Care (Signed)

## 2020-11-15 NOTE — Progress Notes (Signed)
Pt received from [place], AO x4. CHG bath completed, connected to tele and ccmd notified. Oriented pt to room and call bell system. Will continue to monitor.

## 2020-11-15 NOTE — Progress Notes (Signed)
PROGRESS NOTE    Robin Guerrero  XTK:[id number] DOB: 02-08-1986 DOA: 11/09/2020 PCP: Trey Sailors, PA     Brief Narrative:  Robin Guerrero is a 34 y.o. BF PMHx Depression, HTN, Aortic dissection, Prolonged QT interval, EtOH abuse, Tobacco abuse, EtOH pancreatitis, She was admitted 11/25 with large type B aortic dissection involving the entire abdominal aorta and possibly through to the origin of the right common femoral artery.  She initially presented to [facility] ED 11/23 with N/V, abd pain.  She was hypertensive and it was felt this was due to her missing antihypertensive meds due to vomiting.  She was discharged with bentyl, zofran and instructed to follow up with PCP.  11/24 and 11/25 she had ongoing pain which worsened 11/25 and radiated lower into her back prompting her to return to [facility] ED.  She had CT abdomen which revealed a descending thoracic aortic dissection.  Case was discussed with Dr. Luan Pulling of vascular surgery who recommended transfer to [facility] and tight BP control for the night.   Subjective: Afebrile overnight A/O x4, negative chest pain, negative back pain, negative N/V.  Spoke at length with patient concerning what aortic dissection actually meant, her future risk factors.    Assessment & Plan: Covid vaccination; negative vaccination   Active Problems:   Descending thoracic dissection (HCC)  Type B aortic dissection  -Most likely likely secondary to uncontrolled malignant HTN  -12/1 BP still uncontrolled.  SBP goal<120 -Amlodipine 10 mg daily -Coreg 25 mg BID -12/1 clonidine 0.1 mg TID -Hydralazine 50 mg TID -HCTZ 50 mg daily -Losartan 100 mg daily -Prazosin 2 mg daily -12/1 consult nutrition; education on heart healthy diet (Na<2 g/day)  Malignant HTN -See aortic dissection  Agitation -Xanax 0.5 mg PRN -Seroquel 25 mg BID  Hx depression, EtOH use, tobacco use -continue home gabapentin, thiamine, folate, nicotine  patch  Hypokalemia -Potassium goal> 4 - s/p KCL replete  Hypomagnesemia -Magnesium goal> 2 - Mag 1.6 yesterday, replete with mag ox 400 mg BID   Microcytic anemia - H/H stable, transfuse for Hgb < 7 - Trend CBC - continue ferrous sulfate  Anion gap metabolic acidosis -99/2 calculated AG= 17.1  Unvaccinated Covid patient -After counseling patient on the risks associated with Covid and her current diagnosis patient agreed to accept Covid vaccination prior to discharge.    DVT prophylaxis: SCD Code Status: Full Family Communication: 12/1 family at bedside for explanation of plan of care all questions answered Status is: Inpatient    Dispo: The patient is from: Home              Anticipated d/c is to: Home              Anticipated d/c date is: 12/4              Patient currently unstable      Consultants:  Vascular surgery PCCM  Procedures/Significant Events:  11/25 Admit  11/26 Patient with ongoing mild back pain and headache 11/27 Remains on Esmolol gtt, BP/HR controlled  11/28 off cardizem and esmolol gtt 11/29 precedex off in am     CT A / P 11/25 > descending thoracic aorta dissection, trace left pleural effusion, trace haziness surrounding the uncinate process of pancreas. CTA chest 11/25 > type B thoracic aortic dissection involving the entire abdominal aorta and possibly through to the origin of the right common femoral artery, possible mild acute pancreatitis.  I have personally reviewed and interpreted all radiology studies and my findings  are as above.  VENTILATOR SETTINGS:    Cultures 11/25 flu > neg. 11/25 COVID > neg.  Antimicrobials:    Devices    LINES / TUBES:  Art line 11/25 > out    Continuous Infusions: . sodium chloride    . labetalol (NORMODYNE) infusion 5 mg/mL Stopped (11/13/20 0834)     Objective: Vitals:   11/15/20 0335 11/15/20 0647 11/15/20 0711 11/15/20 0824  BP: 133/82  135/73 (!) 146/77  Pulse: 94   92   Resp: 20  19   Temp: 98.2 F (36.8 C)  98.3 F (36.8 C)   TempSrc: Oral  Oral   SpO2: 96%  98%   Weight:  105.6 kg    Height:        Intake/Output Summary (Last 24 hours) at 11/15/2020 4401 Last data filed at 11/15/2020 0300 Gross per 24 hour  Intake 200 ml  Output 600 ml  Net -400 ml   Filed Weights   11/13/20 0500 11/14/20 0500 11/15/20 0647  Weight: 110.6 kg 111.2 kg 105.6 kg    Examination:  General: A/O x4, No acute respiratory distress Eyes: negative scleral hemorrhage, negative anisocoria, negative icterus ENT: Negative Runny nose, negative gingival bleeding, Neck:  Negative scars, masses, torticollis, lymphadenopathy, JVD Lungs: Clear to auscultation bilaterally without wheezes or crackles Cardiovascular: Regular rate and rhythm without murmur gallop or rub normal S1 and S2 Abdomen: OBESE, negative abdominal pain, nondistended, positive soft, bowel sounds, no rebound, no ascites, no appreciable mass Extremities: No significant cyanosis, clubbing, or edema bilateral lower extremities Skin: Negative rashes, lesions, ulcers Psychiatric:  Negative depression, negative anxiety, negative fatigue, negative mania  Central nervous system:  Cranial nerves II through XII intact, tongue/uvula midline, all extremities muscle strength 5/5, sensation intact throughout, negative dysarthria, negative expressive aphasia, negative receptive aphasia.  .     Data Reviewed: Care during the described time interval was provided by me .  I have reviewed this patient's available data, including medical history, events of note, physical examination, and all test results as part of my evaluation.  CBC: Recent Labs  Lab 11/09/20 1733 11/10/20 0645 11/11/20 0430 11/12/20 0110 11/13/20 0811 11/14/20 0131 11/15/20 0438  WBC 8.5   < > 8.4 8.4 6.5 8.6 8.0  NEUTROABS 6.9  --   --   --   --   --   --   HGB 9.5*   < > 8.4* 8.5* 8.9* 8.3* 9.1*  HCT 31.9*   < > 28.2* 28.3* 29.4* 27.8*  28.5*  MCV 80.2   < > 79.7* 79.3* 77.8* 78.1* 76.8*  PLT 191   < > 199 202 PLATELET CLUMPS NOTED ON SMEAR, UNABLE TO ESTIMATE 231 283   < > = values in this interval not displayed.   Basic Metabolic Panel: Recent Labs  Lab 11/10/20 0645 11/10/20 0645 11/11/20 0430 11/12/20 0110 11/13/20 0811 11/14/20 0131 11/15/20 0438  NA 132*   < > 133* 132* 134* 137 136  K 3.5   < > 3.6 4.2 3.6 3.2* 4.1  CL 102   < > 103 104 104 105 101  CO2 20*   < > 19* 17* 18* 19* 22  GLUCOSE 96   < > 109* 94 119* 92 97  BUN 8   < > 8 8 9 6 7   CREATININE 0.78   < > 0.81 0.78 0.99 0.96 0.83  CALCIUM 8.8*   < > 8.8* 9.0 9.3 9.1 9.5  MG 1.8  --  1.9 1.9 1.6*  --   --   PHOS 3.5  --   --   --   --   --   --    < > = values in this interval not displayed.   GFR: Estimated Creatinine Clearance: 123.6 mL/min (by C-G formula based on SCr of 0.83 mg/dL). Liver Function Tests: Recent Labs  Lab 11/09/20 1733 11/12/20 0110 11/13/20 0811  AST 15 14* 23  ALT 14 12 16   ALKPHOS 48 41 44  BILITOT 2.1* 0.7 1.0  PROT 7.8 6.7 6.8  ALBUMIN 3.7 2.9* 3.0*   Recent Labs  Lab 11/09/20 1733  LIPASE 125*   No results for input(s): AMMONIA in the last 168 hours. Coagulation Profile: No results for input(s): INR, PROTIME in the last 168 hours. Cardiac Enzymes: No results for input(s): CKTOTAL, CKMB, CKMBINDEX, TROPONINI in the last 168 hours. BNP (last 3 results) No results for input(s): PROBNP in the last 8760 hours. HbA1C: No results for input(s): HGBA1C in the last 72 hours. CBG: No results for input(s): GLUCAP in the last 168 hours. Lipid Profile: No results for input(s): CHOL, HDL, LDLCALC, TRIG, CHOLHDL, LDLDIRECT in the last 72 hours. Thyroid Function Tests: No results for input(s): TSH, T4TOTAL, FREET4, T3FREE, THYROIDAB in the last 72 hours. Anemia Panel: No results for input(s): VITAMINB12, FOLATE, FERRITIN, TIBC, IRON, RETICCTPCT in the last 72 hours. Sepsis Labs: No results for input(s):  PROCALCITON, LATICACIDVEN in the last 168 hours.  Recent Results (from the past 240 hour(s))  Resp Panel by RT-PCR (Flu A&B, Covid) Nasopharyngeal Swab     Status: None   Collection Time: 11/09/20  7:59 PM   Specimen: Nasopharyngeal Swab; Nasopharyngeal(NP) swabs in vial transport medium  Result Value Ref Range Status   SARS Coronavirus 2 by RT PCR NEGATIVE NEGATIVE Final    Comment: (NOTE) SARS-CoV-2 target nucleic acids are NOT DETECTED.  The SARS-CoV-2 RNA is generally detectable in upper respiratory specimens during the acute phase of infection. The lowest concentration of SARS-CoV-2 viral copies this assay can detect is 138 copies/mL. A negative result does not preclude SARS-Cov-2 infection and should not be used as the sole basis for treatment or other patient management decisions. A negative result may occur with  improper specimen collection/handling, submission of specimen other than nasopharyngeal swab, presence of viral mutation(s) within the areas targeted by this assay, and inadequate number of viral copies(<138 copies/mL). A negative result must be combined with clinical observations, patient history, and epidemiological information. The expected result is Negative.  Fact Sheet for Patients:  EntrepreneurPulse.com.au  Fact Sheet for Healthcare Providers:  IncredibleEmployment.be  This test is no t yet approved or cleared by the [place] FDA and  has been authorized for detection and/or diagnosis of SARS-CoV-2 by FDA under an Emergency Use Authorization (EUA). This EUA will remain  in effect (meaning this test can be used) for the duration of the COVID-19 declaration under Section 564(b)(1) of the Act, 21 U.S.C.section 360bbb-3(b)(1), unless the authorization is terminated  or revoked sooner.       Influenza A by PCR NEGATIVE NEGATIVE Final   Influenza B by PCR NEGATIVE NEGATIVE Final    Comment: (NOTE) The Xpert Xpress  SARS-CoV-2/FLU/RSV plus assay is intended as an aid in the diagnosis of influenza from Nasopharyngeal swab specimens and should not be used as a sole basis for treatment. Nasal washings and aspirates are unacceptable for Xpert Xpress SARS-CoV-2/FLU/RSV testing.  Fact Sheet for Patients: EntrepreneurPulse.com.au  Fact Sheet  for Healthcare Providers: IncredibleEmployment.be  This test is not yet approved or cleared by the [place] and has been authorized for detection and/or diagnosis of SARS-CoV-2 by FDA under an Emergency Use Authorization (EUA). This EUA will remain in effect (meaning this test can be used) for the duration of the COVID-19 declaration under Section 564(b)(1) of the Act, 21 U.S.C. section 360bbb-3(b)(1), unless the authorization is terminated or revoked.  Performed at [facility], [place] [address], [place], [place] [address]   MRSA PCR Screening     Status: None   Collection Time: 11/09/20 11:07 PM   Specimen: Nasopharyngeal  Result Value Ref Range Status   MRSA by PCR NEGATIVE NEGATIVE Final    Comment:        The GeneXpert MRSA Assay (FDA approved for NASAL specimens only), is one component of a comprehensive MRSA colonization surveillance program. It is not intended to diagnose MRSA infection nor to guide or monitor treatment for MRSA infections. Performed at [facility], [place] [address], [place], [place] [address]          Radiology Studies: No results found.      Scheduled Meds: . amLODipine  10 mg Oral Daily  . carvedilol  25 mg Oral BID WC  . Chlorhexidine Gluconate Cloth  6 each Topical Daily  . dicyclomine  20 mg Oral BID  . ferrous sulfate  325 mg Oral Daily  . folic acid  1 mg Oral Daily  . hydrALAZINE  50 mg Oral Q8H  . hydrochlorothiazide  50 mg Oral Daily  . losartan  100 mg Oral Daily  . multivitamin with minerals  1 tablet Oral Daily  . nicotine  14 mg  Transdermal Daily  . prazosin  2 mg Oral QHS  . QUEtiapine  25 mg Oral BID  . thiamine  100 mg Oral Daily   Or  . thiamine  100 mg Intravenous Daily   Continuous Infusions: . sodium chloride    . labetalol (NORMODYNE) infusion 5 mg/mL Stopped (11/13/20 0834)     LOS: 6 days    Time spent:40 min    Reford Olliff, Geraldo Docker, MD [facility] Pager [phone]  If 7PM-7AM, please contact night-coverage [email or web address] Password [facility] 11/15/2020, 8:28 AM

## 2020-11-16 ENCOUNTER — Other Ambulatory Visit: Payer: Self-pay

## 2020-11-16 DIAGNOSIS — I71012 Dissection of descending thoracic aorta: Secondary | ICD-10-CM

## 2020-11-16 DIAGNOSIS — I7101 Dissection of thoracic aorta: Secondary | ICD-10-CM

## 2020-11-16 LAB — CBC
HCT: 31.4 % — ABNORMAL LOW (ref 36.0–46.0)
Hemoglobin: 9.4 g/dL — ABNORMAL LOW (ref 12.0–15.0)
MCH: 23.6 pg — ABNORMAL LOW (ref 26.0–34.0)
MCHC: 29.9 g/dL — ABNORMAL LOW (ref 30.0–36.0)
MCV: 78.9 fL — ABNORMAL LOW (ref 80.0–100.0)
Platelets: 313 10*3/uL (ref 150–400)
RBC: 3.98 MIL/uL (ref 3.87–5.11)
RDW: 19.8 % — ABNORMAL HIGH (ref 11.5–15.5)
WBC: 7.5 10*3/uL (ref 4.0–10.5)
nRBC: 0 % (ref 0.0–0.2)

## 2020-11-16 LAB — COMPREHENSIVE METABOLIC PANEL
ALT: 16 U/L (ref 0–44)
AST: 21 U/L (ref 15–41)
Albumin: 3.3 g/dL — ABNORMAL LOW (ref 3.5–5.0)
Alkaline Phosphatase: 47 U/L (ref 38–126)
Anion gap: 13 (ref 5–15)
BUN: 10 mg/dL (ref 6–20)
CO2: 22 mmol/L (ref 22–32)
Calcium: 9.7 mg/dL (ref 8.9–10.3)
Chloride: 101 mmol/L (ref 98–111)
Creatinine, Ser: 0.82 mg/dL (ref 0.44–1.00)
GFR, Estimated: >60 mL/min (ref >60)
Glucose, Bld: 98 mg/dL (ref 70–99)
Potassium: 3.6 mmol/L (ref 3.5–5.1)
Sodium: 136 mmol/L (ref 135–145)
Total Bilirubin: 1 mg/dL (ref 0.3–1.2)
Total Protein: 7.7 g/dL (ref 6.5–8.1)

## 2020-11-16 LAB — PHOSPHORUS: Phosphorus: 4.4 mg/dL (ref 2.5–4.6)

## 2020-11-16 LAB — MAGNESIUM: Magnesium: 1.8 mg/dL (ref 1.7–2.4)

## 2020-11-16 MED ORDER — MAGNESIUM SULFATE 2 GM/50ML IV SOLN: 2.0000 g | Freq: Once | INTRAVENOUS | Status: DC

## 2020-11-16 MED ORDER — HYDRALAZINE HCL 50 MG PO TABS: 75.0000 mg | ORAL_TABLET | Freq: Four times a day (QID) | ORAL | Status: DC

## 2020-11-16 MED ORDER — HYDROCHLOROTHIAZIDE 25 MG PO TABS
50.0000 mg | ORAL_TABLET | Freq: Every day | ORAL | Status: DC
  Administered 2020-11-16 – 2020-11-18 (×3): 50 mg via ORAL
  Filled 2020-11-16 (×3): qty 2

## 2020-11-16 MED ORDER — POTASSIUM CHLORIDE CRYS ER 20 MEQ PO TBCR
40.0000 meq | EXTENDED_RELEASE_TABLET | Freq: Once | ORAL | Status: AC
  Administered 2020-11-16: 40 meq via ORAL
  Filled 2020-11-16: qty 2

## 2020-11-16 MED ORDER — HYDROCHLOROTHIAZIDE 50 MG PO TABS
50.0000 mg | ORAL_TABLET | Freq: Every day | ORAL | Status: DC
  Filled 2020-11-16: qty 1

## 2020-11-16 MED ORDER — MAGNESIUM OXIDE 400 (241.3 MG) MG PO TABS
400.0000 mg | ORAL_TABLET | Freq: Four times a day (QID) | ORAL | Status: AC
  Administered 2020-11-16 – 2020-11-17 (×4): 400 mg via ORAL
  Filled 2020-11-16 (×4): qty 1

## 2020-11-16 MED ORDER — HYDRALAZINE HCL 50 MG PO TABS
75.0000 mg | ORAL_TABLET | Freq: Three times a day (TID) | ORAL | Status: DC
  Administered 2020-11-16 – 2020-11-18 (×6): 75 mg via ORAL
  Filled 2020-11-16 (×7): qty 1

## 2020-11-16 MED ORDER — HYDROCHLOROTHIAZIDE 50 MG PO TABS: 50.0000 mg | ORAL_TABLET | Freq: Every day | ORAL | Status: DC

## 2020-11-16 NOTE — Plan of Care (Addendum)
Nutrition Education Note  RD consulted for nutrition education regarding heart healthy diet for severe aortic dissection.  RD provided "Low Sodium Nutrition Therapy" handout from the Academy of Nutrition and Dietetics in discharge instructions. Patient refused to provide dietary recall. Patient stated she often eats fast food and bacon. Patient admitted she knew these foods contain high amounts of sodium. Patient stated she is aware of what foods contain salt and requested to have a handout. Patient stated she has family support from her brother to help reduce sodium intake.   RD discussed why it is important for patient to adhere to diet recommendations for improvement of health and decreased hospital admissions. Teach back method used.  Expect noncompliance.   Current diet order is heart healthy, patient is consuming approximately 100% of meals at this time. Labs and medications reviewed. No further nutrition interventions warranted at this time. RD contact information provided. If additional nutrition issues arise, please re-consult RD.   Ronnald Nian, Dietetic Intern Pager: [phone] If unavailable: [phone]

## 2020-11-16 NOTE — Progress Notes (Signed)
PROGRESS NOTE    Levy Wellman  WIO:[id number] DOB: 1986/05/28 DOA: 11/09/2020 PCP: Trey Sailors, PA     Brief Narrative:  Robin Guerrero is a 34 y.o. BF PMHx Depression, HTN, Aortic dissection, Prolonged QT interval, EtOH abuse, Tobacco abuse, EtOH pancreatitis, She was admitted 11/25 with large type B aortic dissection involving the entire abdominal aorta and possibly through to the origin of the right common femoral artery.  She initially presented to [facility] ED 11/23 with N/V, abd pain.  She was hypertensive and it was felt this was due to her missing antihypertensive meds due to vomiting.  She was discharged with bentyl, zofran and instructed to follow up with PCP.  11/24 and 11/25 she had ongoing pain which worsened 11/25 and radiated lower into her back prompting her to return to [facility] ED.  She had CT abdomen which revealed a descending thoracic aortic dissection.  Case was discussed with Dr. Luan Pulling of vascular surgery who recommended transfer to [facility] and tight BP control for the night.   Subjective: 12/2/O x4, negative CP, negative back pain, negative N/V.   Assessment & Plan: Covid vaccination; negative vaccination   Active Problems:   Descending thoracic dissection (HCC)  Type B aortic dissection  -Most likely likely secondary to uncontrolled malignant HTN  -12/1 BP still uncontrolled.  SBP goal<120 -Amlodipine 10 mg daily -Coreg 25 mg BID -12/1 clonidine 0.1 mg TID -12/2 increase Hydralazine75 mg QID -HCTZ 50 mg daily -Losartan 100 mg daily -Prazosin 2 mg daily -12/1 consult nutrition; education on heart healthy diet (Na<2 g/day)  Malignant HTN -See aortic dissection  Agitation -Xanax 0.5 mg PRN -Seroquel 25 mg BID  Hx depression, EtOH use, tobacco use -continue home gabapentin, thiamine, folate, nicotine patch  Hypokalemia -Potassium goal> 4 -K-Dur 40 mEq  Hypomagnesemia -Magnesium goal> 2 -Magnesium IV 2 g   Microcytic anemia - H/H  stable, transfuse for Hgb < 7 - Trend CBC - continue ferrous sulfate  Anion gap metabolic acidosis -42/6 calculated AG= 17.1  Unvaccinated Covid patient -After counseling patient on the risks associated with Covid and her current diagnosis patient agreed to accept Covid vaccination prior to discharge.  Goals of care -12/2 Palliative Care Consult; patient is noncompliant, with medication and diet.  Has been counseled extensively on the risks of noncompliance.  Change patient status to DNR, discuss appointment HCPOA, and advanced directives.    DVT prophylaxis: SCD Code Status: Full Family Communication: 12/1 family at bedside for explanation of plan of care all questions answered Status is: Inpatient    Dispo: The patient is from: Home              Anticipated d/c is to: Home              Anticipated d/c date is: 12/4              Patient currently unstable      Consultants:  Vascular surgery PCCM  Procedures/Significant Events:  11/25 Admit  11/26 Patient with ongoing mild back pain and headache 11/27 Remains on Esmolol gtt, BP/HR controlled  11/28 off cardizem and esmolol gtt 11/29 precedex off in am     CT A / P 11/25 > descending thoracic aorta dissection, trace left pleural effusion, trace haziness surrounding the uncinate process of pancreas. CTA chest 11/25 > type B thoracic aortic dissection involving the entire abdominal aorta and possibly through to the origin of the right common femoral artery, possible mild acute pancreatitis.  I have  personally reviewed and interpreted all radiology studies and my findings are as above.  VENTILATOR SETTINGS:    Cultures 11/25 flu > neg. 11/25 COVID > neg.  Antimicrobials:    Devices    LINES / TUBES:  Art line 11/25 > out    Continuous Infusions: . sodium chloride    . labetalol (NORMODYNE) infusion 5 mg/mL Stopped (11/13/20 6270)     Objective: Vitals:   11/15/20 2028 11/16/20 0057 11/16/20  0500 11/16/20 0833  BP: 124/78 133/75 128/74 135/72  Pulse: 96 90 89 97  Resp: 19 19 16 18   Temp: 98.8 F (37.1 C) 98.2 F (36.8 C) 98.8 F (37.1 C) 98.3 F (36.8 C)  TempSrc: Oral Oral Oral Oral  SpO2: 98% 98% 99% 98%  Weight:   104.5 kg   Height:        Intake/Output Summary (Last 24 hours) at 11/16/2020 3500 Last data filed at 11/16/2020 0300 Gross per 24 hour  Intake 400 ml  Output --  Net 400 ml   Filed Weights   11/14/20 0500 11/15/20 0647 11/16/20 0500  Weight: 111.2 kg 105.6 kg 104.5 kg    Examination:  General: A/O x4, No acute respiratory distress Eyes: negative scleral hemorrhage, negative anisocoria, negative icterus ENT: Negative Runny nose, negative gingival bleeding, Neck:  Negative scars, masses, torticollis, lymphadenopathy, JVD Lungs: Clear to auscultation bilaterally without wheezes or crackles Cardiovascular: Regular rate and rhythm without murmur gallop or rub normal S1 and S2 Abdomen: OBESE, negative abdominal pain, nondistended, positive soft, bowel sounds, no rebound, no ascites, no appreciable mass Extremities: No significant cyanosis, clubbing, or edema bilateral lower extremities Skin: Negative rashes, lesions, ulcers Psychiatric:  Negative depression, negative anxiety, negative fatigue, negative mania  Central nervous system:  Cranial nerves II through XII intact, tongue/uvula midline, all extremities muscle strength 5/5, sensation intact throughout, negative dysarthria, negative expressive aphasia, negative receptive aphasia.  .     Data Reviewed: Care during the described time interval was provided by me .  I have reviewed this patient's available data, including medical history, events of note, physical examination, and all test results as part of my evaluation.  CBC: Recent Labs  Lab 11/09/20 1733 11/10/20 0645 11/12/20 0110 11/13/20 0811 11/14/20 0131 11/15/20 0438 11/16/20 0152  WBC 8.5   < > 8.4 6.5 8.6 8.0 7.5  NEUTROABS 6.9   --   --   --   --   --   --   HGB 9.5*   < > 8.5* 8.9* 8.3* 9.1* 9.4*  HCT 31.9*   < > 28.3* 29.4* 27.8* 28.5* 31.4*  MCV 80.2   < > 79.3* 77.8* 78.1* 76.8* 78.9*  PLT 191   < > 202 PLATELET CLUMPS NOTED ON SMEAR, UNABLE TO ESTIMATE 231 283 313   < > = values in this interval not displayed.   Basic Metabolic Panel: Recent Labs  Lab 11/10/20 0645 11/10/20 0645 11/11/20 0430 11/11/20 0430 11/12/20 0110 11/13/20 9381 11/14/20 0131 11/15/20 0438 11/15/20 0943 11/16/20 0152  NA 132*   < > 133*   < > 132* 134* 137 136  --  136  K 3.5   < > 3.6   < > 4.2 3.6 3.2* 4.1  --  3.6  CL 102   < > 103   < > 104 104 105 101  --  101  CO2 20*   < > 19*   < > 17* 18* 19* 22  --  22  GLUCOSE  96   < > 109*   < > 94 119* 92 97  --  98  BUN 8   < > 8   < > 8 9 6 7   --  10  CREATININE 0.78   < > 0.81   < > 0.78 0.99 0.96 0.83  --  0.82  CALCIUM 8.8*   < > 8.8*   < > 9.0 9.3 9.1 9.5  --  9.7  MG 1.8   < > 1.9  --  1.9 1.6*  --   --  1.8 1.8  PHOS 3.5  --   --   --   --   --   --   --   --  4.4   < > = values in this interval not displayed.   GFR: Estimated Creatinine Clearance: 124.4 mL/min (by C-G formula based on SCr of 0.82 mg/dL). Liver Function Tests: Recent Labs  Lab 11/09/20 1733 11/12/20 0110 11/13/20 0811 11/16/20 0152  AST 15 14* 23 21  ALT 14 12 16 16   ALKPHOS 48 41 44 47  BILITOT 2.1* 0.7 1.0 1.0  PROT 7.8 6.7 6.8 7.7  ALBUMIN 3.7 2.9* 3.0* 3.3*   Recent Labs  Lab 11/09/20 1733  LIPASE 125*   No results for input(s): AMMONIA in the last 168 hours. Coagulation Profile: No results for input(s): INR, PROTIME in the last 168 hours. Cardiac Enzymes: No results for input(s): CKTOTAL, CKMB, CKMBINDEX, TROPONINI in the last 168 hours. BNP (last 3 results) No results for input(s): PROBNP in the last 8760 hours. HbA1C: No results for input(s): HGBA1C in the last 72 hours. CBG: No results for input(s): GLUCAP in the last 168 hours. Lipid Profile: No results for input(s):  CHOL, HDL, LDLCALC, TRIG, CHOLHDL, LDLDIRECT in the last 72 hours. Thyroid Function Tests: No results for input(s): TSH, T4TOTAL, FREET4, T3FREE, THYROIDAB in the last 72 hours. Anemia Panel: No results for input(s): VITAMINB12, FOLATE, FERRITIN, TIBC, IRON, RETICCTPCT in the last 72 hours. Sepsis Labs: No results for input(s): PROCALCITON, LATICACIDVEN in the last 168 hours.  Recent Results (from the past 240 hour(s))  Resp Panel by RT-PCR (Flu A&B, Covid) Nasopharyngeal Swab     Status: None   Collection Time: 11/09/20  7:59 PM   Specimen: Nasopharyngeal Swab; Nasopharyngeal(NP) swabs in vial transport medium  Result Value Ref Range Status   SARS Coronavirus 2 by RT PCR NEGATIVE NEGATIVE Final    Comment: (NOTE) SARS-CoV-2 target nucleic acids are NOT DETECTED.  The SARS-CoV-2 RNA is generally detectable in upper respiratory specimens during the acute phase of infection. The lowest concentration of SARS-CoV-2 viral copies this assay can detect is 138 copies/mL. A negative result does not preclude SARS-Cov-2 infection and should not be used as the sole basis for treatment or other patient management decisions. A negative result may occur with  improper specimen collection/handling, submission of specimen other than nasopharyngeal swab, presence of viral mutation(s) within the areas targeted by this assay, and inadequate number of viral copies(<138 copies/mL). A negative result must be combined with clinical observations, patient history, and epidemiological information. The expected result is Negative.  Fact Sheet for Patients:  EntrepreneurPulse.com.au  Fact Sheet for Healthcare Providers:  IncredibleEmployment.be  This test is no t yet approved or cleared by the [place] FDA and  has been authorized for detection and/or diagnosis of SARS-CoV-2 by FDA under an Emergency Use Authorization (EUA). This EUA will remain  in effect (meaning  this test can  be used) for the duration of the COVID-19 declaration under Section 564(b)(1) of the Act, 21 U.S.C.section 360bbb-3(b)(1), unless the authorization is terminated  or revoked sooner.       Influenza A by PCR NEGATIVE NEGATIVE Final   Influenza B by PCR NEGATIVE NEGATIVE Final    Comment: (NOTE) The Xpert Xpress SARS-CoV-2/FLU/RSV plus assay is intended as an aid in the diagnosis of influenza from Nasopharyngeal swab specimens and should not be used as a sole basis for treatment. Nasal washings and aspirates are unacceptable for Xpert Xpress SARS-CoV-2/FLU/RSV testing.  Fact Sheet for Patients: EntrepreneurPulse.com.au  Fact Sheet for Healthcare Providers: IncredibleEmployment.be  This test is not yet approved or cleared by the [place] FDA and has been authorized for detection and/or diagnosis of SARS-CoV-2 by FDA under an Emergency Use Authorization (EUA). This EUA will remain in effect (meaning this test can be used) for the duration of the COVID-19 declaration under Section 564(b)(1) of the Act, 21 U.S.C. section 360bbb-3(b)(1), unless the authorization is terminated or revoked.  Performed at [facility], [place] [address] [place], [place] [address]   MRSA PCR Screening     Status: None   Collection Time: 11/09/20 11:07 PM   Specimen: Nasopharyngeal  Result Value Ref Range Status   MRSA by PCR NEGATIVE NEGATIVE Final    Comment:        The GeneXpert MRSA Assay (FDA approved for NASAL specimens only), is one component of a comprehensive MRSA colonization surveillance program. It is not intended to diagnose MRSA infection nor to guide or monitor treatment for MRSA infections. Performed at [facility], [place] [address] [place], [place] [address]          Radiology Studies: No results found.      Scheduled Meds: . amLODipine  10 mg Oral Daily  . carvedilol  25 mg Oral BID WC   . Chlorhexidine Gluconate Cloth  6 each Topical Daily  . cloNIDine  0.1 mg Oral TID  . dicyclomine  20 mg Oral BID  . ferrous sulfate  325 mg Oral Daily  . folic acid  1 mg Oral Daily  . hydrALAZINE  50 mg Oral Q8H  . hydrochlorothiazide  50 mg Oral Daily  . losartan  100 mg Oral Daily  . multivitamin with minerals  1 tablet Oral Daily  . nicotine  14 mg Transdermal Daily  . prazosin  2 mg Oral QHS  . QUEtiapine  25 mg Oral BID  . thiamine  100 mg Oral Daily   Or  . thiamine  100 mg Intravenous Daily   Continuous Infusions: . sodium chloride    . labetalol (NORMODYNE) infusion 5 mg/mL Stopped (11/13/20 0834)     LOS: 7 days    Time spent:40 min    Remmy Riffe, Geraldo Docker, MD [facility] Pager [phone]  If 7PM-7AM, please contact night-coverage [email or web address] Password [facility] 11/16/2020, 9:17 AM

## 2020-11-16 NOTE — Discharge Instructions (Signed)

## 2020-11-16 NOTE — Social Work (Addendum)
CSW consulted for Substance abuse counseling/education. CSW visit with patient at bedside. CSW introduced self and explained reason for consult. Patient was reluctant to discuss much about her situation but agreeable to accept resources for ETOH/SA Abuse and Mental Health. Patient states no questions or concerns at this time.   Thurmond Butts, MSW, [place] Clinical Social Worker

## 2020-11-16 NOTE — Progress Notes (Addendum)
[facility]   Subjective  - No new complaints, but she continues to have on/off thoracic pain that feels like a pulled muscle.   Objective 128/74 89 98.8 F (37.1 C) (Oral) 16 99%  Intake/Output Summary (Last 24 hours) at 11/16/2020 0753 Last data filed at 11/16/2020 0300 Gross per 24 hour  Intake 400 ml  Output --  Net 400 ml    B LE warm to touch, palpable pedal pulses  Abdomin sort NTTP Moving UE without increased thoracic pain Lungs non labored breathing    Assessment/Planning: 34 y.o.femaleWith type B aortic dissection  BP better controlled with addition of Clonidine No significant changes with thoracic pain.  Will defer f/u CTA to Dr. Stanford Breed. Patient refusing IV access  Robin Guerrero 11/16/2020 7:53 AM --  Laboratory Lab Results: Recent Labs    11/15/20 0438 11/16/20 0152  WBC 8.0 7.5  HGB 9.1* 9.4*  HCT 28.5* 31.4*  PLT 283 313   BMET Recent Labs    11/15/20 0438 11/16/20 0152  NA 136 136  K 4.1 3.6  CL 101 101  CO2 22 22  GLUCOSE 97 98  BUN 7 10  CREATININE 0.83 0.82  CALCIUM 9.5 9.7    COAG Lab Results  Component Value Date   INR 1.1 08/27/2020   No results found for: PTT  VASCULAR STAFF ADDENDUM: I have independently interviewed and examined the patient. I agree with the above.  HR/BP control better today. OK to discharge if BP remains under reasonable control with oral regimen. Continues to refuse IV access which has slowed her progress. Follow up with me in 1 months with CTA Chest/Abdomen/Pelvis.  Robin Guerrero. Stanford Breed, MD [facility] Phone Number: [phone] 11/16/2020 10:04 AM

## 2020-11-16 NOTE — Plan of Care (Signed)

## 2020-11-17 ENCOUNTER — Other Ambulatory Visit (HOSPITAL_COMMUNITY): Payer: Self-pay | Admitting: Internal Medicine

## 2020-11-17 DIAGNOSIS — Z515 Encounter for palliative care: Secondary | ICD-10-CM

## 2020-11-17 DIAGNOSIS — Z7189 Other specified counseling: Secondary | ICD-10-CM

## 2020-11-17 LAB — COMPREHENSIVE METABOLIC PANEL
ALT: 17 U/L (ref 0–44)
AST: 20 U/L (ref 15–41)
Albumin: 3.3 g/dL — ABNORMAL LOW (ref 3.5–5.0)
Alkaline Phosphatase: 43 U/L (ref 38–126)
Anion gap: 13 (ref 5–15)
BUN: 15 mg/dL (ref 6–20)
CO2: 24 mmol/L (ref 22–32)
Calcium: 9.9 mg/dL (ref 8.9–10.3)
Chloride: 101 mmol/L (ref 98–111)
Creatinine, Ser: 0.88 mg/dL (ref 0.44–1.00)
GFR, Estimated: >60 mL/min (ref >60)
Glucose, Bld: 100 mg/dL — ABNORMAL HIGH (ref 70–99)
Potassium: 3.8 mmol/L (ref 3.5–5.1)
Sodium: 138 mmol/L (ref 135–145)
Total Bilirubin: 0.9 mg/dL (ref 0.3–1.2)
Total Protein: 7.7 g/dL (ref 6.5–8.1)

## 2020-11-17 LAB — CBC
HCT: 30.2 % — ABNORMAL LOW (ref 36.0–46.0)
Hemoglobin: 9 g/dL — ABNORMAL LOW (ref 12.0–15.0)
MCH: 23.4 pg — ABNORMAL LOW (ref 26.0–34.0)
MCHC: 29.8 g/dL — ABNORMAL LOW (ref 30.0–36.0)
MCV: 78.6 fL — ABNORMAL LOW (ref 80.0–100.0)
Platelets: 338 10*3/uL (ref 150–400)
RBC: 3.84 MIL/uL — ABNORMAL LOW (ref 3.87–5.11)
RDW: 19.7 % — ABNORMAL HIGH (ref 11.5–15.5)
WBC: 8.1 10*3/uL (ref 4.0–10.5)
nRBC: 0 % (ref 0.0–0.2)

## 2020-11-17 LAB — MAGNESIUM: Magnesium: 2 mg/dL (ref 1.7–2.4)

## 2020-11-17 LAB — PHOSPHORUS: Phosphorus: 4.6 mg/dL (ref 2.5–4.6)

## 2020-11-17 MED ORDER — HYDRALAZINE HCL 50 MG PO TABS
75.0000 mg | ORAL_TABLET | Freq: Three times a day (TID) | ORAL | 0 refills | Status: DC
Start: 2020-11-17 — End: 2020-11-17

## 2020-11-17 MED ORDER — LOSARTAN POTASSIUM 100 MG PO TABS
100.0000 mg | ORAL_TABLET | Freq: Every day | ORAL | 0 refills | Status: DC
Start: 2020-11-17 — End: 2020-12-18

## 2020-11-17 MED ORDER — THIAMINE HCL 100 MG PO TABS
100.0000 mg | ORAL_TABLET | Freq: Every day | ORAL | 0 refills | Status: DC
Start: 2020-11-18 — End: 2020-11-18

## 2020-11-17 MED ORDER — AMLODIPINE BESYLATE 10 MG PO TABS
10.0000 mg | ORAL_TABLET | Freq: Every day | ORAL | 0 refills | Status: DC
Start: 2020-11-18 — End: 2020-12-18

## 2020-11-17 MED ORDER — HYDROCHLOROTHIAZIDE 50 MG PO TABS
50.0000 mg | ORAL_TABLET | Freq: Every day | ORAL | 0 refills | Status: DC
Start: 2020-11-18 — End: 2020-12-18

## 2020-11-17 MED ORDER — ADULT MULTIVITAMIN W/MINERALS CH
1.0000 | ORAL_TABLET | Freq: Every day | ORAL | 0 refills | Status: DC
Start: 2020-11-18 — End: 2022-06-08

## 2020-11-17 MED ORDER — QUETIAPINE FUMARATE 25 MG PO TABS
25.0000 mg | ORAL_TABLET | Freq: Two times a day (BID) | ORAL | 0 refills | Status: DC
Start: 2020-11-17 — End: 2020-11-17

## 2020-11-17 MED ORDER — PRAZOSIN HCL 2 MG PO CAPS
2.0000 mg | ORAL_CAPSULE | Freq: Every day | ORAL | 0 refills | Status: DC
Start: 2020-11-17 — End: 2020-12-18

## 2020-11-17 MED ORDER — CARVEDILOL 25 MG PO TABS
25.0000 mg | ORAL_TABLET | Freq: Two times a day (BID) | ORAL | 0 refills | Status: DC
Start: 2020-11-17 — End: 2020-12-18

## 2020-11-17 MED ORDER — CLONIDINE HCL 0.1 MG PO TABS
0.1000 mg | ORAL_TABLET | Freq: Three times a day (TID) | ORAL | 0 refills | Status: DC
Start: 2020-11-17 — End: 2020-12-18

## 2020-11-17 MED FILL — VITAMIN B-1 100 MG TABS: 100 | 30 days supply | Qty: 30 | Fill #0

## 2020-11-17 MED FILL — CARVEDILOL 25 MG TABLET: 25 | 30 days supply | Qty: 60 | Fill #0

## 2020-11-17 MED FILL — CERTAVITE/ANTIOXIDANTS TABS: 30 days supply | Qty: 30 | Fill #0

## 2020-11-17 MED FILL — HYDROCHLOROTHIAZIDE 50 MG T: 50 | 30 days supply | Qty: 30 | Fill #0

## 2020-11-17 MED FILL — AMLODIPINE BESYLATE 10 MG T: 10 | 30 days supply | Qty: 30 | Fill #0

## 2020-11-17 MED FILL — hydrALAZINE HCL 50 MG TABS: 50 | 30 days supply | Qty: 120 | Fill #0

## 2020-11-17 MED FILL — cloNIDine HCL 0.1 MG TABS: 0.1 | 30 days supply | Qty: 90 | Fill #0

## 2020-11-17 MED FILL — QUETIAPINE FUMARATE 25 MG T: 25 | 30 days supply | Qty: 60 | Fill #0

## 2020-11-17 MED FILL — PRAZOSIN HCL 2 MG CAPS: 2 | 30 days supply | Qty: 30 | Fill #0

## 2020-11-17 MED FILL — LOSARTAN POTASSIUM 100 MG T: 100 | 30 days supply | Qty: 30 | Fill #0

## 2020-11-17 NOTE — TOC Initial Note (Signed)
Transition of Care [facility] - Initial/Assessment Note    Patient Details  Name: Robin Guerrero MRN: [id number] Date of Birth: Aug 02, 1986  Transition of Care [facility]) CM/SW Contact:    Vinie Sill, [place] Phone Number: 11/17/2020, 4:44 PM  Clinical Narrative:                  CSW visit with patient at bedside. CSW explained to patient her sister in law, Kennyth Lose has requested CSW to call her. Patient gave CSW permission to call.  CSW spoke with Kennyth Lose regarding concerns if something was to happen to patient how to proceed with getting custody of kids. CSW advised and encourage the family to start the  conversation with the patient now and ask how she would want things. CSW explained Marny Lowenstein is granted by the Courts and when children are involved it can be a lengthy and difficult process,especially, if no record of who will care for the kids if something was to happen(dealth of a parent,etc). Family states they understand.   CSW went back to talk with the patient regarding the phone call. CSW explained her brother and sister in law was concerned about her and her family. Patient shared she was really not that close to her brother. He is 45-70 years older, but he is all the family she has so she tries to maintain the relationship. She was a little skeptical of their concerns and believes when she really needed help they were not there. Patient was tearful but begin to open up and talk more about the loss or mother, difficulty of handling situations with out her mother, how much her kids miss their grandmother,feelings of being over whelmed, quotes from her mother, she even mustard up a few laughs and expressed her wiliness not to give up because of her kids. CSW expressed appreciation of patient sharing some of her feelings and again encouraged the patient to see a Mental Health counselor and consider getting counseling for her kids as well. Patient was receptive of advise given.   Thurmond Butts,  MSW, [place] Clinical Social Worker   Expected Discharge Plan: Home w Home Health Services Barriers to Discharge: Continued Medical Work up   Patient Goals and CMS Choice        Expected Discharge Plan and Services Expected Discharge Plan: [place] In-house Referral: Clinical Social Work                                            Prior Living Arrangements/Services   Lives with:: Self, Minor Children          Need for Family Participation in Patient Care: No (Comment) Care giver support system in place?: No (comment)   Criminal Activity/Legal Involvement Pertinent to Current Situation/Hospitalization: No - Comment as needed  Activities of Daily Living Home Assistive Devices/Equipment: None ADL Screening (condition at time of admission) Patient's cognitive ability adequate to safely complete daily activities?: Yes Is the patient deaf or have difficulty hearing?: No Does the patient have difficulty seeing, even when wearing glasses/contacts?: No Does the patient have difficulty concentrating, remembering, or making decisions?: No Patient able to express need for assistance with ADLs?: No Does the patient have difficulty dressing or bathing?: No Independently performs ADLs?: Yes (appropriate for developmental age) Does the patient have difficulty walking or climbing stairs?: No Weakness of Legs: None Weakness of  Arms/Hands: None  Permission Sought/Granted Permission sought to share information with : Family Supports    Share Information with NAME: Brenton Grills and [place] granted to share info w Relationship: brother and sisiter in Sports coach     Emotional Assessment Appearance:: Appears older than stated age Attitude/Demeanor/Rapport: Engaged Affect (typically observed): Appropriate, Grieving, Pleasant, Overwhelmed Orientation: : Oriented to Self, Oriented to Place, Oriented to  Time, Oriented to Situation Alcohol / Substance  Use: Alcohol Use, Tobacco Use Psych Involvement: No (comment)  Admission diagnosis:  Aortic dissection ([place]) [I71.00] Descending thoracic dissection ([place]) [I71.01] Patient Active Problem List   Diagnosis Date Noted  . Descending thoracic dissection ([place]) 11/09/2020  . Hypertensive emergency   . Nausea   . Back pain   . Hypocalcemia 08/19/2020  . Acute alcoholic pancreatitis 78/58/8502  . Thrombocytopenia ([place]) 08/19/2020  . Normocytic anemia 08/19/2020  . Alcoholic pancreatitis 77/41/2878  . SIRS (systemic inflammatory response syndrome) ([place]) 08/18/2020  . Hypokalemia 08/18/2020  . Hypomagnesemia 08/18/2020  . Prolonged QT interval 08/18/2020  . Essential hypertension 08/18/2020  . Depression 08/18/2020  . Tobacco use 08/18/2020   PCP:  Trey Sailors, PA Pharmacy:   [id number] - [place], [place] [address] [place] [address] Phone: [phone] Fax: [phone]  [facility] [place] [place] - [address] [place] [address] Phone: [phone] Fax: [phone]     Social Determinants of Health (SDOH) Interventions    Readmission Risk Interventions No flowsheet data found.

## 2020-11-17 NOTE — Progress Notes (Addendum)
Progress Note    11/17/2020 7:01 AM * No surgery found *  Subjective: Continues to report intermittent upper mid back radiating to anterior chest pain that feels like a muscular strain.  She is otherwise doing well tolerating diet and ambulating in her room.   Vitals:   11/17/20 0421 11/17/20 0551  BP: 118/75 133/78  Pulse: 90   Resp: 20   Temp: 98.8 F (37.1 C)   SpO2: 96%    sys BP averaging around 120s-130s over last 24 hours HR 90s Physical Exam: Cardiac: Rate and rhythm are regular Lungs: There to auscultation bilaterally Extremities: There is all well.  2+ radial and dorsalis pedis pulses bilaterally Abdomen: Soft, nondistended  CBC    Component Value Date/Time   WBC 8.1 11/17/2020 0115   RBC 3.84 (L) 11/17/2020 0115   HGB 9.0 (L) 11/17/2020 0115   HCT 30.2 (L) 11/17/2020 0115   PLT 338 11/17/2020 0115   MCV 78.6 (L) 11/17/2020 0115   MCH 23.4 (L) 11/17/2020 0115   MCHC 29.8 (L) 11/17/2020 0115   RDW 19.7 (H) 11/17/2020 0115   LYMPHSABS 0.9 11/09/2020 1733   MONOABS 0.7 11/09/2020 1733   EOSABS 0.0 11/09/2020 1733   BASOSABS 0.0 11/09/2020 1733    BMET    Component Value Date/Time   NA 138 11/17/2020 0115   K 3.8 11/17/2020 0115   CL 101 11/17/2020 0115   CO2 24 11/17/2020 0115   GLUCOSE 100 (H) 11/17/2020 0115   BUN 15 11/17/2020 0115   CREATININE 0.88 11/17/2020 0115   CALCIUM 9.9 11/17/2020 0115   GFRNONAA >60 11/17/2020 0115   GFRAA >60 08/27/2020 0351     Intake/Output Summary (Last 24 hours) at 11/17/2020 0701 Last data filed at 11/16/2020 2000 Gross per 24 hour  Intake 720 ml  Output --  Net 720 ml    HOSPITAL MEDICATIONS Scheduled Meds: . amLODipine  10 mg Oral Daily  . carvedilol  25 mg Oral BID WC  . Chlorhexidine Gluconate Cloth  6 each Topical Daily  . cloNIDine  0.1 mg Oral TID  . dicyclomine  20 mg Oral BID  . ferrous sulfate  325 mg Oral Daily  . folic acid  1 mg Oral Daily  . hydrALAZINE  75 mg Oral Q8H  .  hydrochlorothiazide  50 mg Oral Daily  . losartan  100 mg Oral Daily  . magnesium oxide  400 mg Oral QID  . multivitamin with minerals  1 tablet Oral Daily  . nicotine  14 mg Transdermal Daily  . prazosin  2 mg Oral QHS  . QUEtiapine  25 mg Oral BID  . thiamine  100 mg Oral Daily   Or  . thiamine  100 mg Intravenous Daily   Continuous Infusions: . sodium chloride     PRN Meds:.[CANCELED] Place/Maintain arterial line **AND** sodium chloride, acetaminophen, ALPRAZolam, docusate sodium, fentaNYL (SUBLIMAZE) injection, hydrALAZINE, ondansetron (ZOFRAN) IV, polyethylene glycol  Assessment: 34 y.o.femaleWith type B aortic dissection.  Blood pressure and heart rate stable and controlled on oral agents. Her pain appears to be minimal. Inpatient day #9   Plan: -Outpatient follow-up in one month with CTA of chest, abd and pelvis.  -DVT prophylaxis:  SCDs   Risa Grill, PA-C [facility] [phone] 11/17/2020  7:01 AM   VASCULAR STAFF ADDENDUM: I have independently interviewed and examined the patient. I agree with the above.  Good control of HR/BP on current oral regimen. Safe for discharge from my perspective. Follow  up with me in one month with CTA chest / abdomen / pelvis.  [name]. Stanford Breed, MD [facility] Phone Number: [phone] 11/17/2020 11:23 AM

## 2020-11-17 NOTE — Progress Notes (Signed)
PROGRESS NOTE    Robin Guerrero  ZOX:[id number] DOB: 06/18/1986 DOA: 11/09/2020 PCP: Trey Sailors, PA     Brief Narrative:  Robin Guerrero is a 34 y.o. BF PMHx Depression, HTN, Aortic dissection, Prolonged QT interval, EtOH abuse, Tobacco abuse, EtOH pancreatitis, She was admitted 11/25 with large type B aortic dissection involving the entire abdominal aorta and possibly through to the origin of the right common femoral artery.  She initially presented to [facility] ED 11/23 with N/V, abd pain.  She was hypertensive and it was felt this was due to her missing antihypertensive meds due to vomiting.  She was discharged with bentyl, zofran and instructed to follow up with PCP.  11/24 and 11/25 she had ongoing pain which worsened 11/25 and radiated lower into her back prompting her to return to [facility] ED.  She had CT abdomen which revealed a descending thoracic aortic dissection.  Case was discussed with Dr. Luan Pulling of vascular surgery who recommended transfer to [facility] and tight BP control for the night.   Subjective: 12/3 afebrile overnight.  BP much better controlled.   Assessment & Plan: Covid vaccination; negative vaccination   Active Problems:   Descending thoracic dissection (HCC)  Type B aortic dissection  -Most likely likely secondary to uncontrolled malignant HTN  -12/1 BP still uncontrolled.  SBP goal<120 -Amlodipine 10 mg daily -Coreg 25 mg BID -12/1 clonidine 0.1 mg TID -12/2 increase Hydralazine75 mg QID -HCTZ 50 mg daily -Losartan 100 mg daily -Prazosin 2 mg daily -12/1 consult nutrition; education on heart healthy diet (Na<2 g/day) -12/3 PT/OT consult work with patient aggressively monitor BP at all times.  SBP goal<120 ensure that you chart BP during workout -Follow-up with Dr. Jamelle Haring vascular surgery in 4 weeks.  Office will call to set up your appointment.  [phone]  Malignant HTN -See aortic dissection  Agitation -Xanax 0.5 mg PRN -Seroquel 25 mg  BID  Hx depression, EtOH use, tobacco use -continue home gabapentin, thiamine, folate, nicotine patch  Hypokalemia -Potassium goal> 4 -K-Dur 40 mEq  Hypomagnesemia -Magnesium goal> 2 -Magnesium IV 2 g   Microcytic anemia - H/H stable, transfuse for Hgb < 7 - Trend CBC - continue ferrous sulfate  Anion gap metabolic acidosis -56/2 calculated AG= 17.1  Unvaccinated Covid patient -After counseling patient on the risks associated with Covid and her current diagnosis patient agreed to accept Covid vaccination prior to discharge.  Goals of care -12/2 Palliative Care Consult; patient is noncompliant, with medication and diet.  Has been counseled extensively on the risks of noncompliance.  Change patient status to DNR, discuss appointment HCPOA, and advanced directives.    DVT prophylaxis: SCD Code Status: Full Family Communication: 12/1 family at bedside for explanation of plan of care all questions answered Status is: Inpatient    Dispo: The patient is from: Home              Anticipated d/c is to: Home              Anticipated d/c date is: 12/4              Patient currently unstable      Consultants:  Vascular surgery PCCM  Procedures/Significant Events:  11/25 Admit  11/26 Patient with ongoing mild back pain and headache 11/27 Remains on Esmolol gtt, BP/HR controlled  11/28 off cardizem and esmolol gtt 11/29 precedex off in am     CT A / P 11/25 > descending thoracic aorta dissection, trace left pleural  effusion, trace haziness surrounding the uncinate process of pancreas. CTA chest 11/25 > type B thoracic aortic dissection involving the entire abdominal aorta and possibly through to the origin of the right common femoral artery, possible mild acute pancreatitis.  I have personally reviewed and interpreted all radiology studies and my findings are as above.  VENTILATOR SETTINGS:    Cultures 11/25 flu > neg. 11/25 COVID >  neg.  Antimicrobials:    Devices    LINES / TUBES:  Art line 11/25 > out    Continuous Infusions: . sodium chloride       Objective: Vitals:   11/17/20 0010 11/17/20 0421 11/17/20 0551 11/17/20 0733  BP: 118/74 118/75 133/78 120/72  Pulse: 93 90  92  Resp: (!) 21 20  18   Temp: 98.7 F (37.1 C) 98.8 F (37.1 C)  98.2 F (36.8 C)  TempSrc: Oral Oral  Oral  SpO2: 96% 96%  96%  Weight:  104.4 kg    Height:        Intake/Output Summary (Last 24 hours) at 11/17/2020 0827 Last data filed at 11/16/2020 2000 Gross per 24 hour  Intake 720 ml  Output --  Net 720 ml   Filed Weights   11/15/20 0647 11/16/20 0500 11/17/20 0421  Weight: 105.6 kg 104.5 kg 104.4 kg    Examination:  General: A/O x4, No acute respiratory distress Eyes: negative scleral hemorrhage, negative anisocoria, negative icterus ENT: Negative Runny nose, negative gingival bleeding, Neck:  Negative scars, masses, torticollis, lymphadenopathy, JVD Lungs: Clear to auscultation bilaterally without wheezes or crackles Cardiovascular: Regular rate and rhythm without murmur gallop or rub normal S1 and S2 Abdomen: OBESE, negative abdominal pain, nondistended, positive soft, bowel sounds, no rebound, no ascites, no appreciable mass Extremities: No significant cyanosis, clubbing, or edema bilateral lower extremities Skin: Negative rashes, lesions, ulcers Psychiatric:  Negative depression, negative anxiety, negative fatigue, negative mania  Central nervous system:  Cranial nerves II through XII intact, tongue/uvula midline, all extremities muscle strength 5/5, sensation intact throughout, negative dysarthria, negative expressive aphasia, negative receptive aphasia.  .     Data Reviewed: Care during the described time interval was provided by me .  I have reviewed this patient's available data, including medical history, events of note, physical examination, and all test results as part of my  evaluation.  CBC: Recent Labs  Lab 11/13/20 0811 11/14/20 0131 11/15/20 0438 11/16/20 0152 11/17/20 0115  WBC 6.5 8.6 8.0 7.5 8.1  HGB 8.9* 8.3* 9.1* 9.4* 9.0*  HCT 29.4* 27.8* 28.5* 31.4* 30.2*  MCV 77.8* 78.1* 76.8* 78.9* 78.6*  PLT PLATELET CLUMPS NOTED ON SMEAR, UNABLE TO ESTIMATE 231 283 313 938   Basic Metabolic Panel: Recent Labs  Lab 11/12/20 0110 11/12/20 0110 11/13/20 0811 11/14/20 0131 11/15/20 0438 11/15/20 0943 11/16/20 0152 11/17/20 0115  NA 132*   < > 134* 137 136  --  136 138  K 4.2   < > 3.6 3.2* 4.1  --  3.6 3.8  CL 104   < > 104 105 101  --  101 101  CO2 17*   < > 18* 19* 22  --  22 24  GLUCOSE 94   < > 119* 92 97  --  98 100*  BUN 8   < > 9 6 7   --  10 15  CREATININE 0.78   < > 0.99 0.96 0.83  --  0.82 0.88  CALCIUM 9.0   < > 9.3 9.1 9.5  --  9.7 9.9  MG 1.9  --  1.6*  --   --  1.8 1.8 2.0  PHOS  --   --   --   --   --   --  4.4 4.6   < > = values in this interval not displayed.   GFR: Estimated Creatinine Clearance: 115.9 mL/min (by C-G formula based on SCr of 0.88 mg/dL). Liver Function Tests: Recent Labs  Lab 11/12/20 0110 11/13/20 0811 11/16/20 0152 11/17/20 0115  AST 14* 23 21 20   ALT 12 16 16 17   ALKPHOS 41 44 47 43  BILITOT 0.7 1.0 1.0 0.9  PROT 6.7 6.8 7.7 7.7  ALBUMIN 2.9* 3.0* 3.3* 3.3*   No results for input(s): LIPASE, AMYLASE in the last 168 hours. No results for input(s): AMMONIA in the last 168 hours. Coagulation Profile: No results for input(s): INR, PROTIME in the last 168 hours. Cardiac Enzymes: No results for input(s): CKTOTAL, CKMB, CKMBINDEX, TROPONINI in the last 168 hours. BNP (last 3 results) No results for input(s): PROBNP in the last 8760 hours. HbA1C: No results for input(s): HGBA1C in the last 72 hours. CBG: No results for input(s): GLUCAP in the last 168 hours. Lipid Profile: No results for input(s): CHOL, HDL, LDLCALC, TRIG, CHOLHDL, LDLDIRECT in the last 72 hours. Thyroid Function Tests: No  results for input(s): TSH, T4TOTAL, FREET4, T3FREE, THYROIDAB in the last 72 hours. Anemia Panel: No results for input(s): VITAMINB12, FOLATE, FERRITIN, TIBC, IRON, RETICCTPCT in the last 72 hours. Sepsis Labs: No results for input(s): PROCALCITON, LATICACIDVEN in the last 168 hours.  Recent Results (from the past 240 hour(s))  Resp Panel by RT-PCR (Flu A&B, Covid) Nasopharyngeal Swab     Status: None   Collection Time: 11/09/20  7:59 PM   Specimen: Nasopharyngeal Swab; Nasopharyngeal(NP) swabs in vial transport medium  Result Value Ref Range Status   SARS Coronavirus 2 by RT PCR NEGATIVE NEGATIVE Final    Comment: (NOTE) SARS-CoV-2 target nucleic acids are NOT DETECTED.  The SARS-CoV-2 RNA is generally detectable in upper respiratory specimens during the acute phase of infection. The lowest concentration of SARS-CoV-2 viral copies this assay can detect is 138 copies/mL. A negative result does not preclude SARS-Cov-2 infection and should not be used as the sole basis for treatment or other patient management decisions. A negative result may occur with  improper specimen collection/handling, submission of specimen other than nasopharyngeal swab, presence of viral mutation(s) within the areas targeted by this assay, and inadequate number of viral copies(<138 copies/mL). A negative result must be combined with clinical observations, patient history, and epidemiological information. The expected result is Negative.  Fact Sheet for Patients:  EntrepreneurPulse.com.au  Fact Sheet for Healthcare Providers:  IncredibleEmployment.be  This test is no t yet approved or cleared by the [place] FDA and  has been authorized for detection and/or diagnosis of SARS-CoV-2 by FDA under an Emergency Use Authorization (EUA). This EUA will remain  in effect (meaning this test can be used) for the duration of the COVID-19 declaration under Section 564(b)(1) of  the Act, 21 U.S.C.section 360bbb-3(b)(1), unless the authorization is terminated  or revoked sooner.       Influenza A by PCR NEGATIVE NEGATIVE Final   Influenza B by PCR NEGATIVE NEGATIVE Final    Comment: (NOTE) The Xpert Xpress SARS-CoV-2/FLU/RSV plus assay is intended as an aid in the diagnosis of influenza from Nasopharyngeal swab specimens and should not be used as a sole basis for treatment. Nasal washings  and aspirates are unacceptable for Xpert Xpress SARS-CoV-2/FLU/RSV testing.  Fact Sheet for Patients: EntrepreneurPulse.com.au  Fact Sheet for Healthcare Providers: IncredibleEmployment.be  This test is not yet approved or cleared by the [place] FDA and has been authorized for detection and/or diagnosis of SARS-CoV-2 by FDA under an Emergency Use Authorization (EUA). This EUA will remain in effect (meaning this test can be used) for the duration of the COVID-19 declaration under Section 564(b)(1) of the Act, 21 U.S.C. section 360bbb-3(b)(1), unless the authorization is terminated or revoked.  Performed at [facility], [place] [address], [place], [place] [address]   MRSA PCR Screening     Status: None   Collection Time: 11/09/20 11:07 PM   Specimen: Nasopharyngeal  Result Value Ref Range Status   MRSA by PCR NEGATIVE NEGATIVE Final    Comment:        The GeneXpert MRSA Assay (FDA approved for NASAL specimens only), is one component of a comprehensive MRSA colonization surveillance program. It is not intended to diagnose MRSA infection nor to guide or monitor treatment for MRSA infections. Performed at [facility], [place] [address] [place], [place] [address]          Radiology Studies: No results found.      Scheduled Meds: . amLODipine  10 mg Oral Daily  . carvedilol  25 mg Oral BID WC  . Chlorhexidine Gluconate Cloth  6 each Topical Daily  . cloNIDine  0.1 mg Oral TID  .  dicyclomine  20 mg Oral BID  . ferrous sulfate  325 mg Oral Daily  . folic acid  1 mg Oral Daily  . hydrALAZINE  75 mg Oral Q8H  . hydrochlorothiazide  50 mg Oral Daily  . losartan  100 mg Oral Daily  . magnesium oxide  400 mg Oral QID  . multivitamin with minerals  1 tablet Oral Daily  . nicotine  14 mg Transdermal Daily  . prazosin  2 mg Oral QHS  . QUEtiapine  25 mg Oral BID  . thiamine  100 mg Oral Daily   Or  . thiamine  100 mg Intravenous Daily   Continuous Infusions: . sodium chloride       LOS: 8 days    Time spent:40 min    Erza Mothershead, Geraldo Docker, MD [facility] Pager [phone]  If 7PM-7AM, please contact night-coverage [email or web address] Password [facility] 11/17/2020, 8:27 AM

## 2020-11-17 NOTE — Evaluation (Signed)
Physical Therapy Evaluation/ Discharge Patient Details Name: Robin Guerrero MRN: [id number] DOB: Jun 18, 1986 Today's Date: 11/17/2020   History of Present Illness  34 yo admitted 11/25 with abdominal pain and nausea found to have type B aortic dissection and pancreatitis managed medically. PMhx: HTN, pancreatitis, depression  Clinical Impression  Pt very pleasant, moving well and maintained SBP <140 throughout. Dr.Hawken present in room at beginning of session and stated he would addend order to allow <140 SBP. Joi does not demonstrate strength, balance or mobility deficits and maintained SBP within parameters throughout with slow steady gait. Pt safe for return home and no further therapy needs. Encouraged continued mobility with monitoring vitals at home and acutely.    Sitting in bed on arrival 135/87 Standing after 10' of gait 118/79 119/69 after 5 stairs 106/73 after full flight of stairs and descent    Follow Up Recommendations No PT follow up    Equipment Recommendations  None recommended by PT    Recommendations for Other Services       Precautions / Restrictions Precautions Precautions: None      Mobility  Bed Mobility Overal bed mobility: Independent                  Transfers Overall transfer level: Independent                  Ambulation/Gait Ambulation/Gait assistance: Independent Gait Distance (Feet): 350 Feet Assistive device: None Gait Pattern/deviations: WFL(Within Functional Limits)   Gait velocity interpretation: >2.62 ft/sec, indicative of community ambulatory    Stairs Stairs: Yes Stairs assistance: Modified independent (Device/Increase time) Stair Management: Alternating pattern;Forwards;One rail Right Number of Stairs: 11 General stair comments: pt with good stabiliy with stairs and maintained BP throughout  Wheelchair Mobility    Modified Rankin (Stroke Patients Only)       Balance Overall balance assessment:  Modified Independent                                           Pertinent Vitals/Pain Pain Assessment: Faces Faces Pain Scale: Hurts a little bit Pain Location: back and into shoulder    Home Living Family/patient expects to be discharged to:: Private residence Living Arrangements: Children (9 and 12) Available Help at Discharge: Family;Friend(s) (neighbor and brother and cousins) Type of Home: Apartment Home Access: Stairs to enter   Technical brewer of Steps: 3 flights Home Layout: One level Home Equipment: None      Prior Function Level of Independence: Independent               Hand Dominance        Extremity/Trunk Assessment   Upper Extremity Assessment Upper Extremity Assessment: Overall WFL for tasks assessed    Lower Extremity Assessment Lower Extremity Assessment: Overall WFL for tasks assessed       Communication   Communication: No difficulties  Cognition Arousal/Alertness: Awake/alert Behavior During Therapy: WFL for tasks assessed/performed Overall Cognitive Status: Within Functional Limits for tasks assessed                                        General Comments      Exercises     Assessment/Plan    PT Assessment Patent does not need any further PT services  PT Problem List  PT Treatment Interventions      PT Goals (Current goals can be found in the Care Plan section)  Acute Rehab PT Goals Patient Stated Goal: return home to my kids PT Goal Formulation: All assessment and education complete, DC therapy    Frequency     Barriers to discharge        Co-evaluation               AM-PAC PT "6 Clicks" Mobility  Outcome Measure Help needed turning from your back to your side while in a flat bed without using bedrails?: None Help needed moving from lying on your back to sitting on the side of a flat bed without using bedrails?: None Help needed moving to and from a bed to a  chair (including a wheelchair)?: None Help needed standing up from a chair using your arms (e.g., wheelchair or bedside chair)?: None Help needed to walk in hospital room?: None Help needed climbing 3-5 steps with a railing? : None 6 Click Score: 24    End of Session   Activity Tolerance: Patient tolerated treatment well Patient left: in bed;with call bell/phone within reach Nurse Communication: Mobility status PT Visit Diagnosis: Other abnormalities of gait and mobility (R26.89)    Time: 0076-2263 PT Time Calculation (min) (ACUTE ONLY): 18 min   Charges:   PT Evaluation $PT Eval Low Complexity: [place], PT Acute Rehabilitation Services Pager: [phone] Office: [place] 11/17/2020, 1:54 PM

## 2020-11-17 NOTE — Evaluation (Signed)
Occupational Therapy Evaluation Patient Details Name: Robin Guerrero MRN: [id number] DOB: 11/12/1986 Today's Date: 11/17/2020    History of Present Illness 34 yo admitted 11/25 with abdominal pain and nausea found to have type B aortic dissection and pancreatitis managed medically. PMhx: HTN, pancreatitis, depression   Clinical Impression   Patient admitted with the above diagnosis.  OT session: patient educated on proper breathing during activities of daily living to avoid holding her breath, energy conservation for activity tolerance and BP management, and the importance of self monitoring of BP at home.  Patient with good teach back and verbalized understanding.  Patient BP at the beginning of session seated edge of bed:  121/75.  OT had the patient pick up 10 towels from the floor, fold them, and place them up on the shelf above the toilet (one at a time).  The patient then walked from her room to [facility] without stopping, entered the stairwell, and the patient climbed 2 full flights of stairs (patient took one rest break between flights) and descended the 2 flights.  She walked from 4C stairs, back to her room on 4E without a rest break.  Patient with complaint of 2/4 dyspnea once we got back to her room.  BP at the conclusion of this was 132/88.  Patient has no concerning OT needs in the acute setting.  OT will maintain the order in case any changes in her status occur.      Follow Up Recommendations  No OT follow up    Equipment Recommendations  None recommended by OT    Recommendations for Other Services       Precautions / Restrictions Precautions Precautions: None Precaution Comments: SBP <140 Restrictions Weight Bearing Restrictions: No      Mobility Bed Mobility Overal bed mobility: Independent                  Transfers Overall transfer level: Independent                    Balance Overall balance assessment: No apparent balance deficits (not formally  assessed)                                         ADL either performed or assessed with clinical judgement   ADL Overall ADL's : At baseline                                       General ADL Comments: No dificits noted     Vision Baseline Vision/History: No visual deficits Patient Visual Report: No change from baseline       Perception     Praxis      Pertinent Vitals/Pain Pain Assessment: No/denies pain Faces Pain Scale: Hurts a little bit Pain Location: back and into shoulder     Hand Dominance Right   Extremity/Trunk Assessment Upper Extremity Assessment Upper Extremity Assessment: Overall WFL for tasks assessed   Lower Extremity Assessment Lower Extremity Assessment: Overall WFL for tasks assessed       Communication Communication Communication: No difficulties   Cognition Arousal/Alertness: Awake/alert Behavior During Therapy: WFL for tasks assessed/performed Overall Cognitive Status: Within Functional Limits for tasks assessed  Home Living Family/patient expects to be discharged to:: Private residence Living Arrangements: Children Available Help at Discharge: Family;Friend(s);Available PRN/intermittently Type of Home: Apartment Home Access: Stairs to enter Entrance Stairs-Number of Steps: 3 flights   Home Layout: One level     Bathroom Shower/Tub: Teacher, early years/pre: Standard Bathroom Accessibility: Yes   Home Equipment: None   Additional Comments: Has a wrist BP cuff at home to take BP      Prior Functioning/Environment Level of Independence: Independent                 OT Problem List: Other (comment) (< 140 SBP)      OT Treatment/Interventions:      OT Goals(Current goals can be found in the care plan section) Acute Rehab OT Goals Patient Stated Goal: Hoping to go home tomorrow OT Goal Formulation: With  patient Time For Goal Achievement: 11/24/20 Potential to Achieve Goals: Good  OT Frequency:     Barriers to D/C:  none noted          Co-evaluation              AM-PAC OT "6 Clicks" Daily Activity     Outcome Measure Help from another person eating meals?: None Help from another person taking care of personal grooming?: None Help from another person toileting, which includes using toliet, bedpan, or urinal?: None Help from another person bathing (including washing, rinsing, drying)?: None Help from another person to put on and taking off regular upper body clothing?: None Help from another person to put on and taking off regular lower body clothing?: None 6 Click Score: 24   End of Session Nurse Communication: Precautions  Activity Tolerance: Patient tolerated treatment well Patient left: in bed;with call bell/phone within reach  OT Visit Diagnosis: Other (comment)                Time: 9326-7124 OT Time Calculation (min): 20 min Charges:  OT General Charges $OT Visit: 1 Visit OT Evaluation $OT Eval Moderate Complexity: 1 Mod  11/17/2020  Rich, OTR/L  Acute Rehabilitation Services  Office:  [phone]   Metta Clines 11/17/2020, 4:32 PM

## 2020-11-17 NOTE — Consult Note (Signed)
Palliative Medicine Inpatient Consult Note  Reason for consult:  Goals of Care "patient is noncompliant, with medication and diet.  Has been counseled extensively on the risks of noncompliance.  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  Palliative care was asked to get involved for goals of care discussions.  Clinical Assessment/Goals of Care: I have reviewed medical records including EPIC notes, labs and imaging, received report from bedside RN, assessed the patient who had just returned with physical therapy from a walk.  Physical therapy shares that Robin Guerrero did exceptionally well and was able to mobilize up and down stairs without great insult.   I met with Robin Guerrero to further discuss diagnosis prognosis, GOC, EOL wishes, disposition and options.   I introduced Palliative Medicine as specialized medical care for people living with serious illness. It focuses on providing relief from the symptoms and stress of a serious illness. The goal is to improve quality of life for both the patient and the family.   Robin Guerrero shares that she lives in [place], [place].  She is not married though she does have 2 sons daughter who is 52 and a son who is 73.  She is not presently working though is in school for [employer]. She does not consider herself an overtly religious person.   A detailed discussion was had today regarding advanced directives -emphasized the importance of completing these.    Concepts specific to code status, artifical feeding and hydration, continued IV antibiotics and rehospitalization was had.   I shared with Robin Guerrero that a code in her current health state could result in catastrophic outcomes with tremendous effect on her present quality of life. She was apprehensive to go into great detail regarding this topic she shared with me "of course I would want everything done to continue living".  Try to again reiterate what living looks like to her and what by all measures is considered living "well".  I asked her if it would ever be acceptable for her to be maintained on machines for the rest of her life.  She shares that she would not want to live like that but she certainly would again want a trial of all measures if her condition was able to be improved upon.   Patient shares that her goals right now is to live for her children.   I asked her what she understands from the medical team about her present conditions. She shares, "I know it's not good." We reviewed what an aortic dissection is and how it is important to maintain medication compliance. Shared that rupture can occur which would likely result in fatal outcomes.   Discussed the importance of continued conversation with family and their  medical providers regarding overall plan of care and treatment options, ensuring decisions are within the context of the patients values and GOCs. ________________________________________________________ Addendum: I reached out to the patients sister in law, Robin Guerrero and brother, Robin Guerrero. Discussed the significance of her present health situation. Discussed the importance on completing advance directives. They are to some degree in tune with her present health state though did not know the severity of her aortic dissection.  Robin Guerrero requested to speak to Dr. Sherral Hammers for a more comprehensive medical update. She also requested to speak to the MSW to identify what would happen to Robin Guerrero children if she died suddenly.  Decision Maker: Patient is presently able to make decisions for herself though if for  any reason she were incapacitated she would defer to her brother Robin Guerrero and his daughter Robin Guerrero to make decisions in her honor.  SUMMARY OF RECOMMENDATIONS   Full Code / Full Scope of treatment   Appreciate primary medical team providing family comprehensive medical update  Appreciate medical social work team helping Robin Guerrero and her family in identifying the necessary paperwork to ensure safe placement for Robin Guerrero's children if something catastrophic should ever occur  Appreciate Spiritual Team to aid in advance directives  Code Status/Advance Care Planning: FULL CODE   Palliative Prophylaxis:   Mobility  Additional Recommendations (Limitations, Scope, Preferences):  Continue current scope of care   Psycho-social/Spiritual:   Desire for further Chaplaincy support: No  Additional Recommendations: Education on chronic disease conditions   Prognosis: Poor in the setting of non-complicance, illicit substance abuse  Discharge Planning: Will discharge to home  ROS  Oral Intake %:   I/O:   Bowel Movements:   Mobility:   Vitals:   11/17/20 0733 11/17/20 1254  BP: 120/72 131/75  Pulse: 92 94  Resp: 18 18  Temp: 98.2 F (36.8 C) 98.2 F (36.8 C)  SpO2: 96% 96%    Intake/Output Summary (Last 24 hours) at 11/17/2020 1332 Last data filed at 11/16/2020 2000 Gross per 24 hour  Intake 240 ml  Output --  Net 240 ml   Last Weight  Most recent update: 11/17/2020  4:27 AM   Weight  104.4 kg (230 lb 2.6 oz)           Gen: Young AA F in NAD HEENT: moist mucous membranes CV: Regular rate and rhythm  PULM: clear to auscultation bilaterally  ABD: soft/nontende  EXT: No edema  Neuro: Alert and oriented x4  PPS: 70%   This conversation/these recommendations were discussed with patient primary care team, Dr. Sherral Hammers  Time In: 1400 Time Out: 1510 Total Time: 70 Greater than 50%  of this time was spent counseling and coordinating care related to the above  assessment and plan.  [facility] Team Team Cell Phone: [phone] Please utilize secure chat with additional questions, if there is no response within 30 minutes please call the above phone number  Palliative Medicine Team providers are available by phone from 7am to 7pm daily and can be reached through the team cell phone.  Should this patient require assistance outside of these hours, please call the patient's attending physician.

## 2020-11-17 NOTE — Plan of Care (Signed)

## 2020-11-18 DIAGNOSIS — I7102 Dissection of abdominal aorta: Secondary | ICD-10-CM

## 2020-11-18 DIAGNOSIS — F1910 Other psychoactive substance abuse, uncomplicated: Secondary | ICD-10-CM

## 2020-11-18 LAB — COMPREHENSIVE METABOLIC PANEL
ALT: 19 U/L (ref 0–44)
AST: 20 U/L (ref 15–41)
Albumin: 3.4 g/dL — ABNORMAL LOW (ref 3.5–5.0)
Alkaline Phosphatase: 48 U/L (ref 38–126)
Anion gap: 13 (ref 5–15)
BUN: 21 mg/dL — ABNORMAL HIGH (ref 6–20)
CO2: 23 mmol/L (ref 22–32)
Calcium: 9.7 mg/dL (ref 8.9–10.3)
Chloride: 98 mmol/L (ref 98–111)
Creatinine, Ser: 0.81 mg/dL (ref 0.44–1.00)
GFR, Estimated: >60 mL/min (ref >60)
Glucose, Bld: 104 mg/dL — ABNORMAL HIGH (ref 70–99)
Potassium: 3.8 mmol/L (ref 3.5–5.1)
Sodium: 134 mmol/L — ABNORMAL LOW (ref 135–145)
Total Bilirubin: 0.6 mg/dL (ref 0.3–1.2)
Total Protein: 7.6 g/dL (ref 6.5–8.1)

## 2020-11-18 LAB — CBC
HCT: 30.1 % — ABNORMAL LOW (ref 36.0–46.0)
Hemoglobin: 9.2 g/dL — ABNORMAL LOW (ref 12.0–15.0)
MCH: 24.1 pg — ABNORMAL LOW (ref 26.0–34.0)
MCHC: 30.6 g/dL (ref 30.0–36.0)
MCV: 78.8 fL — ABNORMAL LOW (ref 80.0–100.0)
Platelets: 361 10*3/uL (ref 150–400)
RBC: 3.82 MIL/uL — ABNORMAL LOW (ref 3.87–5.11)
RDW: 19.7 % — ABNORMAL HIGH (ref 11.5–15.5)
WBC: 9 10*3/uL (ref 4.0–10.5)
nRBC: 0 % (ref 0.0–0.2)

## 2020-11-18 LAB — MAGNESIUM: Magnesium: 2 mg/dL (ref 1.7–2.4)

## 2020-11-18 LAB — PHOSPHORUS: Phosphorus: 5.1 mg/dL — ABNORMAL HIGH (ref 2.5–4.6)

## 2020-11-18 MED ORDER — MEGESTROL ACETATE 40 MG PO TABS: 40.0000 mg | ORAL_TABLET | Freq: Two times a day (BID) | ORAL | 5 refills | Status: DC

## 2020-11-18 NOTE — Progress Notes (Addendum)
  Progress Note    11/18/2020 12:41 PM * No surgery found *  Subjective: She continues to endorse upper back and upper anterior chest pain that "comes and goes".  She is currently sitting up in bed eating lunch and on the phone with family member.   Vitals:   11/18/20 0856 11/18/20 1134  BP: 117/75 102/74  Pulse: 94 91  Resp:  17  Temp:  98.9 F (37.2 C)  SpO2:  97%    Physical Exam: Cardiac: rate and rhythm regular Lungs: Clear to auscultation bilaterally Systolic blood pressure range upper 90s to low 130s overnight. Heart rate 90s Telemetry: Normal sinus rhythm  CBC    Component Value Date/Time   WBC 9.0 11/18/2020 0228   RBC 3.82 (L) 11/18/2020 0228   HGB 9.2 (L) 11/18/2020 0228   HCT 30.1 (L) 11/18/2020 0228   PLT 361 11/18/2020 0228   MCV 78.8 (L) 11/18/2020 0228   MCH 24.1 (L) 11/18/2020 0228   MCHC 30.6 11/18/2020 0228   RDW 19.7 (H) 11/18/2020 0228   LYMPHSABS 0.9 11/09/2020 1733   MONOABS 0.7 11/09/2020 1733   EOSABS 0.0 11/09/2020 1733   BASOSABS 0.0 11/09/2020 1733    BMET    Component Value Date/Time   NA 134 (L) 11/18/2020 0228   K 3.8 11/18/2020 0228   CL 98 11/18/2020 0228   CO2 23 11/18/2020 0228   GLUCOSE 104 (H) 11/18/2020 0228   BUN 21 (H) 11/18/2020 0228   CREATININE 0.81 11/18/2020 0228   CALCIUM 9.7 11/18/2020 0228   GFRNONAA >60 11/18/2020 0228   GFRAA >60 08/27/2020 0351    No intake or output data in the 24 hours ending 11/18/20 [place] Scheduled Meds: . amLODipine  10 mg Oral Daily  . carvedilol  25 mg Oral BID WC  . Chlorhexidine Gluconate Cloth  6 each Topical Daily  . cloNIDine  0.1 mg Oral TID  . dicyclomine  20 mg Oral BID  . ferrous sulfate  325 mg Oral Daily  . folic acid  1 mg Oral Daily  . hydrALAZINE  75 mg Oral Q8H  . hydrochlorothiazide  50 mg Oral Daily  . losartan  100 mg Oral Daily  . multivitamin with minerals  1 tablet Oral Daily  . nicotine  14 mg Transdermal Daily  . prazosin  2  mg Oral QHS  . QUEtiapine  25 mg Oral BID  . thiamine  100 mg Oral Daily   Or  . thiamine  100 mg Intravenous Daily   Continuous Infusions: . sodium chloride     PRN Meds:.[CANCELED] Place/Maintain arterial line **AND** sodium chloride, acetaminophen, ALPRAZolam, docusate sodium, fentaNYL (SUBLIMAZE) injection, hydrALAZINE, ondansetron (ZOFRAN) IV, polyethylene glycol  Assessment:34 y.o.femaleWith type B aortic dissection.  Blood pressure and heart rate stable and controlled on oral agents. Her pain appears to be minimal. Inpatient day #10     Plan: -Outpatient follow-up in one month with CTA of chest, abd and pelvis.   Risa Grill, PA-C [facility] [phone] 11/18/2020  12:41 PM   I agree with the above.  Her blood pressure appears to be well controlled.  She is stable for discharge from vascular perspective with plans for follow-up CT scan in 1 month.  Annamarie Major

## 2020-11-18 NOTE — Progress Notes (Signed)
Pt discharged home with family. IV and telemetry box removed. Pt received discharge instructions and all questions were answered. Pt was given home meds from [facility]. Pt left with all of her belongings. Pt discharged via wheelchair and was accompanied by this RN.

## 2020-11-18 NOTE — Discharge Summary (Signed)
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  SBP goal<120 -Amlodipine 10 mg daily -Coreg 25 mg BID -Clonidine 0.1 mg TID -Hydralazine75 mg TID -HCTZ 50 mg daily -Losartan 100 mg daily -Prazosin 2 mg daily -12/1 consult nutrition; education on heart healthy diet (Na<2 g/day) -12/3 PT/OT consult work with patient aggressively monitor BP at all times.  SBP goal<120 ensure that you chart BP during workout -Follow-up with Dr. Jamelle Haring vascular surgery in 4 weeks.  Office will call to set up your appointment.  [phone]  Malignant HTN -See aortic dissection  Agitation -Xanax 0.5 mg PRN -Seroquel 25 mg BID  Hx depression, EtOH use, tobacco use -continue home gabapentin, thiamine, folate, nicotine patch -Patient counseled at length prior to discharge that she can no longer use tobacco, EtOH or she risks DEATH  Hypokalemia -Potassium goal> 4  Hypomagnesemia -Magnesium goal> 2   Microcytic anemia - H/H stable, transfuse for Hgb < 7 - continue ferrous sulfate  Anion gap metabolic acidosis -06/2 calculated AG= 17.1  Unvaccinated Covid patient -After counseling patient on the risks associated with Covid and her current diagnosis patient agreed to accept Covid vaccination prior to discharge. -When team arrived to administer Covid vaccination patient declined  Goals of care -12/2 [place]; patient is noncompliant, with medication and diet.  Has been counseled extensively on the risks of noncompliance.  Change patient status to DNR,  discuss appointment HCPOA, and advanced directives.  Discharge Diagnoses:  Active Problems:   Descending thoracic dissection [facility])   Discharge Condition: Stable  Diet recommendation: Healthy  Filed Weights   11/16/20 0500 11/17/20 0421 11/18/20 3762  Weight: 104.5 kg 104.4 kg 103.8 kg    History of present illness:  Robin Hunteris a 34 y.o.BF PMHx Depression, HTN, Aortic dissection, Prolonged QT interval, EtOH abuse, Tobacco abuse, EtOH pancreatitis,Shewas admitted 11/25 with large type B aortic dissection involving the entire abdominal aorta and possibly through to the origin of the right common femoral artery.  She initially presented to [facility] ED 11/23 with N/V, abd pain. She was hypertensive and it was felt this was due to her missing antihypertensive meds due to vomiting. She was discharged with bentyl, zofran and instructed to follow up with PCP.  11/24 and 11/25 she had ongoing pain which worsened 11/25 and radiated lower into her back prompting her to return to [facility] ED. She had CT abdomen which revealed a descending thoracic aortic dissection. Case was discussed with Dr. Luan Pulling of vascular surgery who recommended transfer to [facility] and tight BP control for the night.   Hospital Course:  See above  Procedures: 11/25 Admit  11/26 Patient with ongoing mild back pain and headache 11/27 Remains on Esmolol gtt, BP/HR controlled 11/28 off cardizem and esmolol gtt 11/29 precedex off in am    CT A / P 11/25 > descending thoracic aorta dissection, trace left pleural effusion, trace haziness surrounding the uncinate process of pancreas. CTA chest 11/25 > type B thoracic aortic dissection involving the entire abdominal aorta and possibly through to the origin of the right common femoral artery, possible mild acute pancreatitis.   Consultations: Vascular surgery PCCM  Discharge Exam: Vitals:   11/18/20 0757 11/18/20 0856 11/18/20 1134 11/18/20 1639  BP:  109/67 117/75 102/74 127/77  Pulse: 92 94 91   Resp: 17  17   Temp: 98.9 F (37.2 C)  98.9 F (37.2 C)   TempSrc: Oral  Oral   SpO2: 98%  97%   Weight:      Height:        General: A/O x4, No acute respiratory distress Eyes: negative scleral hemorrhage, negative anisocoria, negative icterus ENT: Negative Runny nose, negative gingival bleeding, Neck:  Negative scars, masses, torticollis, lymphadenopathy, JVD Lungs: Clear to auscultation bilaterally without wheezes or crackles Cardiovascular: Regular rate and rhythm without murmur gallop or rub normal S1 and S2  Discharge Instructions   Allergies as of 11/18/2020      Reactions   Ativan [lorazepam] Other (See Comments)   Disorientated, combative      Medication List    STOP taking these medications   gabapentin 100 MG capsule Commonly known as: NEURONTIN   HYDROcodone-acetaminophen 5-325 MG tablet Commonly known as: NORCO/VICODIN   Metoprolol Tartrate 75 MG Tabs   pantoprazole 40 MG tablet Commonly known as: PROTONIX   simethicone 80 MG chewable tablet Commonly known as: MYLICON     TAKE these medications   acetaminophen 325 MG tablet Commonly known as: TYLENOL Take 650 mg by mouth every 6 (six) hours as needed for mild pain, fever or headache.   amLODipine 10 MG tablet Commonly known as: NORVASC Take 1 tablet (10 mg total) by mouth daily. What changed:   medication strength  how much to take   carvedilol 25 MG tablet Commonly known as: COREG Take 1 tablet (25 mg total) by mouth 2 (two) times daily with a meal.   cloNIDine 0.1 MG tablet Commonly known as: CATAPRES Take 1 tablet (0.1 mg total) by mouth 3 (three) times daily.   dicyclomine 20 MG tablet Commonly known as: BENTYL Take 1 tablet (20 mg total) by mouth 2 (two) times daily.   docusate sodium 100 MG capsule Commonly known as: Colace Take 1 capsule (100 mg total) by mouth daily as needed.   ferrous sulfate 325 (65 FE) MG tablet Take 1  tablet (325 mg total) by mouth daily.   folic acid 1 MG tablet Commonly known as: FOLVITE Take 1 tablet (1 mg total) by mouth daily.   hydrALAZINE 50 MG tablet Commonly known as: APRESOLINE Take 1.5 tablets (75 mg total) by mouth every 8 (eight) hours.   hydrochlorothiazide 50 MG tablet Commonly known as: HYDRODIURIL Take 1 tablet (50 mg total) by mouth daily.   losartan 100 MG tablet Commonly known as: COZAAR Take 1 tablet (100 mg total) by mouth daily.   megestrol 40 MG tablet Commonly known as: MEGACE Take 1 tablet (40 mg total) by mouth 2 (two) times daily. Can increase to two tablets twice a day in the event of heavy bleeding What changed:   when to take this  additional instructions   multivitamin with minerals Tabs tablet Take 1 tablet by mouth daily.   nicotine 14 mg/24hr patch Commonly known as: NICODERM CQ - dosed in mg/24 hours Place 1 patch (14 mg total) onto the skin daily.   ondansetron 4 MG disintegrating tablet Commonly known as: Zofran ODT Take 1 tablet (4 mg total) by mouth every 8 (eight) hours as needed for nausea.   prazosin 2 MG capsule Commonly known as: MINIPRESS Take 1 capsule (2 mg total) by mouth at bedtime.  QUEtiapine 25 MG tablet Commonly known as: SEROQUEL Take 1 tablet (25 mg total) by mouth 2 (two) times daily.   thiamine 100 MG tablet Take 1 tablet (100 mg total) by mouth daily.      Allergies  Allergen Reactions  . Ativan [Lorazepam] Other (See Comments)    Disorientated, combative    Follow-up Information    Cherre Robins, MD In 4 weeks.   Specialties: Vascular Surgery, Interventional Cardiology Why: Office will call you to arrange your appt (sent) Contact information: [place] [address] [phone]                The results of significant diagnostics from this hospitalization (including imaging, microbiology, ancillary and laboratory) are listed below for reference.    Significant  Diagnostic Studies: CT Abdomen Pelvis W Contrast  Result Date: 11/09/2020 CLINICAL DATA:  Abdominal distension, generalized abdominal pain. Associated nausea. Elevated lipase 125. EXAM: CT ABDOMEN AND PELVIS WITH CONTRAST TECHNIQUE: Multidetector CT imaging of the abdomen and pelvis was performed using the standard protocol following bolus administration of intravenous contrast. CONTRAST:  154mL OMNIPAQUE IOHEXOL 300 MG/ML  SOLN COMPARISON:  CT abdomen pelvis 08/21/2020, ultrasound abdomen 08/26/2020 FINDINGS: Lower chest: Interval decrease of a trace left pleural effusion with associated passive changes of the left lower lobe. Subsegmental atelectasis within the right lower lobe. Interval resolution of trace right pleural effusion. No pericardial effusion. Hepatobiliary: No focal liver abnormality. No gallstones, gallbladder wall thickening, or pericholecystic fluid. No biliary dilatation. Pancreas: No focal lesion. Vague trace haziness surrounding the uncinate process. No pseudocyst formation. No surrounding inflammatory changes. No main pancreatic ductal dilatation. Spleen: Normal in size without focal abnormality. Adrenals/Urinary Tract: No adrenal nodule bilaterally. Bilateral kidneys enhance symmetrically. No hydronephrosis. No hydroureter. The urinary bladder is unremarkable. Stomach/Bowel: Stomach is within normal limits. No evidence of bowel wall thickening or dilatation. Appendix appears normal. Vascular/Lymphatic: Interval development of a descending thoracic aorta dissection (only partially visualized and collimated off view superiorly) that extends through the abdominal aorta and into the bilateral common iliac arteries. The celiac artery, superior mesenteric artery artery, left renal artery, and left common iliac artery appear to originate from the false lumen. The dissection flap continues into the right common iliac artery. It is unclear the external iliac arteries are involved due to timing of  contrast. No lymphadenopathy identified. Reproductive: Uterus and bilateral adnexa are unremarkable. Other: No intraperitoneal free fluid. No intraperitoneal free gas. No organized fluid collection. Musculoskeletal: Tiny fat containing umbilical hernia. No suspicious lytic or blastic osseous lesions. No acute displaced fracture. IMPRESSION: 1. Interval development of a descending thoracic aorta dissection that extends through the abdominal aorta and terminates likely within the left common iliac artery. Recommend CTA chest, abdomen, pelvis for further evaluation. 2. Trace left pleural effusion. 3. Vague trace haziness surrounding the uncinate process consistent with acute pancreatitis in a patient with elevated lipase levels. No pseudocyst formation. These results were called by telephone at the time of interpretation on 11/09/2020 at 7:20 pm to provider Lacretia Leigh , who verbally acknowledged these results. Electronically Signed   By: Iven Finn M.D.   On: 11/09/2020 19:23   DG Chest Port 1 View  Result Date: 11/10/2020 CLINICAL DATA:  Respiratory failure. EXAM: PORTABLE CHEST 1 VIEW COMPARISON:  CT 11/09/2020.  Chest x-ray 01/23/2015. FINDINGS: Thoracic aorta is again noted to be prominent in this patient with known aortic dissection. Cardiomegaly. Pulmonary venous congestion. Mild bilateral interstitial prominence. Small left pleural effusion. Mild  CHF cannot be excluded. Mild bibasilar atelectasis. No acute bony abnormality identified. IMPRESSION: 1. Thoracic aorta again noted to be prominent in this patient with known aortic dissection. 2. Cardiomegaly with pulmonary venous congestion. Mild bilateral interstitial prominence. Small left pleural effusion. Mild CHF cannot be excluded. Electronically Signed   By: Marcello Moores  Register   On: 11/10/2020 06:07   CT Angio Chest/Abd/Pel for Dissection W and/or W/WO  Result Date: 11/09/2020 CLINICAL DATA:  Aortic dissection noted on CT abdomen pelvis  11/09/2020 EXAM: CT ANGIOGRAPHY CHEST, ABDOMEN AND PELVIS TECHNIQUE: Non-contrast CT of the chest was initially obtained. Multidetector CT imaging through the chest, abdomen and pelvis was performed using the standard protocol during bolus administration of intravenous contrast. Multiplanar reconstructed images and MIPs were obtained and reviewed to evaluate the vascular anatomy. CONTRAST:  134mL OMNIPAQUE IOHEXOL 350 MG/ML SOLN COMPARISON:  CT abdomen pelvis 11/09/2020, CT abdomen pelvis 08/21/2020. FINDINGS: VASCULAR Aorta: No intramural hematoma. No aneurysmal dilatation of the thoracic and abdominal aorta. No periaortic fat stranding. Descending thoracic aorta dissection originating just distal to the origin of the left subclavian artery. The dissection is noted to extend through the entire descending thoracic aorta as well as the supra and infrarenal abdominal aorta. The dissection flap extends into the right common iliac artery as well as the right external iliac artery. Celiac: The celiac artery originates from the true lumen of the aortic dissection. SMA: The superior mesenteric artery originates from the true lumen of the aortic dissection. Renals: There are 2 left and 2 right renal arteries. The two left renal arteries originate from the true lumen of the dissection. The two right renal arteries originates from the false lumen. IMA: The inferior mesenteric artery originates from the true lumen of the aortic dissection. Inflow: The left common iliac artery originates from the true lumen of the aortic dissection. The dissection flap extends into the right common iliac artery as well as the right external iliac artery. The extent of the dissection through the right external iliac artery is not well evaluated due to timing of intravenous contrast. Outflow: It is unclear if the dissection flap extends to the right common femoral artery due to timing of contrast. Veins: Unremarkable. Review of the MIP images  confirms the above findings. NON-VASCULAR Cardiovascular: Normal heart size. No significant pericardial effusion. No coronary artery calcifications. Mediastinum/Nodes: No enlarged mediastinal, hilar, or axillary lymph nodes. Thyroid gland, trachea, and esophagus demonstrate no significant findings. Lungs/Pleura: Nonspecific ground-glass airspace opacities at the right apex measuring 1.1 and 0.7 cm (5:27). Linear atelectasis within the lingula. Nonspecific thin walled subcentimeter cystic change within the right middle lobe. No focal consolidation. There is a 4 mm subpleural right upper lobe pulmonary nodule (5:42). No pulmonary mass. Redemonstration of a trace left pleural effusion with associated passive atelectasis. No right pleural effusion. No pneumothorax. No pleural effusion or pneumothorax. Diffuse mild bronchial wall thickening. Hepatobiliary: No focal liver abnormality. No gallstones, gallbladder wall thickening, or pericholecystic fluid. No biliary dilatation. Pancreas: No focal lesion. Vague trace haziness surrounding the uncinate process not well visualized on this study. No pseudocyst formation. No surrounding inflammatory changes. No main pancreatic ductal dilatation. Spleen: Normal in size without focal abnormality. Adrenals/Urinary Tract: No adrenal nodule bilaterally. Possible slight delayed nephrogram on the right compared to left. No hydronephrosis. No hydroureter. The urinary bladder is unremarkable. No filling defects within the partially opacified bilateral renal collecting systems and urinary bladder lumen. Stomach/Bowel: Stomach is within normal limits. No evidence of bowel wall thickening or  dilatation. No pneumatosis. Normal bowel wall enhancement. Appendix appears normal. Lymphatic: No lymphadenopathy. Reproductive: Uterus and bilateral adnexa are unremarkable. Other: No intraperitoneal free fluid. No intraperitoneal free gas. No organized fluid collection. Musculoskeletal: No acute or  significant osseous findings. Review of the MIP images confirms the above findings. IMPRESSION: 1. Type B thoracic aorta dissection extends to involve the entire abdominal aorta and likely terminates in the right external iliac artery. The dissection possibly continues through to the origin of the right common femoral artery; however, evaluation is limited due to timing of contrast. 2. Please note, unlike previously mentioned on CT abd/pelvis 11/09/20, the celiac, superior mesenteric, inferior mesenteric, two left renal , and the left common iliac arteries all originate from the true lumen. 3. The two right renal arteries originate from the false lumen with interval development of a slightly delayed right nephrogram. 4. Possible mild acute pancreatitis. Electronically Signed   By: Iven Finn M.D.   On: 11/09/2020 20:28    Microbiology: Recent Results (from the past 240 hour(s))  Resp Panel by RT-PCR (Flu A&B, Covid) Nasopharyngeal Swab     Status: None   Collection Time: 11/09/20  7:59 PM   Specimen: Nasopharyngeal Swab; Nasopharyngeal(NP) swabs in vial transport medium  Result Value Ref Range Status   SARS Coronavirus 2 by RT PCR NEGATIVE NEGATIVE Final    Comment: (NOTE) SARS-CoV-2 target nucleic acids are NOT DETECTED.  The SARS-CoV-2 RNA is generally detectable in upper respiratory specimens during the acute phase of infection. The lowest concentration of SARS-CoV-2 viral copies this assay can detect is 138 copies/mL. A negative result does not preclude SARS-Cov-2 infection and should not be used as the sole basis for treatment or other patient management decisions. A negative result may occur with  improper specimen collection/handling, submission of specimen other than nasopharyngeal swab, presence of viral mutation(s) within the areas targeted by this assay, and inadequate number of viral copies(<138 copies/mL). A negative result must be combined with clinical observations, patient  history, and epidemiological information. The expected result is Negative.  Fact Sheet for Patients:  EntrepreneurPulse.com.au  Fact Sheet for Healthcare Providers:  IncredibleEmployment.be  This test is no t yet approved or cleared by the [place] FDA and  has been authorized for detection and/or diagnosis of SARS-CoV-2 by FDA under an Emergency Use Authorization (EUA). This EUA will remain  in effect (meaning this test can be used) for the duration of the COVID-19 declaration under Section 564(b)(1) of the Act, 21 U.S.C.section 360bbb-3(b)(1), unless the authorization is terminated  or revoked sooner.       Influenza A by PCR NEGATIVE NEGATIVE Final   Influenza B by PCR NEGATIVE NEGATIVE Final    Comment: (NOTE) The Xpert Xpress SARS-CoV-2/FLU/RSV plus assay is intended as an aid in the diagnosis of influenza from Nasopharyngeal swab specimens and should not be used as a sole basis for treatment. Nasal washings and aspirates are unacceptable for Xpert Xpress SARS-CoV-2/FLU/RSV testing.  Fact Sheet for Patients: EntrepreneurPulse.com.au  Fact Sheet for Healthcare Providers: IncredibleEmployment.be  This test is not yet approved or cleared by the [place] FDA and has been authorized for detection and/or diagnosis of SARS-CoV-2 by FDA under an Emergency Use Authorization (EUA). This EUA will remain in effect (meaning this test can be used) for the duration of the COVID-19 declaration under Section 564(b)(1) of the Act, 21 U.S.C. section 360bbb-3(b)(1), unless the authorization is terminated or revoked.  Performed at [facility], [address]  [place], [place] [address]   MRSA PCR Screening     Status: None   Collection Time: 11/09/20 11:07 PM   Specimen: Nasopharyngeal  Result Value Ref Range Status   MRSA by PCR NEGATIVE NEGATIVE Final    Comment:        The  GeneXpert MRSA Assay (FDA approved for NASAL specimens only), is one component of a comprehensive MRSA colonization surveillance program. It is not intended to diagnose MRSA infection nor to guide or monitor treatment for MRSA infections. Performed at [facility], [place] [address], [place],  [address]      Labs: Basic Metabolic Panel: Recent Labs  Lab 11/13/20 [phone] 11/13/20 8341 11/14/20 0131 11/15/20 0438 11/15/20 0943 11/16/20 0152 11/17/20 0115 11/18/20 0228  NA 134*   < > 137 136  --  136 138 134*  K 3.6   < > 3.2* 4.1  --  3.6 3.8 3.8  CL 104   < > 105 101  --  101 101 98  CO2 18*   < > 19* 22  --  22 24 23   GLUCOSE 119*   < > 92 97  --  98 100* 104*  BUN 9   < > 6 7  --  10 15 21*  CREATININE 0.99   < > 0.96 0.83  --  0.82 0.88 0.81  CALCIUM 9.3   < > 9.1 9.5  --  9.7 9.9 9.7  MG 1.6*  --   --   --  1.8 1.8 2.0 2.0  PHOS  --   --   --   --   --  4.4 4.6 5.1*   < > = values in this interval not displayed.   Liver Function Tests: Recent Labs  Lab 11/13/20 0811 11/16/20 0152 11/17/20 0115 11/18/20 0228  AST 23 21 20 20   ALT 16 16 17 19   ALKPHOS 44 47 43 48  BILITOT 1.0 1.0 0.9 0.6  PROT 6.8 7.7 7.7 7.6  ALBUMIN 3.0* 3.3* 3.3* 3.4*   No results for input(s): LIPASE, AMYLASE in the last 168 hours. No results for input(s): AMMONIA in the last 168 hours. CBC: Recent Labs  Lab 11/14/20 0131 11/15/20 0438 11/16/20 0152 11/17/20 0115 11/18/20 0228  WBC 8.6 8.0 7.5 8.1 9.0  HGB 8.3* 9.1* 9.4* 9.0* 9.2*  HCT 27.8* 28.5* 31.4* 30.2* 30.1*  MCV 78.1* 76.8* 78.9* 78.6* 78.8*  PLT 231 283 313 338 361   Cardiac Enzymes: No results for input(s): CKTOTAL, CKMB, CKMBINDEX, TROPONINI in the last 168 hours. BNP: BNP (last 3 results) No results for input(s): BNP in the last 8760 hours.  ProBNP (last 3 results) No results for input(s): PROBNP in the last 8760 hours.  CBG: No results for input(s): GLUCAP in the last 168  hours.     Signed:  Dia Crawford, MD [facility] [phone] pager

## 2020-11-18 NOTE — Progress Notes (Signed)
   Palliative Medicine Inpatient Follow Up Note   Reason for consult:  Goals of Care "patient is noncompliant, with medication and diet.  Has been counseled extensively on the risks of noncompliance.  Change patient status to DNR, discuss appointment HCPOA, and advanced directives."  HPI:  Per intake H&P -->  Robin Hunteris a 34 y.o.BF PMHx Depression, HTN, Aortic dissection, Prolonged QT interval, EtOH abuse, Tobacco abuse, EtOH pancreatitis,Shewas admitted 11/25 with large type B aortic dissectioninvolving the entire abdominal aorta and possibly through to the origin of the right common femoral artery.  Palliative care was asked to get involved for goals of care discussions.  Today's Discussion (11/18/2020): Chart reviewed. Patient evaluated at bedside.  She appears to be in no distress this afternoon.  She asked "when am I going home"?  I shared with Robin Guerrero that there is an order for discharge that had been placed by Dr. Sherral Hammers this morning.   I brought up the topic of yesterday in terms of advance care planning.  Together we were able to complete her HCPOA and living well.  Our medical social worker Renette Butters was able to notarized the documentation.  A copy was provided to both Robin Guerrero and one placed in the chart.  A MOST form was also completed this afternoon as below:  Cardiopulmonary Resuscitation: Attempt Resuscitation (CPR)  Medical Interventions: Full Scope of Treatment: Use intubation, advanced airway interventions, mechanical ventilation, cardioversion as indicated, medical treatment, IV fluids, etc, also provide comfort measures. Transfer to the hospital if indicated Comments: Would not want to be in a vegetative state.   Antibiotics: Antibiotics if indicated  IV Fluids: IV fluids if indicated  Feeding Tube: Feeding tube for a defined trial period   Discussed the importance of continued conversation with family and their  medical providers regarding  overall plan of care and treatment options, ensuring decisions are within the context of the patients values and GOCs.  Provided  "Hard Choices for [employer]" booklet.   Questions and concerns addressed   Objective Assessment: Vital Signs Vitals:   11/18/20 0856 11/18/20 1134  BP: 117/75 102/74  Pulse: 94 91  Resp:  17  Temp:  98.9 F (37.2 C)  SpO2:  97%   No intake or output data in the 24 hours ending 11/18/20 1310 Last Weight  Most recent update: 11/18/2020  6:31 AM   Weight  103.8 kg (228 lb 13.4 oz)           Gen: Young AA F in NAD HEENT: moist mucous membranes CV: Regular rate and rhythm  PULM: clear to auscultation bilaterally  ABD: soft/nontende  EXT: No edema  Neuro: Alert and oriented x4  SUMMARY OF RECOMMENDATIONS Full Code / Full Scope of treatment   MOST completed  Advance Directives completed  Time Spent: 60 Greater than 50% of the time was spent in counseling and coordination of care ______________________________________________________________________________________ [place] Team Team Cell Phone: [phone] Please utilize secure chat with additional questions, if there is no response within 30 minutes please call the above phone number  Palliative Medicine Team providers are available by phone from 7am to 7pm daily and can be reached through the team cell phone.  Should this patient require assistance outside of these hours, please call the patient's attending physician.

## 2020-12-11 ENCOUNTER — Ambulatory Visit
Admission: RE | Admit: 2020-12-11 | Discharge: 2020-12-11 | Disposition: A | Discharge status: Home / Self Care | Payer: Medicaid Other | Source: Ambulatory Visit | Attending: Vascular Surgery | Admitting: Vascular Surgery

## 2020-12-11 DIAGNOSIS — I71012 Dissection of descending thoracic aorta: Secondary | ICD-10-CM

## 2020-12-11 MED ORDER — IOPAMIDOL (ISOVUE-370) INJECTION 76%
100.0000 mL | Freq: Once | INTRAVENOUS | Status: AC | PRN
  Administered 2020-12-11: 100 mL via INTRAVENOUS

## 2020-12-12 ENCOUNTER — Encounter: Payer: Self-pay | Admitting: Vascular Surgery

## 2020-12-12 ENCOUNTER — Ambulatory Visit (INDEPENDENT_AMBULATORY_CARE_PROVIDER_SITE_OTHER): Payer: Medicaid Other | Admitting: Vascular Surgery

## 2020-12-12 ENCOUNTER — Other Ambulatory Visit: Payer: Self-pay

## 2020-12-12 ENCOUNTER — Encounter: Payer: Medicaid Other | Admitting: Vascular Surgery

## 2020-12-12 VITALS — BP 110/77 | HR 96 | Temp 98.5°F | Resp 20 | Ht 69.0 in | Wt 232.0 lb

## 2020-12-12 DIAGNOSIS — I7101 Dissection of thoracic aorta: Secondary | ICD-10-CM

## 2020-12-12 DIAGNOSIS — I71019 Dissection of thoracic aorta, unspecified: Secondary | ICD-10-CM

## 2020-12-12 NOTE — Progress Notes (Signed)
   ASSESSMENT & PLAN:  Robin Guerrero is a 34 y.o. female with stable type B aortic dissection. Plan to manage medically with periodic CTA surveillance to ensure no aneurysmal degeneration. Adequate outpatient BP control. She is having difficulty with the number of drugs she is required to take. Will refer to cardiology. Follow up with me in 1 year with repeat CTA. Counseled that she may continue non-strenuous exercise and should not need disability benefits from this illness.  SUBJECTIVE:  No chest pain.   OBJECTIVE:  BP 110/77 (BP Location: Left Arm, Patient Position: Sitting, Cuff Size: Large)   Pulse 96   Temp 98.5 F (36.9 C)   Resp 20   Ht 5\' 9"  (1.753 m)   Wt 232 lb (105.2 kg)   SpO2 96%   BMI 34.26 kg/m   Constitutional: well appearing. no acute distress. Cardiac: RRR. Vascular: well perfused Pulmonary: unlabored Abdominal: soft  CBC Latest Ref Rng & Units 11/18/2020 11/17/2020 11/16/2020  WBC 4.0 - 10.5 K/uL 9.0 8.1 7.5  Hemoglobin 12.0 - 15.0 g/dL 14/01/2020) 5.2(D) 7.8(E)  Hematocrit 36.0 - 46.0 % 30.1(L) 30.2(L) 31.4(L)  Platelets 150 - 400 K/uL 361 338 313     CMP Latest Ref Rng & Units 11/18/2020 11/17/2020 11/16/2020  Glucose 70 - 99 mg/dL 14/01/2020) 536(R) 98  BUN 6 - 20 mg/dL 443(X) 15 10  Creatinine 0.44 - 1.00 mg/dL 54(M 0.86 7.61  Sodium 135 - 145 mmol/L 134(L) 138 136  Potassium 3.5 - 5.1 mmol/L 3.8 3.8 3.6  Chloride 98 - 111 mmol/L 98 101 101  CO2 22 - 32 mmol/L 23 24 22   Calcium 8.9 - 10.3 mg/dL 9.7 9.9 9.7  Total Protein 6.5 - 8.1 g/dL 7.6 7.7 7.7  Total Bilirubin 0.3 - 1.2 mg/dL 0.6 0.9 1.0  Alkaline Phos 38 - 126 U/L 48 43 47  AST 15 - 41 U/L 20 20 21   ALT 0 - 44 U/L 19 17 16     CrCl cannot be calculated (Patient's most recent lab result is older than the maximum 21 days allowed.).  9.50. , MD [facility] Phone Number: [phone] 12/12/2020 4:54 PM

## 2020-12-18 ENCOUNTER — Other Ambulatory Visit: Payer: Self-pay | Admitting: Physician Assistant

## 2020-12-18 ENCOUNTER — Telehealth: Payer: Self-pay

## 2020-12-18 DIAGNOSIS — I71012 Dissection of descending thoracic aorta: Secondary | ICD-10-CM

## 2020-12-18 DIAGNOSIS — I7101 Dissection of thoracic aorta: Secondary | ICD-10-CM

## 2020-12-18 MED ORDER — AMLODIPINE BESYLATE 10 MG PO TABS: 10.0000 mg | ORAL_TABLET | Freq: Every day | ORAL | 0 refills | Status: DC

## 2020-12-18 MED ORDER — PRAZOSIN HCL 2 MG PO CAPS: 2.0000 mg | ORAL_CAPSULE | Freq: Every day | ORAL | 0 refills | Status: DC

## 2020-12-18 MED ORDER — CLONIDINE HCL 0.1 MG PO TABS: 0.1000 mg | ORAL_TABLET | Freq: Three times a day (TID) | ORAL | 0 refills | Status: DC

## 2020-12-18 MED ORDER — LOSARTAN POTASSIUM 100 MG PO TABS: 100.0000 mg | ORAL_TABLET | Freq: Every day | ORAL | 0 refills | Status: DC

## 2020-12-18 MED ORDER — HYDROCHLOROTHIAZIDE 50 MG PO TABS: 50.0000 mg | ORAL_TABLET | Freq: Every day | ORAL | 0 refills | Status: DC

## 2020-12-18 MED ORDER — CARVEDILOL 25 MG PO TABS: 25.0000 mg | ORAL_TABLET | Freq: Two times a day (BID) | ORAL | 0 refills | Status: DC

## 2020-12-18 NOTE — Progress Notes (Signed)
   Patient called for refills on antihypertensive medications.  She was upset and was unable to see her PCP before she ran out.  I refilled a 1 month supply until she is able to be seen by her PCP on 12/29/20.  Mosetta Pigeon PA-C

## 2020-12-18 NOTE — Telephone Encounter (Signed)
Patient called office upset requesting refills on her medications due to her pcp not refilling her medications until she was seen. I spoke with Marquita Palms from [facility]. He advised that her PCP is unable to refill her meds due to medication changes since she was last seen in their office in October. He offered patient a sooner appt and she hung up. I spoke with Retia Passe and she agreed to do a ONE TIME medication refill for Hypertensive medication until her referral to cardiology is complete. Patient contacted and she became upset when I attempted to explain that our office is unable to refill QUEtiapine (Seroquel) due to that being a Depression/Anxiety medication. I explained that we are able to send in the one time refill because it was to help treat the Aortic Dissection but the other medication (Seroquel) is not used to treat her diagnosis. Patient advised she will need to follow up with PCP for any additional refills for that medication. She voiced her understanding.

## 2021-01-30 NOTE — Progress Notes (Signed)
Rescheduled

## 2021-01-31 ENCOUNTER — Ambulatory Visit: Payer: Self-pay | Admitting: Cardiology

## 2021-01-31 DIAGNOSIS — Z8679 Personal history of other diseases of the circulatory system: Secondary | ICD-10-CM | POA: Insufficient documentation

## 2021-02-09 NOTE — Progress Notes (Signed)
Patient referred by Trey Sailors, PA for hypertension, h/o type B dissection  Subjective:   Robin Guerrero, female    DOB: November 10, 1986, 35 y.o.   MRN: [id number]   Chief Complaint  Patient presents with  . Malignant hypertension  . aneurysum  . New Patient (Initial Visit)     HPI  35 y.o. African American female with hypertension, h/o medically treated type B aortic dissection, prior h/o EtOH, tobacco abuse.  Patient was hospitalized in 10/2020 with complaints of nausea, abdominal pain, and was found to have large type B acute aortic dissection.  Over the next 10 days, patient was managed medically, with control of hypertension. Patient's blood pressure is now well controlled on multiple medications.  Patient endorses noncompliance with medications prior to her dissection.  She has history of tobacco and alcohol abuse, but is trying to stay clean since then.  Patient reports that she still has occasional shoulder and back pains, but they are no different compared to before and have never completely gone away since her hospitalization. Subsequently, patient has undergone follow-up CT scan that showed grossly unchanged type B dissection, details below.  On a separate note, patient reports episodes of numbness in both her arms, worse with certain movement.  She also reports having pain in both her hands, especially while at work, where she works on [employer].    Past Medical History:  Diagnosis Date  . Descending thoracic dissection ([place])   . History of anemia    no current med.  . Hypertension    states under control with med., has been on med. x 3 yr.     Past Surgical History:  Procedure Laterality Date  . FOREIGN BODY REMOVAL Left 02/10/2015   Procedure: ATTEMPTED EXPLANTATION OF BIRTH CONTROL DEVICE LEFT ARM;  Surgeon: Johnathan Hausen, MD;  Location: [place];  Service: General;  Laterality: Left;  . FOREIGN BODY REMOVAL Left 05/12/2015   Procedure:  REMOVAL OF LEFT ARM BIRTH CONTROL DEVICE;  Surgeon: Johnathan Hausen, MD;  Location: [place];  Service: General;  Laterality: Left;  . SUBMANDIBULAR GLAND EXCISION Right 05/01/2009     Social History   Tobacco Use  Smoking Status Current Some Day Smoker  . Years: 5.00  . Types: Cigarettes  Smokeless Tobacco Never Used  Tobacco Comment   1 pack/week    Social History   Substance and Sexual Activity  Alcohol Use Yes   Comment: occasionally     Family History  Problem Relation Age of Onset  . Breast cancer Mother 88     Current Outpatient Medications on File Prior to Visit  Medication Sig Dispense Refill  . acetaminophen (TYLENOL) 325 MG tablet Take 650 mg by mouth every 6 (six) hours as needed for mild pain, fever or headache.    Marland Kitchen amLODipine (NORVASC) 10 MG tablet Take 1 tablet (10 mg total) by mouth daily. 30 tablet 0  . carvedilol (COREG) 25 MG tablet Take 1 tablet (25 mg total) by mouth 2 (two) times daily with a meal. 60 tablet 0  . cloNIDine (CATAPRES) 0.1 MG tablet Take 1 tablet (0.1 mg total) by mouth 3 (three) times daily. 90 tablet 0  . dicyclomine (BENTYL) 20 MG tablet Take 1 tablet (20 mg total) by mouth 2 (two) times daily. 20 tablet 0  . docusate sodium (COLACE) 100 MG capsule Take 1 capsule (100 mg total) by mouth daily as needed. 30 capsule 0  . ferrous sulfate 325 (  65 FE) MG tablet Take 1 tablet (325 mg total) by mouth daily. 30 tablet 0  . folic acid (FOLVITE) 1 MG tablet Take 1 tablet (1 mg total) by mouth daily. 30 tablet 0  . hydrALAZINE (APRESOLINE) 50 MG tablet Take 1.5 tablets (75 mg total) by mouth every 8 (eight) hours. 135 tablet 0  . losartan (COZAAR) 100 MG tablet Take 1 tablet (100 mg total) by mouth daily. 30 tablet 0  . megestrol (MEGACE) 40 MG tablet Take 1 tablet (40 mg total) by mouth 2 (two) times daily. Can increase to two tablets twice a day in the event of heavy bleeding 60 tablet 5  . Multiple Vitamin (MULTIVITAMIN WITH  MINERALS) TABS tablet Take 1 tablet by mouth daily. 30 tablet 0  . nicotine (NICODERM CQ - DOSED IN MG/24 HOURS) 14 mg/24hr patch Place 1 patch (14 mg total) onto the skin daily. 28 patch 0  . ondansetron (ZOFRAN ODT) 4 MG disintegrating tablet Take 1 tablet (4 mg total) by mouth every 8 (eight) hours as needed for nausea. 10 tablet 0  . prazosin (MINIPRESS) 2 MG capsule Take 1 capsule (2 mg total) by mouth at bedtime. 30 capsule 0  . QUEtiapine (SEROQUEL) 25 MG tablet Take 1 tablet (25 mg total) by mouth 2 (two) times daily. 60 tablet 0  . thiamine 100 MG tablet Take 1 tablet (100 mg total) by mouth daily. 30 tablet 0   No current facility-administered medications on file prior to visit.    Cardiovascular and other pertinent studies:  EKG 02/12/2021: Sinus tachycardia 103 bpm Poor R wave progression Nonspecific T-abnormality  CT angiogram 12/11/2020: 1. Grossly unchanged appearance of known type B descending thoracic aortic dissection extending throughout the normal caliber abdominal aorta to the level of the bifurcation of the right common iliac artery. The dissection does not involve the branch vessels of the aortic arch or result in a hemodynamically significant narrowing of any of the mesenteric branches. 2. 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 The remainder of the mesenteric branch vessels arise from the true lumen. 3. No evidence of ascending thoracic aortic aneurysm or dissection on this nongated examination.  Recent labs: 12/29/2020: Glucose 116, BUN/Cr 18/1.26. EGFR 64. Na/K 142/3.7. Rest of the CMP normal H/H 8.7/28.7. MCV 89. Platelets 571   Review of Systems  Cardiovascular: Negative for chest pain, dyspnea on exertion, leg swelling, palpitations and syncope.  Musculoskeletal: Positive for back pain and stiffness.  Neurological: Positive for paresthesias.         Vitals:   02/12/21 1403  BP: 107/68  Pulse: (!) 105  Resp: 17   Temp: 97.8 F (36.6 C)  SpO2: 97%     Body mass index is 34.56 kg/m. Filed Weights   02/12/21 1403  Weight: 234 lb (106.1 kg)     Objective:   Physical Exam Vitals and nursing note reviewed.  Constitutional:      General: She is not in acute distress. Neck:     Vascular: No JVD.  Cardiovascular:     Rate and Rhythm: Normal rate and regular rhythm.     Pulses: Normal pulses.     Heart sounds: Normal heart sounds. No murmur heard.   Pulmonary:     Effort: Pulmonary effort is normal.     Breath sounds: Normal breath sounds. No wheezing or rales.  Musculoskeletal:     Right lower leg: No edema.     Left lower leg: No edema.  Assessment & Recommendations:   35 y.o. African American female with hypertension, h/o medically treated type B aortic dissection, prior h/o EtOH, tobacco abuse.  Hypertension: Controlled.  No change made today to current antihypertensive therapy. It is rather difficult to wean her off her medications without risking acute increase in blood pressure. Will check echocardiogram.   Type B aortic dissection: Acute episode in November 2021, with grossly unchanged CT scan thereafter. No known family h/o aortic syndrome. Patient has two kids. I will refer her to Dr. Lattie Corns for genetic counseling.  Continue follow-up with vascular surgery.  Paresthesias: I suspect she may have neuropathy in her bilateral upper extremities, either related to prior use of excess alcohol, or related to her work ([employer]). Consider neurology referral. Defer to PCP.   Thank you for referring the patient to [place]. Please feel free to contact with any questions.   Nigel Mormon, MD Pager: [phone] Office: [phone]

## 2021-02-12 ENCOUNTER — Ambulatory Visit: Payer: Medicaid Other | Admitting: Cardiology

## 2021-02-12 ENCOUNTER — Encounter: Payer: Self-pay | Admitting: Cardiology

## 2021-02-12 ENCOUNTER — Other Ambulatory Visit: Payer: Self-pay

## 2021-02-12 VITALS — BP 107/68 | HR 105 | Temp 97.8°F | Resp 17 | Ht 69.0 in | Wt 234.0 lb

## 2021-02-12 DIAGNOSIS — I10 Essential (primary) hypertension: Secondary | ICD-10-CM

## 2021-02-12 DIAGNOSIS — Z8679 Personal history of other diseases of the circulatory system: Secondary | ICD-10-CM

## 2021-02-21 ENCOUNTER — Other Ambulatory Visit: Payer: Medicaid Other

## 2021-02-22 ENCOUNTER — Ambulatory Visit: Payer: Medicaid Other | Admitting: Obstetrics and Gynecology

## 2021-02-26 ENCOUNTER — Encounter: Payer: Self-pay | Admitting: Obstetrics and Gynecology

## 2021-02-26 ENCOUNTER — Other Ambulatory Visit: Payer: Self-pay

## 2021-02-26 ENCOUNTER — Ambulatory Visit (INDEPENDENT_AMBULATORY_CARE_PROVIDER_SITE_OTHER): Payer: Medicaid Other | Admitting: Obstetrics and Gynecology

## 2021-02-26 VITALS — BP 143/92 | HR 108 | Wt 235.0 lb

## 2021-02-26 DIAGNOSIS — N912 Amenorrhea, unspecified: Secondary | ICD-10-CM

## 2021-02-26 NOTE — Progress Notes (Signed)
Pt states last cycle was in January- bleeding a few days and spotting again on the 14th.  Pt states cycle in December was normal.   Pt states cycles have been heavy in the past.   Pt states UPT at home were all negative.

## 2021-02-26 NOTE — Progress Notes (Signed)
35 yo P2 here for the evaluation of a 35-month history of amenorrhea. Patient reports history of menorrhagia treated with megace with good results. She reports several negative home pregnancy tests. Patient denies any pelvic pain or abnormal discharge. This is the first time this has happened to her. Patient denies galactorrhea. She denies visual changes. She denies frequent headaches  Past Medical History:  Diagnosis Date  . Descending thoracic dissection ([place])   . History of anemia    no current med.  . Hypertension    states under control with med., has been on med. x 3 yr.   Past Surgical History:  Procedure Laterality Date  . FOREIGN BODY REMOVAL Left 02/10/2015   Procedure: ATTEMPTED EXPLANTATION OF BIRTH CONTROL DEVICE LEFT ARM;  Surgeon: Johnathan Hausen, MD;  Location: [place];  Service: General;  Laterality: Left;  . FOREIGN BODY REMOVAL Left 05/12/2015   Procedure: REMOVAL OF LEFT ARM BIRTH CONTROL DEVICE;  Surgeon: Johnathan Hausen, MD;  Location: [place];  Service: General;  Laterality: Left;  . SUBMANDIBULAR GLAND EXCISION Right 05/01/2009   Family History  Problem Relation Age of Onset  . Breast cancer Mother 57  . Heart disease Mother   . Hypertension Mother    Social History   Tobacco Use  . Smoking status: Current Some Day Smoker    Packs/day: 0.25    Years: 5.00    Pack years: 1.25    Types: Cigarettes  . Smokeless tobacco: Never Used  . Tobacco comment: 1 pack/week  Vaping Use  . Vaping Use: Never used  Substance Use Topics  . Alcohol use: Yes    Comment: occasionally  . Drug use: No   ROS See pertinent in HPI. All other systems reviewed and negative  Blood pressure (!) 143/92, pulse (!) 108, weight 235 lb (106.6 kg). GENERAL: Well-developed, well-nourished female in no acute distress.  NEURO: alert and oriented x 3  A/P 35 yo with 1- month history of amenorrhea - Patient did not leave a urine sample prior to leaving for  UPT - Patient advised to keep a menstrual calendar - If persistent amenorrhea, patient to return for further evaluation - Amenorrhea could be secondary to long term use of Seroquel

## 2021-03-06 ENCOUNTER — Encounter: Payer: Self-pay | Admitting: General Practice

## 2021-03-09 ENCOUNTER — Other Ambulatory Visit: Payer: Medicaid Other

## 2021-04-26 ENCOUNTER — Encounter (HOSPITAL_COMMUNITY): Payer: Self-pay

## 2021-04-26 ENCOUNTER — Ambulatory Visit: Payer: Medicaid Other | Admitting: Cardiology

## 2021-04-26 ENCOUNTER — Emergency Department (HOSPITAL_COMMUNITY): Payer: Medicaid Other

## 2021-04-26 ENCOUNTER — Emergency Department (HOSPITAL_COMMUNITY)
Admission: EM | Admit: 2021-04-26 | Discharge: 2021-04-26 | Disposition: A | Discharge status: Home / Self Care | Payer: Medicaid Other | Attending: Emergency Medicine | Admitting: Emergency Medicine

## 2021-04-26 DIAGNOSIS — I7103 Dissection of thoracoabdominal aorta: Secondary | ICD-10-CM | POA: Diagnosis not present

## 2021-04-26 DIAGNOSIS — K219 Gastro-esophageal reflux disease without esophagitis: Secondary | ICD-10-CM | POA: Diagnosis not present

## 2021-04-26 DIAGNOSIS — R1030 Lower abdominal pain, unspecified: Secondary | ICD-10-CM | POA: Diagnosis present

## 2021-04-26 DIAGNOSIS — F17210 Nicotine dependence, cigarettes, uncomplicated: Secondary | ICD-10-CM | POA: Diagnosis not present

## 2021-04-26 DIAGNOSIS — I10 Essential (primary) hypertension: Secondary | ICD-10-CM | POA: Diagnosis not present

## 2021-04-26 DIAGNOSIS — Z79899 Other long term (current) drug therapy: Secondary | ICD-10-CM | POA: Diagnosis not present

## 2021-04-26 DIAGNOSIS — R0789 Other chest pain: Secondary | ICD-10-CM

## 2021-04-26 LAB — CBC
HCT: 33 % — ABNORMAL LOW (ref 36.0–46.0)
Hemoglobin: 11 g/dL — ABNORMAL LOW (ref 12.0–15.0)
MCH: 33.2 pg (ref 26.0–34.0)
MCHC: 33.3 g/dL (ref 30.0–36.0)
MCV: 99.7 fL (ref 80.0–100.0)
Platelets: 276 10*3/uL (ref 150–400)
RBC: 3.31 MIL/uL — ABNORMAL LOW (ref 3.87–5.11)
RDW: 14.6 % (ref 11.5–15.5)
WBC: 7.4 10*3/uL (ref 4.0–10.5)
nRBC: 0 % (ref 0.0–0.2)

## 2021-04-26 LAB — I-STAT BETA HCG BLOOD, ED (MC, WL, AP ONLY): I-stat hCG, quantitative: <5 m[IU]/mL (ref <5)

## 2021-04-26 LAB — COMPREHENSIVE METABOLIC PANEL
ALT: 24 U/L (ref 0–44)
AST: 25 U/L (ref 15–41)
Albumin: 3.7 g/dL (ref 3.5–5.0)
Alkaline Phosphatase: 52 U/L (ref 38–126)
Anion gap: 8 (ref 5–15)
BUN: 17 mg/dL (ref 6–20)
CO2: 24 mmol/L (ref 22–32)
Calcium: 8.6 mg/dL — ABNORMAL LOW (ref 8.9–10.3)
Chloride: 104 mmol/L (ref 98–111)
Creatinine, Ser: 1.51 mg/dL — ABNORMAL HIGH (ref 0.44–1.00)
GFR, Estimated: 46 mL/min — ABNORMAL LOW (ref >60)
Glucose, Bld: 195 mg/dL — ABNORMAL HIGH (ref 70–99)
Potassium: 3.1 mmol/L — ABNORMAL LOW (ref 3.5–5.1)
Sodium: 136 mmol/L (ref 135–145)
Total Bilirubin: 1.7 mg/dL — ABNORMAL HIGH (ref 0.3–1.2)
Total Protein: 7.6 g/dL (ref 6.5–8.1)

## 2021-04-26 LAB — TROPONIN I (HIGH SENSITIVITY): Troponin I (High Sensitivity): 5 ng/L (ref <18)

## 2021-04-26 LAB — LIPASE, BLOOD: Lipase: 72 U/L — ABNORMAL HIGH (ref 11–51)

## 2021-04-26 IMAGING — CT CT CTA ABD/PEL W/CM AND/OR W/O CM
3 of 8 series · 13 of 36 positions shown · non-contrast
Comparison: CT the chest, abdomen pelvis-11/09/2020; CT abdomen
pelvis-08/21/2020; 08/18/2020; chest CT-06/26/2004

CLINICAL DATA: Follow-up thoracic aortic dissection. Evaluate for
abdominal aortic aneurysm.

EXAM:
CT ANGIOGRAPHY CHEST, ABDOMEN AND PELVIS
TECHNIQUE: Non-contrast CT of the chest was initially obtained.

[Series 10: cta thorax 2.00 bv36 s3 axial arterial · axial · arterial · 0.90mm/px · z∈[+1371,+1863]mm · 8 of 318 slices shown]
[im 36/318  lung]
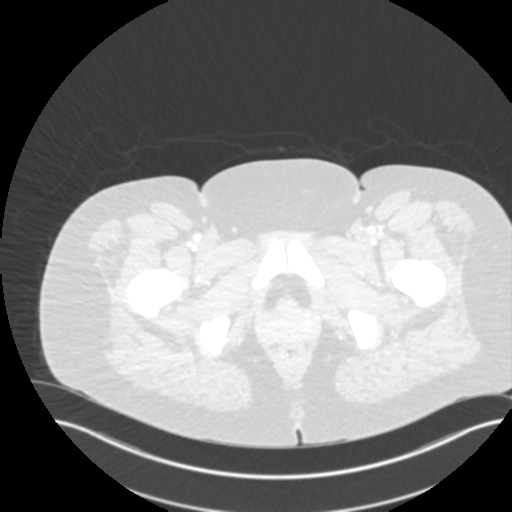
[im 71/318  mediastinal]
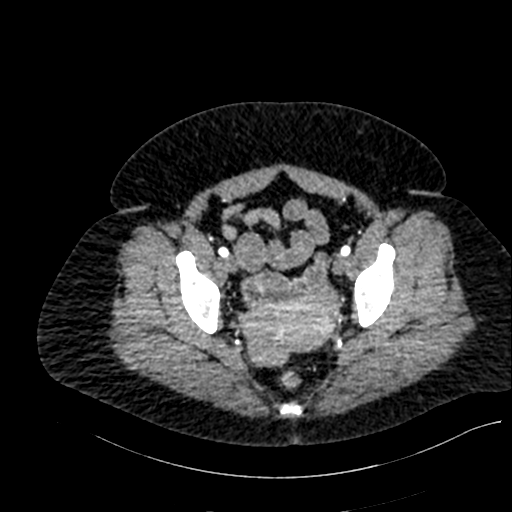
[im 106/318  lung]
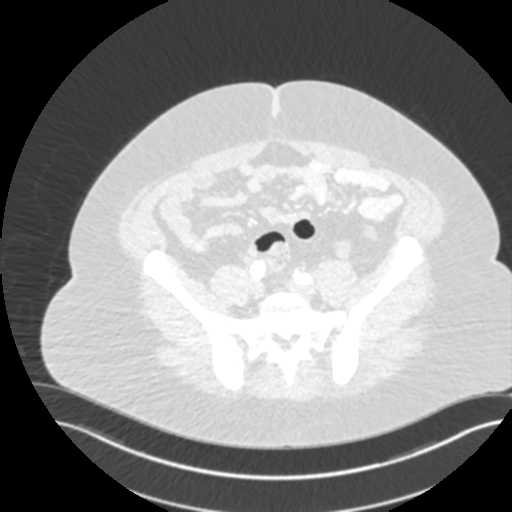
[im 141/318  mediastinal]
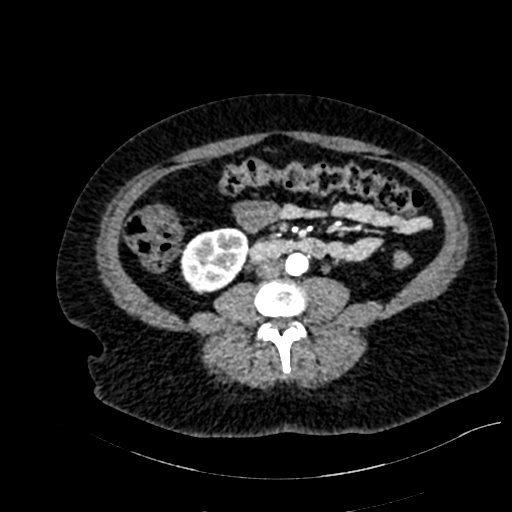
[im 177/318  lung]
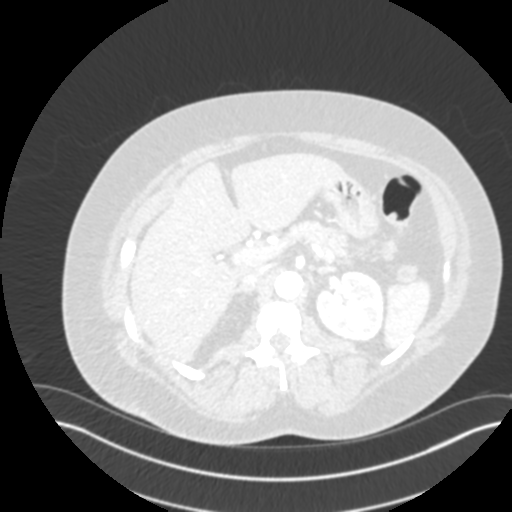
[im 212/318  mediastinal]
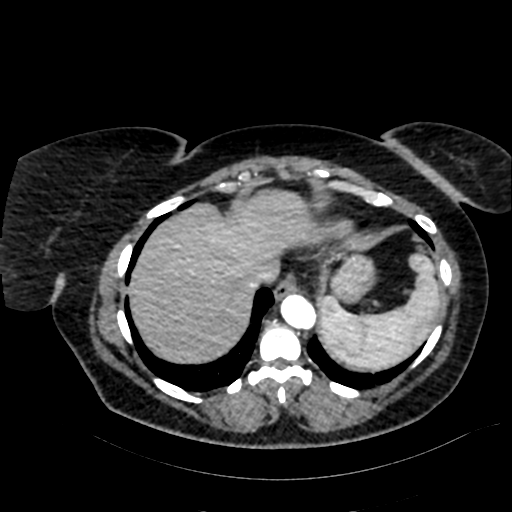
[im 247/318  lung]
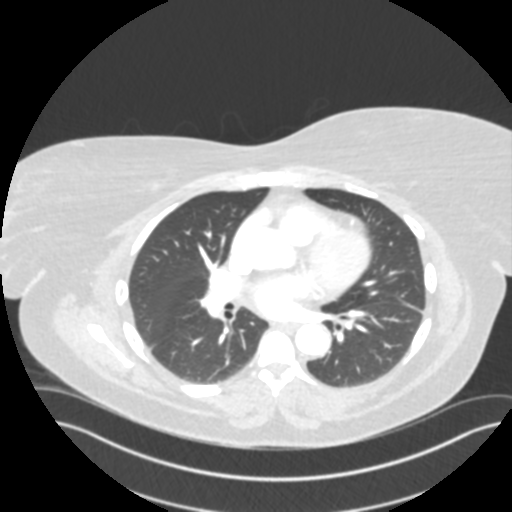
[im 282/318  mediastinal]
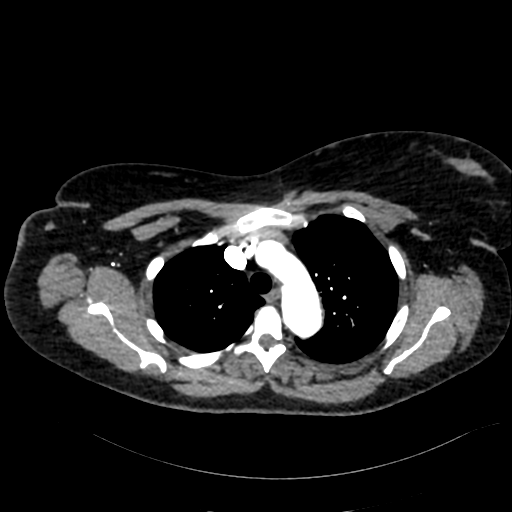

[Series 12: cta thorax 2.00 br60 s3 axial arterial · axial · arterial · 0.90mm/px · z∈[+1371,+1581]mm · 4 of 318 slices shown]
[im 36/318  lung]
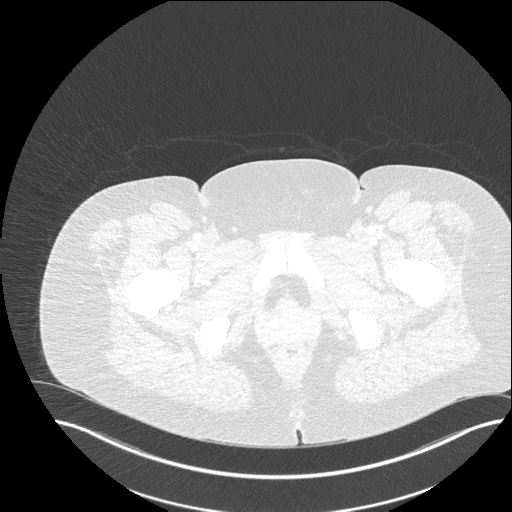
[im 71/318  lung]
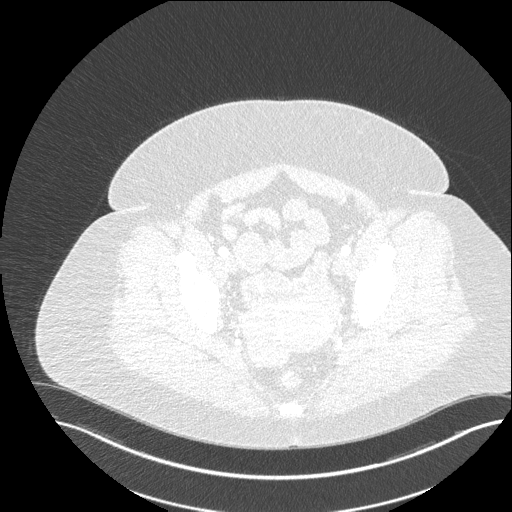
[im 106/318  lung]
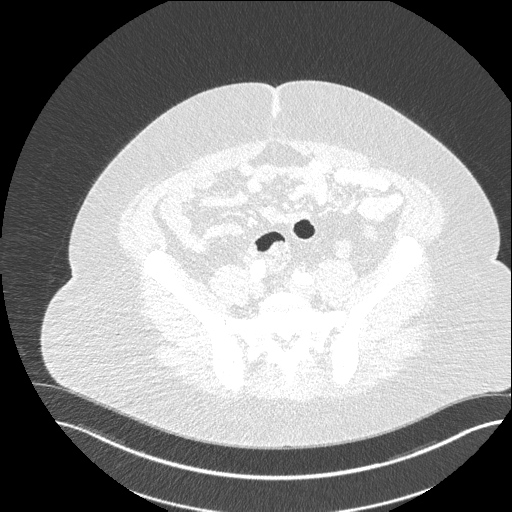
[im 141/318  lung]
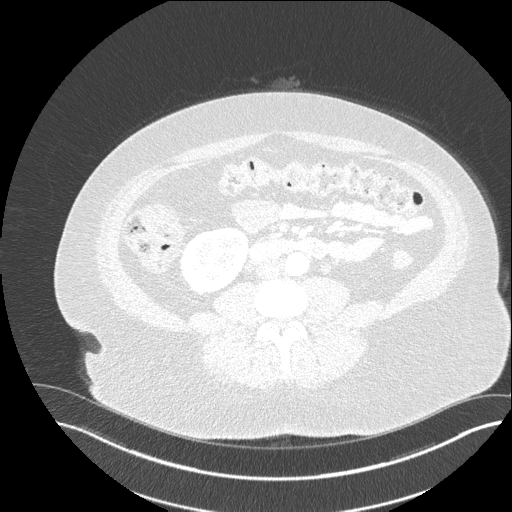

[Series 15: cta thorax 2.00 bv36 s3 cor st · coronal · 0.90mm/px · 1 of 229 slices shown]
[im 115/229  mediastinal]
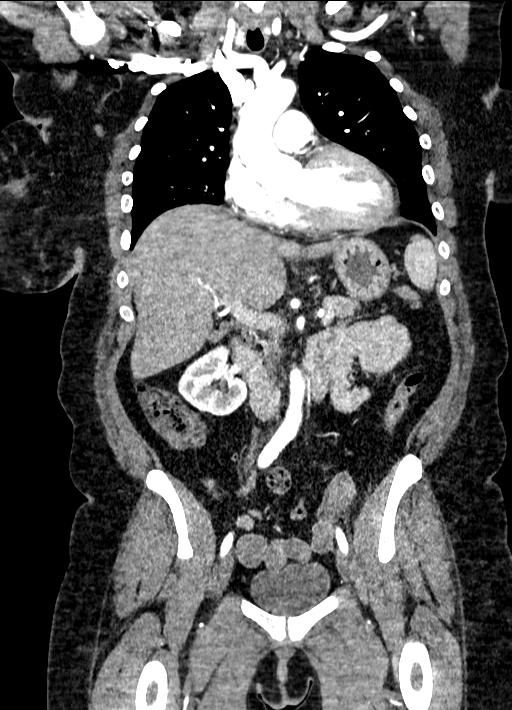

[13 of 36 positions shown; findings below may reference images not displayed]

Multidetector CT imaging through the chest, abdomen and pelvis was
performed using the standard protocol during bolus administration of
intravenous contrast. Multiplanar reconstructed images and MIPs were
obtained and reviewed to evaluate the vascular anatomy.

CONTRAST:  100mL MHLIDK-0B6 IOPAMIDOL (MHLIDK-0B6) INJECTION 76%
FINDINGS: CTA CHEST FINDINGS

Vascular Findings:

Redemonstrated type B descending thoracic aortic dissection
extending to the right common iliac arteries bifurcation. The
dissection originates just peripheral to the takeoff of the left
subclavian artery without involvement and does not appear to involve
the presumed ductus diverticulum arising from the anterior proximal
aspect of the descending thoracic aorta (image 45, series 10). Both
the true and false lumens remained patent without development of
mural thrombus. Review of the precontrast images are negative for
the presence of an intramural hematoma. No perivascular stranding.

The descending thoracic aorta remains mildly ectatic with
measurements as follows.

No evidence of thoracic aortic dissection on this nongated
examination. Bovine configuration of the aortic arch. The branch
vessels of the aortic arch appear patent throughout their imaged
courses. Again, the dissection does not appear to involve the branch
vessels of the aortic arch.

Borderline cardiomegaly.  No pericardial effusion.

Although this examination was not tailored for the evaluation the
pulmonary arteries, there are no discrete filling defects within the
central pulmonary arterial tree to suggest central pulmonary
embolism. Normal caliber of the main pulmonary artery given
pulsation artifact.

-------------------------------------------------------------

Thoracic aortic measurements:

Sinotubular junction

34 mm as measured in greatest oblique short axis coronal dimension.

Proximal ascending aorta

36 mm as measured in greatest oblique short axis axial dimension at
the level of the main pulmonary artery (image 62, series 10) and 35
mm in greatest oblique short axis coronal diameter (coronal image
114, series 15)

Aortic arch aorta

29 mm as measured in greatest oblique short axis sagittal dimension
(image 128, series 17).

Proximal descending thoracic aorta

30 mm as measured in greatest oblique short axis axial dimension at
the level of the main pulmonary artery.

Distal descending thoracic aorta

29 mm as measured in greatest oblique short axis axial dimension at
the level of the diaphragmatic hiatus.

Review of the MIP images confirms the above findings.

-------------------------------------------------------------

Non-Vascular Findings:

Mediastinum/Lymph Nodes: No mediastinal, hilar or axillary
lymphadenopathy.

Lungs/Pleura: Minimal subsegmental atelectasis involving the right
middle lobe. Minimal dependent subpleural ground-glass atelectasis.
No air bronchograms. No pleural effusion or pneumothorax.
Redemonstrated punctate (approximately 0.6 cm) cyst within the right
middle lobe (image 68, series 8).

There is a minimal amount of nonocclusive debris within the right
mainstem bronchus (image 38, series 8). The remaining central
pulmonary airways appear widely patent.

No discrete pulmonary nodules.

Musculoskeletal: No acute or aggressive osseous abnormalities.

_________________________________________________________

_________________________________________________________

CTA ABDOMEN AND PELVIS FINDINGS

VASCULAR

Aorta: The descending thoracic aortic dissection is again noted to
extend throughout the entirety of the normal caliber abdominal aorta
to the level of the bifurcation of the right common iliac artery.
Both the true and false lumens remain patent with only the
duplicated right renal arteries arising from the false lumen. No
significant mural thrombus. No perivascular stranding.

Celiac: The dissection abuts but does not extend into the celiac
artery which appears widely patent. Conventional branching pattern.

SMA: The dissection abuts but does not extend into the origin of the
SMA. Conventional branching pattern. The distal tributaries of the
SMA appear widely patent without discrete lumen filling defect to
suggest distal embolism.

Renals: There duplicated bilaterally with tiny right-sided accessory
renal artery supplying the inferior pole the right kidney arising
adjacent to the origin of the dominant right renal artery with a
tiny accessory left renal artery which supplies the inferior pole
the left kidney arising adjacent to the origin of the IMA.
Left-sided renal arteries arise from the true lumen wall right-sided
renal arteries arise from the false lumen. Both dominant renal
arteries are widely patent without hemodynamically significant
narrowing. No vessel irregularity to suggest FMD.

IMA: Arises from the false lumen. Widely patent without a
hemodynamically significant narrowing.

Inflow: The dissection extends to the bifurcation of the right
common iliac artery which appears mildly ectatic measuring 1.4 cm in
diameter (image 211, series 10), unchanged. The left common iliac
artery is of normal caliber without hemodynamically significant
narrowing

The bilateral external and internal iliac arteries are normal
caliber and widely patent without hemodynamically significant
narrowing.

Veins: The IVC and pelvic venous systems appear patent on this
arterial phase examination.

Review of the MIP images confirms the above findings.

_________________________________________________________

NON-VASCULAR

Evaluation of the abdominal organs is limited to the arterial phase
of enhancement.

Hepatobiliary: Normal hepatic contour. There are no discrete
hyperenhancing hepatic lesions. Normal appearance of the gallbladder
given underdistention. No radiopaque gallstones. No intra or
extrahepatic biliary duct dilatation. No ascites.

Pancreas: Previously question peripancreatic stranding has resolved
in the interval. Normal appearance of the pancreas on this non
pancreatic protocol CT.

Spleen: Normal appearance of the spleen.

Adrenals/Urinary Tract: There is symmetric enhancement of the
bilateral kidneys. Again, the solitary left kidney is noted to arise
from the true lumen while both right-sided renal arteries arise from
the false lumen. No definite renal stones on this postcontrast
examination. No discrete renal lesions. No urinary obstruction or
perinephric stranding.

Normal appearance of the urinary bladder given degree of distention.

Stomach/Bowel: Moderate colonic stool burden without evidence of
enteric obstruction. Normal appearance of the terminal ileum and
retrocecal appendix. No discrete areas of bowel wall thickening. No
significant hiatal hernia. No pneumoperitoneum, pneumatosis or
portal venous gas.

Lymphatic: No bulky retroperitoneal, mesenteric, pelvic or inguinal
lymphadenopathy.

Reproductive: Suspected myomatous uterus with an approximately 3.6 x
2.5 cm fibroid arising from the right posterolateral aspect of the
uterus (image 250, series 10).

Other: Tiny mesenteric fat containing periumbilical hernia. There is
a minimal amount of subcutaneous edema about the midline of the low
back.

Musculoskeletal: No acute or aggressive osseous abnormalities.

Review of the MIP images confirms the above findings.
IMPRESSION: 1. Grossly unchanged appearance of known type B descending thoracic
aortic dissection extending throughout the normal caliber abdominal
aorta to the level of the bifurcation of the right common iliac
artery. The dissection does not involve the branch vessels of the
aortic arch or result in a hemodynamically significant narrowing of
any of the mesenteric branches.
2. Both duplicated right renal arteries arise from the false lumen.
The remainder of the mesenteric branch vessels arise from the true
lumen.
3. No evidence of ascending thoracic aortic aneurysm or dissection
on this nongated examination.

## 2021-04-26 MED ORDER — IOHEXOL 350 MG/ML SOLN
100.0000 mL | Freq: Once | INTRAVENOUS | Status: AC | PRN
  Administered 2021-04-26: 100 mL via INTRAVENOUS

## 2021-04-26 NOTE — Progress Notes (Deleted)
Patient referred by Trey Sailors, PA for hypertension, h/o type B dissection  Subjective:   Robin Guerrero, female    DOB: Dec 26, 1985, 35 y.o.   MRN: [id number]   No chief complaint on file.    HPI  35 y.o. African American female with hypertension, h/o medically treated type B aortic dissection, prior h/o EtOH, tobacco abuse.  Patient was hospitalized in 10/2020 with complaints of nausea, abdominal pain, and was found to have large type B acute aortic dissection.  Over the next 10 days, patient was managed medically, with control of hypertension. Patient's blood pressure is now well controlled on multiple medications.  Patient endorses noncompliance with medications prior to her dissection.  She has history of tobacco and alcohol abuse, but is trying to stay clean since then.  Patient reports that she still has occasional shoulder and back pains, but they are no different compared to before and have never completely gone away since her hospitalization. Subsequently, patient has undergone follow-up CT scan that showed grossly unchanged type B dissection, details below.  On a separate note, patient reports episodes of numbness in both her arms, worse with certain movement.  She also reports having pain in both her hands, especially while at work, where she works on [employer].    Past Medical History:  Diagnosis Date  . Descending thoracic dissection ([place])   . History of anemia    no current med.  . Hypertension    states under control with med., has been on med. x 3 yr.     Past Surgical History:  Procedure Laterality Date  . FOREIGN BODY REMOVAL Left 02/10/2015   Procedure: ATTEMPTED EXPLANTATION OF BIRTH CONTROL DEVICE LEFT ARM;  Surgeon: Johnathan Hausen, MD;  Location: [facility];  Service: General;  Laterality: Left;  . FOREIGN BODY REMOVAL Left 05/12/2015   Procedure: REMOVAL OF LEFT ARM BIRTH CONTROL DEVICE;  Surgeon: Johnathan Hausen, MD;  Location:  [facility];  Service: General;  Laterality: Left;  . SUBMANDIBULAR GLAND EXCISION Right 05/01/2009     Social History   Tobacco Use  Smoking Status Current Some Day Smoker  . Packs/day: 0.25  . Years: 5.00  . Pack years: 1.25  . Types: Cigarettes  Smokeless Tobacco Never Used  Tobacco Comment   1 pack/week    Social History   Substance and Sexual Activity  Alcohol Use Yes   Comment: occasionally     Family History  Problem Relation Age of Onset  . Breast cancer Mother 47  . Heart disease Mother   . Hypertension Mother      Current Outpatient Medications on File Prior to Visit  Medication Sig Dispense Refill  . acetaminophen (TYLENOL) 325 MG tablet Take 650 mg by mouth every 6 (six) hours as needed for mild pain, fever or headache.    Marland Kitchen amLODipine (NORVASC) 10 MG tablet Take 1 tablet (10 mg total) by mouth daily. 30 tablet 0  . carvedilol (COREG) 25 MG tablet Take 1 tablet (25 mg total) by mouth 2 (two) times daily with a meal. 60 tablet 0  . cloNIDine (CATAPRES) 0.1 MG tablet Take 1 tablet (0.1 mg total) by mouth 3 (three) times daily. 90 tablet 0  . dicyclomine (BENTYL) 20 MG tablet Take 1 tablet (20 mg total) by mouth 2 (two) times daily. 20 tablet 0  . docusate sodium (COLACE) 100 MG capsule Take 1 capsule (100 mg total) by mouth daily as needed. (Patient not taking:  Reported on 02/26/2021) 30 capsule 0  . ferrous sulfate 325 (65 FE) MG tablet Take 1 tablet (325 mg total) by mouth daily. 30 tablet 0  . folic acid (FOLVITE) 1 MG tablet Take 1 tablet (1 mg total) by mouth daily. 30 tablet 0  . hydrALAZINE (APRESOLINE) 50 MG tablet TAKE 1 AND 1/2 TABLETS (75 MG TOTAL) BY MOUTH EVERY EIGHT HOURS. 135 tablet 0  . losartan (COZAAR) 100 MG tablet Take 1 tablet (100 mg total) by mouth daily. 30 tablet 0  . megestrol (MEGACE) 40 MG tablet Take 1 tablet (40 mg total) by mouth 2 (two) times daily. Can increase to two tablets twice a day in the event of heavy  bleeding (Patient not taking: Reported on 02/26/2021) 60 tablet 5  . Multiple Vitamin (MULTIVITAMIN WITH MINERALS) TABS tablet Take 1 tablet by mouth daily. 30 tablet 0  . Multiple Vitamins-Minerals (CERTAVITE/ANTIOXIDANTS) TABS TAKE 1 TABLET BY MOUTH DAILY 30 tablet 0  . nicotine (NICODERM CQ - DOSED IN MG/24 HOURS) 14 mg/24hr patch Place 1 patch (14 mg total) onto the skin daily. 28 patch 0  . ondansetron (ZOFRAN ODT) 4 MG disintegrating tablet Take 1 tablet (4 mg total) by mouth every 8 (eight) hours as needed for nausea. 10 tablet 0  . prazosin (MINIPRESS) 2 MG capsule Take 1 capsule (2 mg total) by mouth at bedtime. 30 capsule 0  . QUEtiapine (SEROQUEL) 25 MG tablet TAKE 1 TABLET (25 MG TOTAL) BY MOUTH TWO TIMES DAILY. 60 tablet 0  . thiamine 100 MG tablet TAKE 1 TABLET (100 MG TOTAL) BY MOUTH DAILY. 30 tablet 0  . [DISCONTINUED] hydrochlorothiazide (HYDRODIURIL) 50 MG tablet Take 1 tablet (50 mg total) by mouth daily. 30 tablet 0   No current facility-administered medications on file prior to visit.    Cardiovascular and other pertinent studies:  EKG 02/12/2021: Sinus tachycardia 103 bpm Poor R wave progression Nonspecific T-abnormality  CT angiogram 12/11/2020: 1. Grossly unchanged appearance of known type B descending thoracic aortic dissection extending throughout the normal caliber abdominal aorta to the level of the bifurcation of the right common iliac artery. The dissection does not involve the branch vessels of the aortic arch or result in a hemodynamically significant narrowing of any of the mesenteric branches. 2. 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 The remainder of the mesenteric branch vessels arise from the true lumen. 3. No evidence of ascending thoracic aortic aneurysm or dissection on this nongated examination.  Recent labs: 12/29/2020: Glucose 116, BUN/Cr 18/1.26. EGFR 64. Na/K 142/3.7. Rest of the CMP normal H/H 8.7/28.7. MCV 89.  Platelets 571   Review of Systems  Cardiovascular: Negative for chest pain, dyspnea on exertion, leg swelling, palpitations and syncope.  Musculoskeletal: Positive for back pain and stiffness.  Neurological: Positive for paresthesias.        *** There were no vitals filed for this visit.  *** There is no height or weight on file to calculate BMI. There were no vitals filed for this visit.   Objective:   Physical Exam Vitals and nursing note reviewed.  Constitutional:      General: She is not in acute distress. Neck:     Vascular: No JVD.  Cardiovascular:     Rate and Rhythm: Normal rate and regular rhythm.     Pulses: Normal pulses.     Heart sounds: Normal heart sounds. No murmur heard.   Pulmonary:     Effort: Pulmonary effort is normal.  Breath sounds: Normal breath sounds. No wheezing or rales.  Musculoskeletal:     Right lower leg: No edema.     Left lower leg: No edema.          Assessment & Recommendations:   35 y.o. African American female with hypertension, h/o medically treated type B aortic dissection, prior h/o EtOH, tobacco abuse.  *** Hypertension: Controlled.  No change made today to current antihypertensive therapy. It is rather difficult to wean her off her medications without risking acute increase in blood pressure. Will check echocardiogram.   *** Type B aortic dissection: Acute episode in November 2021, with grossly unchanged CT scan thereafter. No known family h/o aortic syndrome. Patient has two kids. I will refer her to Dr. Lattie Corns for genetic counseling.  Continue follow-up with vascular surgery.  *** Paresthesias: I suspect she may have neuropathy in her bilateral upper extremities, either related to prior use of excess alcohol, or related to her work ([employer]). Consider neurology referral. Defer to PCP.   Thank you for referring the patient to [place]. Please feel free to contact with any questions.   Nigel Mormon,  MD Pager: [phone] Office: [phone]

## 2021-04-26 NOTE — ED Triage Notes (Signed)
Pt presents with c/o abdominal pain that started several day ago and chest pain that started yesterday, hx of aortic dissection.

## 2021-04-26 NOTE — ED Provider Notes (Signed)
[facility] EMERGENCY DEPARTMENT Provider Note  CSN: [id number] Arrival date & time: 04/26/21 1040    History Chief Complaint  Patient presents with  . Abdominal Pain  . Chest Pain    HPI  Robin Guerrero is a 35 y.o. 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 She had been doing well, keeping her BP under control. 2 days ago, she felt a pop in her shoulder while doing her hair which was similar to sensations she had with her dissection. She had some mild lower abdominal pain that radiated up into her chest, felt like heart burn and squeezing and worse when lying on her left side. She is concerned that her pancreatitis is flared up or that her dissection has gotten worse. Currently she is pain free.    Past Medical History:  Diagnosis Date  . Descending thoracic dissection ([place])   . History of anemia    no current med.  . Hypertension    states under control with med., has been on med. x 3 yr.    Past Surgical History:  Procedure Laterality Date  . FOREIGN BODY REMOVAL Left 02/10/2015   Procedure: ATTEMPTED EXPLANTATION OF BIRTH CONTROL DEVICE LEFT ARM;  Surgeon: Johnathan Hausen, MD;  Location: [facility];  Service: General;  Laterality: Left;  . FOREIGN BODY REMOVAL Left 05/12/2015   Procedure: REMOVAL OF LEFT ARM BIRTH CONTROL DEVICE;  Surgeon: Johnathan Hausen, MD;  Location: [facility];  Service: General;  Laterality: Left;  . SUBMANDIBULAR GLAND EXCISION Right 05/01/2009    Family History  Problem Relation Age of Onset  . Breast cancer Mother 44  . Heart disease Mother   . Hypertension Mother     Social History   Tobacco Use  . Smoking status: Current Some Day Smoker    Packs/day: 0.25    Years: 5.00    Pack years: 1.25    Types: Cigarettes  . Smokeless tobacco: Never Used  . Tobacco comment: 1 pack/week  Vaping Use  . Vaping Use: Never used   Substance Use Topics  . Alcohol use: Yes    Comment: occasionally  . Drug use: No     Home Medications Prior to Admission medications   Medication Sig Start Date End Date Taking? Authorizing Provider  acetaminophen (TYLENOL) 325 MG tablet Take 650 mg by mouth every 6 (six) hours as needed for mild pain, fever or headache.    [provider]  amLODipine (NORVASC) 10 MG tablet Take 1 tablet (10 mg total) by mouth daily. 12/18/20   Ulyses Amor, PA-C  amLODipine (NORVASC) 5 MG tablet Take 5 mg by mouth daily. 04/20/21   [provider]  carvedilol (COREG) 25 MG tablet Take 1 tablet (25 mg total) by mouth 2 (two) times daily with a meal. 12/18/20   Theda Sers, Susette Racer, PA-C  cloNIDine (CATAPRES) 0.1 MG tablet Take 1 tablet (0.1 mg total) by mouth 3 (three) times daily. 12/18/20   Ulyses Amor, PA-C  dicyclomine (BENTYL) 20 MG tablet Take 1 tablet (20 mg total) by mouth 2 (two) times daily. 11/08/20   Larene Pickett, PA-C  docusate sodium (COLACE) 100 MG capsule Take 1 capsule (100 mg total) by mouth daily as needed. Patient not taking: Reported on 02/26/2021 08/27/20 08/27/21  Lavina Hamman, MD  ferrous sulfate 325 (65 FE) MG tablet Take 1 tablet (325 mg total) by mouth  daily. 05/30/20   Henderly, Britni A, PA-C  folic acid (FOLVITE) 1 MG tablet Take 1 tablet (1 mg total) by mouth daily. 08/28/20   Lavina Hamman, MD  hydrALAZINE (APRESOLINE) 50 MG tablet TAKE 1 AND 1/2 TABLETS (75 MG TOTAL) BY MOUTH EVERY EIGHT HOURS. 11/17/20 11/17/21  Allie Bossier, MD  hydrochlorothiazide (HYDRODIURIL) 25 MG tablet Take 25 mg by mouth daily. 02/08/21   [provider]  losartan (COZAAR) 100 MG tablet Take 1 tablet (100 mg total) by mouth daily. 12/18/20   Ulyses Amor, PA-C  megestrol (MEGACE) 40 MG tablet Take 1 tablet (40 mg total) by mouth 2 (two) times daily. Can increase to two tablets twice a day in the event of heavy bleeding Patient not taking: Reported on 02/26/2021 11/18/20    Allie Bossier, MD  Multiple Vitamin (MULTIVITAMIN WITH MINERALS) TABS tablet Take 1 tablet by mouth daily. 11/18/20   Allie Bossier, MD  Multiple Vitamins-Minerals (CERTAVITE/ANTIOXIDANTS) TABS TAKE 1 TABLET BY MOUTH DAILY 11/17/20 11/17/21  Allie Bossier, MD  nicotine (NICODERM CQ - DOSED IN MG/24 HOURS) 14 mg/24hr patch Place 1 patch (14 mg total) onto the skin daily. 08/28/20   Lavina Hamman, MD  ondansetron (ZOFRAN ODT) 4 MG disintegrating tablet Take 1 tablet (4 mg total) by mouth every 8 (eight) hours as needed for nausea. 11/08/20   Larene Pickett, PA-C  prazosin (MINIPRESS) 2 MG capsule Take 1 capsule (2 mg total) by mouth at bedtime. 12/18/20   Ulyses Amor, PA-C  QUEtiapine (SEROQUEL) 25 MG tablet TAKE 1 TABLET (25 MG TOTAL) BY MOUTH TWO TIMES DAILY. 11/17/20 11/17/21  Allie Bossier, MD  thiamine 100 MG tablet TAKE 1 TABLET (100 MG TOTAL) BY MOUTH DAILY. 11/17/20 11/17/21  Allie Bossier, MD     Allergies    Ativan [lorazepam]   Review of Systems   Review of Systems A comprehensive review of systems was completed and negative except as noted in HPI.    Physical Exam BP 110/67   Pulse 88   Temp 98.6 F (37 C) (Oral)   Resp 20   LMP  (LMP Unknown)   SpO2 95%   Physical Exam Vitals and nursing note reviewed.  Constitutional:      Appearance: Normal appearance.  HENT:     Head: Normocephalic and atraumatic.     Nose: Nose normal.     Mouth/Throat:     Mouth: Mucous membranes are moist.  Eyes:     Extraocular Movements: Extraocular movements intact.     Conjunctiva/sclera: Conjunctivae normal.  Cardiovascular:     Rate and Rhythm: Normal rate.  Pulmonary:     Effort: Pulmonary effort is normal.     Breath sounds: Normal breath sounds.  Abdominal:     General: Abdomen is flat.     Palpations: Abdomen is soft.     Tenderness: There is no abdominal tenderness.  Musculoskeletal:        General: No swelling. Normal range of motion.     Cervical back: Neck  supple.  Skin:    General: Skin is warm and dry.  Neurological:     General: No focal deficit present.     Mental Status: She is alert.  Psychiatric:        Mood and Affect: Mood normal.      ED Results / Procedures / Treatments   Labs (all labs ordered are listed, but only abnormal results are displayed) Labs  Reviewed  CBC - Abnormal; Notable for the following components:      Result Value   RBC 3.31 (*)    Hemoglobin 11.0 (*)    HCT 33.0 (*)    All other components within normal limits  COMPREHENSIVE METABOLIC PANEL - Abnormal; Notable for the following components:   Potassium 3.1 (*)    Glucose, Bld 195 (*)    Creatinine, Ser 1.51 (*)    Calcium 8.6 (*)    Total Bilirubin 1.7 (*)    GFR, Estimated 46 (*)    All other components within normal limits  LIPASE, BLOOD - Abnormal; Notable for the following components:   Lipase 72 (*)    All other components within normal limits  I-STAT BETA HCG BLOOD, ED ([facility], [facility], [facility] ONLY)  TROPONIN I (HIGH SENSITIVITY)  TROPONIN I (HIGH SENSITIVITY)    EKG EKG Interpretation  Date/Time:  Thursday Apr 26 2021 10:55:42 EDT Ventricular Rate:  106 PR Interval:  152 QRS Duration: 74 QT Interval:  390 QTC Calculation: 518 R Axis:   -1 Text Interpretation: Sinus tachycardia Low voltage QRS Cannot rule out Anterior infarct , age undetermined Abnormal ECG No significant change since last tracing Confirmed by Calvert Cantor [phone]) on 04/26/2021 10:58:42 AM   Radiology DG Chest 2 View  Result Date: 04/26/2021 CLINICAL DATA:  Chest pain EXAM: CHEST - 2 VIEW COMPARISON:  November 10, 2020; CT chest December 11, 2020 FINDINGS: There is mild bibasilar atelectasis. No edema or airspace opacity. Heart size and pulmonary vascularity are normal. No adenopathy. No pneumothorax. No bone lesions. IMPRESSION: Mild bibasilar atelectasis. No edema or airspace opacity. Heart size within normal limits. Electronically Signed   By: Lowella Grip III M.D.    On: 04/26/2021 11:24   CT Angio Chest/Abd/Pel for Dissection W and/or W/WO  Result Date: 04/26/2021 CLINICAL DATA:  Abdominal pain.  History of aortic dissection. EXAM: CT ANGIOGRAPHY CHEST, ABDOMEN AND PELVIS TECHNIQUE: Non-contrast CT of the chest was initially obtained. Multidetector CT imaging through the chest, abdomen and pelvis was performed using the standard protocol during bolus administration of intravenous contrast. Multiplanar reconstructed images and MIPs were obtained and reviewed to evaluate the vascular anatomy. CONTRAST:  174mL OMNIPAQUE IOHEXOL 350 MG/ML SOLN COMPARISON:  12/11/2020 FINDINGS: CTA CHEST FINDINGS Cardiovascular: Again seen is the type B aortic dissection beginning just beyond the origin of the left subclavian artery. Maximum diameter of the proximal descending thoracic aorta 4.2 cm, increasing from 3.7 cm previously. Both true and false lumen remains patent. No dissection in the ascending thoracic aorta. Heart is normal size. Mediastinum/Nodes: No mediastinal, hilar, or axillary adenopathy. Trachea and esophagus are unremarkable. Thyroid unremarkable. Lungs/Pleura: Linear scarring in the lung bases. No effusions or acute confluent airspace opacity. Musculoskeletal: Chest wall soft tissues are unremarkable. No acute bony abnormality. Review of the MIP images confirms the above findings. CTA ABDOMEN AND PELVIS FINDINGS VASCULAR Aorta: The dissection continues throughout the abdominal aorta which is normal caliber. Both true and false lumens remain patent. Celiac: Patent, arises from the true lumen. SMA: Patent, arises from the true lumen. Renals: Duplicated right renal arteries, both arising from the false lumen. Single left renal artery arising from the true lumen. Patent. IMA: Patent, arising from the true lumen. Inflow: Dissection continues into the right common iliac artery, terminating near the bifurcation of the common iliac artery. No aneurysm. Veins: No obvious venous  abnormality within the limitations of this arterial phase study. Review of the MIP images confirms the  above findings. NON-VASCULAR Hepatobiliary: Diffuse low-density throughout the liver compatible with fatty infiltration. No focal abnormality. Gallbladder unremarkable. Pancreas: No focal abnormality or ductal dilatation. Spleen: No focal abnormality.  Normal size. Adrenals/Urinary Tract: No adrenal abnormality. No focal renal abnormality. No stones or hydronephrosis. Urinary bladder is unremarkable. Stomach/Bowel: Stomach, large and small bowel grossly unremarkable. Lymphatic: No adenopathy Reproductive: Fibroid uterus again noted, unchanged. No adnexal mass. Other: No free fluid or free air. Musculoskeletal: No acute bony abnormality. Review of the MIP images confirms the above findings. IMPRESSION: Stable type B aortic dissection beginning just beyond the origin of the left subclavian artery and continuing into the right common iliac artery. 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 Bibasilar scarring. No acute findings in the abdomen or pelvis. Fibroid uterus. Electronically Signed   By: Rolm Baptise M.D.   On: 04/26/2021 12:59    Procedures Procedures  Medications Ordered in the ED Medications  iohexol (OMNIPAQUE) 350 MG/ML injection 100 mL (100 mLs Intravenous Contrast Given 04/26/21 1219)     MDM Rules/Calculators/A&P MDM Patient with history of aortic dissection, here with back, abdomin and chest pains. Currently pain free but will check her labs and send her for CTA to rule out complications.  ED Course  I have reviewed the triage vital signs and the nursing notes.  Pertinent labs & imaging results that were available during my care of the patient were reviewed by me and considered in my medical decision making (see chart for details).  Clinical Course as of 04/26/21 1349  Thu Apr 26, 2021  1154 CBC with mild anemia  improved from baseline, otherwise normal.  [CS]  1212 CMP with mildly elevated creatinine above baseline.Lipase also borderline elevated but not significantly so. HCG is neg.  [CS]  [id number] CTA shows stable dissection, mild change in size of proximal aorta, but no signs of acute process. Recommend continued outpatient Vascular follow up.  [CS]    Clinical Course User Index [CS] Truddie Hidden, MD    Final Clinical Impression(s) / ED Diagnoses Final diagnoses:  Atypical chest pain  Gastroesophageal reflux disease without esophagitis  Dissection of thoracoabdominal aorta [facility])    Rx / DC Orders ED Discharge Orders    None       Truddie Hidden, MD 04/26/21 1349

## 2021-05-01 ENCOUNTER — Other Ambulatory Visit: Payer: Medicaid Other

## 2021-05-16 ENCOUNTER — Other Ambulatory Visit: Payer: Medicaid Other

## 2021-05-16 ENCOUNTER — Ambulatory Visit: Payer: Medicaid Other | Admitting: Cardiology

## 2021-05-16 NOTE — Progress Notes (Signed)
Error

## 2021-05-17 ENCOUNTER — Encounter: Payer: Self-pay | Admitting: Cardiology

## 2021-05-29 ENCOUNTER — Ambulatory Visit: Payer: Medicaid Other | Admitting: Vascular Surgery

## 2021-06-03 ENCOUNTER — Inpatient Hospital Stay (HOSPITAL_COMMUNITY)
Admission: EM | Admit: 2021-06-03 | Discharge: 2021-06-07 | DRG: 682 | Disposition: A | Discharge status: Home / Self Care | Payer: Medicaid Other | Attending: Family Medicine | Admitting: Family Medicine

## 2021-06-03 ENCOUNTER — Other Ambulatory Visit: Payer: Self-pay

## 2021-06-03 ENCOUNTER — Encounter (HOSPITAL_COMMUNITY): Payer: Self-pay

## 2021-06-03 DIAGNOSIS — K861 Other chronic pancreatitis: Secondary | ICD-10-CM | POA: Diagnosis present

## 2021-06-03 DIAGNOSIS — I7101 Dissection of thoracic aorta: Secondary | ICD-10-CM | POA: Diagnosis present

## 2021-06-03 DIAGNOSIS — N920 Excessive and frequent menstruation with regular cycle: Secondary | ICD-10-CM | POA: Diagnosis present

## 2021-06-03 DIAGNOSIS — Z720 Tobacco use: Secondary | ICD-10-CM | POA: Diagnosis present

## 2021-06-03 DIAGNOSIS — F17210 Nicotine dependence, cigarettes, uncomplicated: Secondary | ICD-10-CM | POA: Diagnosis present

## 2021-06-03 DIAGNOSIS — I10 Essential (primary) hypertension: Secondary | ICD-10-CM | POA: Diagnosis present

## 2021-06-03 DIAGNOSIS — Z79899 Other long term (current) drug therapy: Secondary | ICD-10-CM

## 2021-06-03 DIAGNOSIS — K859 Acute pancreatitis without necrosis or infection, unspecified: Secondary | ICD-10-CM

## 2021-06-03 DIAGNOSIS — N1831 Chronic kidney disease, stage 3a: Secondary | ICD-10-CM | POA: Diagnosis present

## 2021-06-03 DIAGNOSIS — I71012 Dissection of descending thoracic aorta: Secondary | ICD-10-CM | POA: Diagnosis present

## 2021-06-03 DIAGNOSIS — D696 Thrombocytopenia, unspecified: Secondary | ICD-10-CM | POA: Diagnosis present

## 2021-06-03 DIAGNOSIS — D500 Iron deficiency anemia secondary to blood loss (chronic): Secondary | ICD-10-CM | POA: Diagnosis present

## 2021-06-03 DIAGNOSIS — Z20822 Contact with and (suspected) exposure to covid-19: Secondary | ICD-10-CM | POA: Diagnosis present

## 2021-06-03 DIAGNOSIS — I129 Hypertensive chronic kidney disease with stage 1 through stage 4 chronic kidney disease, or unspecified chronic kidney disease: Secondary | ICD-10-CM | POA: Diagnosis present

## 2021-06-03 DIAGNOSIS — F1010 Alcohol abuse, uncomplicated: Secondary | ICD-10-CM | POA: Diagnosis present

## 2021-06-03 DIAGNOSIS — Z888 Allergy status to other drugs, medicaments and biological substances status: Secondary | ICD-10-CM

## 2021-06-03 DIAGNOSIS — Z8249 Family history of ischemic heart disease and other diseases of the circulatory system: Secondary | ICD-10-CM

## 2021-06-03 DIAGNOSIS — R6510 Systemic inflammatory response syndrome (SIRS) of non-infectious origin without acute organ dysfunction: Secondary | ICD-10-CM | POA: Diagnosis present

## 2021-06-03 DIAGNOSIS — Z28311 Partially vaccinated for covid-19: Secondary | ICD-10-CM

## 2021-06-03 DIAGNOSIS — Z6836 Body mass index (BMI) 36.0-36.9, adult: Secondary | ICD-10-CM

## 2021-06-03 DIAGNOSIS — I1A0 Resistant hypertension: Secondary | ICD-10-CM | POA: Diagnosis present

## 2021-06-03 DIAGNOSIS — N179 Acute kidney failure, unspecified: Principal | ICD-10-CM | POA: Diagnosis present

## 2021-06-03 DIAGNOSIS — E876 Hypokalemia: Secondary | ICD-10-CM | POA: Diagnosis present

## 2021-06-03 DIAGNOSIS — E869 Volume depletion, unspecified: Secondary | ICD-10-CM | POA: Diagnosis present

## 2021-06-03 DIAGNOSIS — E860 Dehydration: Secondary | ICD-10-CM | POA: Diagnosis present

## 2021-06-03 DIAGNOSIS — Z803 Family history of malignant neoplasm of breast: Secondary | ICD-10-CM

## 2021-06-03 MED ORDER — SODIUM CHLORIDE 0.9 % IV BOLUS
1000.0000 mL | Freq: Once | INTRAVENOUS | Status: AC
  Administered 2021-06-04: 1000 mL via INTRAVENOUS

## 2021-06-03 MED ORDER — HYDROMORPHONE HCL 1 MG/ML IJ SOLN
0.5000 mg | Freq: Once | INTRAMUSCULAR | Status: AC
  Administered 2021-06-04: 0.5 mg via INTRAVENOUS
  Filled 2021-06-03: qty 1

## 2021-06-03 NOTE — ED Provider Notes (Signed)
[facility] Emergency Department Provider Note MRN:  [id number]  Arrival date & time: 06/04/21     Chief Complaint   Fatigue and Abdominal Pain   History of Present Illness   Robin Guerrero is a 35 y.o. year-old female with a history of pancreatitis, aortic dissection presenting to the ED with chief complaint of fatigue and abdominal pain.  Left-sided abdominal pain with radiation into the left flank and left chest, present for the past 2 or 3 days.  Attributed it to pancreatitis and limited her diet.  Continued pain is not going away.  Associated with some nausea.  Denies fever, no headache, no numbness or weakness to the arms or legs.  Review of Systems  A complete 10 system review of systems was obtained and all systems are negative except as noted in the HPI and PMH.   Patient's Health History    Past Medical History:  Diagnosis Date   Descending thoracic dissection ([place])    History of anemia    no current med.   Hypertension    states under control with med., has been on med. x 3 yr.    Past Surgical History:  Procedure Laterality Date   FOREIGN BODY REMOVAL Left 02/10/2015   Procedure: ATTEMPTED EXPLANTATION OF BIRTH CONTROL DEVICE LEFT ARM;  Surgeon: Johnathan Hausen, MD;  Location: [facility];  Service: General;  Laterality: Left;   FOREIGN BODY REMOVAL Left 05/12/2015   Procedure: REMOVAL OF LEFT ARM BIRTH CONTROL DEVICE;  Surgeon: Johnathan Hausen, MD;  Location: [facility];  Service: General;  Laterality: Left;   SUBMANDIBULAR GLAND EXCISION Right 05/01/2009    Family History  Problem Relation Age of Onset   Breast cancer Mother 51   Heart disease Mother    Hypertension Mother     Social History   Socioeconomic History   Marital status: Single    Spouse name: Not on file   Number of children: 2   Years of education: Not on file   Highest education level: Not on file  Occupational History   Not on file   Tobacco Use   Smoking status: Some Days    Packs/day: 0.25    Years: 5.00    Pack years: 1.25    Types: Cigarettes   Smokeless tobacco: Never   Tobacco comments:    1 pack/week  Vaping Use   Vaping Use: Never used  Substance and Sexual Activity   Alcohol use: Yes    Comment: occasionally   Drug use: No   Sexual activity: Yes    Birth control/protection: None  Other Topics Concern   Not on file  Social History Narrative   Not on file   Social Determinants of Health   Financial Resource Strain: Not on file  Food Insecurity: Not on file  Transportation Needs: Not on file  Physical Activity: Not on file  Stress: Not on file  Social Connections: Not on file  Intimate Partner Violence: Not on file     Physical Exam   Vitals:   06/04/21 0200 06/04/21 0255  BP: 128/87 131/81  Pulse: 91 80  Resp:  18  Temp:    SpO2: 98% 99%    CONSTITUTIONAL: Well-appearing, NAD NEURO:  Alert and oriented x 3, no focal deficits EYES:  eyes equal and reactive ENT/NECK:  no LAD, no JVD CARDIO: Regular rate, well-perfused, normal S1 and S2 PULM:  CTAB no wheezing or rhonchi GI/GU:  normal bowel sounds, non-distended,  non-tender MSK/SPINE:  No gross deformities, no edema SKIN:  no rash, atraumatic PSYCH:  Appropriate speech and behavior  *Additional and/or pertinent findings included in MDM below  Diagnostic and Interventional Summary    EKG Interpretation  Date/Time:    Ventricular Rate:    PR Interval:    QRS Duration:   QT Interval:    QTC Calculation:   R Axis:     Text Interpretation:          Labs Reviewed  COMPREHENSIVE METABOLIC PANEL - Abnormal; Notable for the following components:      Result Value   Sodium 133 (*)    Potassium 3.0 (*)    Chloride 97 (*)    CO2 21 (*)    Glucose, Bld 111 (*)    BUN 84 (*)    Creatinine, Ser 9.91 (*)    Calcium 7.7 (*)    Total Bilirubin 1.7 (*)    GFR, Estimated 5 (*)    All other components within normal limits   LIPASE, BLOOD - Abnormal; Notable for the following components:   Lipase 636 (*)    All other components within normal limits  CBC WITH DIFFERENTIAL/PLATELET - Abnormal; Notable for the following components:   RBC 2.59 (*)    Hemoglobin 9.0 (*)    HCT 25.5 (*)    MCH 34.7 (*)    RDW 16.2 (*)    Platelets 63 (*)    Abs Immature Granulocytes 0.18 (*)    All other components within normal limits  RESP PANEL BY RT-PCR (FLU A&B, COVID) [id number]  URINALYSIS, ROUTINE W REFLEX MICROSCOPIC  I-STAT BETA HCG BLOOD, ED ([facility], [facility], [facility] ONLY)  TROPONIN I (HIGH SENSITIVITY)  TROPONIN I (HIGH SENSITIVITY)    CT Angio Chest/Abd/Pel for Dissection W and/or Wo Contrast  Final Result      Medications  HYDROmorphone (DILAUDID) injection 0.5 mg (0.5 mg Intravenous Given 06/04/21 0018)  sodium chloride 0.9 % bolus 1,000 mL (0 mLs Intravenous Stopped 06/04/21 0149)  lactated ringers bolus 1,000 mL (1,000 mLs Intravenous New Bag/Given 06/04/21 0231)  iohexol (OMNIPAQUE) 350 MG/ML injection 100 mL (80 mLs Intravenous Contrast Given 06/04/21 0207)  sodium chloride (PF) 0.9 % injection (  Given by Other 06/04/21 0207)     Procedures  /  Critical Care .Critical Care  Date/Time: 06/04/2021 3:06 AM Performed by: Maudie Flakes, MD Authorized by: Maudie Flakes, MD   Critical care provider statement:    Critical care time (minutes):  45   Critical care was necessary to treat or prevent imminent or life-threatening deterioration of the following conditions: acute renal failure.   Critical care was time spent personally by me on the following activities:  Discussions with consultants, evaluation of patient's response to treatment, examination of patient, ordering and performing treatments and interventions, ordering and review of laboratory studies, ordering and review of radiographic studies, pulse oximetry, re-evaluation of patient's condition, obtaining history from patient or surrogate and review of old  charts  ED Course and Medical Decision Making  I have reviewed the triage vital signs, the nursing notes, and pertinent available records from the EMR.  Listed above are laboratory and imaging tests that I personally ordered, reviewed, and interpreted and then considered in my medical decision making (see below for details).  Considering pancreatitis, new or worsening dissection also a possibility.  Will need CT a imaging. Clinical Course as of 06/04/21 0307  Mon Jun 04, 2021  0148 Labs revealing acute renal  failure with a creatinine of 9, BUN of 84.  If anything this raises the concern for possible dissection with renal infarct.  Discussed imaging with Dr. Debbe Mounts radiology, will proceed with CTA. [MB]    Clinical Course User Index [MB] Maudie Flakes, MD     CT results discussed with Dr. Collene Gobble, no significant changes.  Discussed case with Dr. Burnett Sheng, plan is to admit to [facility] hospital service, patient may need dialysis.  Barth Kirks. Sedonia Small, [place] [email or web address]  Final Clinical Impressions(s) / ED Diagnoses     ICD-10-CM   1. Acute renal failure, unspecified acute renal failure type ([place])  N17.9       ED Discharge Orders     None        Discharge Instructions Discussed with and Provided to Patient:   Discharge Instructions   None       Maudie Flakes, MD 06/04/21 [phone]

## 2021-06-03 NOTE — ED Triage Notes (Signed)
Pt has a known aortic dissection.

## 2021-06-03 NOTE — ED Provider Notes (Signed)
Emergency Medicine Provider Triage Evaluation Note  Robin Guerrero , a 34 y.o. female  was evaluated in triage.  Pt complains of abdominal pain.  Patient reports that she has pancreatitis. She has a history of this and this feels similar.  She has not been eating since Tuesday.  She feels like this improved her pain.   She feels tired with poor appetite.   Review of Systems  Positive: Abdominal pain Negative: Fevers, chest pain  Physical Exam  BP 121/75 (BP Location: Left Arm)   Pulse 99   Temp 98.2 F (36.8 C) (Oral)   Resp 18   Ht 5\' 8"  (1.727 m)   Wt 106.6 kg   SpO2 99%   BMI 35.73 kg/m  Gen:   Awake, no distress   Resp:  Normal effort  MSK:   Moves extremities without difficulty  Other:  Speech is not slurred  Medical Decision Making  Medically screening exam initiated at 10:19 PM.  Appropriate orders placed.  Daysy Abraha was informed that the remainder of the evaluation will be completed by another provider, this initial triage assessment does not replace that evaluation, and the importance of remaining in the ED until their evaluation is complete.    Patient has a history of thoracic dissection.  Will get roomed and labs ordered.    Lorin Glass, Hershal Coria 06/03/21 2223    Wyvonnia Dusky, MD 06/04/21 1004

## 2021-06-03 NOTE — ED Triage Notes (Signed)
Pt to ED from home with c/o lethargy and LLQ pain which began on Thursday. Pt also c/o indigestion. Hx of pancreatitis, feels she is dehydrated and has no appetite. Arrives A+O, VSS, NADN.

## 2021-06-04 ENCOUNTER — Emergency Department (HOSPITAL_COMMUNITY): Payer: Medicaid Other

## 2021-06-04 ENCOUNTER — Encounter (HOSPITAL_COMMUNITY): Payer: Self-pay | Admitting: Family Medicine

## 2021-06-04 DIAGNOSIS — R109 Unspecified abdominal pain: Secondary | ICD-10-CM | POA: Diagnosis present

## 2021-06-04 DIAGNOSIS — R6510 Systemic inflammatory response syndrome (SIRS) of non-infectious origin without acute organ dysfunction: Secondary | ICD-10-CM | POA: Diagnosis present

## 2021-06-04 DIAGNOSIS — D696 Thrombocytopenia, unspecified: Secondary | ICD-10-CM | POA: Diagnosis present

## 2021-06-04 DIAGNOSIS — F1010 Alcohol abuse, uncomplicated: Secondary | ICD-10-CM | POA: Diagnosis present

## 2021-06-04 DIAGNOSIS — Z8249 Family history of ischemic heart disease and other diseases of the circulatory system: Secondary | ICD-10-CM | POA: Diagnosis not present

## 2021-06-04 DIAGNOSIS — Z888 Allergy status to other drugs, medicaments and biological substances status: Secondary | ICD-10-CM | POA: Diagnosis not present

## 2021-06-04 DIAGNOSIS — Z79899 Other long term (current) drug therapy: Secondary | ICD-10-CM | POA: Diagnosis not present

## 2021-06-04 DIAGNOSIS — N179 Acute kidney failure, unspecified: Principal | ICD-10-CM | POA: Diagnosis present

## 2021-06-04 DIAGNOSIS — E860 Dehydration: Secondary | ICD-10-CM | POA: Diagnosis present

## 2021-06-04 DIAGNOSIS — K861 Other chronic pancreatitis: Secondary | ICD-10-CM | POA: Diagnosis present

## 2021-06-04 DIAGNOSIS — N1831 Chronic kidney disease, stage 3a: Secondary | ICD-10-CM | POA: Diagnosis present

## 2021-06-04 DIAGNOSIS — F17210 Nicotine dependence, cigarettes, uncomplicated: Secondary | ICD-10-CM | POA: Diagnosis present

## 2021-06-04 DIAGNOSIS — D500 Iron deficiency anemia secondary to blood loss (chronic): Secondary | ICD-10-CM | POA: Diagnosis present

## 2021-06-04 DIAGNOSIS — I129 Hypertensive chronic kidney disease with stage 1 through stage 4 chronic kidney disease, or unspecified chronic kidney disease: Secondary | ICD-10-CM | POA: Diagnosis present

## 2021-06-04 DIAGNOSIS — Z6836 Body mass index (BMI) 36.0-36.9, adult: Secondary | ICD-10-CM | POA: Diagnosis not present

## 2021-06-04 DIAGNOSIS — K859 Acute pancreatitis without necrosis or infection, unspecified: Secondary | ICD-10-CM

## 2021-06-04 DIAGNOSIS — E876 Hypokalemia: Secondary | ICD-10-CM | POA: Diagnosis present

## 2021-06-04 DIAGNOSIS — N920 Excessive and frequent menstruation with regular cycle: Secondary | ICD-10-CM | POA: Diagnosis present

## 2021-06-04 DIAGNOSIS — E869 Volume depletion, unspecified: Secondary | ICD-10-CM | POA: Diagnosis present

## 2021-06-04 DIAGNOSIS — Z28311 Partially vaccinated for covid-19: Secondary | ICD-10-CM | POA: Diagnosis not present

## 2021-06-04 DIAGNOSIS — Z803 Family history of malignant neoplasm of breast: Secondary | ICD-10-CM | POA: Diagnosis not present

## 2021-06-04 DIAGNOSIS — Z20822 Contact with and (suspected) exposure to covid-19: Secondary | ICD-10-CM | POA: Diagnosis present

## 2021-06-04 DIAGNOSIS — I10 Essential (primary) hypertension: Secondary | ICD-10-CM | POA: Diagnosis not present

## 2021-06-04 DIAGNOSIS — I7101 Dissection of thoracic aorta: Secondary | ICD-10-CM | POA: Diagnosis present

## 2021-06-04 LAB — COMPREHENSIVE METABOLIC PANEL
ALT: 24 U/L (ref 0–44)
AST: 30 U/L (ref 15–41)
Albumin: 3.6 g/dL (ref 3.5–5.0)
Alkaline Phosphatase: 53 U/L (ref 38–126)
Anion gap: 15 (ref 5–15)
BUN: 84 mg/dL — ABNORMAL HIGH (ref 6–20)
CO2: 21 mmol/L — ABNORMAL LOW (ref 22–32)
Calcium: 7.7 mg/dL — ABNORMAL LOW (ref 8.9–10.3)
Chloride: 97 mmol/L — ABNORMAL LOW (ref 98–111)
Creatinine, Ser: 9.91 mg/dL — ABNORMAL HIGH (ref 0.44–1.00)
GFR, Estimated: 5 mL/min — ABNORMAL LOW (ref >60)
Glucose, Bld: 111 mg/dL — ABNORMAL HIGH (ref 70–99)
Potassium: 3 mmol/L — ABNORMAL LOW (ref 3.5–5.1)
Sodium: 133 mmol/L — ABNORMAL LOW (ref 135–145)
Total Bilirubin: 1.7 mg/dL — ABNORMAL HIGH (ref 0.3–1.2)
Total Protein: 8.1 g/dL (ref 6.5–8.1)

## 2021-06-04 LAB — RAPID URINE DRUG SCREEN, HOSP PERFORMED
Amphetamines: NOT DETECTED
Barbiturates: NOT DETECTED
Benzodiazepines: NOT DETECTED
Cocaine: NOT DETECTED
Opiates: NOT DETECTED
Tetrahydrocannabinol: NOT DETECTED

## 2021-06-04 LAB — I-STAT BETA HCG BLOOD, ED (MC, WL, AP ONLY): I-stat hCG, quantitative: <5 m[IU]/mL (ref <5)

## 2021-06-04 LAB — BASIC METABOLIC PANEL
Anion gap: 13 (ref 5–15)
BUN: 81 mg/dL — ABNORMAL HIGH (ref 6–20)
CO2: 20 mmol/L — ABNORMAL LOW (ref 22–32)
Calcium: 7.3 mg/dL — ABNORMAL LOW (ref 8.9–10.3)
Chloride: 99 mmol/L (ref 98–111)
Creatinine, Ser: 9.06 mg/dL — ABNORMAL HIGH (ref 0.44–1.00)
GFR, Estimated: 5 mL/min — ABNORMAL LOW (ref >60)
Glucose, Bld: 110 mg/dL — ABNORMAL HIGH (ref 70–99)
Potassium: 3.6 mmol/L (ref 3.5–5.1)
Sodium: 132 mmol/L — ABNORMAL LOW (ref 135–145)

## 2021-06-04 LAB — SODIUM, URINE, RANDOM: Sodium, Ur: 51 mmol/L

## 2021-06-04 LAB — RESP PANEL BY RT-PCR (FLU A&B, COVID) ARPGX2
Influenza A by PCR: NEGATIVE
Influenza B by PCR: NEGATIVE
SARS Coronavirus 2 by RT PCR: NEGATIVE

## 2021-06-04 LAB — CBC
HCT: 22.7 % — ABNORMAL LOW (ref 36.0–46.0)
Hemoglobin: 7.9 g/dL — ABNORMAL LOW (ref 12.0–15.0)
MCH: 34.8 pg — ABNORMAL HIGH (ref 26.0–34.0)
MCHC: 34.8 g/dL (ref 30.0–36.0)
MCV: 100 fL (ref 80.0–100.0)
Platelets: 68 10*3/uL — ABNORMAL LOW (ref 150–400)
RBC: 2.27 MIL/uL — ABNORMAL LOW (ref 3.87–5.11)
RDW: 17 % — ABNORMAL HIGH (ref 11.5–15.5)
WBC: 8.3 10*3/uL (ref 4.0–10.5)
nRBC: 0 % (ref 0.0–0.2)

## 2021-06-04 LAB — CBC WITH DIFFERENTIAL/PLATELET
Abs Immature Granulocytes: 0.18 10*3/uL — ABNORMAL HIGH (ref 0.00–0.07)
Basophils Absolute: 0 10*3/uL (ref 0.0–0.1)
Basophils Relative: 0 %
Eosinophils Absolute: 0.2 10*3/uL (ref 0.0–0.5)
Eosinophils Relative: 2 %
HCT: 25.5 % — ABNORMAL LOW (ref 36.0–46.0)
Hemoglobin: 9 g/dL — ABNORMAL LOW (ref 12.0–15.0)
Immature Granulocytes: 2 %
Lymphocytes Relative: 11 %
Lymphs Abs: 1 10*3/uL (ref 0.7–4.0)
MCH: 34.7 pg — ABNORMAL HIGH (ref 26.0–34.0)
MCHC: 35.3 g/dL (ref 30.0–36.0)
MCV: 98.5 fL (ref 80.0–100.0)
Monocytes Absolute: 0.7 10*3/uL (ref 0.1–1.0)
Monocytes Relative: 7 %
Neutro Abs: 7.6 10*3/uL (ref 1.7–7.7)
Neutrophils Relative %: 78 %
Platelets: 63 10*3/uL — ABNORMAL LOW (ref 150–400)
RBC: 2.59 MIL/uL — ABNORMAL LOW (ref 3.87–5.11)
RDW: 16.2 % — ABNORMAL HIGH (ref 11.5–15.5)
WBC: 9.7 10*3/uL (ref 4.0–10.5)
nRBC: 0.2 % (ref 0.0–0.2)

## 2021-06-04 LAB — URINALYSIS, ROUTINE W REFLEX MICROSCOPIC
Bilirubin Urine: NEGATIVE
Glucose, UA: NEGATIVE mg/dL
Ketones, ur: NEGATIVE mg/dL
Leukocytes,Ua: NEGATIVE
Nitrite: NEGATIVE
Protein, ur: 100 mg/dL — AB
Specific Gravity, Urine: 1.016 (ref 1.005–1.030)
pH: 5 (ref 5.0–8.0)

## 2021-06-04 LAB — TROPONIN I (HIGH SENSITIVITY)
Troponin I (High Sensitivity): 6 ng/L (ref <18)
Troponin I (High Sensitivity): 6 ng/L (ref <18)

## 2021-06-04 LAB — CREATININE, URINE, RANDOM: Creatinine, Urine: 171.16 mg/dL

## 2021-06-04 LAB — LIPASE, BLOOD: Lipase: 636 U/L — ABNORMAL HIGH (ref 11–51)

## 2021-06-04 MED ORDER — CARVEDILOL 25 MG PO TABS
25.0000 mg | ORAL_TABLET | Freq: Two times a day (BID) | ORAL | Status: DC
  Administered 2021-06-04 – 2021-06-07 (×6): 25 mg via ORAL
  Filled 2021-06-04 (×6): qty 1

## 2021-06-04 MED ORDER — AMLODIPINE BESYLATE 5 MG PO TABS
5.0000 mg | ORAL_TABLET | Freq: Every day | ORAL | Status: DC
  Administered 2021-06-04 – 2021-06-07 (×4): 5 mg via ORAL
  Filled 2021-06-04 (×4): qty 1

## 2021-06-04 MED ORDER — CLONIDINE HCL 0.1 MG PO TABS
0.1000 mg | ORAL_TABLET | Freq: Three times a day (TID) | ORAL | Status: DC
  Administered 2021-06-04 – 2021-06-07 (×8): 0.1 mg via ORAL
  Filled 2021-06-04 (×8): qty 1

## 2021-06-04 MED ORDER — LIP MEDEX EX OINT
TOPICAL_OINTMENT | Freq: Once | CUTANEOUS | Status: AC
  Filled 2021-06-04: qty 7

## 2021-06-04 MED ORDER — ACETAMINOPHEN 325 MG PO TABS
650.0000 mg | ORAL_TABLET | Freq: Four times a day (QID) | ORAL | Status: DC | PRN
  Administered 2021-06-04 – 2021-06-06 (×2): 650 mg via ORAL
  Filled 2021-06-04 (×2): qty 2

## 2021-06-04 MED ORDER — LACTATED RINGERS IV SOLN: INTRAVENOUS | Status: DC

## 2021-06-04 MED ORDER — HEPARIN SODIUM (PORCINE) 5000 UNIT/ML IJ SOLN
5000.0000 [IU] | Freq: Three times a day (TID) | INTRAMUSCULAR | Status: DC
  Administered 2021-06-04 – 2021-06-07 (×8): 5000 [IU] via SUBCUTANEOUS
  Filled 2021-06-04 (×8): qty 1

## 2021-06-04 MED ORDER — IOHEXOL 350 MG/ML SOLN
100.0000 mL | Freq: Once | INTRAVENOUS | Status: AC | PRN
  Administered 2021-06-04: 80 mL via INTRAVENOUS

## 2021-06-04 MED ORDER — OXYCODONE HCL 5 MG PO TABS
5.0000 mg | ORAL_TABLET | Freq: Four times a day (QID) | ORAL | Status: DC | PRN
  Administered 2021-06-04 – 2021-06-05 (×2): 5 mg via ORAL
  Filled 2021-06-04 (×2): qty 1

## 2021-06-04 MED ORDER — SODIUM CHLORIDE (PF) 0.9 % IJ SOLN
INTRAMUSCULAR | Status: AC
  Filled 2021-06-04: qty 50

## 2021-06-04 MED ORDER — FOLIC ACID 1 MG PO TABS
1.0000 mg | ORAL_TABLET | Freq: Every day | ORAL | Status: DC
  Administered 2021-06-04 – 2021-06-07 (×4): 1 mg via ORAL
  Filled 2021-06-04 (×4): qty 1

## 2021-06-04 MED ORDER — SODIUM CHLORIDE 0.9 % IV BOLUS
2500.0000 mL | Freq: Once | INTRAVENOUS | Status: AC
  Administered 2021-06-04: 2500 mL via INTRAVENOUS

## 2021-06-04 MED ORDER — LABETALOL HCL 5 MG/ML IV SOLN: 10.0000 mg | INTRAVENOUS | Status: DC | PRN

## 2021-06-04 MED ORDER — LACTATED RINGERS IV BOLUS
1000.0000 mL | Freq: Once | INTRAVENOUS | Status: AC
  Administered 2021-06-04: 1000 mL via INTRAVENOUS

## 2021-06-04 MED ORDER — PRAZOSIN HCL 2 MG PO CAPS
2.0000 mg | ORAL_CAPSULE | Freq: Every day | ORAL | Status: DC
  Administered 2021-06-04 – 2021-06-06 (×3): 2 mg via ORAL
  Filled 2021-06-04 (×4): qty 1

## 2021-06-04 MED ORDER — HYDROMORPHONE HCL 1 MG/ML IJ SOLN
0.5000 mg | INTRAMUSCULAR | Status: DC | PRN
  Administered 2021-06-04 (×2): 0.5 mg via INTRAVENOUS
  Filled 2021-06-04 (×2): qty 1

## 2021-06-04 MED ORDER — THIAMINE HCL 100 MG PO TABS
100.0000 mg | ORAL_TABLET | Freq: Every day | ORAL | Status: DC
  Administered 2021-06-04 – 2021-06-07 (×4): 100 mg via ORAL
  Filled 2021-06-04 (×4): qty 1

## 2021-06-04 MED ORDER — SODIUM CHLORIDE 0.9 % IV SOLN: INTRAVENOUS | Status: DC

## 2021-06-04 MED ORDER — ONDANSETRON HCL 4 MG/2ML IJ SOLN: 4.0000 mg | Freq: Four times a day (QID) | INTRAMUSCULAR | Status: DC | PRN

## 2021-06-04 MED ORDER — HYDRALAZINE HCL 50 MG PO TABS
75.0000 mg | ORAL_TABLET | Freq: Three times a day (TID) | ORAL | Status: DC
  Administered 2021-06-04 – 2021-06-07 (×8): 75 mg via ORAL
  Filled 2021-06-04 (×8): qty 1

## 2021-06-04 NOTE — ED Notes (Signed)
Contact [facility] for transfer [phone]. Secretary asked for RN to call back

## 2021-06-04 NOTE — H&P (Signed)
History and Physical    Robin Guerrero HYQ:[id number] DOB: 1986-06-17 DOA: 06/03/2021  PCP: Trey Sailors, PA   Patient coming from: home  Chief Complaint: Abdominal pain  HPI: Robin Guerrero is a 35 y.o. female with medical history significant for history of aortic dissection and pancreatitis, hypertension with complaint of generalized fatigue and abdominal pain.  She reports left-sided abdominal pain with radiation to her left flank and lower left lateral chest for the last 2 to 3 days.  She initially attributed to some pancreatitis and she was limiting her diet but despite this the pain intensified.  She reports she has had some nausea and a few episodes of vomiting but has not had any diarrhea.  She denies any fever, chills, headache, syncope, numbness or weakness of extremities, chest pain or shortness of breath.  She reports she has not been drinking alcohol.  Reports has been having bad indigestion for the last few weeks and was seen in the emergency room and sent home a few weeks ago.  The really improved after that visit.  She reports she has been able to urinate and has not had any dysuria no known sick contacts.  She reports she has had decreased p.o. intake for the last few days.  ED Course: In the emergency room patient is found to be in acute renal failure with a creatinine just under 10.  Her baseline is 0.8-1.5.  She has been hemodynamically stable.  ER physician discussed with nephrology at [facility] who recommended patient be [facility] as she will possibly need dialysis.  Her sodium was 133, potassium 3.0, chloride 97, bicarb 21, BUN 84, creatinine 9.91, lipase 636, bilirubin 1.7  Review of Systems:  General: Denies fever, chills, weight loss, night sweats.  Denies dizziness.  Reports decreased appetite HENT: Denies head trauma, headache, denies change in hearing, tinnitus.  Denies nasal bleeding.  Denies sore throat,  Denies difficulty  swallowing Eyes: Denies blurry vision, pain in eye, drainage.  Denies discoloration of eyes. Neck: Denies pain.  Denies swelling.  Denies pain with movement. Cardiovascular: Denies chest pain, palpitations.  Denies edema.  Denies orthopnea Respiratory: Denies shortness of breath, cough.  Denies wheezing.  Denies sputum production Gastrointestinal: Reports abdominal pain.  Reports nausea, vomiting. Denies diarrhea.  Denies melena.  Denies hematemesis. Musculoskeletal: Denies limitation of movement.  Denies deformity or swelling.  Denies pain.  Denies arthralgias or myalgias. Genitourinary: Denies pelvic pain.  Denies urinary frequency or hesitancy.  Denies dysuria.  Skin: Denies rash.  Denies petechiae, purpura, ecchymosis. Neurological: Denies headache. Denies syncope. Denies seizure activity. Denies paresthesia. Denies slurred speech, drooping face.  Denies visual change. Psychiatric: Denies depression, anxiety. Denies hallucinations.  Past Medical History:  Diagnosis Date   Descending thoracic dissection ([place])    History of anemia    no current med.   Hypertension    states under control with med., has been on med. x 3 yr.    Past Surgical History:  Procedure Laterality Date   FOREIGN BODY REMOVAL Left 02/10/2015   Procedure: ATTEMPTED EXPLANTATION OF BIRTH CONTROL DEVICE LEFT ARM;  Surgeon: Johnathan Hausen, MD;  Location: [place];  Service: General;  Laterality: Left;   FOREIGN BODY REMOVAL Left 05/12/2015   Procedure: REMOVAL OF LEFT ARM BIRTH CONTROL DEVICE;  Surgeon: Johnathan Hausen, MD;  Location: [place];  Service: General;  Laterality: Left;   SUBMANDIBULAR GLAND EXCISION Right 05/01/2009    Social History  reports  that she has been smoking cigarettes. She has a 1.25 pack-year smoking history. She has never used smokeless tobacco. She reports current alcohol use. She reports that she does not use drugs.  Allergies  Allergen Reactions   Ativan  [Lorazepam] Other (See Comments)    Disorientated, combative    Family History  Problem Relation Age of Onset   Breast cancer Mother 27   Heart disease Mother    Hypertension Mother      Prior to Admission medications   Medication Sig Start Date End Date Taking? Authorizing Provider  acetaminophen (TYLENOL) 325 MG tablet Take 650 mg by mouth every 6 (six) hours as needed for mild pain, fever or headache.   Yes [provider]  amLODipine (NORVASC) 5 MG tablet Take 5 mg by mouth daily. 04/20/21  Yes [provider]  carvedilol (COREG) 25 MG tablet Take 1 tablet (25 mg total) by mouth 2 (two) times daily with a meal. 12/18/20  Yes Ulyses Amor, PA-C  cloNIDine (CATAPRES) 0.1 MG tablet Take 1 tablet (0.1 mg total) by mouth 3 (three) times daily. 12/18/20  Yes Ulyses Amor, PA-C  ferrous sulfate 325 (65 FE) MG tablet Take 1 tablet (325 mg total) by mouth daily. 05/30/20  Yes Henderly, Britni A, PA-C  hydrALAZINE (APRESOLINE) 50 MG tablet TAKE 1 AND 1/2 TABLETS (75 MG TOTAL) BY MOUTH EVERY EIGHT HOURS. 11/17/20 11/17/21 Yes Allie Bossier, MD  hydrochlorothiazide (HYDRODIURIL) 25 MG tablet Take 25 mg by mouth daily. 02/08/21  Yes [provider]  losartan (COZAAR) 100 MG tablet Take 1 tablet (100 mg total) by mouth daily. 12/18/20  Yes Ulyses Amor, PA-C  Multiple Vitamin (MULTIVITAMIN WITH MINERALS) TABS tablet Take 1 tablet by mouth daily. 11/18/20  Yes Allie Bossier, MD  prazosin (MINIPRESS) 2 MG capsule Take 1 capsule (2 mg total) by mouth at bedtime. 12/18/20  Yes Laurence Slate M, PA-C  QUEtiapine (SEROQUEL) 25 MG tablet TAKE 1 TABLET (25 MG TOTAL) BY MOUTH TWO TIMES DAILY. 11/17/20 11/17/21 Yes Allie Bossier, MD  thiamine 100 MG tablet TAKE 1 TABLET (100 MG TOTAL) BY MOUTH DAILY. 11/17/20 11/17/21 Yes Allie Bossier, MD  dicyclomine (BENTYL) 20 MG tablet Take 1 tablet (20 mg total) by mouth 2 (two) times daily. Patient not taking: Reported on 06/04/2021 11/08/20    Larene Pickett, PA-C    Physical Exam: Vitals:   06/04/21 0255 06/04/21 0300 06/04/21 0330 06/04/21 0345  BP: 131/81 (!) 137/91 (!) 150/77   Pulse: 80 80 95 93  Resp: 18  18   Temp:      TempSrc:      SpO2: 99% 99% 100% 100%  Weight:      Height:        Constitutional: NAD, calm, comfortable Vitals:   06/04/21 0255 06/04/21 0300 06/04/21 0330 06/04/21 0345  BP: 131/81 (!) 137/91 (!) 150/77   Pulse: 80 80 95 93  Resp: 18  18   Temp:      TempSrc:      SpO2: 99% 99% 100% 100%  Weight:      Height:       General: WDWN, Alert and oriented x3.  Eyes: EOMI, PERRL, conjunctivae normal.  Sclera nonicteric HENT:  [place]/AT, external ears normal.  Nares patent without epistasis.  Mucous membranes are dry Neck: Soft, normal range of motion, supple, no masses, Trachea midline Respiratory: clear to auscultation bilaterally, no wheezing, no crackles. Normal respiratory effort. No accessory  muscle use.  Cardiovascular: Regular rate and rhythm, no murmurs / rubs / gallops. No extremity edema. 2+ pedal pulses.  Abdomen: Soft, no tenderness, nondistended, no rebound or guarding.  No masses palpated.  Bowel sounds normoactive Musculoskeletal: FROM. no cyanosis. No joint deformity upper and lower extremities. Normal muscle tone.  Skin: Warm, dry, intact no rashes, lesions, ulcers. No induration Neurologic: CN 2-12 grossly intact.  Normal speech.  Sensation intact. Strength 5/5 in all extremities.   Psychiatric: Normal judgment and insight.  Normal mood.    Labs on Admission: I have personally reviewed following labs and imaging studies  CBC: Recent Labs  Lab 06/04/21 0016  WBC 9.7  NEUTROABS 7.6  HGB 9.0*  HCT 25.5*  MCV 98.5  PLT 63*    Basic Metabolic Panel: Recent Labs  Lab 06/04/21 0016  NA 133*  K 3.0*  CL 97*  CO2 21*  GLUCOSE 111*  BUN 84*  CREATININE 9.91*  CALCIUM 7.7*    GFR: Estimated Creatinine Clearance: 10.1 mL/min (A) (by C-G formula based on SCr of 9.91  mg/dL (H)).  Liver Function Tests: Recent Labs  Lab 06/04/21 0016  AST 30  ALT 24  ALKPHOS 53  BILITOT 1.7*  PROT 8.1  ALBUMIN 3.6    Urine analysis:    Component Value Date/Time   COLORURINE COLORLESS (A) 11/09/2020 2110   APPEARANCEUR CLEAR 11/09/2020 2110   LABSPEC 1.005 11/09/2020 2110   PHURINE 6.0 11/09/2020 2110   GLUCOSEU NEGATIVE 11/09/2020 2110   HGBUR NEGATIVE 11/09/2020 2110   BILIRUBINUR NEGATIVE 11/09/2020 2110   [place] NEGATIVE 11/09/2020 2110   PROTEINUR NEGATIVE 11/09/2020 2110   UROBILINOGEN 1.0 04/13/2014 1138   NITRITE NEGATIVE 11/09/2020 2110   LEUKOCYTESUR NEGATIVE 11/09/2020 2110    Radiological Exams on Admission: CT Angio Chest/Abd/Pel for Dissection W and/or Wo Contrast  Result Date: 06/04/2021 CLINICAL DATA:  lethargy and LLQ pain which began on Thursday. Pt also c/o indigestion. Hx of pancreatitis, EXAM: CT ANGIOGRAPHY CHEST, ABDOMEN AND PELVIS TECHNIQUE: Non-contrast CT of the chest was initially obtained. Multidetector CT imaging through the chest, abdomen and pelvis was performed using the standard protocol during bolus administration of intravenous contrast. Multiplanar reconstructed images and MIPs were obtained and reviewed to evaluate the vascular anatomy. CONTRAST:  42mL OMNIPAQUE IOHEXOL 350 MG/ML SOLN COMPARISON:  CT abdomen pelvis 08/18/2020, CT angiography chest abdomen pelvis 04/26/2021, CT angiography chest abdomen pelvis 12/11/2020 FINDINGS: CTA CHEST FINDINGS Cardiovascular: Preferential opacification of the thoracic aorta. Normal caliber and no dissection of the ascending thoracic aorta. 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 There is persistent patency of both the true and false lumen. The descending thoracic aorta is stable in size measuring up to 3.6 cm on axial imaging (4:37). Normal heart size. No pericardial effusion. No central pulmonary embolus. The main  pulmonary artery measures at the upper limits of normal (3.1 cm). Mediastinum/Nodes: No enlarged mediastinal, hilar, or axillary lymph nodes. Thyroid gland, trachea, and esophagus demonstrate no significant findings. Lungs/Pleura: Bilateral lower lobe atelectasis. No focal consolidation. No pulmonary nodule. No pulmonary mass. No pleural effusion. Musculoskeletal: No chest wall abnormality. No suspicious lytic or blastic osseous lesions. No acute displaced fracture. Review of the MIP images confirms the above findings. CTA ABDOMEN AND PELVIS FINDINGS VASCULAR Aorta: Similar-appearing continuation of a thoracic aorta dissection that extends through the abdominal aorta. Normal caliber aorta without aneurysm, vasculitis or significant stenosis. Celiac: The dissection flap extends to the origin  of the celiac artery with the celiac artery originating from the true lumen. Patent without evidence of aneurysm, dissection, vasculitis or significant stenosis. SMA: The dissection flap extends to the origin of the superior mesenteric vein with the superior mesenteric artery originating from the true lumen. Patent without evidence of aneurysm, dissection, vasculitis or significant stenosis. Renals: The left renal artery originates from the true lumen. Two renal arteries are noted to arise from the false lumen. The caudal most right renal artery originates from the false lumen with the dissection flap extending to its origin. Otherwise no definite dissection of the right renal artery. The renal arteries are patent without evidence of aneurysm, dissection, vasculitis, fibromuscular dysplasia or significant stenosis. IMA: The inferior mesenteric artery originates from the true lumen. Patent without evidence of aneurysm, dissection, vasculitis or significant stenosis. Inflow: The dissection extends into the right common iliac artery in terminates at the origin of the external and internal iliacs. The left common iliac artery  originates from the true lumen. No other dissection identified. Otherwise the iliac arteries are patent without evidence of aneurysm, vasculitis or significant stenosis. Veins: No obvious venous abnormality within the limitations of this arterial phase study. Review of the MIP images confirms the above findings. NON-VASCULAR Hepatobiliary: No focal liver abnormality. No gallstones, gallbladder wall thickening, or pericholecystic fluid. No biliary dilatation. Pancreas: No focal lesion. Normal pancreatic contour. No surrounding inflammatory changes. No main pancreatic ductal dilatation. Spleen: Normal in size without focal abnormality. Adrenals/Urinary Tract: No adrenal nodule bilaterally. Bilateral kidneys enhance symmetrically. No hydronephrosis. No hydroureter. The urinary bladder is unremarkable. Stomach/Bowel: Trace free fluid and stranding along the greater curve of the stomach (9:32). Otherwise stomach is within normal limits. No evidence of bowel wall thickening or dilatation. Slight pericolonic fat stranding along the splenic flexure. Appendix appears normal. Lymphatic: No lymphadenopathy. Reproductive: Similar-appearing asymmetric right adnexa compared to left consistent with an exophytic right posterior uterine fibroid. Redemonstration of a left fundal uterine fibroid (10:93). Otherwise uterus and bilateral adnexa are unremarkable. Other: Trace free fluid along the greater curve of the stomach. Musculoskeletal: No abdominal wall hernia or abnormality. No suspicious lytic or blastic osseous lesions. No acute displaced fracture. Review of the MIP images confirms the above findings. IMPRESSION: 1. 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 No acute complication. 2. Trace free fluid and fat stranding surrounding the splenic flexure and gastric greater curvature. Given close  proximity to the tail of the pancreas, consider further evaluation with lipase levels for acute pancreatitis. 3. Uterine fibroids. These results were called by telephone at the time of interpretation on 06/04/2021 at 2:40 am to provider [facility] , who verbally acknowledged these results. Pancreas results will be called to the ordering clinician or representative by the Radiologist Assistant, and communication documented in the PACS or [employer]. Electronically Signed   By: Iven Finn M.D.   On: 06/04/2021 03:03     Assessment/Plan Principal Problem:   ARF (acute renal failure)  Ms. Balcom has an acute renal failure with a creatinine just under 10 from a baseline of 1.5.  This was discussed with nephrology at [facility] by the ER physician.  Nephrology wanted patient transferred to [facility] as she will probably need dialysis at least for short-term. IV fluid hydration with LR at 100 ml/hr  Active Problems:   Hypokalemia Potassium is low at 3.0.  Will  check magnesium level and if low will replace. Replace potassium once magnesium level is known    Primary hypertension Patient with history of hypertension.  Continue home medications and monitor blood pressure    Thrombocytopenia  Platelet count 63,000.  Check CBC in the morning    Pancreatitis Patient with elevated lipase of 636.  Pain control provided as needed.    Tobacco use Nicotine patch.  Patient advised to discontinue smoking    DVT prophylaxis: Heparin  Code Status:   Full Code  Family Communication:  Diagnosis and plan discussed with patient.  She verbalized understanding agrees with plan.  Further recommendations to follow as clinically indicated Disposition Plan:   Patient is from:  Home  Anticipated DC to:  Home  Anticipated DC date:  Anticipate 2 midnight or more stay in the hospital  Anticipated DC barriers: Discharge identified at this time  Consults called:  Nephrology  Admission status:  Inpatient  at [place] MD [facility]  How to contact the [facility] Attending or Consulting provider [place] or covering provider during after hours [place], for this patient?   Check the care team in [facility] and look for a) attending/consulting [facility] provider listed and b) the [facility] team listed Log into [email or web address] and use [place]'s universal password to access. If you do not have the password, please contact the hospital operator. Locate the [facility] provider you are looking for under [facility] and page to a number that you can be directly reached. If you still have difficulty reaching the provider, please page the [facility] (Director on Call) for the Hospitalists listed on amion for assistance.  06/04/2021, 4:28 AM

## 2021-06-04 NOTE — ED Notes (Signed)
Patient transported to CT 

## 2021-06-04 NOTE — Consult Note (Signed)
Renal Service Consult Note [facility]  Robin Guerrero 06/04/2021 Sol Blazing, MD Requesting Physician: Dr Tawanna Solo  Reason for Consult: Renal failure HPI: The patient is a 35 y.o. year-old w/ hx of severe HTN, hx anemia and hx of thoracic descending aortic dissection, acute pancreatitis, etoh abuse , tobacco use presenting to ED w/ c/o abd pain, indigestion and nausea. In ED creat 9.8. Asked to see for renal failure.   CTA of chest / abd / pelvis done in ED w/ contrast and w/o, did not show any changes from prior studies in 2021 which showed both kidneys being perfused by the true lumen except for a 2nd renal artery on the R is per the false lumen.    Pt seen in ED, main c/o's have been indigestion and nausea, loss of appetite for about a week, drinking some water, very dry mouth and feels very fatigues/ washed out.  Some abd pain. Has been eating out a lot, eating lots of fried foods, etc.  Has been taking all her BP meds and keeping them down. Not vomiting but not eating at all. No diarrhea.   PRIOR admits    Sept 2021 - admit for acute alcoholic pancreatitis w/ SIRS and thrombocytopenia, improved over 9 day in house period complicated by ileus, severe low plts and anemia and some hyperbilirubinemia. [place] home after eating well. Accelerated HTN on multiple medications. Refused ETOH rehab.      Nov 2021 - malignant HTN, ETOH abuse and tobacco use presented w/ N/V abd pain, BP's high due to vomiting. CT showed large type B aortic dissection involving the entire abd aorta and possibly through the R common fem artery. Seen by TCTS who recommended tight BP control. Seen by palliative care. 10 days in hospital.     Baseline creat is normal from 2021 creat 0.6- 0.9 Only creat this year is 04/26/21 creat = 1.5  ROS  denies CP  no joint pain   no HA  no blurry vision  no rash  no dysuria  no difficulty voiding  no change in urine color    Past Medical History  Past  Medical History:  Diagnosis Date   Descending thoracic dissection ([facility])    History of anemia    no current med.   Hypertension    states under control with med., has been on med. x 3 yr.   Past Surgical History  Past Surgical History:  Procedure Laterality Date   FOREIGN BODY REMOVAL Left 02/10/2015   Procedure: ATTEMPTED EXPLANTATION OF BIRTH CONTROL DEVICE LEFT ARM;  Surgeon: Johnathan Hausen, MD;  Location: [place];  Service: General;  Laterality: Left;   FOREIGN BODY REMOVAL Left 05/12/2015   Procedure: REMOVAL OF LEFT ARM BIRTH CONTROL DEVICE;  Surgeon: Johnathan Hausen, MD;  Location: [place];  Service: General;  Laterality: Left;   SUBMANDIBULAR GLAND EXCISION Right 05/01/2009   Family History  Family History  Problem Relation Age of Onset   Breast cancer Mother 96   Heart disease Mother    Hypertension Mother    Social History  reports that she has been smoking cigarettes. She has a 1.25 pack-year smoking history. She has never used smokeless tobacco. She reports current alcohol use. She reports that she does not use drugs. Allergies  Allergies  Allergen Reactions   Ativan [Lorazepam] Other (See Comments)    Disorientated, combative   Home medications Prior to Admission medications   Medication Sig Start Date End  Date Taking? Authorizing Provider  acetaminophen (TYLENOL) 325 MG tablet Take 650 mg by mouth every 6 (six) hours as needed for mild pain, fever or headache.   Yes [provider]  amLODipine (NORVASC) 5 MG tablet Take 5 mg by mouth daily. 04/20/21  Yes [provider]  carvedilol (COREG) 25 MG tablet Take 1 tablet (25 mg total) by mouth 2 (two) times daily with a meal. 12/18/20  Yes Ulyses Amor, PA-C  cloNIDine (CATAPRES) 0.1 MG tablet Take 1 tablet (0.1 mg total) by mouth 3 (three) times daily. 12/18/20  Yes Ulyses Amor, PA-C  ferrous sulfate 325 (65 FE) MG tablet Take 1 tablet (325 mg total) by mouth daily.  05/30/20  Yes Henderly, Britni A, PA-C  hydrALAZINE (APRESOLINE) 50 MG tablet TAKE 1 AND 1/2 TABLETS (75 MG TOTAL) BY MOUTH EVERY EIGHT HOURS. 11/17/20 11/17/21 Yes Allie Bossier, MD  hydrochlorothiazide (HYDRODIURIL) 25 MG tablet Take 25 mg by mouth daily. 02/08/21  Yes [provider]  losartan (COZAAR) 100 MG tablet Take 1 tablet (100 mg total) by mouth daily. 12/18/20  Yes Ulyses Amor, PA-C  Multiple Vitamin (MULTIVITAMIN WITH MINERALS) TABS tablet Take 1 tablet by mouth daily. 11/18/20  Yes Allie Bossier, MD  prazosin (MINIPRESS) 2 MG capsule Take 1 capsule (2 mg total) by mouth at bedtime. 12/18/20  Yes Laurence Slate M, PA-C  QUEtiapine (SEROQUEL) 25 MG tablet TAKE 1 TABLET (25 MG TOTAL) BY MOUTH TWO TIMES DAILY. 11/17/20 11/17/21 Yes Allie Bossier, MD  thiamine 100 MG tablet TAKE 1 TABLET (100 MG TOTAL) BY MOUTH DAILY. 11/17/20 11/17/21 Yes Allie Bossier, MD  dicyclomine (BENTYL) 20 MG tablet Take 1 tablet (20 mg total) by mouth 2 (two) times daily. Patient not taking: Reported on 06/04/2021 11/08/20   Larene Pickett, PA-C     Vitals:   06/04/21 0345 06/04/21 0400 06/04/21 0430 06/04/21 0500  BP:  127/89 112/79 114/80  Pulse: 93 89 87 81  Resp:    17  Temp:      TempSrc:      SpO2: 100% 100% 100% 98%  Weight:      Height:       Exam Gen alert, no distress No rash, cyanosis or gangrene Sclera anicteric, mouth is dry No jvd or bruits Chest clear bilat to bases, no rales/ wheezing RRR no MRG Abd soft ntnd no mass or ascites +bs GU normal female MS no joint effusions or deformity Ext no LE or UE edema, no wounds or ulcers Neuro is alert, Ox 3 , nf       Home meds:  - losartan 100 qd/ hctz 25 qd/ norc 5 qd/ coreg 25 bid/ clonidine 0.1 tid/ hydralazine 75 tid/ prazosin 65m qhs   - seroquel 25 bid/ MVI  - prn's/ vitamins/ supplements  BP 110- 135/ 75- 85  HR 85- 95   RR 17- 19  temp 98.6  97% on RA Labs today - Na 132  K 3.6  CO2 20  BUN 81  cr 9.06  Ca 7.3   lipase 636  LFT"s okay   Tbili 1.7  WBC 8K  Hb 9.0  plt 63k   UA hazy, prot 100, 0-5 rbc, 11-20 wbc, rare bact, small Hb   Assessment/ Plan: AKI on CKD 3A - b/l creat from May 2022 was 1.5, eGFR 47 ml/min. Creat here 9.0 in setting of significant abd pain, indigestion and anorexia. Pt is dehydrated and has been  taking losartan as usual. Suspect this is primary cause of AKI. BP's are low normal, unusual for her, prob due to vol depletion.  Will rebolus 2.5 L and change IVF to NS at 125 cc/hr. Get bid labs for now. Admit to [facility] in case dialysis needed. Will follow.  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  HTN- severe, hx malignant.  Taking all her meds. Can resume po meds here as needed for usual BP control SBP 110- 150 for now, except hold the ARB/ losartan.  Abd pain / [place] Dura - c/w pancreatitis Thrombocytopenia - hx of same w/ pancreatitis episode last year Volume - hypovolemic by hx and on exam, plan as above.       Kelly Splinter  MD 06/04/2021, 6:40 AM  Recent Labs  Lab 06/04/21 0016 06/04/21 0550  WBC 9.7 8.3  HGB 9.0* 7.9*   Recent Labs  Lab 06/04/21 0016 06/04/21 0550  K 3.0* 3.6  BUN 84* 81*  CREATININE 9.91* 9.06*  CALCIUM 7.7* 7.3*

## 2021-06-04 NOTE — ED Notes (Signed)
[facility] called for transport to [place]. 

## 2021-06-04 NOTE — Plan of Care (Signed)

## 2021-06-04 NOTE — ED Notes (Signed)
Water provided per request.  Pt ambulated to restroom with steady gait.

## 2021-06-04 NOTE — ED Notes (Signed)
Pt given warm blanket, tissues, and soda per request.

## 2021-06-04 NOTE — Progress Notes (Signed)
Brief same day note:  Patient is a for 35 year old female with history of aortic dissection, pancreatitis, hypertension, alcohol use, tobacco use who presents to the emergency department with complaints of left-sided abdominal pain with radiation to her left flank and also right flank for the last 2 to 3 days.  She also had some nausea, episodes of vomiting but denies any diarrhea.  No history of fever or chills. On presentation she was hemodynamically stable.  Creatinine was found in the range of 9.  Lipase was elevated at 636.  Potassium was 3.Her baseline creatinine is normal. Lab work also showed thrombocytopenia, liver enzymes are normal. CT angiogram of the chest/abdomen/pelvis done in the emergency department did not show any change from prior studies in 2021. AKI was thought to be secondary to severe dehydration secondary to nausea/vomiting from pancreatitis and also could be from ARB which she takes for hypertension.  Nephrology consulted and she is being  transferred to [facility] for possible need of dialysis.  At the meantime we are continuing IV fluids.,keeping her NPO for pancreatitis.   We will also check UDS. She was previously admitted on September 2021 for acute alcoholic pancreatitis and again on November 2021 with severe hypertension, at that time CT shows large type B aortic dissection involving the abdominal aorta and was seen by [facility]. Patient seen and examined the bedside in the emergency department.  During my evaluation she was hemodynamically  stable, overall comfortable.  Her abdomen pain is better than she came with. Patient is morbidly obese.  She says she still drinks alcohol but not every day.  As per her, she thinks her last drink was at Mother's Day but she did not provide accurate date.  She states she is every day smoker. Will continue current management.

## 2021-06-04 NOTE — ED Notes (Signed)
Patient is leaving her car key with security for her cousin Arline Asp to pick up

## 2021-06-05 DIAGNOSIS — N179 Acute kidney failure, unspecified: Secondary | ICD-10-CM | POA: Diagnosis present

## 2021-06-05 DIAGNOSIS — E876 Hypokalemia: Secondary | ICD-10-CM

## 2021-06-05 DIAGNOSIS — K8500 Idiopathic acute pancreatitis without necrosis or infection: Secondary | ICD-10-CM

## 2021-06-05 DIAGNOSIS — I1A0 Resistant hypertension: Secondary | ICD-10-CM | POA: Diagnosis present

## 2021-06-05 DIAGNOSIS — I10 Essential (primary) hypertension: Secondary | ICD-10-CM | POA: Diagnosis present

## 2021-06-05 DIAGNOSIS — Z720 Tobacco use: Secondary | ICD-10-CM

## 2021-06-05 DIAGNOSIS — D696 Thrombocytopenia, unspecified: Secondary | ICD-10-CM

## 2021-06-05 DIAGNOSIS — N1831 Chronic kidney disease, stage 3a: Secondary | ICD-10-CM | POA: Diagnosis present

## 2021-06-05 DIAGNOSIS — I7101 Dissection of thoracic aorta: Secondary | ICD-10-CM

## 2021-06-05 LAB — CBC WITH DIFFERENTIAL/PLATELET
Abs Immature Granulocytes: 0.13 10*3/uL — ABNORMAL HIGH (ref 0.00–0.07)
Basophils Absolute: 0 10*3/uL (ref 0.0–0.1)
Basophils Relative: 0 %
Eosinophils Absolute: 0.2 10*3/uL (ref 0.0–0.5)
Eosinophils Relative: 3 %
HCT: 20.6 % — ABNORMAL LOW (ref 36.0–46.0)
Hemoglobin: 7.2 g/dL — ABNORMAL LOW (ref 12.0–15.0)
Immature Granulocytes: 2 %
Lymphocytes Relative: 16 %
Lymphs Abs: 1 10*3/uL (ref 0.7–4.0)
MCH: 35 pg — ABNORMAL HIGH (ref 26.0–34.0)
MCHC: 35 g/dL (ref 30.0–36.0)
MCV: 100 fL (ref 80.0–100.0)
Monocytes Absolute: 0.5 10*3/uL (ref 0.1–1.0)
Monocytes Relative: 7 %
Neutro Abs: 4.7 10*3/uL (ref 1.7–7.7)
Neutrophils Relative %: 72 %
Platelets: 77 10*3/uL — ABNORMAL LOW (ref 150–400)
RBC: 2.06 MIL/uL — ABNORMAL LOW (ref 3.87–5.11)
RDW: 17.4 % — ABNORMAL HIGH (ref 11.5–15.5)
WBC: 6.4 10*3/uL (ref 4.0–10.5)
nRBC: 0 % (ref 0.0–0.2)

## 2021-06-05 LAB — COMPREHENSIVE METABOLIC PANEL
ALT: 19 U/L (ref 0–44)
AST: 23 U/L (ref 15–41)
Albumin: 2.7 g/dL — ABNORMAL LOW (ref 3.5–5.0)
Alkaline Phosphatase: 41 U/L (ref 38–126)
Anion gap: 10 (ref 5–15)
BUN: 60 mg/dL — ABNORMAL HIGH (ref 6–20)
CO2: 19 mmol/L — ABNORMAL LOW (ref 22–32)
Calcium: 7.6 mg/dL — ABNORMAL LOW (ref 8.9–10.3)
Chloride: 108 mmol/L (ref 98–111)
Creatinine, Ser: 5.41 mg/dL — ABNORMAL HIGH (ref 0.44–1.00)
GFR, Estimated: 10 mL/min — ABNORMAL LOW (ref >60)
Glucose, Bld: 112 mg/dL — ABNORMAL HIGH (ref 70–99)
Potassium: 2.9 mmol/L — ABNORMAL LOW (ref 3.5–5.1)
Sodium: 137 mmol/L (ref 135–145)
Total Bilirubin: 1.7 mg/dL — ABNORMAL HIGH (ref 0.3–1.2)
Total Protein: 6.1 g/dL — ABNORMAL LOW (ref 6.5–8.1)

## 2021-06-05 LAB — POTASSIUM: Potassium: 3.4 mmol/L — ABNORMAL LOW (ref 3.5–5.1)

## 2021-06-05 LAB — MAGNESIUM: Magnesium: 1.3 mg/dL — ABNORMAL LOW (ref 1.7–2.4)

## 2021-06-05 LAB — LIPASE, BLOOD: Lipase: 398 U/L — ABNORMAL HIGH (ref 11–51)

## 2021-06-05 MED ORDER — POTASSIUM CHLORIDE 10 MEQ/100ML IV SOLN
10.0000 meq | INTRAVENOUS | Status: AC
  Administered 2021-06-05 (×3): 10 meq via INTRAVENOUS
  Filled 2021-06-05 (×4): qty 100

## 2021-06-05 MED ORDER — POTASSIUM CHLORIDE 10 MEQ/100ML IV SOLN
10.0000 meq | INTRAVENOUS | Status: AC
  Administered 2021-06-05 (×2): 10 meq via INTRAVENOUS
  Filled 2021-06-05 (×2): qty 100

## 2021-06-05 MED ORDER — COVID-19 MRNA VAC-TRIS(PFIZER) 30 MCG/0.3ML IM SUSP
0.3000 mL | Freq: Once | INTRAMUSCULAR | Status: DC
  Filled 2021-06-05: qty 0.3

## 2021-06-05 NOTE — Progress Notes (Signed)
PROGRESS NOTE    Robin Guerrero  YQM:[id number] DOB: 1986/04/24 DOA: 06/03/2021 PCP: Trey Sailors, PA   Brief Narrative:  35 y.o. BF PMHx Descending Aortic dissection and Pancreatitis, HTN,   Complaint of generalized fatigue and abdominal pain.  She reports left-sided abdominal pain with radiation to her left flank and lower left lateral chest for the last 2 to 3 days.  She initially attributed to some pancreatitis and she was limiting her diet but despite this the pain intensified.  She reports she has had some nausea and a few episodes of vomiting but has not had any diarrhea.  She denies any fever, chills, headache, syncope, numbness or weakness of extremities, chest pain or shortness of breath.  She reports she has not been drinking alcohol.  Reports has been having bad indigestion for the last few weeks and was seen in the emergency room and sent home a few weeks ago.  The really improved after that visit.  She reports she has been able to urinate and has not had any dysuria no known sick contacts.  She reports she has had decreased p.o. intake for the last few days.   ED Course: In the emergency room patient is found to be in acute renal failure with a creatinine just under 10.  Her baseline is 0.8-1.5.  She has been hemodynamically stable.  ER physician discussed with nephrology at [facility] who recommended patient be transferred to [facility] as she will possibly need dialysis.  Her sodium was 133, potassium 3.0, chloride 97, bicarb 21, BUN 84, creatinine 9.91, lipase 636, bilirubin 1.7   Subjective: Afebrile overnight, A/O x4.  States has some postprandial pain that she describes as heartburn.   Assessment & Plan:  Covid vaccination; vaccinated 1/2, requests second vaccine: Ordered  Principal Problem:   ARF (acute renal failure) (HCC) Active Problems:   Hypokalemia   Primary hypertension   Tobacco use   Thrombocytopenia (HCC)   Descending thoracic dissection (HCC)    Pancreatitis   Acute renal failure superimposed on stage 3a chronic kidney disease ([place])   Essential hypertension  Acute on CKD stage III renal failure (baseline Cr 1.5)  Lab Results  Component Value Date   CREATININE 5.41 (H) 06/05/2021   CREATININE 9.06 (H) 06/04/2021   CREATININE 9.91 (H) 06/04/2021   CREATININE 1.51 (H) 04/26/2021   CREATININE 0.81 11/18/2020  -Patient transferred to [facility] initially, secondary to believing she would require HD.  Losartan was discontinued and patient was hydrated, with significant improvement in kidney function. -Nephrology on board -Normal saline 131ml/hr.  Continue to hydrate -Strict in and out +709.9 mL - Daily weight Filed Weights   06/03/21 2200 06/05/21 0431  Weight: 106.6 kg 106.9 kg     Hypokalemia - Potassium goal> 4 - 6/21 Potassium IV 60 mEq  -6/21 1900 repeat K/Mg  Essential HTN -Amlodipine 5 mg daily - Coreg 25 mg BID -Clonidine 0.1 mg TID -Hydralazine 75 mg TID -Prazosin 2 mg daily  Descending aortic aneurysm - SBP goal<120 if patient will tolerate  Pancreatitis -Patient with recurrent pancreatitis Lab Results  Component Value Date   LIPASE 398 (H) 06/05/2021   LIPASE 636 (H) 06/04/2021   LIPASE 72 (H) 04/26/2021   LIPASE 125 (H) 11/09/2020   LIPASE 28 11/07/2020  -Continue to hydrate aggressively - Change diet to full liquid diet  Thrombocytopenia -Most likely secondary to pancreatitis, should clear up  Lab Results  Component Value Date   PLT 77 (L) 06/05/2021  PLT 68 (L) 06/04/2021   PLT 63 (L) 06/04/2021   PLT 276 04/26/2021   PLT 361 11/18/2020  -Starting to trend up  Tobacco use  -Nicotine patch    DVT prophylaxis: Subcu heparin Code Status: Full Family Communication:  Status is: Inpatient    Dispo: The patient is from: Home              Anticipated d/c is to: Home              Anticipated d/c date is: 6/25              Patient currently unstable      Consultants:   Nephrology  Procedures/Significant Events:     I have personally reviewed and interpreted all radiology studies and my findings are as above.  VENTILATOR SETTINGS:    Cultures 6/20 SARS coronavirus negative 6/20 influenza A/B negative   Antimicrobials: Anti-infectives (From admission, onward)    None         Devices    LINES / TUBES:      Continuous Infusions:  sodium chloride Stopped (06/04/21 1801)   potassium chloride 10 mEq (06/05/21 1735)     Objective: Vitals:   06/05/21 0431 06/05/21 0540 06/05/21 0752 06/05/21 1334  BP:  122/78 (!) 128/94 120/75  Pulse:  87  94  Resp:  18  19  Temp:  99.3 F (37.4 C)  98.4 F (36.9 C)  TempSrc:  Oral  Oral  SpO2:  100%  100%  Weight: 106.9 kg     Height:        Intake/Output Summary (Last 24 hours) at 06/05/2021 1823 Last data filed at 06/05/2021 1211 Gross per 24 hour  Intake 1309.89 ml  Output 3000 ml  Net -1690.11 ml   Filed Weights   06/03/21 2200 06/05/21 0431  Weight: 106.6 kg 106.9 kg    Examination:  General: A/O x4 No acute respiratory distress Eyes: negative scleral hemorrhage, negative anisocoria, negative icterus ENT: Negative Runny nose, negative gingival bleeding, Neck:  Negative scars, masses, torticollis, lymphadenopathy, JVD Lungs: Clear to auscultation bilaterally without wheezes or crackles Cardiovascular: Regular rate and rhythm without murmur gallop or rub normal S1 and S2 Abdomen: Positive mild epigastric abdominal pain, nondistended, positive soft, bowel sounds, no rebound, no ascites, no appreciable mass Extremities: No significant cyanosis, clubbing, or edema bilateral lower extremities Skin: Negative rashes, lesions, ulcers Psychiatric:  Negative depression, negative anxiety, negative fatigue, negative mania  Central nervous system:  Cranial nerves II through XII intact, tongue/uvula midline, all extremities muscle strength 5/5, sensation intact throughout, negative  dysarthria, negative expressive aphasia, negative receptive aphasia.  .     Data Reviewed: Care during the described time interval was provided by me .  I have reviewed this patient's available data, including medical history, events of note, physical examination, and all test results as part of my evaluation.   CBC: Recent Labs  Lab 06/04/21 0016 06/04/21 0550 06/05/21 0528  WBC 9.7 8.3 6.4  NEUTROABS 7.6  --  4.7  HGB 9.0* 7.9* 7.2*  HCT 25.5* 22.7* 20.6*  MCV 98.5 100.0 100.0  PLT 63* 68* 77*   Basic Metabolic Panel: Recent Labs  Lab 06/04/21 0016 06/04/21 0550 06/05/21 0528  NA 133* 132* 137  K 3.0* 3.6 2.9*  CL 97* 99 108  CO2 21* 20* 19*  GLUCOSE 111* 110* 112*  BUN 84* 81* 60*  CREATININE 9.91* 9.06* 5.41*  CALCIUM 7.7* 7.3* 7.6*   GFR:  Estimated Creatinine Clearance: 18.6 mL/min (A) (by C-G formula based on SCr of 5.41 mg/dL (H)). Liver Function Tests: Recent Labs  Lab 06/04/21 0016 06/05/21 0528  AST 30 23  ALT 24 19  ALKPHOS 53 41  BILITOT 1.7* 1.7*  PROT 8.1 6.1*  ALBUMIN 3.6 2.7*   Recent Labs  Lab 06/04/21 0016 06/05/21 0528  LIPASE 636* 398*   No results for input(s): AMMONIA in the last 168 hours. Coagulation Profile: No results for input(s): INR, PROTIME in the last 168 hours. Cardiac Enzymes: No results for input(s): CKTOTAL, CKMB, CKMBINDEX, TROPONINI in the last 168 hours. BNP (last 3 results) No results for input(s): PROBNP in the last 8760 hours. HbA1C: No results for input(s): HGBA1C in the last 72 hours. CBG: No results for input(s): GLUCAP in the last 168 hours. Lipid Profile: No results for input(s): CHOL, HDL, LDLCALC, TRIG, CHOLHDL, LDLDIRECT in the last 72 hours. Thyroid Function Tests: No results for input(s): TSH, T4TOTAL, FREET4, T3FREE, THYROIDAB in the last 72 hours. Anemia Panel: No results for input(s): VITAMINB12, FOLATE, FERRITIN, TIBC, IRON, RETICCTPCT in the last 72 hours. Urine analysis:    Component  Value Date/Time   COLORURINE YELLOW 06/04/2021 0446   APPEARANCEUR HAZY (A) 06/04/2021 0446   LABSPEC 1.016 06/04/2021 0446   PHURINE 5.0 06/04/2021 0446   GLUCOSEU NEGATIVE 06/04/2021 0446   HGBUR SMALL (A) 06/04/2021 0446   BILIRUBINUR NEGATIVE 06/04/2021 0446   KETONESUR NEGATIVE 06/04/2021 0446   PROTEINUR 100 (A) 06/04/2021 0446   UROBILINOGEN 1.0 04/13/2014 1138   NITRITE NEGATIVE 06/04/2021 0446   LEUKOCYTESUR NEGATIVE 06/04/2021 0446   Sepsis Labs: @LABRCNTIP (procalcitonin:4,lacticidven:4)  ) Recent Results (from the past 240 hour(s))  Resp Panel by RT-PCR (Flu A&B, Covid) Nasopharyngeal Swab     Status: None   Collection Time: 06/04/21  2:47 AM   Specimen: Nasopharyngeal Swab; Nasopharyngeal(NP) swabs in vial transport medium  Result Value Ref Range Status   SARS Coronavirus 2 by RT PCR NEGATIVE NEGATIVE Final    Comment: (NOTE) SARS-CoV-2 target nucleic acids are NOT DETECTED.  The SARS-CoV-2 RNA is generally detectable in upper respiratory specimens during the acute phase of infection. The lowest concentration of SARS-CoV-2 viral copies this assay can detect is 138 copies/mL. A negative result does not preclude SARS-Cov-2 infection and should not be used as the sole basis for treatment or other patient management decisions. A negative result may occur with  improper specimen collection/handling, submission of specimen other than nasopharyngeal swab, presence of viral mutation(s) within the areas targeted by this assay, and inadequate number of viral copies(<138 copies/mL). A negative result must be combined with clinical observations, patient history, and epidemiological information. The expected result is Negative.  Fact Sheet for Patients:  EntrepreneurPulse.com.au  Fact Sheet for Healthcare Providers:  IncredibleEmployment.be  This test is no t yet approved or cleared by the [place] FDA and  has been authorized for  detection and/or diagnosis of SARS-CoV-2 by FDA under an Emergency Use Authorization (EUA). This EUA will remain  in effect (meaning this test can be used) for the duration of the COVID-19 declaration under Section 564(b)(1) of the Act, 21 U.S.C.section 360bbb-3(b)(1), unless the authorization is terminated  or revoked sooner.       Influenza A by PCR NEGATIVE NEGATIVE Final   Influenza B by PCR NEGATIVE NEGATIVE Final    Comment: (NOTE) The Xpert Xpress SARS-CoV-2/FLU/RSV plus assay is intended as an aid in the diagnosis of influenza from Nasopharyngeal swab specimens and should  not be used as a sole basis for treatment. Nasal washings and aspirates are unacceptable for Xpert Xpress SARS-CoV-2/FLU/RSV testing.  Fact Sheet for Patients: EntrepreneurPulse.com.au  Fact Sheet for Healthcare Providers: IncredibleEmployment.be  This test is not yet approved or cleared by the [place] FDA and has been authorized for detection and/or diagnosis of SARS-CoV-2 by FDA under an Emergency Use Authorization (EUA). This EUA will remain in effect (meaning this test can be used) for the duration of the COVID-19 declaration under Section 564(b)(1) of the Act, 21 U.S.C. section 360bbb-3(b)(1), unless the authorization is terminated or revoked.  Performed at [facility], [place] [address], [place], [place] [address]          Radiology Studies: CT Angio Chest/Abd/Pel for Dissection W and/or Wo Contrast  Result Date: 06/04/2021 CLINICAL DATA:  lethargy and LLQ pain which began on Thursday. Pt also c/o indigestion. Hx of pancreatitis, EXAM: CT ANGIOGRAPHY CHEST, ABDOMEN AND PELVIS TECHNIQUE: Non-contrast CT of the chest was initially obtained. Multidetector CT imaging through the chest, abdomen and pelvis was performed using the standard protocol during bolus administration of intravenous contrast. Multiplanar reconstructed images and MIPs  were obtained and reviewed to evaluate the vascular anatomy. CONTRAST:  68mL OMNIPAQUE IOHEXOL 350 MG/ML SOLN COMPARISON:  CT abdomen pelvis 08/18/2020, CT angiography chest abdomen pelvis 04/26/2021, CT angiography chest abdomen pelvis 12/11/2020 FINDINGS: CTA CHEST FINDINGS Cardiovascular: Preferential opacification of the thoracic aorta. Normal caliber and no dissection of the ascending thoracic aorta. 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 There is persistent patency of both the true and false lumen. The descending thoracic aorta is stable in size measuring up to 3.6 cm on axial imaging (4:37). Normal heart size. No pericardial effusion. No central pulmonary embolus. The main pulmonary artery measures at the upper limits of normal (3.1 cm). Mediastinum/Nodes: No enlarged mediastinal, hilar, or axillary lymph nodes. Thyroid gland, trachea, and esophagus demonstrate no significant findings. Lungs/Pleura: Bilateral lower lobe atelectasis. No focal consolidation. No pulmonary nodule. No pulmonary mass. No pleural effusion. Musculoskeletal: No chest wall abnormality. No suspicious lytic or blastic osseous lesions. No acute displaced fracture. Review of the MIP images confirms the above findings. CTA ABDOMEN AND PELVIS FINDINGS VASCULAR Aorta: Similar-appearing continuation of a thoracic aorta dissection that extends through the abdominal aorta. Normal caliber aorta without aneurysm, vasculitis or significant stenosis. Celiac: The dissection flap extends to the origin of the celiac artery with the celiac artery originating from the true lumen. Patent without evidence of aneurysm, dissection, vasculitis or significant stenosis. SMA: The dissection flap extends to the origin of the superior mesenteric vein with the superior mesenteric artery originating from the true lumen. Patent without evidence of aneurysm, dissection, vasculitis or significant  stenosis. Renals: The left renal artery originates from the true lumen. Two renal arteries are noted to arise from the false lumen. The caudal most right renal artery originates from the false lumen with the dissection flap extending to its origin. Otherwise no definite dissection of the right renal artery. The renal arteries are patent without evidence of aneurysm, dissection, vasculitis, fibromuscular dysplasia or significant stenosis. IMA: The inferior mesenteric artery originates from the true lumen. Patent without evidence of aneurysm, dissection, vasculitis or significant stenosis. Inflow: The dissection extends into the right common iliac artery in terminates at the origin of the external and internal iliacs. The left common iliac artery originates from the true lumen. No other dissection identified. Otherwise the iliac  arteries are patent without evidence of aneurysm, vasculitis or significant stenosis. Veins: No obvious venous abnormality within the limitations of this arterial phase study. Review of the MIP images confirms the above findings. NON-VASCULAR Hepatobiliary: No focal liver abnormality. No gallstones, gallbladder wall thickening, or pericholecystic fluid. No biliary dilatation. Pancreas: No focal lesion. Normal pancreatic contour. No surrounding inflammatory changes. No main pancreatic ductal dilatation. Spleen: Normal in size without focal abnormality. Adrenals/Urinary Tract: No adrenal nodule bilaterally. Bilateral kidneys enhance symmetrically. No hydronephrosis. No hydroureter. The urinary bladder is unremarkable. Stomach/Bowel: Trace free fluid and stranding along the greater curve of the stomach (9:32). Otherwise stomach is within normal limits. No evidence of bowel wall thickening or dilatation. Slight pericolonic fat stranding along the splenic flexure. Appendix appears normal. Lymphatic: No lymphadenopathy. Reproductive: Similar-appearing asymmetric right adnexa compared to left  consistent with an exophytic right posterior uterine fibroid. Redemonstration of a left fundal uterine fibroid (10:93). Otherwise uterus and bilateral adnexa are unremarkable. Other: Trace free fluid along the greater curve of the stomach. Musculoskeletal: No abdominal wall hernia or abnormality. No suspicious lytic or blastic osseous lesions. No acute displaced fracture. Review of the MIP images confirms the above findings. IMPRESSION: 1. 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 No acute complication. 2. Trace free fluid and fat stranding surrounding the splenic flexure and gastric greater curvature. Given close proximity to the tail of the pancreas, consider further evaluation with lipase levels for acute pancreatitis. 3. Uterine fibroids. These results were called by telephone at the time of interpretation on 06/04/2021 at 2:40 am to provider [facility] , who verbally acknowledged these results. Pancreas results will be called to the ordering clinician or representative by the Radiologist Assistant, and communication documented in the PACS or [employer]. Electronically Signed   By: Iven Finn M.D.   On: 06/04/2021 03:03        Scheduled Meds:  amLODipine  5 mg Oral Daily   carvedilol  25 mg Oral BID WC   cloNIDine  0.1 mg Oral TID   COVID-19 mRNA Vac-TriS ([employer])  0.3 mL Intramuscular Once   folic acid  1 mg Oral Daily   heparin  5,000 Units Subcutaneous Q8H   hydrALAZINE  75 mg Oral Q8H   prazosin  2 mg Oral QHS   thiamine  100 mg Oral Daily   Continuous Infusions:  sodium chloride Stopped (06/04/21 1801)   potassium chloride 10 mEq (06/05/21 1735)     LOS: 1 day   The patient is critically ill with multiple organ systems failure and requires high complexity decision making for assessment and support, frequent evaluation and titration  of therapies, application of advanced monitoring technologies and extensive interpretation of multiple databases. Critical Care Time devoted to patient care services described in this note  Time spent: 40 minutes     Thia Olesen, Geraldo Docker, MD [facility]   If 7PM-7AM, please contact night-coverage 06/05/2021, 6:23 PM

## 2021-06-05 NOTE — Progress Notes (Signed)
[facility] ROUNDING NOTE   Subjective:   Interval History: Is a 35 year old lady history of severe hypertension anemia history of thoracic descending aortic aneurysm dissection, acute pancreatitis alcohol abuse tobacco abuse.  She presented with acute kidney injury.  In the emergency room she underwent CT scan of chest abdomen pelvis with contrast and without.  She was taking losartan as an outpatient.  Creatinine increased to 9 mg/dL.  Appears that she was dehydrated and was resuscitated with IV fluids and normal saline 125 cc an hour.  She is doing better this morning more awake alert feeling improved.  Blood pressure 120/94 pulse 87 temperature 99.3 O2 sats 100% room air.  Urine output 1.8 L 06/04/2021  Sodium 137 potassium 3.9 chloride 108 CO2 19 BUN 60 creatinine 5.41 glucose 112 calcium 7.6 albumin 2.7 hemoglobin 7.2  Objective:  Vital signs in last 24 hours:  Temp:  [98.3 F (36.8 C)-99.3 F (37.4 C)] 99.3 F (37.4 C) (06/21 0540) Pulse Rate:  [80-94] 87 (06/21 0540) Resp:  [16-18] 18 (06/21 0540) BP: (117-146)/(78-100) 128/94 (06/21 0752) SpO2:  [96 %-100 %] 100 % (06/21 0540) Weight:  [106.9 kg] 106.9 kg (06/21 0431)  Weight change: 0.305 kg Filed Weights   06/03/21 2200 06/05/21 0431  Weight: 106.6 kg 106.9 kg    Intake/Output: I/O last 3 completed shifts: In: 4009.9 [P.O.:640; I.V.:869.9; IV Piggyback:2500] Out: 2400 [Urine:2400]   Intake/Output this shift:  Total I/O In: -  Out: 500 [Urine:500]  CVS- RRR RS- CTA ABD- BS present soft non-distended EXT- no edema   Basic Metabolic Panel: Recent Labs  Lab 06/04/21 0016 06/04/21 0550 06/05/21 0528  NA 133* 132* 137  K 3.0* 3.6 2.9*  CL 97* 99 108  CO2 21* 20* 19*  GLUCOSE 111* 110* 112*  BUN 84* 81* 60*  CREATININE 9.91* 9.06* 5.41*  CALCIUM 7.7* 7.3* 7.6*    Liver Function Tests: Recent Labs  Lab 06/04/21 0016 06/05/21 0528  AST 30 23  ALT 24 19  ALKPHOS 53 41  BILITOT 1.7*  1.7*  PROT 8.1 6.1*  ALBUMIN 3.6 2.7*   Recent Labs  Lab 06/04/21 0016 06/05/21 0528  LIPASE 636* 398*   No results for input(s): AMMONIA in the last 168 hours.  CBC: Recent Labs  Lab 06/04/21 0016 06/04/21 0550 06/05/21 0528  WBC 9.7 8.3 6.4  NEUTROABS 7.6  --  4.7  HGB 9.0* 7.9* 7.2*  HCT 25.5* 22.7* 20.6*  MCV 98.5 100.0 100.0  PLT 63* 68* 77*    Cardiac Enzymes: No results for input(s): CKTOTAL, CKMB, CKMBINDEX, TROPONINI in the last 168 hours.  BNP: Invalid input(s): POCBNP  CBG: No results for input(s): GLUCAP in the last 168 hours.  Microbiology: Results for orders placed or performed during the hospital encounter of 06/03/21  Resp Panel by RT-PCR (Flu A&B, Covid) Nasopharyngeal Swab     Status: None   Collection Time: 06/04/21  2:47 AM   Specimen: Nasopharyngeal Swab; Nasopharyngeal(NP) swabs in vial transport medium  Result Value Ref Range Status   SARS Coronavirus 2 by RT PCR NEGATIVE NEGATIVE Final    Comment: (NOTE) SARS-CoV-2 target nucleic acids are NOT DETECTED.  The SARS-CoV-2 RNA is generally detectable in upper respiratory specimens during the acute phase of infection. The lowest concentration of SARS-CoV-2 viral copies this assay can detect is 138 copies/mL. A negative result does not preclude SARS-Cov-2 infection and should not be used as the sole basis for treatment or other patient management decisions. A  negative result may occur with  improper specimen collection/handling, submission of specimen other than nasopharyngeal swab, presence of viral mutation(s) within the areas targeted by this assay, and inadequate number of viral copies(<138 copies/mL). A negative result must be combined with clinical observations, patient history, and epidemiological information. The expected result is Negative.  Fact Sheet for Patients:  EntrepreneurPulse.com.au  Fact Sheet for Healthcare Providers:   IncredibleEmployment.be  This test is no t yet approved or cleared by the [place] FDA and  has been authorized for detection and/or diagnosis of SARS-CoV-2 by FDA under an Emergency Use Authorization (EUA). This EUA will remain  in effect (meaning this test can be used) for the duration of the COVID-19 declaration under Section 564(b)(1) of the Act, 21 U.S.C.section 360bbb-3(b)(1), unless the authorization is terminated  or revoked sooner.       Influenza A by PCR NEGATIVE NEGATIVE Final   Influenza B by PCR NEGATIVE NEGATIVE Final    Comment: (NOTE) The Xpert Xpress SARS-CoV-2/FLU/RSV plus assay is intended as an aid in the diagnosis of influenza from Nasopharyngeal swab specimens and should not be used as a sole basis for treatment. Nasal washings and aspirates are unacceptable for Xpert Xpress SARS-CoV-2/FLU/RSV testing.  Fact Sheet for Patients: EntrepreneurPulse.com.au  Fact Sheet for Healthcare Providers: IncredibleEmployment.be  This test is not yet approved or cleared by the [place] FDA and has been authorized for detection and/or diagnosis of SARS-CoV-2 by FDA under an Emergency Use Authorization (EUA). This EUA will remain in effect (meaning this test can be used) for the duration of the COVID-19 declaration under Section 564(b)(1) of the Act, 21 U.S.C. section 360bbb-3(b)(1), unless the authorization is terminated or revoked.  Performed at [facility], [place] [address], [place], [place] [address]     Coagulation Studies: No results for input(s): LABPROT, INR in the last 72 hours.  Urinalysis: Recent Labs    06/04/21 0446  COLORURINE YELLOW  LABSPEC 1.016  PHURINE 5.0  GLUCOSEU NEGATIVE  HGBUR SMALL*  BILIRUBINUR NEGATIVE  KETONESUR NEGATIVE  PROTEINUR 100*  NITRITE NEGATIVE  LEUKOCYTESUR NEGATIVE      Imaging: CT Angio Chest/Abd/Pel for Dissection W and/or Wo  Contrast  Result Date: 06/04/2021 CLINICAL DATA:  lethargy and LLQ pain which began on Thursday. Pt also c/o indigestion. Hx of pancreatitis, EXAM: CT ANGIOGRAPHY CHEST, ABDOMEN AND PELVIS TECHNIQUE: Non-contrast CT of the chest was initially obtained. Multidetector CT imaging through the chest, abdomen and pelvis was performed using the standard protocol during bolus administration of intravenous contrast. Multiplanar reconstructed images and MIPs were obtained and reviewed to evaluate the vascular anatomy. CONTRAST:  94mL OMNIPAQUE IOHEXOL 350 MG/ML SOLN COMPARISON:  CT abdomen pelvis 08/18/2020, CT angiography chest abdomen pelvis 04/26/2021, CT angiography chest abdomen pelvis 12/11/2020 FINDINGS: CTA CHEST FINDINGS Cardiovascular: Preferential opacification of the thoracic aorta. Normal caliber and no dissection of the ascending thoracic aorta. 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 There is persistent patency of both the true and false lumen. The descending thoracic aorta is stable in size measuring up to 3.6 cm on axial imaging (4:37). Normal heart size. No pericardial effusion. No central pulmonary embolus. The main pulmonary artery measures at the upper limits of normal (3.1 cm). Mediastinum/Nodes: No enlarged mediastinal, hilar, or axillary lymph nodes. Thyroid gland, trachea, and esophagus demonstrate no significant findings. Lungs/Pleura: Bilateral lower lobe atelectasis. No focal consolidation. No pulmonary nodule. No pulmonary mass. No pleural effusion. Musculoskeletal:  No chest wall abnormality. No suspicious lytic or blastic osseous lesions. No acute displaced fracture. Review of the MIP images confirms the above findings. CTA ABDOMEN AND PELVIS FINDINGS VASCULAR Aorta: Similar-appearing continuation of a thoracic aorta dissection that extends through the abdominal aorta. Normal caliber aorta without aneurysm, vasculitis or  significant stenosis. Celiac: The dissection flap extends to the origin of the celiac artery with the celiac artery originating from the true lumen. Patent without evidence of aneurysm, dissection, vasculitis or significant stenosis. SMA: The dissection flap extends to the origin of the superior mesenteric vein with the superior mesenteric artery originating from the true lumen. Patent without evidence of aneurysm, dissection, vasculitis or significant stenosis. Renals: The left renal artery originates from the true lumen. Two renal arteries are noted to arise from the false lumen. The caudal most right renal artery originates from the false lumen with the dissection flap extending to its origin. Otherwise no definite dissection of the right renal artery. The renal arteries are patent without evidence of aneurysm, dissection, vasculitis, fibromuscular dysplasia or significant stenosis. IMA: The inferior mesenteric artery originates from the true lumen. Patent without evidence of aneurysm, dissection, vasculitis or significant stenosis. Inflow: The dissection extends into the right common iliac artery in terminates at the origin of the external and internal iliacs. The left common iliac artery originates from the true lumen. No other dissection identified. Otherwise the iliac arteries are patent without evidence of aneurysm, vasculitis or significant stenosis. Veins: No obvious venous abnormality within the limitations of this arterial phase study. Review of the MIP images confirms the above findings. NON-VASCULAR Hepatobiliary: No focal liver abnormality. No gallstones, gallbladder wall thickening, or pericholecystic fluid. No biliary dilatation. Pancreas: No focal lesion. Normal pancreatic contour. No surrounding inflammatory changes. No main pancreatic ductal dilatation. Spleen: Normal in size without focal abnormality. Adrenals/Urinary Tract: No adrenal nodule bilaterally. Bilateral kidneys enhance symmetrically.  No hydronephrosis. No hydroureter. The urinary bladder is unremarkable. Stomach/Bowel: Trace free fluid and stranding along the greater curve of the stomach (9:32). Otherwise stomach is within normal limits. No evidence of bowel wall thickening or dilatation. Slight pericolonic fat stranding along the splenic flexure. Appendix appears normal. Lymphatic: No lymphadenopathy. Reproductive: Similar-appearing asymmetric right adnexa compared to left consistent with an exophytic right posterior uterine fibroid. Redemonstration of a left fundal uterine fibroid (10:93). Otherwise uterus and bilateral adnexa are unremarkable. Other: Trace free fluid along the greater curve of the stomach. Musculoskeletal: No abdominal wall hernia or abnormality. No suspicious lytic or blastic osseous lesions. No acute displaced fracture. Review of the MIP images confirms the above findings. IMPRESSION: 1. 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 No acute complication. 2. Trace free fluid and fat stranding surrounding the splenic flexure and gastric greater curvature. Given close proximity to the tail of the pancreas, consider further evaluation with lipase levels for acute pancreatitis. 3. Uterine fibroids. These results were called by telephone at the time of interpretation on 06/04/2021 at 2:40 am to provider [facility] , who verbally acknowledged these results. Pancreas results will be called to the ordering clinician or representative by the Radiologist Assistant, and communication documented in the PACS or [employer]. Electronically Signed   By: Iven Finn M.D.   On: 06/04/2021 03:03     Medications:    sodium chloride Stopped (06/04/21 1801)   lactated ringers 100 mL/hr at 06/05/21 0349  amLODipine  5 mg Oral Daily   carvedilol  25 mg Oral BID WC   cloNIDine  0.1 mg Oral  TID   folic acid  1 mg Oral Daily   heparin  5,000 Units Subcutaneous Q8H   hydrALAZINE  75 mg Oral Q8H   prazosin  2 mg Oral QHS   thiamine  100 mg Oral Daily   acetaminophen, HYDROmorphone (DILAUDID) injection, labetalol, ondansetron (ZOFRAN) IV, oxyCODONE  Assessment/ Plan:  AKI on CKD 3A - b/l creat from May 2022 was 1.5, eGFR 47 ml/min. Creat here 9.0 in setting of significant abd pain, indigestion and anorexia. Pt is dehydrated and has been taking losartan as usual. Suspect this is primary cause of AKI.  Improved with decreased creatinine.  Increased urine output Hx thoracic aortic dissection - in 2021, repeat CTA w/ contrast today showed no change from prior studies in 2021,  w/ true lumen perfusing both kidneys except for an extra R renal artery is per the false lumen. HTN- severe, hx malignant.  Taking all her meds.   Improved Abd pain / [place] Dura - c/w pancreatitis Thrombocytopenia - hx of same w/ pancreatitis episode last year Volume -continue IV hydration Hypokalemia fluids changed to lactated Ringer's 100 cc an hour.  Replete with potassium.    LOS: [place] _0 _1 :28 AM

## 2021-06-05 NOTE — Progress Notes (Signed)
Patient offered her second Covid vaccine. Patient decline stating that she doe not want it until she is being discharged.

## 2021-06-06 LAB — COMPREHENSIVE METABOLIC PANEL
ALT: 18 U/L (ref 0–44)
AST: 21 U/L (ref 15–41)
Albumin: 2.6 g/dL — ABNORMAL LOW (ref 3.5–5.0)
Alkaline Phosphatase: 37 U/L — ABNORMAL LOW (ref 38–126)
Anion gap: 8 (ref 5–15)
BUN: 45 mg/dL — ABNORMAL HIGH (ref 6–20)
CO2: 20 mmol/L — ABNORMAL LOW (ref 22–32)
Calcium: 8.4 mg/dL — ABNORMAL LOW (ref 8.9–10.3)
Chloride: 110 mmol/L (ref 98–111)
Creatinine, Ser: 3.13 mg/dL — ABNORMAL HIGH (ref 0.44–1.00)
GFR, Estimated: 19 mL/min — ABNORMAL LOW (ref >60)
Glucose, Bld: 107 mg/dL — ABNORMAL HIGH (ref 70–99)
Potassium: 3.4 mmol/L — ABNORMAL LOW (ref 3.5–5.1)
Sodium: 138 mmol/L (ref 135–145)
Total Bilirubin: 1 mg/dL (ref 0.3–1.2)
Total Protein: 5.8 g/dL — ABNORMAL LOW (ref 6.5–8.1)

## 2021-06-06 LAB — CBC WITH DIFFERENTIAL/PLATELET
Abs Immature Granulocytes: 0.2 10*3/uL — ABNORMAL HIGH (ref 0.00–0.07)
Basophils Absolute: 0 10*3/uL (ref 0.0–0.1)
Basophils Relative: 0 %
Eosinophils Absolute: 0.2 10*3/uL (ref 0.0–0.5)
Eosinophils Relative: 3 %
HCT: 19.4 % — ABNORMAL LOW (ref 36.0–46.0)
Hemoglobin: 6.6 g/dL — CL (ref 12.0–15.0)
Immature Granulocytes: 4 %
Lymphocytes Relative: 24 %
Lymphs Abs: 1.4 10*3/uL (ref 0.7–4.0)
MCH: 34.2 pg — ABNORMAL HIGH (ref 26.0–34.0)
MCHC: 34 g/dL (ref 30.0–36.0)
MCV: 100.5 fL — ABNORMAL HIGH (ref 80.0–100.0)
Monocytes Absolute: 0.4 10*3/uL (ref 0.1–1.0)
Monocytes Relative: 8 %
Neutro Abs: 3.4 10*3/uL (ref 1.7–7.7)
Neutrophils Relative %: 61 %
Platelets: 103 10*3/uL — ABNORMAL LOW (ref 150–400)
RBC: 1.93 MIL/uL — ABNORMAL LOW (ref 3.87–5.11)
RDW: 17.2 % — ABNORMAL HIGH (ref 11.5–15.5)
WBC: 5.6 10*3/uL (ref 4.0–10.5)
nRBC: 0 % (ref 0.0–0.2)

## 2021-06-06 LAB — PHOSPHORUS: Phosphorus: 3.8 mg/dL (ref 2.5–4.6)

## 2021-06-06 LAB — HEMOGLOBIN AND HEMATOCRIT, BLOOD
HCT: 23.6 % — ABNORMAL LOW (ref 36.0–46.0)
Hemoglobin: 8.1 g/dL — ABNORMAL LOW (ref 12.0–15.0)

## 2021-06-06 LAB — AMYLASE: Amylase: 371 U/L — ABNORMAL HIGH (ref 28–100)

## 2021-06-06 LAB — MAGNESIUM: Magnesium: 1.3 mg/dL — ABNORMAL LOW (ref 1.7–2.4)

## 2021-06-06 LAB — PREPARE RBC (CROSSMATCH)

## 2021-06-06 MED ORDER — SODIUM CHLORIDE 0.9% IV SOLUTION: Freq: Once | INTRAVENOUS | Status: AC

## 2021-06-06 MED ORDER — POTASSIUM CHLORIDE 10 MEQ/100ML IV SOLN
10.0000 meq | INTRAVENOUS | Status: AC
  Administered 2021-06-06 (×2): 10 meq via INTRAVENOUS
  Filled 2021-06-06: qty 100

## 2021-06-06 NOTE — Progress Notes (Signed)
PROGRESS NOTE   Robin Guerrero  VEL:[id number] DOB: 1986-05-16 DOA: 06/03/2021 PCP: Trey Sailors, PA  Brief Narrative:  35 year old female Prior alcoholic pancreatitis 01/5851 complicated by ileus Hypertension with sinus tach BMI 36 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) on admission 11/07/2020 being managed currently medically  Admit 06/12/2021 bad indigestion several weeks seen in the emergency room 04/2021 but sent home and improved Developed further issues Came to [facility], ED-found to have a acute renal failure with creatinine about 10 with baseline 0.8 Also found to have significant abdominal pain with lipase in the 600 range--repeat CT abdomen showed chronic appearing type B dissection with duplicative findings from prior   Hospital-Problem based course  Acute kidney injury Secondary to SIRS/ARB Improving slowly-do not anticipate any dialysis needs Pancreatitis-mild Tolerating diet now-no further work-up at this time-seems to be acute superimposed on chronic given she has had this in the past Cut back fluids to 50 cc/H Type B aortic dissection-chronic Strict blood pressure control-continue amlodipine 5, Coreg 25 twice daily, clonidine 0.1 3 times daily, hydralazine 75 nightly and prazosin 2 mg at bedtime Patient will need close follow-up with Dr. Stanford Breed [facility]--I will CC him Patient has not been able to get to his office because of insurance issues Hypokalemia from prior to admission Received replacement earlier in hospital stay Now improved Blood loss and dilutional anemia from heavy periods/resulting amenorrhea?--follows with Dr. Mora Bellman Previously Rx with [facility] She will need outpatient follow-up with specialist--I will CC Dr. Elly Modena to make her aware   DVT prophylaxis: Heparin Code Status: Full Family Communication: None Disposition:  Status is: Inpatient  Remains inpatient appropriate because:Hemodynamically  unstable, Unsafe d/c plan, and IV treatments appropriate due to intensity of illness or inability to take PO  Dispo: The patient is from: Home              Anticipated d/c is to: Home              Patient currently is not medically stable to d/c.   Difficult to place patient No       Consultants:    Procedures:   Antimicrobials:     Subjective: Is well no distress eating drinking no chest pain no fever no chills Complains of continued spotting since January of this year-tells me he was seeing OB/GYN Has not been able to visit vascular surgery because of insurance issues  Objective: Vitals:   06/06/21 0629 06/06/21 1207 06/06/21 1222 06/06/21 1229  BP: (!) 143/84 128/86 121/90 126/86  Pulse: 92 86 88 85  Resp: 19 18 18 16   Temp: 98.7 F (37.1 C) 98.9 F (37.2 C) 97.7 F (36.5 C) 98.2 F (36.8 C)  TempSrc: Oral Oral Oral Oral  SpO2: 96% 98% 100% 99%  Weight: 109.3 kg     Height:        Intake/Output Summary (Last 24 hours) at 06/06/2021 1434 Last data filed at 06/06/2021 7782 Gross per 24 hour  Intake 940.79 ml  Output 900 ml  Net 40.79 ml   Filed Weights   06/03/21 2200 06/05/21 0431 06/06/21 0629  Weight: 106.6 kg 106.9 kg 109.3 kg    Examination:  EOMI NCAT thick neck slightly cushingoid Fall/chest Chest clear no rales no rhonchi No epigastric tenderness abdomen rotund  no rebound no guarding No lower extremity edema Neurologically intact no focal deficit Psych euthymic  Data Reviewed: personally reviewed   CBC    Component Value Date/Time  WBC 5.6 06/06/2021 0219   RBC 1.93 (L) 06/06/2021 0219   HGB 6.6 (LL) 06/06/2021 0219   HCT 19.4 (L) 06/06/2021 0219   PLT 103 (L) 06/06/2021 0219   MCV 100.5 (H) 06/06/2021 0219   MCH 34.2 (H) 06/06/2021 0219   MCHC 34.0 06/06/2021 0219   RDW 17.2 (H) 06/06/2021 0219   LYMPHSABS 1.4 06/06/2021 0219   MONOABS 0.4 06/06/2021 0219   EOSABS 0.2 06/06/2021 0219   BASOSABS 0.0 06/06/2021 0219   CMP  Latest Ref Rng & Units 06/06/2021 06/05/2021 06/05/2021  Glucose 70 - 99 mg/dL 107(H) - 112(H)  BUN 6 - 20 mg/dL 45(H) - 60(H)  Creatinine 0.44 - 1.00 mg/dL 3.13(H) - 5.41(H)  Sodium 135 - 145 mmol/L 138 - 137  Potassium 3.5 - 5.1 mmol/L 3.4(L) 3.4(L) 2.9(L)  Chloride 98 - 111 mmol/L 110 - 108  CO2 22 - 32 mmol/L 20(L) - 19(L)  Calcium 8.9 - 10.3 mg/dL 8.4(L) - 7.6(L)  Total Protein 6.5 - 8.1 g/dL 5.8(L) - 6.1(L)  Total Bilirubin 0.3 - 1.2 mg/dL 1.0 - 1.7(H)  Alkaline Phos 38 - 126 U/L 37(L) - 41  AST 15 - 41 U/L 21 - 23  ALT 0 - 44 U/L 18 - 19     Radiology Studies: No results found.   Scheduled Meds:  amLODipine  5 mg Oral Daily   carvedilol  25 mg Oral BID WC   cloNIDine  0.1 mg Oral TID   COVID-19 mRNA Vac-TriS ([employer])  0.3 mL Intramuscular Once   folic acid  1 mg Oral Daily   heparin  5,000 Units Subcutaneous Q8H   hydrALAZINE  75 mg Oral Q8H   prazosin  2 mg Oral QHS   thiamine  100 mg Oral Daily   Continuous Infusions:  sodium chloride Stopped (06/04/21 1801)   potassium chloride       LOS: 2 days   Time spent: 69  Nita Sells, MD [facility] To contact the attending provider between 7A-7P or the covering provider during after hours 7P-7A, please log into the web site [email or web address] and access using universal [place] password for that web site. If you do not have the password, please call the hospital operator.  06/06/2021, 2:34 PM

## 2021-06-06 NOTE — Progress Notes (Signed)
[facility] ROUNDING NOTE   Subjective:   Interval History: Is a 35 year old lady history of severe hypertension anemia history of thoracic descending aortic aneurysm dissection, acute pancreatitis alcohol abuse tobacco abuse.  She presented with acute kidney injury.  In the emergency room she underwent CT scan of chest abdomen pelvis with contrast and without.  She was taking losartan as an outpatient.  Creatinine increased to 9 mg/dL.  Appears that she was dehydrated and was resuscitated with IV fluids and normal saline 125 cc an hour.  She is doing better this morning more awake alert feeling improved.  Blood pressure 140/84 pulse 90 temperature 90.7 O2 sats 96% room air  Sodium sodium 138 potassium 3.4 chloride 110 CO2 20 BUN 45 creatinine 3.12 glucose 107 calcium 8.4 albumin 2.6 hemoglobin 6.6  Objective:  Vital signs in last 24 hours:  Temp:  [98.4 F (36.9 C)-99 F (37.2 C)] 98.7 F (37.1 C) (06/22 0629) Pulse Rate:  [90-94] 92 (06/22 0629) Resp:  [19] 19 (06/22 0629) BP: (120-143)/(75-84) 143/84 (06/22 0629) SpO2:  [96 %-100 %] 96 % (06/22 0629) Weight:  [109.3 kg] 109.3 kg (06/22 0629)  Weight change: 2.371 kg Filed Weights   06/03/21 2200 06/05/21 0431 06/06/21 0629  Weight: 106.6 kg 106.9 kg 109.3 kg    Intake/Output: I/O last 3 completed shifts: In: 2250.7 [P.O.:877; I.V.:869.9; IV Piggyback:503.8] Out: 3200 [Urine:3200]   Intake/Output this shift:  No intake/output data recorded.  CVS- RRR RS- CTA ABD- BS present soft non-distended EXT- no edema   Basic Metabolic Panel: Recent Labs  Lab 06/04/21 0016 06/04/21 0550 06/05/21 0528 06/05/21 2216 06/06/21 0219  NA 133* 132* 137  --  138  K 3.0* 3.6 2.9* 3.4* 3.4*  CL 97* 99 108  --  110  CO2 21* 20* 19*  --  20*  GLUCOSE 111* 110* 112*  --  107*  BUN 84* 81* 60*  --  45*  CREATININE 9.91* 9.06* 5.41*  --  3.13*  CALCIUM 7.7* 7.3* 7.6*  --  8.4*  MG  --   --   --  1.3* 1.3*  PHOS  --    --   --   --  3.8     Liver Function Tests: Recent Labs  Lab 06/04/21 0016 06/05/21 0528 06/06/21 0219  AST 30 23 21   ALT 24 19 18   ALKPHOS 53 41 37*  BILITOT 1.7* 1.7* 1.0  PROT 8.1 6.1* 5.8*  ALBUMIN 3.6 2.7* 2.6*    Recent Labs  Lab 06/04/21 0016 06/05/21 0528 06/06/21 0219  LIPASE 636* 398*  --   AMYLASE  --   --  371*    No results for input(s): AMMONIA in the last 168 hours.  CBC: Recent Labs  Lab 06/04/21 0016 06/04/21 0550 06/05/21 0528 06/06/21 0219  WBC 9.7 8.3 6.4 5.6  NEUTROABS 7.6  --  4.7 3.4  HGB 9.0* 7.9* 7.2* 6.6*  HCT 25.5* 22.7* 20.6* 19.4*  MCV 98.5 100.0 100.0 100.5*  PLT 63* 68* 77* 103*     Cardiac Enzymes: No results for input(s): CKTOTAL, CKMB, CKMBINDEX, TROPONINI in the last 168 hours.  BNP: Invalid input(s): POCBNP  CBG: No results for input(s): GLUCAP in the last 168 hours.  Microbiology: Results for orders placed or performed during the hospital encounter of 06/03/21  Resp Panel by RT-PCR (Flu A&B, Covid) Nasopharyngeal Swab     Status: None   Collection Time: 06/04/21  2:47 AM   Specimen: Nasopharyngeal Swab;  Nasopharyngeal(NP) swabs in vial transport medium  Result Value Ref Range Status   SARS Coronavirus 2 by RT PCR NEGATIVE NEGATIVE Final    Comment: (NOTE) SARS-CoV-2 target nucleic acids are NOT DETECTED.  The SARS-CoV-2 RNA is generally detectable in upper respiratory specimens during the acute phase of infection. The lowest concentration of SARS-CoV-2 viral copies this assay can detect is 138 copies/mL. A negative result does not preclude SARS-Cov-2 infection and should not be used as the sole basis for treatment or other patient management decisions. A negative result may occur with  improper specimen collection/handling, submission of specimen other than nasopharyngeal swab, presence of viral mutation(s) within the areas targeted by this assay, and inadequate number of viral copies(<138 copies/mL). A  negative result must be combined with clinical observations, patient history, and epidemiological information. The expected result is Negative.  Fact Sheet for Patients:  EntrepreneurPulse.com.au  Fact Sheet for Healthcare Providers:  IncredibleEmployment.be  This test is no t yet approved or cleared by the [place] FDA and  has been authorized for detection and/or diagnosis of SARS-CoV-2 by FDA under an Emergency Use Authorization (EUA). This EUA will remain  in effect (meaning this test can be used) for the duration of the COVID-19 declaration under Section 564(b)(1) of the Act, 21 U.S.C.section 360bbb-3(b)(1), unless the authorization is terminated  or revoked sooner.       Influenza A by PCR NEGATIVE NEGATIVE Final   Influenza B by PCR NEGATIVE NEGATIVE Final    Comment: (NOTE) The Xpert Xpress SARS-CoV-2/FLU/RSV plus assay is intended as an aid in the diagnosis of influenza from Nasopharyngeal swab specimens and should not be used as a sole basis for treatment. Nasal washings and aspirates are unacceptable for Xpert Xpress SARS-CoV-2/FLU/RSV testing.  Fact Sheet for Patients: EntrepreneurPulse.com.au  Fact Sheet for Healthcare Providers: IncredibleEmployment.be  This test is not yet approved or cleared by the [place] FDA and has been authorized for detection and/or diagnosis of SARS-CoV-2 by FDA under an Emergency Use Authorization (EUA). This EUA will remain in effect (meaning this test can be used) for the duration of the COVID-19 declaration under Section 564(b)(1) of the Act, 21 U.S.C. section 360bbb-3(b)(1), unless the authorization is terminated or revoked.  Performed at [facility], [place] [address] [place], [place] [address]     Coagulation Studies: No results for input(s): LABPROT, INR in the last 72 hours.  Urinalysis: Recent Labs    06/04/21 0446   COLORURINE YELLOW  LABSPEC 1.016  PHURINE 5.0  GLUCOSEU NEGATIVE  HGBUR SMALL*  BILIRUBINUR NEGATIVE  KETONESUR NEGATIVE  PROTEINUR 100*  NITRITE NEGATIVE  LEUKOCYTESUR NEGATIVE       Imaging: No results found.   Medications:    sodium chloride Stopped (06/04/21 1801)    amLODipine  5 mg Oral Daily   carvedilol  25 mg Oral BID WC   cloNIDine  0.1 mg Oral TID   COVID-19 mRNA Vac-TriS (Pf[employer])  0.3 mL Intramuscular Once   folic acid  1 mg Oral Daily   heparin  5,000 Units Subcutaneous Q8H   hydrALAZINE  75 mg Oral Q8H   prazosin  2 mg Oral QHS   thiamine  100 mg Oral Daily   acetaminophen, HYDROmorphone (DILAUDID) injection, labetalol, ondansetron (ZOFRAN) IV, oxyCODONE  Assessment/ Plan:  AKI on CKD 3A - b/l creat from May 2022 was 1.5, eGFR 47 ml/min. Creat here 9.0 in setting of significant abd pain, indigestion and anorexia. Pt is dehydrated and has been  taking losartan as usual. Suspect this is primary cause of AKI.  Improved with decreased creatinine.  Increased urine output Hx thoracic aortic dissection - in 2021, repeat CTA w/ contrast today showed no change from prior studies in 2021,  w/ true lumen perfusing both kidneys except for an extra R renal artery is per the false lumen. HTN- severe, hx malignant.  Taking all her meds.   Improved Abd pain / [place] Dura - c/w pancreatitis Thrombocytopenia - hx of same w/ pancreatitis episode last year Volume -IV fluids discontinued patient appears to be eating Hypokalemia would recommend repletion    LOS: 2 Sherril Croon @TODAY @11 :30 AM

## 2021-06-06 NOTE — Plan of Care (Signed)

## 2021-06-07 ENCOUNTER — Encounter (HOSPITAL_COMMUNITY): Payer: Self-pay | Admitting: *Deleted

## 2021-06-07 ENCOUNTER — Encounter (HOSPITAL_COMMUNITY): Payer: Self-pay

## 2021-06-07 ENCOUNTER — Encounter: Payer: Self-pay | Admitting: Family Medicine

## 2021-06-07 LAB — CBC WITH DIFFERENTIAL/PLATELET
Abs Immature Granulocytes: 0.26 10*3/uL — ABNORMAL HIGH (ref 0.00–0.07)
Basophils Absolute: 0 10*3/uL (ref 0.0–0.1)
Basophils Relative: 0 %
Eosinophils Absolute: 0.2 10*3/uL (ref 0.0–0.5)
Eosinophils Relative: 3 %
HCT: 22.4 % — ABNORMAL LOW (ref 36.0–46.0)
Hemoglobin: 7.7 g/dL — ABNORMAL LOW (ref 12.0–15.0)
Immature Granulocytes: 4 %
Lymphocytes Relative: 21 %
Lymphs Abs: 1.4 10*3/uL (ref 0.7–4.0)
MCH: 33.2 pg (ref 26.0–34.0)
MCHC: 34.4 g/dL (ref 30.0–36.0)
MCV: 96.6 fL (ref 80.0–100.0)
Monocytes Absolute: 0.5 10*3/uL (ref 0.1–1.0)
Monocytes Relative: 8 %
Neutro Abs: 4.3 10*3/uL (ref 1.7–7.7)
Neutrophils Relative %: 64 %
Platelets: 147 10*3/uL — ABNORMAL LOW (ref 150–400)
RBC: 2.32 MIL/uL — ABNORMAL LOW (ref 3.87–5.11)
RDW: 18.9 % — ABNORMAL HIGH (ref 11.5–15.5)
WBC: 6.6 10*3/uL (ref 4.0–10.5)
nRBC: 0.3 % — ABNORMAL HIGH (ref 0.0–0.2)

## 2021-06-07 LAB — TYPE AND SCREEN
ABO/RH(D): A POS
Antibody Screen: NEGATIVE
Unit division: 0

## 2021-06-07 LAB — COMPREHENSIVE METABOLIC PANEL
ALT: 22 U/L (ref 0–44)
AST: 26 U/L (ref 15–41)
Albumin: 2.6 g/dL — ABNORMAL LOW (ref 3.5–5.0)
Alkaline Phosphatase: 40 U/L (ref 38–126)
Anion gap: 7 (ref 5–15)
BUN: 30 mg/dL — ABNORMAL HIGH (ref 6–20)
CO2: 20 mmol/L — ABNORMAL LOW (ref 22–32)
Calcium: 8.5 mg/dL — ABNORMAL LOW (ref 8.9–10.3)
Chloride: 111 mmol/L (ref 98–111)
Creatinine, Ser: 2.07 mg/dL — ABNORMAL HIGH (ref 0.44–1.00)
GFR, Estimated: 31 mL/min — ABNORMAL LOW (ref >60)
Glucose, Bld: 104 mg/dL — ABNORMAL HIGH (ref 70–99)
Potassium: 3.5 mmol/L (ref 3.5–5.1)
Sodium: 138 mmol/L (ref 135–145)
Total Bilirubin: 1 mg/dL (ref 0.3–1.2)
Total Protein: 6 g/dL — ABNORMAL LOW (ref 6.5–8.1)

## 2021-06-07 LAB — BPAM RBC
Blood Product Expiration Date: 202207082359
ISSUE DATE / TIME: 202206221149
Unit Type and Rh: 6200

## 2021-06-07 MED ORDER — AMLODIPINE BESYLATE 10 MG PO TABS: 10.0000 mg | ORAL_TABLET | Freq: Every day | ORAL | Status: DC

## 2021-06-07 MED ORDER — PRAZOSIN HCL 2 MG PO CAPS: 2.0000 mg | ORAL_CAPSULE | Freq: Every day | ORAL | 3 refills | Status: DC

## 2021-06-07 MED ORDER — AMLODIPINE BESYLATE 10 MG PO TABS: 10.0000 mg | ORAL_TABLET | Freq: Every day | ORAL | 2 refills | Status: DC

## 2021-06-07 MED ORDER — FERROUS SULFATE 325 (65 FE) MG PO TABS: 325.0000 mg | ORAL_TABLET | Freq: Two times a day (BID) | ORAL | 3 refills | Status: DC

## 2021-06-07 NOTE — Progress Notes (Signed)
Pt DC to home. All DC instructions reviewed with pt and copy given. Pt understands her follow up appointments to be made.

## 2021-06-07 NOTE — Plan of Care (Signed)
Pt ready for discharge to home. AVS printed.

## 2021-06-07 NOTE — Discharge Summary (Signed)
Physician Discharge Summary  Robin Guerrero VOZ:[id number] DOB: [birth year] DOA: 06/03/2021  PCP: Trey Sailors, PA  Admit date: 06/03/2021 Discharge date: 06/07/2021  Time spent: 37 minutes  Recommendations for Outpatient Follow-up:  HCTZ/losartan DC this admission-consider outpatient resumption based on labs Needs Chem-12, CBC in 1 week-resume above meds if needed Needs coordination of care with Dr. Mora Bellman OB/GYN who I will CC on this note-May require Megace versus further work-up for DUB/irregular periods--?  May require endometrial biopsy/ultrasound transvaginal per them Recommend outpatient coordination of care with vascular surgeon Dr. 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 Hypertension with sinus tach BMI 36 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) on admission 11/07/2020 being managed currently medically   Admit 06/12/2021 bad indigestion several weeks seen in the emergency room 04/2021 but sent home and improved Developed further issues Came to [facility], ED-found to have a acute renal failure with creatinine about 10 with baseline 0.8 Also found to have significant abdominal pain with lipase in the 600 range--repeat CT abdomen showed chronic appearing type B dissection with duplicative findings from prior    Hospital Course:  Acute kidney  injury Secondary to SIRS/ARB Improving slowly-do not anticipate any dialysis needs--cleared by nephrologist Dr. Justin Mend for discharge Losartan/HCTZ discontinued this admission and may need to resumption in the outpatient setting based on PCP work-up Pancreatitis-mild Tolerating diet now-no further work-up at this time-seems to be acute superimposed on chronic given she has had this in the past Tolerating diet on discharge without issue Type B aortic dissection-chronic Strict blood pressure control-continue Coreg 25 twice daily, clonidine 0.1 3 times daily, hydralazine 75 nightly and prazosin 2 mg at bedtime Amlodipine was increased to 10 mg on discharge given discontinuation of above meds Dr. Stanford Breed VVS will need to see her in the outpatient setting Patient aware to follow-up after July 1 when her insurance kicks back over Hypokalemia from prior to admission Received replacement earlier in hospital stay Now improved Blood loss and dilutional anemia from heavy periods/resulting amenorrhea?--follows with Dr. Mora Bellman Previously Rx with Megace by OB/GYN I will CC Dr. Elly Modena to make her aware    Consultations: Nephrology Dr. Justin Mend  Discharge Exam: Vitals:   06/07/21 0612 06/07/21 0857  BP: (!) 148/97 (!) 151/97  Pulse: 83 85  Resp: 17   Temp: 98.7 F (37.1 C)   SpO2: 99% 97%    Subj on day of d/c    Awake coherent no distress chest clear no rales rhonchi Eating drinking no other issue Ask if she can go home Tells me he has to go to work asking for work excuse letter Passing good urine  General Exam on discharge  EOMI NCAT no focal deficit chest clear no rales rhonchi Mallampati 4 Moderate dentition S1-S2 no murmur Abdomen soft no rebound No lower extremity edema Neurologically intact  Discharge Instructions   Discharge Instructions     Diet -  low sodium heart healthy   Complete by: As directed    Discharge instructions   Complete by: As directed    Make  sure that you look at your meds carefully as several have changed example-we have discontinued your hydrochlorothiazide as well as discontinued your losartan We have increased your dose of amlodipine and we would recommend that you follow-up closely with OB/GYN Dr. Elly Modena with regards to your bleeding as you may need further input from her with regards to what the next steps need to be taken Remember that you will also need follow-up with vascular surgeon at some point when you are able with regards to your dissection Please keep a record of your blood pressures and ensure that you follow-up closely with your primary care physician We have refilled your prazosin at your request and we will give you a work excuse letter  Please ensure that you get lab work within the next 2 weeks at least to determine how your kidneys are doing and if some of your other medications that were discontinued as above can be resumed at lower doses   Increase activity slowly   Complete by: As directed       Allergies as of 06/07/2021       Reactions   Ativan [lorazepam] Other (See Comments)   Disorientated, combative        Medication List     STOP taking these medications    hydrochlorothiazide 25 MG tablet Commonly known as: HYDRODIURIL   losartan 100 MG tablet Commonly known as: COZAAR       TAKE these medications    acetaminophen 325 MG tablet Commonly known as: TYLENOL Take 650 mg by mouth every 6 (six) hours as needed for mild pain, fever or headache.   amLODipine 10 MG tablet Commonly known as: NORVASC Take 1 tablet (10 mg total) by mouth daily. Start taking on: June 08, 2021 What changed:  medication strength how much to take   carvedilol 25 MG tablet Commonly known as: COREG Take 1 tablet (25 mg total) by mouth 2 (two) times daily with a meal.   cloNIDine 0.1 MG tablet Commonly known as: CATAPRES Take 1 tablet (0.1 mg total) by mouth 3 (three) times daily.   ferrous sulfate  325 (65 FE) MG tablet Take 1 tablet (325 mg total) by mouth 2 (two) times daily with a meal. What changed: when to take this   hydrALAZINE 50 MG tablet Commonly known as: APRESOLINE TAKE 1 AND 1/2 TABLETS (75 MG TOTAL) BY MOUTH EVERY EIGHT HOURS.   multivitamin with minerals Tabs tablet Take 1 tablet by mouth daily.   prazosin 2 MG capsule Commonly known as: MINIPRESS Take 1 capsule (2 mg total) by mouth at bedtime.   QUEtiapine 25 MG tablet Commonly known as: SEROQUEL TAKE 1 TABLET (25 MG TOTAL) BY MOUTH TWO TIMES DAILY.   thiamine 100 MG tablet TAKE 1 TABLET (100 MG TOTAL) BY MOUTH DAILY.       ASK your doctor about these medications    dicyclomine 20 MG tablet Commonly known as: BENTYL Take 1 tablet (20 mg total) by mouth 2 (two) times daily.       Allergies  Allergen Reactions   Ativan [Lorazepam] Other (See Comments)    Disorientated, combative      The results of significant diagnostics from this hospitalization (including imaging, microbiology, ancillary and laboratory) are listed below for reference.    Significant Diagnostic Studies: CT Angio Chest/Abd/Pel for Dissection  W and/or Wo Contrast  Result Date: 06/04/2021 CLINICAL DATA:  lethargy and LLQ pain which began on Thursday. Pt also c/o indigestion. Hx of pancreatitis, EXAM: CT ANGIOGRAPHY CHEST, ABDOMEN AND PELVIS TECHNIQUE: Non-contrast CT of the chest was initially obtained. Multidetector CT imaging through the chest, abdomen and pelvis was performed using the standard protocol during bolus administration of intravenous contrast. Multiplanar reconstructed images and MIPs were obtained and reviewed to evaluate the vascular anatomy. CONTRAST:  6mL OMNIPAQUE IOHEXOL 350 MG/ML SOLN COMPARISON:  CT abdomen pelvis 08/18/2020, CT angiography chest abdomen pelvis 04/26/2021, CT angiography chest abdomen pelvis 12/11/2020 FINDINGS: CTA CHEST FINDINGS Cardiovascular: Preferential opacification of the thoracic  aorta. Normal caliber and no dissection of the ascending thoracic aorta. 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 There is persistent patency of both the true and false lumen. The descending thoracic aorta is stable in size measuring up to 3.6 cm on axial imaging (4:37). Normal heart size. No pericardial effusion. No central pulmonary embolus. The main pulmonary artery measures at the upper limits of normal (3.1 cm). Mediastinum/Nodes: No enlarged mediastinal, hilar, or axillary lymph nodes. Thyroid gland, trachea, and esophagus demonstrate no significant findings. Lungs/Pleura: Bilateral lower lobe atelectasis. No focal consolidation. No pulmonary nodule. No pulmonary mass. No pleural effusion. Musculoskeletal: No chest wall abnormality. No suspicious lytic or blastic osseous lesions. No acute displaced fracture. Review of the MIP images confirms the above findings. CTA ABDOMEN AND PELVIS FINDINGS VASCULAR Aorta: Similar-appearing continuation of a thoracic aorta dissection that extends through the abdominal aorta. Normal caliber aorta without aneurysm, vasculitis or significant stenosis. Celiac: The dissection flap extends to the origin of the celiac artery with the celiac artery originating from the true lumen. Patent without evidence of aneurysm, dissection, vasculitis or significant stenosis. SMA: The dissection flap extends to the origin of the superior mesenteric vein with the superior mesenteric artery originating from the true lumen. Patent without evidence of aneurysm, dissection, vasculitis or significant stenosis. Renals: The left renal artery originates from the true lumen. Two renal arteries are noted to arise from the false lumen. The caudal most right renal artery originates from the false lumen with the dissection flap extending to its origin. Otherwise no definite dissection of the right renal artery. The renal arteries are  patent without evidence of aneurysm, dissection, vasculitis, fibromuscular dysplasia or significant stenosis. IMA: The inferior mesenteric artery originates from the true lumen. Patent without evidence of aneurysm, dissection, vasculitis or significant stenosis. Inflow: The dissection extends into the right common iliac artery in terminates at the origin of the external and internal iliacs. The left common iliac artery originates from the true lumen. No other dissection identified. Otherwise the iliac arteries are patent without evidence of aneurysm, vasculitis or significant stenosis. Veins: No obvious venous abnormality within the limitations of this arterial phase study. Review of the MIP images confirms the above findings. NON-VASCULAR Hepatobiliary: No focal liver abnormality. No gallstones, gallbladder wall thickening, or pericholecystic fluid. No biliary dilatation. Pancreas: No focal lesion. Normal pancreatic contour. No surrounding inflammatory changes. No main pancreatic ductal dilatation. Spleen: Normal in size without focal abnormality. Adrenals/Urinary Tract: No adrenal nodule bilaterally. Bilateral kidneys enhance symmetrically. No hydronephrosis. No hydroureter. The urinary bladder is unremarkable. Stomach/Bowel: Trace free fluid and stranding along the greater curve of the stomach (9:32). Otherwise stomach is within normal limits. No evidence of bowel wall thickening or dilatation. Slight pericolonic fat stranding along the splenic flexure. Appendix appears normal.  Lymphatic: No lymphadenopathy. Reproductive: Similar-appearing asymmetric right adnexa compared to left consistent with an exophytic right posterior uterine fibroid. Redemonstration of a left fundal uterine fibroid (10:93). Otherwise uterus and bilateral adnexa are unremarkable. Other: Trace free fluid along the greater curve of the stomach. Musculoskeletal: No abdominal wall hernia or abnormality. No suspicious lytic or blastic osseous  lesions. No acute displaced fracture. Review of the MIP images confirms the above findings. IMPRESSION: 1. 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 No acute complication. 2. Trace free fluid and fat stranding surrounding the splenic flexure and gastric greater curvature. Given close proximity to the tail of the pancreas, consider further evaluation with lipase levels for acute pancreatitis. 3. Uterine fibroids. These results were called by telephone at the time of interpretation on 06/04/2021 at 2:40 am to provider [facility] , who verbally acknowledged these results. Pancreas results will be called to the ordering clinician or representative by the Radiologist Assistant, and communication documented in the PACS or [employer]. Electronically Signed   By: Iven Finn M.D.   On: 06/04/2021 03:03    Microbiology: Recent Results (from the past 240 hour(s))  Resp Panel by RT-PCR (Flu A&B, Covid) Nasopharyngeal Swab     Status: None   Collection Time: 06/04/21  2:47 AM   Specimen: Nasopharyngeal Swab; Nasopharyngeal(NP) swabs in vial transport medium  Result Value Ref Range Status   SARS Coronavirus 2 by RT PCR NEGATIVE NEGATIVE Final    Comment: (NOTE) SARS-CoV-2 target nucleic acids are NOT DETECTED.  The SARS-CoV-2 RNA is generally detectable in upper respiratory specimens during the acute phase of infection. The lowest concentration of SARS-CoV-2 viral copies this assay can detect is 138 copies/mL. A negative result does not preclude SARS-Cov-2 infection and should not be used as the sole basis for treatment or other patient management decisions. A negative result may occur with  improper specimen collection/handling, submission of specimen other than nasopharyngeal swab, presence of viral mutation(s) within the areas targeted by this assay, and  inadequate number of viral copies(<138 copies/mL). A negative result must be combined with clinical observations, patient history, and epidemiological information. The expected result is Negative.  Fact Sheet for Patients:  EntrepreneurPulse.com.au  Fact Sheet for Healthcare Providers:  IncredibleEmployment.be  This test is no t yet approved or cleared by the [place] FDA and  has been authorized for detection and/or diagnosis of SARS-CoV-2 by FDA under an Emergency Use Authorization (EUA). This EUA will remain  in effect (meaning this test can be used) for the duration of the COVID-19 declaration under Section 564(b)(1) of the Act, 21 U.S.C.section 360bbb-3(b)(1), unless the authorization is terminated  or revoked sooner.       Influenza A by PCR NEGATIVE NEGATIVE Final   Influenza B by PCR NEGATIVE NEGATIVE Final    Comment: (NOTE) The Xpert Xpress SARS-CoV-2/FLU/RSV plus assay is intended as an aid in the diagnosis of influenza from Nasopharyngeal swab specimens and should not be used as a sole basis for treatment. Nasal washings and aspirates are unacceptable for Xpert Xpress SARS-CoV-2/FLU/RSV testing.  Fact Sheet for Patients: EntrepreneurPulse.com.au  Fact Sheet for Healthcare Providers: IncredibleEmployment.be  This test is not yet approved or cleared by the [place] FDA and has been authorized for detection and/or diagnosis of SARS-CoV-2 by FDA under an Emergency Use Authorization (EUA). This EUA will remain in effect (meaning this test  can be used) for the duration of the COVID-19 declaration under Section 564(b)(1) of the Act, 21 U.S.C. section 360bbb-3(b)(1), unless the authorization is terminated or revoked.  Performed at [facility], [place] [address] [place], [place] [address]      Labs: Basic Metabolic Panel: Recent Labs  Lab 06/04/21 0016  06/04/21 0550 06/05/21 0528 06/05/21 2216 06/06/21 0219 06/07/21 0115  NA 133* 132* 137  --  138 138  K 3.0* 3.6 2.9* 3.4* 3.4* 3.5  CL 97* 99 108  --  110 111  CO2 21* 20* 19*  --  20* 20*  GLUCOSE 111* 110* 112*  --  107* 104*  BUN 84* 81* 60*  --  45* 30*  CREATININE 9.91* 9.06* 5.41*  --  3.13* 2.07*  CALCIUM 7.7* 7.3* 7.6*  --  8.4* 8.5*  MG  --   --   --  1.3* 1.3*  --   PHOS  --   --   --   --  3.8  --    Liver Function Tests: Recent Labs  Lab 06/04/21 0016 06/05/21 0528 06/06/21 0219 06/07/21 0115  AST 30 23 21 26   ALT 24 19 18 22   ALKPHOS 53 41 37* 40  BILITOT 1.7* 1.7* 1.0 1.0  PROT 8.1 6.1* 5.8* 6.0*  ALBUMIN 3.6 2.7* 2.6* 2.6*   Recent Labs  Lab 06/04/21 0016 06/05/21 0528 06/06/21 0219  LIPASE 636* 398*  --   AMYLASE  --   --  371*   No results for input(s): AMMONIA in the last 168 hours. CBC: Recent Labs  Lab 06/04/21 0016 06/04/21 0550 06/05/21 0528 06/06/21 0219 06/06/21 1645 06/07/21 0115  WBC 9.7 8.3 6.4 5.6  --  6.6  NEUTROABS 7.6  --  4.7 3.4  --  4.3  HGB 9.0* 7.9* 7.2* 6.6* 8.1* 7.7*  HCT 25.5* 22.7* 20.6* 19.4* 23.6* 22.4*  MCV 98.5 100.0 100.0 100.5*  --  96.6  PLT 63* 68* 77* 103*  --  147*   Cardiac Enzymes: No results for input(s): CKTOTAL, CKMB, CKMBINDEX, TROPONINI in the last 168 hours. BNP: BNP (last 3 results) No results for input(s): BNP in the last 8760 hours.  ProBNP (last 3 results) No results for input(s): PROBNP in the last 8760 hours.  CBG: No results for input(s): GLUCAP in the last 168 hours.     Signed:  Nita Sells MD   [facility] 06/07/2021, 10:07 AM

## 2021-06-07 NOTE — Progress Notes (Signed)
Pt ready for DC to home. Will review all instructions with pt.

## 2021-06-07 NOTE — Progress Notes (Signed)
[facility] ROUNDING NOTE   Subjective:   Interval History: Is a 35 year old lady history of severe hypertension anemia history of thoracic descending aortic aneurysm dissection, acute pancreatitis alcohol abuse tobacco abuse.  She presented with acute kidney injury.  In the emergency room she underwent CT scan of chest abdomen pelvis with contrast and without.  She was taking losartan as an outpatient.  Creatinine increased to 9 mg/dL.  Patient now eating and drinking.   Blood pressure 151/97 pulse 85 temperature 98.7 O2 sats 97% room air.  Urine output 400 cc 06/06/2021 status post blood transfusion 06/06/2021  Sodium 138 potassium 3.5 chloride 111 CO2 20 BUN 30 creatinine 2.07 glucose 104 calcium 8.5 hemoglobin 7.7  Objective:  Vital signs in last 24 hours:  Temp:  [97.7 F (36.5 C)-99.6 F (37.6 C)] 98.7 F (37.1 C) (06/23 0612) Pulse Rate:  [82-88] 85 (06/23 0857) Resp:  [16-18] 17 (06/23 0612) BP: (121-151)/(86-99) 151/97 (06/23 0857) SpO2:  [97 %-100 %] 97 % (06/23 0857)  Weight change:  Filed Weights   06/03/21 2200 06/05/21 0431 06/06/21 0629  Weight: 106.6 kg 106.9 kg 109.3 kg    Intake/Output: I/O last 3 completed shifts: In: 1640.8 [P.O.:797; Blood:340; IV Piggyback:503.8] Out: 900 [Urine:900]   Intake/Output this shift:  No intake/output data recorded.  CVS- RRR RS- CTA ABD- BS present soft non-distended EXT- no edema   Basic Metabolic Panel: Recent Labs  Lab 06/04/21 0016 06/04/21 0550 06/05/21 0528 06/05/21 2216 06/06/21 0219 06/07/21 0115  NA 133* 132* 137  --  138 138  K 3.0* 3.6 2.9* 3.4* 3.4* 3.5  CL 97* 99 108  --  110 111  CO2 21* 20* 19*  --  20* 20*  GLUCOSE 111* 110* 112*  --  107* 104*  BUN 84* 81* 60*  --  45* 30*  CREATININE 9.91* 9.06* 5.41*  --  3.13* 2.07*  CALCIUM 7.7* 7.3* 7.6*  --  8.4* 8.5*  MG  --   --   --  1.3* 1.3*  --   PHOS  --   --   --   --  3.8  --      Liver Function Tests: Recent Labs  Lab  06/04/21 0016 06/05/21 0528 06/06/21 0219 06/07/21 0115  AST 30 23 21 26   ALT 24 19 18 22   ALKPHOS 53 41 37* 40  BILITOT 1.7* 1.7* 1.0 1.0  PROT 8.1 6.1* 5.8* 6.0*  ALBUMIN 3.6 2.7* 2.6* 2.6*    Recent Labs  Lab 06/04/21 0016 06/05/21 0528 06/06/21 0219  LIPASE 636* 398*  --   AMYLASE  --   --  371*    No results for input(s): AMMONIA in the last 168 hours.  CBC: Recent Labs  Lab 06/04/21 0016 06/04/21 0550 06/05/21 0528 06/06/21 0219 06/06/21 1645 06/07/21 0115  WBC 9.7 8.3 6.4 5.6  --  6.6  NEUTROABS 7.6  --  4.7 3.4  --  4.3  HGB 9.0* 7.9* 7.2* 6.6* 8.1* 7.7*  HCT 25.5* 22.7* 20.6* 19.4* 23.6* 22.4*  MCV 98.5 100.0 100.0 100.5*  --  96.6  PLT 63* 68* 77* 103*  --  147*     Cardiac Enzymes: No results for input(s): CKTOTAL, CKMB, CKMBINDEX, TROPONINI in the last 168 hours.  BNP: Invalid input(s): POCBNP  CBG: No results for input(s): GLUCAP in the last 168 hours.  Microbiology: Results for orders placed or performed during the hospital encounter of 06/03/21  Resp Panel by RT-PCR (Flu  A&B, Covid) Nasopharyngeal Swab     Status: None   Collection Time: 06/04/21  2:47 AM   Specimen: Nasopharyngeal Swab; Nasopharyngeal(NP) swabs in vial transport medium  Result Value Ref Range Status   SARS Coronavirus 2 by RT PCR NEGATIVE NEGATIVE Final    Comment: (NOTE) SARS-CoV-2 target nucleic acids are NOT DETECTED.  The SARS-CoV-2 RNA is generally detectable in upper respiratory specimens during the acute phase of infection. The lowest concentration of SARS-CoV-2 viral copies this assay can detect is 138 copies/mL. A negative result does not preclude SARS-Cov-2 infection and should not be used as the sole basis for treatment or other patient management decisions. A negative result may occur with  improper specimen collection/handling, submission of specimen other than nasopharyngeal swab, presence of viral mutation(s) within the areas targeted by this assay,  and inadequate number of viral copies(<138 copies/mL). A negative result must be combined with clinical observations, patient history, and epidemiological information. The expected result is Negative.  Fact Sheet for Patients:  EntrepreneurPulse.com.au  Fact Sheet for Healthcare Providers:  IncredibleEmployment.be  This test is no t yet approved or cleared by the [place] FDA and  has been authorized for detection and/or diagnosis of SARS-CoV-2 by FDA under an Emergency Use Authorization (EUA). This EUA will remain  in effect (meaning this test can be used) for the duration of the COVID-19 declaration under Section 564(b)(1) of the Act, 21 U.S.C.section 360bbb-3(b)(1), unless the authorization is terminated  or revoked sooner.       Influenza A by PCR NEGATIVE NEGATIVE Final   Influenza B by PCR NEGATIVE NEGATIVE Final    Comment: (NOTE) The Xpert Xpress SARS-CoV-2/FLU/RSV plus assay is intended as an aid in the diagnosis of influenza from Nasopharyngeal swab specimens and should not be used as a sole basis for treatment. Nasal washings and aspirates are unacceptable for Xpert Xpress SARS-CoV-2/FLU/RSV testing.  Fact Sheet for Patients: EntrepreneurPulse.com.au  Fact Sheet for Healthcare Providers: IncredibleEmployment.be  This test is not yet approved or cleared by the [place] FDA and has been authorized for detection and/or diagnosis of SARS-CoV-2 by FDA under an Emergency Use Authorization (EUA). This EUA will remain in effect (meaning this test can be used) for the duration of the COVID-19 declaration under Section 564(b)(1) of the Act, 21 U.S.C. section 360bbb-3(b)(1), unless the authorization is terminated or revoked.  Performed at [facility], [place] [address], [place], [place] [address]     Coagulation Studies: No results for input(s): LABPROT, INR in the last  72 hours.  Urinalysis: No results for input(s): COLORURINE, LABSPEC, PHURINE, GLUCOSEU, HGBUR, BILIRUBINUR, KETONESUR, PROTEINUR, UROBILINOGEN, NITRITE, LEUKOCYTESUR in the last 72 hours.  Invalid input(s): APPERANCEUR     Imaging: No results found.   Medications:    sodium chloride 50 mL/hr at 06/07/21 0854    amLODipine  5 mg Oral Daily   carvedilol  25 mg Oral BID WC   cloNIDine  0.1 mg Oral TID   COVID-19 mRNA Vac-TriS (Pfizer)  0.3 mL Intramuscular Once   folic acid  1 mg Oral Daily   heparin  5,000 Units Subcutaneous Q8H   hydrALAZINE  75 mg Oral Q8H   prazosin  2 mg Oral QHS   thiamine  100 mg Oral Daily   acetaminophen, HYDROmorphone (DILAUDID) injection, labetalol, ondansetron (ZOFRAN) IV, oxyCODONE  Assessment/ Plan:  AKI on CKD 3A - b/l creat from May 2022 was 1.5, eGFR 47 ml/min. Creat here 9.0 in setting of significant abd  pain, indigestion and anorexia. Pt is dehydrated and has been taking losartan as usual. Suspect this is primary cause of AKI.  Improved with decreased creatinine.  Increased urine output. 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 HTN- severe, hx malignant.  Taking all her meds.   Improved Abd pain / [place] Dura - c/w pancreatitis Thrombocytopenia - hx of same w/ pancreatitis episode last year Volume -IV fluids discontinued patient appears to be eating Hypokalemia would recommend repletion Anemia status post transfusion.  Hemoglobin 7.7 this morning suggests ongoing blood loss.    LOS: [place] @TODAY @9 :36 AM

## 2021-06-12 ENCOUNTER — Other Ambulatory Visit: Payer: Self-pay

## 2021-06-12 ENCOUNTER — Encounter: Payer: Self-pay | Admitting: Vascular Surgery

## 2021-06-12 ENCOUNTER — Ambulatory Visit (INDEPENDENT_AMBULATORY_CARE_PROVIDER_SITE_OTHER): Payer: Medicaid Other | Admitting: Vascular Surgery

## 2021-06-12 VITALS — BP 132/91 | HR 106 | Temp 98.9°F | Resp 20 | Ht 68.0 in | Wt 240.0 lb

## 2021-06-12 DIAGNOSIS — I7101 Dissection of thoracic aorta: Secondary | ICD-10-CM

## 2021-06-12 DIAGNOSIS — I71019 Dissection of thoracic aorta, unspecified: Secondary | ICD-10-CM

## 2021-06-12 NOTE — Progress Notes (Signed)
   ASSESSMENT & PLAN:  Robin Guerrero is a 35 y.o. female with stable type B aortic dissection.  Continue medical management with goal systolic blood pressure less than 130.  Goal heart rate less than 80.  Continue annual CTA surveillance to ensure no aneurysmal degeneration. Follow up with me or PA in 1 year with repeat CTA.    She is interested in a breast reduction, and has questions about the dissection as relates to getting pregnant.  I counseled her that elective repair of this dissection would carry small but real risk of stroke and spinal cord ischemia (less than 3 to 5%).  I would only suggest repair if she was interested in becoming pregnant or another surgeon felt it necessary to do prior to an elective procedure.  The only benefit of repair would be in improving the chance of remodeling and reducing her potential risk of aneurysmal degeneration.  She would like to think about it and will call to discuss this further if and when she needs it.  SUBJECTIVE:  Admitted to hospital 06/03/2021 to 06/07/2021 for acute renal failure attributed to acute on chronic pancreatitis.  Given her clinical history a CT angiogram of chest abdomen pelvis was performed.  This shows findings similar to her previous scans.  I personally reviewed the CT angiogram from 06/04/2021.  OBJECTIVE:  BP (!) 132/91 (BP Location: Left Arm, Patient Position: Sitting, Cuff Size: Large)   Pulse (!) 106   Temp 98.9 F (37.2 C)   Resp 20   Ht 5\' 8"  (1.727 m)   Wt 240 lb (108.9 kg)   SpO2 95%   BMI 36.49 kg/m   Constitutional: well appearing. no acute distress. Cardiac: RRR. Vascular: 2+ DPs Pulmonary: unlabored Abdominal: soft  CBC Latest Ref Rng & Units 06/07/2021 06/06/2021 06/06/2021  WBC 4.0 - 10.5 K/uL 6.6 - 5.6  Hemoglobin 12.0 - 15.0 g/dL 7.7(L) 8.1(L) 6.6(LL)  Hematocrit 36.0 - 46.0 % 22.4(L) 23.6(L) 19.4(L)  Platelets 150 - 400 K/uL 147(L) - 103(L)     CMP Latest Ref Rng & Units 06/07/2021 06/06/2021  06/05/2021  Glucose 70 - 99 mg/dL 104(H) 107(H) -  BUN 6 - 20 mg/dL 30(H) 45(H) -  Creatinine 0.44 - 1.00 mg/dL 2.07(H) 3.13(H) -  Sodium 135 - 145 mmol/L 138 138 -  Potassium 3.5 - 5.1 mmol/L 3.5 3.4(L) 3.4(L)  Chloride 98 - 111 mmol/L 111 110 -  CO2 22 - 32 mmol/L 20(L) 20(L) -  Calcium 8.9 - 10.3 mg/dL 8.5(L) 8.4(L) -  Total Protein 6.5 - 8.1 g/dL 6.0(L) 5.8(L) -  Total Bilirubin 0.3 - 1.2 mg/dL 1.0 1.0 -  Alkaline Phos 38 - 126 U/L 40 37(L) -  AST 15 - 41 U/L 26 21 -  ALT 0 - 44 U/L 22 18 -    Estimated Creatinine Clearance: 49 mL/min (A) (by C-G formula based on SCr of 2.07 mg/dL (H)).  CTA personally reviewed - no interval change in TBAD  Robin Guerrero. Stanford Breed, MD [facility] Phone Number: [phone] 06/12/2021 2:52 PM

## 2021-06-12 NOTE — Progress Notes (Deleted)
Patient referred by Trey Sailors, PA for hypertension, h/o type B dissection  Subjective:   Robin Guerrero, female    DOB: 20-Jul-1986, 35 y.o.   MRN: [id number]   No chief complaint on file.    HPI  35 y.o. African American female with hypertension, h/o medically treated type B aortic dissection (10/2020), prior h/o EtOH, tobacco abuse.  Patient presented to [facility] emergency department 04/26/2021 with complaints of abdominal and chest pain, ACS was ruled out and CTA revealed stable aortic dissection therefore patient was discharged home.She was then hospitalized 06/03/2021 - 06/07/2021 with acute reanl failure and mild pancreatitis.  AKI likely secondary to dehydration and continued use of losartan.  Hydrochlorothiazide and losartan were discontinued upon discharge and amlodipine was increased given need for strict blood pressure control.  ***Patient now presents for follow up.   ***  Patient was hospitalized in 10/2020 with complaints of nausea, abdominal pain, and was found to have large type B acute aortic dissection.  Over the next 10 days, patient was managed medically, with control of hypertension. Patient's blood pressure is now well controlled on multiple medications.  Patient endorses noncompliance with medications prior to her dissection.  She has history of tobacco and alcohol abuse, but is trying to stay clean since then.  Patient reports that she still has occasional shoulder and back pains, but they are no different compared to before and have never completely gone away since her hospitalization. Subsequently, patient has undergone follow-up CT scan that showed grossly unchanged type B dissection, details below.  On a separate note, patient reports episodes of numbness in both her arms, worse with certain movement.  She also reports having pain in both her hands, especially while at work, where she works on [employer].   Past Medical History:  Diagnosis Date    Descending thoracic dissection ([place])    History of anemia    no current med.   Hypertension    states under control with med., has been on med. x 3 yr.   Past Surgical History:  Procedure Laterality Date   FOREIGN BODY REMOVAL Left 02/10/2015   Procedure: ATTEMPTED EXPLANTATION OF BIRTH CONTROL DEVICE LEFT ARM;  Surgeon: Johnathan Hausen, MD;  Location: [facility];  Service: General;  Laterality: Left;   FOREIGN BODY REMOVAL Left 05/12/2015   Procedure: REMOVAL OF LEFT ARM BIRTH CONTROL DEVICE;  Surgeon: Johnathan Hausen, MD;  Location: [facility];  Service: General;  Laterality: Left;   SUBMANDIBULAR GLAND EXCISION Right 05/01/2009   Social History   Tobacco Use  Smoking Status Some Days   Packs/day: 0.25   Years: 5.00   Pack years: 1.25   Types: Cigarettes  Smokeless Tobacco Never  Tobacco Comments   1 pack/week    Social History   Substance and Sexual Activity  Alcohol Use Yes   Comment: occasionally  Marital status: Single  Family History  Problem Relation Age of Onset   Breast cancer Mother 74   Heart disease Mother    Hypertension Mother      Current Outpatient Medications on File Prior to Visit  Medication Sig Dispense Refill   acetaminophen (TYLENOL) 325 MG tablet Take 650 mg by mouth every 6 (six) hours as needed for mild pain, fever or headache.     amLODipine (NORVASC) 10 MG tablet Take 1 tablet (10 mg total) by mouth daily. 30 tablet 2   carvedilol (COREG) 25 MG tablet Take 1 tablet (25 mg total)  by mouth 2 (two) times daily with a meal. 60 tablet 0   cloNIDine (CATAPRES) 0.1 MG tablet Take 1 tablet (0.1 mg total) by mouth 3 (three) times daily. 90 tablet 0   dicyclomine (BENTYL) 20 MG tablet Take 1 tablet (20 mg total) by mouth 2 (two) times daily. (Patient not taking: Reported on 06/04/2021) 20 tablet 0   ferrous sulfate 325 (65 FE) MG tablet Take 1 tablet (325 mg total) by mouth 2 (two) times daily with a meal. 30 tablet 3    hydrALAZINE (APRESOLINE) 50 MG tablet TAKE 1 AND 1/2 TABLETS (75 MG TOTAL) BY MOUTH EVERY EIGHT HOURS. 135 tablet 0   Multiple Vitamin (MULTIVITAMIN WITH MINERALS) TABS tablet Take 1 tablet by mouth daily. 30 tablet 0   prazosin (MINIPRESS) 2 MG capsule Take 1 capsule (2 mg total) by mouth at bedtime. 30 capsule 3   QUEtiapine (SEROQUEL) 25 MG tablet TAKE 1 TABLET (25 MG TOTAL) BY MOUTH TWO TIMES DAILY. 60 tablet 0   thiamine 100 MG tablet TAKE 1 TABLET (100 MG TOTAL) BY MOUTH DAILY. 30 tablet 0   No current facility-administered medications on file prior to visit.    Cardiovascular and other pertinent studies:  CT angiogram 06/04/2021: 1. 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 No acute complication. 2. Trace free fluid and fat stranding surrounding the splenic flexure and gastric greater curvature. Given close proximity to the tail of the pancreas, consider further evaluation with lipase levels for acute pancreatitis. 3. Uterine fibroids.  CT angiogram 04/26/2021: Stable type B aortic dissection beginning just beyond the origin of the left subclavian artery and continuing into the right common iliac artery. 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 Bibasilar scarring. No acute findings in the abdomen or pelvis. Fibroid uterus.  EKG 02/12/2021: Sinus tachycardia 103 bpm Poor R wave progression Nonspecific T-abnormality  CT angiogram 12/11/2020: 1. Grossly unchanged appearance of known type B descending thoracic aortic dissection extending throughout the normal caliber abdominal aorta to the level of the bifurcation of the right common iliac artery. The dissection does not involve the branch vessels of the aortic arch or result in a hemodynamically significant narrowing  of any of the mesenteric branches. 2. 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 The remainder of the mesenteric branch vessels arise from the true lumen. 3. No evidence of ascending thoracic aortic aneurysm or dissection on this nongated examination.  Recent labs: 12/29/2020: Glucose 116, BUN/Cr 18/1.26. EGFR 64. Na/K 142/3.7. Rest of the CMP normal H/H 8.7/28.7. MCV 89. Platelets 571  CMP Latest Ref Rng & Units 06/07/2021 06/06/2021 06/05/2021  Glucose 70 - 99 mg/dL 104(H) 107(H) -  BUN 6 - 20 mg/dL 30(H) 45(H) -  Creatinine 0.44 - 1.00 mg/dL 2.07(H) 3.13(H) -  Sodium 135 - 145 mmol/L 138 138 -  Potassium 3.5 - 5.1 mmol/L 3.5 3.4(L) 3.4(L)  Chloride 98 - 111 mmol/L 111 110 -  CO2 22 - 32 mmol/L 20(L) 20(L) -  Calcium 8.9 - 10.3 mg/dL 8.5(L) 8.4(L) -  Total Protein 6.5 - 8.1 g/dL 6.0(L) 5.8(L) -  Total Bilirubin 0.3 - 1.2 mg/dL 1.0 1.0 -  Alkaline Phos 38 - 126 U/L 40 37(L) -  AST 15 - 41 U/L 26 21 -  ALT 0 - 44 U/L 22 18 -  CBC Latest Ref Rng & Units 06/07/2021 06/06/2021 06/06/2021  WBC 4.0 - 10.5 K/uL 6.6 - 5.6  Hemoglobin 12.0 - 15.0 g/dL 7.7(L) 8.1(L) 6.6(LL)  Hematocrit 36.0 - 46.0 % 22.4(L) 23.6(L) 19.4(L)  Platelets 150 - 400 K/uL 147(L) - 103(L)   Lipid Panel  No results found for: CHOL, TRIG, HDL, CHOLHDL, VLDL, LDLCALC, LDLDIRECT HEMOGLOBIN A1C No results found for: HGBA1C, MPG TSH Recent Labs    08/27/20 0953  TSH 0.974    Review of Systems  Cardiovascular:  Negative for chest pain, dyspnea on exertion, leg swelling, palpitations and syncope.  Musculoskeletal:  Positive for back pain and stiffness.  Neurological:  Positive for paresthesias.        There were no vitals filed for this visit.    There is no height or weight on file to calculate BMI. There were no vitals filed for this visit.    Objective:   Physical Exam Vitals and nursing note reviewed.  Constitutional:      General: She is not in acute distress. Neck:      Vascular: No JVD.  Cardiovascular:     Rate and Rhythm: Normal rate and regular rhythm.     Pulses: Normal pulses.     Heart sounds: Normal heart sounds. No murmur heard. Pulmonary:     Effort: Pulmonary effort is normal.     Breath sounds: Normal breath sounds. No wheezing or rales.  Musculoskeletal:     Right lower leg: No edema.     Left lower leg: No edema.         Assessment & Recommendations:   35 y.o. African American female with hypertension, h/o medically treated type B aortic dissection, prior h/o EtOH, tobacco abuse. *** Hypertension: Controlled.  No change made today to current antihypertensive therapy. It is rather difficult to wean her off her medications without risking acute increase in blood pressure. Will check echocardiogram.   Type B aortic dissection: Acute episode in November 2021, with grossly unchanged CT scan thereafter. No known family h/o aortic syndrome. Patient has two kids. I will refer her to Dr. Lattie Corns for genetic counseling.  Continue follow-up with vascular surgery.  Paresthesias: I suspect she may have neuropathy in her bilateral upper extremities, either related to prior use of excess alcohol, or related to her work ([employer]). Consider neurology referral. Defer to PCP.   CKD Stage 3A:    Thank you for referring the patient to [place]. Please feel free to contact with any questions.   Alethia Berthold, PA-C 06/12/2021, 10:34 AM Office: [phone]

## 2021-06-13 ENCOUNTER — Ambulatory Visit: Payer: Medicaid Other | Admitting: Student

## 2021-06-18 ENCOUNTER — Other Ambulatory Visit: Payer: Self-pay | Admitting: Obstetrics and Gynecology

## 2021-06-19 ENCOUNTER — Ambulatory Visit: Payer: Medicaid Other | Admitting: Vascular Surgery

## 2021-07-05 ENCOUNTER — Other Ambulatory Visit: Payer: Medicaid Other

## 2021-08-17 ENCOUNTER — Ambulatory Visit: Payer: Medicaid Other | Admitting: Cardiology

## 2021-08-19 ENCOUNTER — Other Ambulatory Visit: Payer: Self-pay | Admitting: Physician Assistant

## 2021-08-19 DIAGNOSIS — I71012 Dissection of descending thoracic aorta: Secondary | ICD-10-CM

## 2021-08-19 DIAGNOSIS — I7101 Dissection of thoracic aorta: Secondary | ICD-10-CM

## 2021-09-04 ENCOUNTER — Other Ambulatory Visit: Payer: Self-pay | Admitting: Vascular Surgery

## 2021-09-04 DIAGNOSIS — I7101 Dissection of thoracic aorta: Secondary | ICD-10-CM

## 2021-09-04 DIAGNOSIS — I71012 Dissection of descending thoracic aorta: Secondary | ICD-10-CM

## 2021-09-13 ENCOUNTER — Ambulatory Visit: Payer: Self-pay | Admitting: Cardiology

## 2021-09-13 NOTE — Progress Notes (Signed)
No show

## 2021-09-19 ENCOUNTER — Telehealth: Payer: Self-pay | Admitting: Cardiology

## 2021-09-19 NOTE — Telephone Encounter (Signed)
pt has cancelled and no showed on several occasions.

## 2021-11-11 ENCOUNTER — Ambulatory Visit
Admission: EM | Admit: 2021-11-11 | Discharge: 2021-11-11 | Disposition: A | Discharge status: Home / Self Care | Payer: 59 | Attending: Physician Assistant | Admitting: Physician Assistant

## 2021-11-11 ENCOUNTER — Encounter: Payer: Self-pay | Admitting: *Deleted

## 2021-11-11 ENCOUNTER — Other Ambulatory Visit: Payer: Self-pay

## 2021-11-11 DIAGNOSIS — J069 Acute upper respiratory infection, unspecified: Secondary | ICD-10-CM | POA: Diagnosis not present

## 2021-11-11 DIAGNOSIS — R051 Acute cough: Secondary | ICD-10-CM | POA: Diagnosis not present

## 2021-11-11 DIAGNOSIS — Z20822 Contact with and (suspected) exposure to covid-19: Secondary | ICD-10-CM

## 2021-11-11 NOTE — ED Triage Notes (Signed)
C/O HA, congestion, sneezing, not flashes, cough, body aches, hoarse voice, sore throat onset yesterday.  Denies any known fevers.

## 2021-11-11 NOTE — ED Provider Notes (Signed)
[facility]    CSN: [id number] Arrival date & time: 11/11/21  1228      History   Chief Complaint Chief Complaint  Patient presents with   Cough   Headache    HPI Robin Guerrero is a 35 y.o. female.   Patient here today for evaluation of nasal congestion and drainage, body aches, sore throat, ear pain and headaches that started yesterday. She does not report fever. Her children have recently been sick with similar symptoms. She has taken some OTC cough medication with mild relief.   The history is provided by the patient.  Cough Associated symptoms: headaches and sore throat   Associated symptoms: no chills, no ear pain, no eye discharge, no fever, no shortness of breath and no wheezing   Headache Associated symptoms: congestion, cough, sinus pressure and sore throat   Associated symptoms: no abdominal pain, no diarrhea, no ear pain, no fever, no nausea and no vomiting    Past Medical History:  Diagnosis Date   Descending thoracic dissection    History of anemia    no current med.   Hypertension    states under control with med., has been on med. x 3 yr.    Patient Active Problem List   Diagnosis Date Noted   Acute renal failure superimposed on stage 3a chronic kidney disease ([place]) 06/05/2021   Essential hypertension 06/05/2021   ARF (acute renal failure) (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   Malignant hypertension    Nausea    Back pain    Hypocalcemia 77/82/4235   Acute alcoholic pancreatitis 36/14/4315   Thrombocytopenia ([place]) 08/19/2020   Normocytic anemia 40/07/6760   Alcoholic pancreatitis 95/08/3266   SIRS (systemic inflammatory response syndrome) ([place]) 08/18/2020   Hypokalemia 08/18/2020   Hypomagnesemia 08/18/2020   Prolonged QT interval 08/18/2020   Primary hypertension 08/18/2020   Depression 08/18/2020   Tobacco use 08/18/2020    Past  Surgical History:  Procedure Laterality Date   FOREIGN BODY REMOVAL Left 02/10/2015   Procedure: ATTEMPTED EXPLANTATION OF BIRTH CONTROL DEVICE LEFT ARM;  Surgeon: Johnathan Hausen, MD;  Location: [place];  Service: General;  Laterality: Left;   FOREIGN BODY REMOVAL Left 05/12/2015   Procedure: REMOVAL OF LEFT ARM BIRTH CONTROL DEVICE;  Surgeon: Johnathan Hausen, MD;  Location: [place];  Service: General;  Laterality: Left;   SUBMANDIBULAR GLAND EXCISION Right 05/01/2009    OB History     Gravida  4   Para  2   Term  2   Preterm      AB  2   Living  2      SAB      IAB  2   Ectopic      Multiple      Live Births  2            Home Medications    Prior to Admission medications   Medication Sig Start Date End Date Taking? Authorizing Provider  amLODipine (NORVASC) 10 MG tablet Take 1 tablet (10 mg total) by mouth daily. 06/08/21  Yes Nita Sells, MD  carvedilol (COREG) 25 MG tablet Take 1 tablet (25 mg total) by mouth 2 (two) times daily with a meal. 12/18/20  Yes Ulyses Amor, PA-C  cloNIDine (CATAPRES) 0.1 MG tablet Take 1 tablet (0.1 mg total) by mouth 3 (three) times daily. 12/18/20  Yes Ulyses Amor,  PA-C  ferrous sulfate 325 (65 FE) MG tablet Take 1 tablet (325 mg total) by mouth 2 (two) times daily with a meal. 06/07/21  Yes Samtani, Jai-Gurmukh, MD  hydrALAZINE (APRESOLINE) 50 MG tablet TAKE 1 AND 1/2 TABLETS (75 MG TOTAL) BY MOUTH EVERY EIGHT HOURS. 11/17/20 11/17/21 Yes Allie Bossier, MD  Multiple Vitamin (MULTIVITAMIN WITH MINERALS) TABS tablet Take 1 tablet by mouth daily. 11/18/20  Yes Allie Bossier, MD  prazosin (MINIPRESS) 2 MG capsule Take 1 capsule (2 mg total) by mouth at bedtime. 06/07/21  Yes Nita Sells, MD  QUEtiapine (SEROQUEL) 25 MG tablet TAKE 1 TABLET (25 MG TOTAL) BY MOUTH TWO TIMES DAILY. 11/17/20 11/17/21 Yes Allie Bossier, MD  acetaminophen (TYLENOL) 325 MG tablet Take 650 mg by mouth every  6 (six) hours as needed for mild pain, fever or headache.    [provider]  dicyclomine (BENTYL) 20 MG tablet Take 1 tablet (20 mg total) by mouth 2 (two) times daily. Patient not taking: Reported on 06/04/2021 11/08/20   Larene Pickett, PA-C  hydrochlorothiazide (HYDRODIURIL) 50 MG tablet TAKE 1 TABLET BY MOUTH EVERY DAY 09/04/21   Waynetta Sandy, MD  thiamine 100 MG tablet TAKE 1 TABLET (100 MG TOTAL) BY MOUTH DAILY. 11/17/20 11/17/21  Allie Bossier, MD    Family History Family History  Problem Relation Age of Onset   Breast cancer Mother 23   Heart disease Mother    Hypertension Mother     Social History Social History   Tobacco Use   Smoking status: Some Days    Packs/day: 0.25    Years: 5.00    Pack years: 1.25    Types: Cigarettes   Smokeless tobacco: Never   Tobacco comments:    1 pack/week  Vaping Use   Vaping Use: Never used  Substance Use Topics   Alcohol use: Yes    Comment: occasionally   Drug use: No     Allergies   Ativan [lorazepam]   Review of Systems Review of Systems  Constitutional:  Negative for chills and fever.  HENT:  Positive for congestion, sinus pressure, sore throat and voice change. Negative for ear pain.   Eyes:  Negative for discharge and redness.  Respiratory:  Positive for cough. Negative for shortness of breath and wheezing.   Gastrointestinal:  Negative for abdominal pain, diarrhea, nausea and vomiting.  Neurological:  Positive for headaches.    Physical Exam Triage Vital Signs ED Triage Vitals  Enc Vitals Group     BP 11/11/21 1513 (!) 149/100     Pulse Rate 11/11/21 1513 99     Resp 11/11/21 1513 (!) 22     Temp 11/11/21 1513 98.4 F (36.9 C)     Temp Source 11/11/21 1513 Oral     SpO2 11/11/21 1513 98 %     Weight --      Height --      Head Circumference --      Peak Flow --      Pain Score 11/11/21 1515 7     Pain Loc --      Pain Edu? --      Excl. in [place]? --    No data found.  Updated  Vital Signs BP (!) 149/100 Comment: states she is due for her next dose of HTN med  Pulse 99   Temp 98.4 F (36.9 C) (Oral)   Resp (!) 22   LMP 11/07/2021 (Approximate)  SpO2 98%   Breastfeeding No   Physical Exam Vitals and nursing note reviewed.  Constitutional:      General: She is not in acute distress.    Appearance: Normal appearance. She is not ill-appearing.  HENT:     Head: Normocephalic and atraumatic.     Right Ear: Tympanic membrane normal.     Left Ear: Tympanic membrane normal.     Nose: Congestion present.     Mouth/Throat:     Mouth: Mucous membranes are moist.     Pharynx: No oropharyngeal exudate or posterior oropharyngeal erythema.     Comments: Hoarse voice Eyes:     Conjunctiva/sclera: Conjunctivae normal.  Cardiovascular:     Rate and Rhythm: Normal rate and regular rhythm.     Heart sounds: Normal heart sounds. No murmur heard. Pulmonary:     Effort: Pulmonary effort is normal. No respiratory distress.     Breath sounds: Normal breath sounds. No wheezing, rhonchi or rales.  Skin:    General: Skin is warm and dry.  Neurological:     Mental Status: She is alert.  Psychiatric:        Mood and Affect: Mood normal.        Thought Content: Thought content normal.     UC Treatments / Results  Labs (all labs ordered are listed, but only abnormal results are displayed) Labs Reviewed  COVID-19, FLU A+B NAA    EKG   Radiology No results found.  Procedures Procedures (including critical care time)  Medications Ordered in UC Medications - No data to display  Initial Impression / Assessment and Plan / UC Course  I have reviewed the triage vital signs and the nursing notes.  Pertinent labs & imaging results that were available during my care of the patient were reviewed by me and considered in my medical decision making (see chart for details).    Suspect viral etiology of symptoms. Recommend screening for covid and flu. Encouraged  symptomatic treatment, increased fluids and rest. Encouraged follow up if symptoms fail to improve or worsen.   Final Clinical Impressions(s) / UC Diagnoses   Final diagnoses:  Acute cough  Encounter for laboratory testing for COVID-19 virus   Discharge Instructions   None    ED Prescriptions   None    PDMP not reviewed this encounter.   Francene Finders, PA-C 11/11/21 1541

## 2021-11-12 LAB — COVID-19, FLU A+B NAA
Influenza A, NAA: NOT DETECTED
Influenza B, NAA: NOT DETECTED
SARS-CoV-2, NAA: NOT DETECTED

## 2021-11-25 ENCOUNTER — Other Ambulatory Visit: Payer: Self-pay | Admitting: Obstetrics and Gynecology

## 2022-02-20 ENCOUNTER — Inpatient Hospital Stay (HOSPITAL_COMMUNITY)
Admission: EM | Admit: 2022-02-20 | Discharge: 2022-02-24 | DRG: 300 | Disposition: A | Discharge status: Home / Self Care | Payer: 59 | Attending: Internal Medicine | Admitting: Internal Medicine

## 2022-02-20 ENCOUNTER — Emergency Department (HOSPITAL_COMMUNITY): Payer: 59

## 2022-02-20 ENCOUNTER — Inpatient Hospital Stay (HOSPITAL_COMMUNITY): Payer: 59

## 2022-02-20 DIAGNOSIS — D649 Anemia, unspecified: Secondary | ICD-10-CM | POA: Diagnosis present

## 2022-02-20 DIAGNOSIS — I161 Hypertensive emergency: Secondary | ICD-10-CM | POA: Diagnosis present

## 2022-02-20 DIAGNOSIS — F17210 Nicotine dependence, cigarettes, uncomplicated: Secondary | ICD-10-CM | POA: Diagnosis present

## 2022-02-20 DIAGNOSIS — Z888 Allergy status to other drugs, medicaments and biological substances status: Secondary | ICD-10-CM | POA: Diagnosis not present

## 2022-02-20 DIAGNOSIS — Z20822 Contact with and (suspected) exposure to covid-19: Secondary | ICD-10-CM | POA: Diagnosis present

## 2022-02-20 DIAGNOSIS — M25512 Pain in left shoulder: Secondary | ICD-10-CM | POA: Diagnosis present

## 2022-02-20 DIAGNOSIS — K861 Other chronic pancreatitis: Secondary | ICD-10-CM | POA: Diagnosis present

## 2022-02-20 DIAGNOSIS — Z6835 Body mass index (BMI) 35.0-35.9, adult: Secondary | ICD-10-CM

## 2022-02-20 DIAGNOSIS — Z79899 Other long term (current) drug therapy: Secondary | ICD-10-CM

## 2022-02-20 DIAGNOSIS — I129 Hypertensive chronic kidney disease with stage 1 through stage 4 chronic kidney disease, or unspecified chronic kidney disease: Secondary | ICD-10-CM | POA: Diagnosis present

## 2022-02-20 DIAGNOSIS — I7102 Dissection of abdominal aorta: Secondary | ICD-10-CM

## 2022-02-20 DIAGNOSIS — I7100 Dissection of unspecified site of aorta: Secondary | ICD-10-CM | POA: Diagnosis present

## 2022-02-20 DIAGNOSIS — I10 Essential (primary) hypertension: Secondary | ICD-10-CM

## 2022-02-20 DIAGNOSIS — Z66 Do not resuscitate: Secondary | ICD-10-CM | POA: Diagnosis present

## 2022-02-20 DIAGNOSIS — N1831 Chronic kidney disease, stage 3a: Secondary | ICD-10-CM | POA: Diagnosis present

## 2022-02-20 DIAGNOSIS — E669 Obesity, unspecified: Secondary | ICD-10-CM | POA: Diagnosis present

## 2022-02-20 DIAGNOSIS — E876 Hypokalemia: Secondary | ICD-10-CM | POA: Diagnosis not present

## 2022-02-20 DIAGNOSIS — K8681 Exocrine pancreatic insufficiency: Secondary | ICD-10-CM | POA: Diagnosis present

## 2022-02-20 DIAGNOSIS — I7101 Dissection of ascending aorta: Principal | ICD-10-CM | POA: Diagnosis present

## 2022-02-20 DIAGNOSIS — Z8249 Family history of ischemic heart disease and other diseases of the circulatory system: Secondary | ICD-10-CM

## 2022-02-20 DIAGNOSIS — I1A0 Resistant hypertension: Secondary | ICD-10-CM

## 2022-02-20 DIAGNOSIS — I71012 Dissection of descending thoracic aorta: Secondary | ICD-10-CM

## 2022-02-20 LAB — COMPREHENSIVE METABOLIC PANEL
ALT: 14 U/L (ref 0–44)
AST: 25 U/L (ref 15–41)
Albumin: 3.4 g/dL — ABNORMAL LOW (ref 3.5–5.0)
Alkaline Phosphatase: 51 U/L (ref 38–126)
Anion gap: 7 (ref 5–15)
BUN: 11 mg/dL (ref 6–20)
CO2: 24 mmol/L (ref 22–32)
Calcium: 8.5 mg/dL — ABNORMAL LOW (ref 8.9–10.3)
Chloride: 107 mmol/L (ref 98–111)
Creatinine, Ser: 0.73 mg/dL (ref 0.44–1.00)
GFR, Estimated: >60 mL/min (ref >60)
Glucose, Bld: 159 mg/dL — ABNORMAL HIGH (ref 70–99)
Potassium: 4.1 mmol/L (ref 3.5–5.1)
Sodium: 138 mmol/L (ref 135–145)
Total Bilirubin: 1.9 mg/dL — ABNORMAL HIGH (ref 0.3–1.2)
Total Protein: 7.5 g/dL (ref 6.5–8.1)

## 2022-02-20 LAB — RESP PANEL BY RT-PCR (FLU A&B, COVID) ARPGX2
Influenza A by PCR: NEGATIVE
Influenza B by PCR: NEGATIVE
SARS Coronavirus 2 by RT PCR: NEGATIVE

## 2022-02-20 LAB — CBC WITH DIFFERENTIAL/PLATELET
Abs Immature Granulocytes: 0.04 10*3/uL (ref 0.00–0.07)
Basophils Absolute: 0 10*3/uL (ref 0.0–0.1)
Basophils Relative: 0 %
Eosinophils Absolute: 0.1 10*3/uL (ref 0.0–0.5)
Eosinophils Relative: 1 %
HCT: 33.7 % — ABNORMAL LOW (ref 36.0–46.0)
Hemoglobin: 10.8 g/dL — ABNORMAL LOW (ref 12.0–15.0)
Immature Granulocytes: 1 %
Lymphocytes Relative: 11 %
Lymphs Abs: 1 10*3/uL (ref 0.7–4.0)
MCH: 31.5 pg (ref 26.0–34.0)
MCHC: 32 g/dL (ref 30.0–36.0)
MCV: 98.3 fL (ref 80.0–100.0)
Monocytes Absolute: 0.6 10*3/uL (ref 0.1–1.0)
Monocytes Relative: 7 %
Neutro Abs: 6.9 10*3/uL (ref 1.7–7.7)
Neutrophils Relative %: 80 %
Platelets: 250 10*3/uL (ref 150–400)
RBC: 3.43 MIL/uL — ABNORMAL LOW (ref 3.87–5.11)
RDW: 14.9 % (ref 11.5–15.5)
WBC: 8.6 10*3/uL (ref 4.0–10.5)
nRBC: 0 % (ref 0.0–0.2)

## 2022-02-20 LAB — HCG, SERUM, QUALITATIVE: Preg, Serum: NEGATIVE

## 2022-02-20 LAB — HIV ANTIBODY (ROUTINE TESTING W REFLEX): HIV Screen 4th Generation wRfx: NONREACTIVE

## 2022-02-20 MED ORDER — ACETAMINOPHEN 325 MG PO TABS
650.0000 mg | ORAL_TABLET | Freq: Four times a day (QID) | ORAL | Status: DC | PRN
  Administered 2022-02-20 – 2022-02-24 (×4): 650 mg via ORAL
  Filled 2022-02-20 (×4): qty 2

## 2022-02-20 MED ORDER — NICARDIPINE HCL IN NACL 20-0.86 MG/200ML-% IV SOLN
3.0000 mg/h | INTRAVENOUS | Status: DC
  Administered 2022-02-20: 10 mg/h via INTRAVENOUS
  Administered 2022-02-20: 3 mg/h via INTRAVENOUS
  Administered 2022-02-20: 5 mg/h via INTRAVENOUS
  Filled 2022-02-20 (×2): qty 200

## 2022-02-20 MED ORDER — HYDROCODONE-ACETAMINOPHEN 5-325 MG PO TABS
1.0000 | ORAL_TABLET | Freq: Four times a day (QID) | ORAL | Status: DC | PRN
  Administered 2022-02-20: 1 via ORAL
  Filled 2022-02-20: qty 1

## 2022-02-20 MED ORDER — FENTANYL CITRATE PF 50 MCG/ML IJ SOSY
50.0000 ug | PREFILLED_SYRINGE | Freq: Once | INTRAMUSCULAR | Status: AC
  Administered 2022-02-20: 50 ug via INTRAVENOUS
  Filled 2022-02-20: qty 1

## 2022-02-20 MED ORDER — DOCUSATE SODIUM 100 MG PO CAPS: 100.0000 mg | ORAL_CAPSULE | Freq: Two times a day (BID) | ORAL | Status: DC | PRN

## 2022-02-20 MED ORDER — LABETALOL HCL 5 MG/ML IV SOLN
0.5000 mg/min | Status: DC
  Administered 2022-02-20: 0.5 mg/min via INTRAVENOUS
  Filled 2022-02-20: qty 40

## 2022-02-20 MED ORDER — HEPARIN SODIUM (PORCINE) 5000 UNIT/ML IJ SOLN
5000.0000 [IU] | Freq: Three times a day (TID) | INTRAMUSCULAR | Status: DC
  Administered 2022-02-21 – 2022-02-24 (×11): 5000 [IU] via SUBCUTANEOUS
  Filled 2022-02-20 (×11): qty 1

## 2022-02-20 MED ORDER — POLYETHYLENE GLYCOL 3350 17 G PO PACK: 17.0000 g | PACK | Freq: Every day | ORAL | Status: DC | PRN

## 2022-02-20 MED ORDER — LABETALOL HCL 5 MG/ML IV SOLN
0.5000 mg/min | Status: DC
  Administered 2022-02-20: 0.5 mg/min via INTRAVENOUS
  Administered 2022-02-21: 5 mg/min via INTRAVENOUS
  Filled 2022-02-20 (×3): qty 80

## 2022-02-20 MED ORDER — CHLORHEXIDINE GLUCONATE CLOTH 2 % EX PADS
6.0000 | MEDICATED_PAD | Freq: Every day | CUTANEOUS | Status: DC
  Administered 2022-02-21 – 2022-02-24 (×3): 6 via TOPICAL

## 2022-02-20 MED ORDER — IOHEXOL 350 MG/ML SOLN
100.0000 mL | Freq: Once | INTRAVENOUS | Status: AC | PRN
  Administered 2022-02-20: 100 mL via INTRAVENOUS

## 2022-02-20 MED ORDER — ESMOLOL HCL-SODIUM CHLORIDE 2000 MG/100ML IV SOLN
25.0000 ug/kg/min | INTRAVENOUS | Status: DC
  Administered 2022-02-20: 280 ug/kg/min via INTRAVENOUS
  Administered 2022-02-20: 275 ug/kg/min via INTRAVENOUS
  Administered 2022-02-20: 25 ug/kg/min via INTRAVENOUS
  Administered 2022-02-20: 274.87 ug/kg/min via INTRAVENOUS
  Filled 2022-02-20 (×6): qty 100

## 2022-02-20 MED ORDER — ESMOLOL BOLUS VIA INFUSION
500.0000 ug/kg | Freq: Once | INTRAVENOUS | Status: AC
  Administered 2022-02-20: 54450 ug via INTRAVENOUS
  Filled 2022-02-20 (×3): qty 55000

## 2022-02-20 NOTE — ED Notes (Signed)
Nurse unable to take report at this time,  ?

## 2022-02-20 NOTE — ED Notes (Signed)
Vascular at bedside

## 2022-02-20 NOTE — ED Triage Notes (Signed)
Pt states she is having left sided flank pain that started 2 days ago. Pt also reports left shoulder pain for the past 2 days. Denies injury. Pt reports vomiting x1. ?

## 2022-02-20 NOTE — Progress Notes (Signed)
Called by the [facility] long ED for review of CT scan and patient with known type B dissection.  Apparently patient presented with shoulder and neck pain.  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  No obvious evidence of type A dissection.  I have recommended transfer to [facility] for admission.  Would engage critical care for admission.  Heart rate less than 60.  Systolics less than 829.  Vascular will evaluate when she gets here.  We will likely plan repeat CT in 48 hours. ? ?Marty Heck, MD ?[facility] ?Office: [phone] ? ? ?

## 2022-02-20 NOTE — Progress Notes (Signed)
eLink Physician-Brief Progress Note ?Patient Name: Robin Guerrero ?DOB: February 09, 1986 ?MRN: [id number] ? ? ?Date of Service ? 02/20/2022  ?HPI/Events of Note ? Not able to reach desired HR goal on Esmolol IV infusion at maximum dose. Spoke with Dr. Carlis Abbott. He wants to try a Labetalol IV infusion. Dr. Carlis Abbott also states that it is OK to start patient on diet. Blood glucose = 159.  ?eICU Interventions ? Plan: ?D/C Esmolol IV infusion. ?Labetalol IV infusion. Titrate to SBP = 100-120 and HR = 90-80. ?Carb modified diet.   ? ? ? ?Intervention Category ?Major Interventions: Hypertension - evaluation and management ? ?Kiyana Vazguez Cornelia Copa ?02/20/2022, 9:09 PM ?

## 2022-02-20 NOTE — ED Notes (Signed)
Pt resting comfortably in bed. Pt states that she has been having left shoulder and side pain for several days and is concerned about her kidneys and aorta. Pt states that she was seen last year for kidney failure and has not been able to follow up with provider due to insurance changes. 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  ?Pt has been off of her HTN medications since the kidney failure and would like to be able to get back on them if her kidneys are working better.  ?

## 2022-02-20 NOTE — ED Provider Notes (Signed)
?  Physical Exam  ?BP (!) 166/112   Pulse 95   Temp 98.7 ?F (37.1 ?C) (Oral)   Resp (!) 21   Ht '5\' 9"'$  (1.753 m)   Wt 108.9 kg   SpO2 96%   BMI 35.44 kg/m?  ? ?Physical Exam ? ?Procedures  ?Procedures ? ?ED Course / MDM  ? ?Clinical Course as of 02/20/22 1704  ?Wed Feb 20, 2022  ?1440 I informed patient's of the CT angiogram results.  Confirmed at this time that she does not have abdominal pain or tenderness.  I have been able to reach the on-call vascular doctor, Dr. Carlis Abbott, who will review images from today and previous and call me back. [EW]  ?1511 I was able to reach intensivist, Dr. Wonda Amis, currently at [facility] and will see the patient at this ER, to admit and transfer to [facility] for management of blood pressure. [EW]  ?1543 Critical care is now here seeing the patient.  Plan to transfer to [facility] emergency department soon as possible. [EW]  ?1602 Case discussed with Dr. Gustavus Messing in anticipation of transfer to the: Emergency department, for management and vascular surgery consultation with Dr. Carlis Abbott. [EW]  ?  ?Clinical Course User Index ?[EW] Daleen Bo, MD  ? ?Medical Decision Making ?Amount and/or Complexity of Data Reviewed ?Labs: ordered. ?Radiology: ordered. ? ?Risk ?Prescription drug management. ?Decision regarding hospitalization. ? ?36 year old female presenting to the ED as a transfer from [place] for chronic type B dissection seen on CT scan today.  She is admitted to critical care and will be evaluated by vascular surgery as she has now arrived here.  Her recent blood pressure is in the 382N systolic.  We will switch her to esmolol. ? ?Consulted Dr. Carlis Abbott, vascular surgeon to inform him that the patient has arrived to the ER and he will see the patient at the bedside ? ? ? ? ? ?  Delia Heady, PA-C ?02/20/22 1712 ? ?  ?Varney Biles, MD ?02/20/22 1817 ? ?

## 2022-02-20 NOTE — ED Provider Notes (Signed)
[facility] DEPT Provider Note   CSN: [id number] Arrival date & time: 02/20/22  1021     History  Chief Complaint  Patient presents with   Flank Pain   Shoulder Pain    Robin Guerrero is a 36 y.o. female.  HPI Patient presents complaining of pain in the left shoulder and left neck, reminiscent of when she previously had an aortic dissection, for several days.  Because of her concern, she decided come here.  She has had difficulty with follow-up because of some insurance concerns.  She is taking her currently prescribed medicines as directed.  She denies fever, chills, shortness of breath, nausea or vomiting.  She has chronic tingling in the hands and feet, bilaterally, which she attributes to "neuropathy."  She denies trauma causing pain.  She denies fever, vomiting or dizziness.    Home Medications Prior to Admission medications   Medication Sig Start Date End Date Taking? Authorizing Provider  acetaminophen (TYLENOL) 325 MG tablet Take 650 mg by mouth every 6 (six) hours as needed for mild pain, fever or headache.    [provider]  amLODipine (NORVASC) 10 MG tablet Take 1 tablet (10 mg total) by mouth daily. 06/08/21   Nita Sells, MD  amLODipine (NORVASC) 5 MG tablet Take 5 mg by mouth daily. 02/01/22   [provider]  carvedilol (COREG) 25 MG tablet Take 1 tablet (25 mg total) by mouth 2 (two) times daily with a meal. 12/18/20   Theda Sers, Susette Racer, PA-C  cloNIDine (CATAPRES) 0.1 MG tablet Take 1 tablet (0.1 mg total) by mouth 3 (three) times daily. 12/18/20   Ulyses Amor, PA-C  dicyclomine (BENTYL) 20 MG tablet Take 1 tablet (20 mg total) by mouth 2 (two) times daily. 11/08/20   Larene Pickett, PA-C  ferrous sulfate 325 (65 FE) MG tablet Take 1 tablet (325 mg total) by mouth 2 (two) times daily with a meal. 06/07/21   Nita Sells, MD  hydrALAZINE (APRESOLINE) 50 MG tablet TAKE 1 AND 1/2 TABLETS (75 MG TOTAL) BY  MOUTH EVERY EIGHT HOURS. 11/17/20 11/17/21  Allie Bossier, MD  hydrochlorothiazide (HYDRODIURIL) 50 MG tablet TAKE 1 TABLET BY MOUTH EVERY DAY 09/04/21   Waynetta Sandy, MD  Multiple Vitamin (MULTIVITAMIN WITH MINERALS) TABS tablet Take 1 tablet by mouth daily. 11/18/20   Allie Bossier, MD  prazosin (MINIPRESS) 2 MG capsule Take 1 capsule (2 mg total) by mouth at bedtime. 06/07/21   Nita Sells, MD  QUEtiapine (SEROQUEL) 25 MG tablet TAKE 1 TABLET (25 MG TOTAL) BY MOUTH TWO TIMES DAILY. 11/17/20 11/17/21  Allie Bossier, MD      Allergies    Ativan [lorazepam]    Review of Systems   Review of Systems  Physical Exam Updated Vital Signs BP (!) 150/99    Pulse 94    Temp 98.7 F (37.1 C) (Oral)    Resp 16    Ht '5\' 9"'$  (1.753 m)    Wt 108.9 kg    SpO2 94%    BMI 35.44 kg/m  Physical Exam Vitals and nursing note reviewed.  Constitutional:      General: She is not in acute distress.    Appearance: She is well-developed. She is obese. She is not ill-appearing or toxic-appearing.  HENT:     Head: Normocephalic and atraumatic.     Right Ear: External ear normal.     Left Ear: External ear normal.  Eyes:  Conjunctiva/sclera: Conjunctivae normal.     Pupils: Pupils are equal, round, and reactive to light.  Neck:     Trachea: Phonation normal.  Cardiovascular:     Rate and Rhythm: Normal rate and regular rhythm.     Heart sounds: Normal heart sounds.  Pulmonary:     Effort: Pulmonary effort is normal. No respiratory distress.     Breath sounds: Normal breath sounds. No stridor.  Abdominal:     General: There is no distension.     Palpations: Abdomen is soft.     Tenderness: There is no abdominal tenderness.  Musculoskeletal:        General: Normal range of motion.     Cervical back: Normal range of motion and neck supple.  Skin:    General: Skin is warm and dry.  Neurological:     Mental Status: She is alert and oriented to person, place, and time.     Cranial  Nerves: No cranial nerve deficit.     Sensory: No sensory deficit.     Motor: No abnormal muscle tone.     Coordination: Coordination normal.  Psychiatric:        Mood and Affect: Mood normal.        Behavior: Behavior normal.        Thought Content: Thought content normal.        Judgment: Judgment normal.    ED Results / Procedures / Treatments   Labs (all labs ordered are listed, but only abnormal results are displayed) Labs Reviewed  CBC WITH DIFFERENTIAL/PLATELET - Abnormal; Notable for the following components:      Result Value   RBC 3.43 (*)    Hemoglobin 10.8 (*)    HCT 33.7 (*)    All other components within normal limits  COMPREHENSIVE METABOLIC PANEL - Abnormal; Notable for the following components:   Glucose, Bld 159 (*)    Calcium 8.5 (*)    Albumin 3.4 (*)    Total Bilirubin 1.9 (*)    All other components within normal limits  RESP PANEL BY RT-PCR (FLU A&B, COVID) ARPGX2  HCG, SERUM, QUALITATIVE  HIV ANTIBODY (ROUTINE TESTING W REFLEX)  CBC  BASIC METABOLIC PANEL  MAGNESIUM  PHOSPHORUS    EKG None  Radiology CT Angio Chest/Abd/Pel for Dissection W and/or W/WO  Result Date: 02/20/2022 CLINICAL DATA:  LEFT shoulder and flank pain for 2 days, single episode of vomiting, acute aortic syndrome, history of descending thoracic aortic dissection EXAM: CT ANGIOGRAPHY CHEST, ABDOMEN AND PELVIS TECHNIQUE: Non-contrast CT of the chest was initially obtained. Multidetector CT imaging through the chest, abdomen and pelvis was performed using the standard protocol during bolus administration of intravenous contrast. Multiplanar reconstructed images and MIPs were obtained and reviewed to evaluate the vascular anatomy. RADIATION DOSE REDUCTION: This exam was performed according to the departmental dose-optimization program which includes automated exposure control, adjustment of the mA and/or kV according to patient size and/or use of iterative reconstruction technique.  CONTRAST:  179m OMNIPAQUE IOHEXOL 350 MG/ML SOLN IV COMPARISON:  06/04/2021 FINDINGS: CTA CHEST FINDINGS Cardiovascular: Precontrast imaging shows aneurysmal dilatation of the distal aortic arch 4.7 cm diameter previously 4.3 cm. No discrete intramural hematoma identified. Following contrast, again identified dissection of the descending thoracic aorta beginning at the posterior aspect of the arch distal to the LEFT subclavian artery origin and extending through the aortic bifurcation. Additional linear filling defect identified within the aortic arch adjacent to the origin of the LEFT subclavian artery consistent  with an additional intimal flap new since the previous exam. No extension of dissection into the proximal great vessels. Pulmonary arteries appear grossly patent. Heart size normal. No pericardial effusion. Mediastinum/Nodes: Base of cervical region normal appearance. Esophagus unremarkable. No thoracic adenopathy. Lungs/Pleura: Subsegmental atelectasis BILATERAL lower lobes, minimally RIGHT upper lobe. No acute consolidation, pleural effusion, or pneumothorax. Musculoskeletal: Unremarkable Review of the MIP images confirms the above findings. CTA ABDOMEN AND PELVIS FINDINGS VASCULAR Aorta: Dissection of the descending thoracic aorta extending from aortic hiatus 3 aortic bifurcation into RIGHT common iliac artery. Aorta normal caliber. Celiac artery, SMA, and IMA arise from true lumen. LEFT renal artery arises from true lumen with RIGHT renal artery arising from false lumen. No para-aortic hemorrhage. No calcified plaque. Celiac: Patent SMA: Patent Renals: Patent bilaterally IMA: Patent Inflow: 1 patent Veins: Unopacified, poorly assessed. Review of the MIP images confirms the above findings. NON-VASCULAR Hepatobiliary: Gallbladder and liver normal appearance Pancreas: Enlargement of pancreatic tail with ill-defined margins question distal pancreatitis, recommend correlation with serum lipase. No focal  mass. Spleen: Normal appearance Adrenals/Urinary Tract: Adrenal glands normal appearance. Kidneys, ureters, and bladder normal appearance Stomach/Bowel: Stomach and bowel loops normal appearance Lymphatic: No adenopathy Reproductive: Exophytic mass RIGHT lateral uterus question leiomyoma again seen. Adnexa unremarkable. Other: Small amount of nonspecific free pelvic fluid. No free air. No hernia. Musculoskeletal: Osseous structures unremarkable. Review of the MIP images confirms the above findings. IMPRESSION: Chronic type B aortic dissection extending from the posterior aspect of the arch distal to the LEFT subclavian artery origin and extending through the aortic bifurcation into the RIGHT common iliac artery. New additional intimal flap is seen within the aortic arch adjacent to the origin of the LEFT subclavian artery consistent consistent with dissection, uncertain if related to the previous episode of dissection or a new dissection. 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 This recommendation follows 2010 ACCF/AHA/AATS/ACR/ASA/SCA/SCAI/SIR/STS/SVM Guidelines for the Diagnosis and Management of Patients With Thoracic Aortic Disease. Circulation. 2010; 121: Y503-T46. Aortic aneurysm NOS (ICD10-I71.9). Enlargement of pancreatic tail with ill-defined margins question distal pancreatitis, recommend correlation with serum lipase. Small amount of nonspecific free pelvic fluid. Critical Value/emergent results were called by telephone at the time of interpretation on 02/20/2022 at 2:26 pm to provider Daleen Bo MD, who verbally acknowledged these results. Electronically Signed   By: Lavonia Dana M.D.   On: 02/20/2022 14:28    Procedures Procedures    Medications Ordered in ED Medications  docusate sodium (COLACE) capsule 100 mg (has no administration in time  range)  polyethylene glycol (MIRALAX / GLYCOLAX) packet 17 g (has no administration in time range)  heparin injection 5,000 Units (has no administration in time range)  esmolol (BREVIBLOC) 2000 mg / 100 mL (20 mg/mL) infusion (has no administration in time range)  esmolol (BREVIBLOC) bolus via infusion 54,450 mcg (has no administration in time range)  acetaminophen (TYLENOL) tablet 650 mg (has no administration in time range)  iohexol (OMNIPAQUE) 350 MG/ML injection 100 mL (100 mLs Intravenous Contrast Given 02/20/22 1355)  fentaNYL (SUBLIMAZE) injection 50 mcg (50 mcg Intravenous Given 02/20/22 1526)    ED Course/ Medical Decision Making/ A&P Clinical Course as of 02/20/22 1735  Wed Feb 20, 2022  1440 I informed patient's of the CT angiogram results.  Confirmed at this time that she does not have abdominal pain or tenderness.  I have been  able to reach the on-call vascular doctor, Dr. Carlis Abbott, who will review images from today and previous and call me back. [EW]  1511 I was able to reach intensivist, Dr. Wonda Amis, currently at [facility] long and will see the patient at this ER, to admit and transfer to [facility] for management of blood pressure. [EW]  1610 Critical care is now here seeing the patient.  Plan to transfer to [facility] emergency department soon as possible. [EW]  1602 Case discussed with Dr. Gustavus Messing in anticipation of transfer to the: Emergency department, for management and vascular surgery consultation with Dr. Carlis Abbott. [EW]    Clinical Course User Index [EW] Daleen Bo, MD                           Medical Decision Making Patient with history of type B aortic dissection, presenting for evaluation of left shoulder pain, which started several days ago.  Problems Addressed: Acute pain of left shoulder: acute illness or injury that poses a threat to life or bodily functions    Details: History of aortic dissection, and hypertension  Amount and/or Complexity of Data  Reviewed Independent Historian:     Details: She is a cogent historian External Data Reviewed: notes.    Details: Management notes by vascular surgery and cardiology reviewed. Labs: ordered.    Details: CBC, metabolic panel, viral panel, pregnancy test-normal except hemoglobin low, Calcium low, albumin low per radiologist, new a sending, glucose high Radiology: ordered.    Details: Aortic dissection study by CT, chest, abdomen and pelvis.  Per radiologist no a sending aortic arch flap dissection ECG/medicine tests: ordered and independent interpretation performed.    Details: Cardiac monitor-sinus tachycardia Discussion of management or test interpretation with external provider(s): Case discussed with vascular surgeon Dr. Carlis Abbott who request patient be transferred to [facility] so he can see her and request that blood pressure and heart rate be controlled with medications.  Risk Prescription drug management. Decision regarding hospitalization. Risk Details: Patient presenting with pain left shoulder and neck, with history of aortic dissection, found to have new aortic dissection with tachycardia and hypertension.  She has been taking her usual medicines without improvement of her blood pressure.  Prior dissection was type B managed with medication.  Patient has normal mentation today.  She has no other clear cause for left shoulder and neck discomfort.  There is no evidence for cervical radiculopathy.  There is evidence for pancreatitis on CT but no other clinical evidence for pancreatitis.  The abdomen is soft and nontender.  She requires admission to ICU for management.  Intensivist consulted in the ED and admitted patient.  She will go by [facility], to [facility] emergency department where she can see Dr. Carlis Abbott urgently.  Critical Care Total time providing critical care: 75-105 minutes          Final Clinical Impression(s) / ED Diagnoses Final diagnoses:  Acute pain of left  shoulder  Dissection of ascending aorta    Rx / DC Orders ED Discharge Orders     None         Daleen Bo, MD 02/20/22 1736

## 2022-02-20 NOTE — H&P (Addendum)
NAMEMaeli Guerrero, MRN:  [id number], DOB:  [birth year], LOS: 0 ADMISSION DATE:  02/20/2022 CONSULTATION DATE:  02/20/2022 REFERRING MD:  Eulis Foster - EDP CHIEF COMPLAINT:  Aortic dissection   History of Present Illness:  36 year old woman who presented to [facility] ED 3/8 with L-sided flank pain and L shoulder pain x 2 days. PMHx significant for HTN, acute-on-chronic pancreatitis, CKD stage IIIa (baseline Cr 1.5-2) and type B aortic dissection (diagnosed 10/2020).  Patient states that she presented to the ED after 2 days of left side and neck pain; she was concerned because this pain was very similar to the pain she experienced with her previous aortic dissection diagnosis.  Reported to work today and did not feel well but attempted to make it through the day when her boss told her to go to the emergency department.  Reports left-sided neck and flank pain and tingling of her bilateral hands and feet (this happens "all the time" at home). Denies fever/chills, CP/SOB, dyspnea, n/v, headache.  Denies LE swelling at this time, but notes this does come and go, especially since she stopped taking her HCTZ.  She feels her blood pressure was better controlled on her prior agents (losartan and HCTZ).  Her blood pressure regimen was adjusted during her last hospitalization and she has had difficulty obtaining her medications (specifically carvedilol) and frequently misses doses of this.  She has not been checking her blood pressures at home and does not know what they are normally.  On ED arrival, patient was afebrile, tachycardic to 110s and hypertensive with SBP 150s to 160s.  Labs were notable for normal CBC, unremarkable CMP; COVID/flu negative, beta-hCG negative. CTA demonstrated chronic type B aortic dissection (similar to prior) with new intimal flap within the aortic arch. VVS was consulted with recommendation for transfer to [facility] for further management.  PCCM consulted for admission.  Pertinent Medical History:    Past Medical History:  Diagnosis Date   Descending thoracic dissection    History of anemia    no current med.   Hypertension    states under control with med., has been on med. x 3 yr.   Significant Hospital Events: Including procedures, antibiotic start and stop dates in addition to other pertinent events   3/8 - Presented to [facility] with L neck/flank pain. Hypertensive to 160s. CTA with chronic type B aortic dissection with new intimal flap. Labetalol gtt started with poor BP control, transitioned to esmolol gtt. VVS consulted. Transferred to [facility].  Interim History / Subjective:  PCCM consulted. As above.  Objective:  Blood pressure (!) 146/99, pulse 96, temperature 98.7 F (37.1 C), temperature source Oral, resp. rate (!) 23, height '5\' 9"'$  (1.753 m), weight 108.9 kg, SpO2 97 %, not currently breastfeeding.       No intake or output data in the 24 hours ending 02/20/22 1512 Filed Weights   02/20/22 1029  Weight: 108.9 kg   Physical Examination: General: Overall well-appearing young woman in NAD. Appears mildly uncomfortable. HEENT: [place]/AT, anicteric sclera, PERRL, moist mucous membranes. Neuro: Awake, oriented x 4. Responds to verbal stimuli. Following commands consistently. Moves all 4 extremities spontaneously.  CV: tachycardic, regular rhythm, no m/g/r. PULM: Breathing even and unlabored on RA. Lung fields CTAB. GI: Soft, nontender, nondistended. Normoactive bowel sounds. Extremities: Trace bilateral symmetric LE edema noted. +DP/PT pulses present and equal bilaterally. Skin: Warm/dry, no rashes.  Resolved Hospital Problem List:    Assessment & Plan:  Type B aortic dissection, acute-on-chronic with new intimal  flap Presented to [facility] ED 3/8 with two-day history of L-sided neck pain/flank pain. Reminiscent of previous aortic dissection presenting symptoms (10/2020). CTA demonstrating type B dissection (L [place] to R iliac) with new intimal flap. - Admit to ICU at [facility], ideally  [place] - VVS consulted, appreciate assistance with management - Esmolol gtt + load - Goal HR < 60, SBP < 120 - Cardiac monitoring - Likely repeat CTA in 48H (3/10)  Hypertension, poorly controlled Home medications include: amlodipine '10mg'$  daily, carvedilol '25mg'$  BID, hydralazine '75mg'$  TID, clonidine 0.'1mg'$  TID, ?HCTZ '50mg'$  daily. Per patient, previously on losartan and HCTZ and felt BP was better controlled on these medications, stopped in the setting of ARF. Has had difficulty getting Coreg. - Continue esmolol gtt for now for BP control - Will require reinitiation of home medications as appropriate - Consider clonidine reinitiation due to risk of rebound hypertension - Engage TOC team for medication assistance  CKD stage IIIa Baseline Cr variable, appears to be 1.5-2 most recently. Cr 0.73 on admission. - Trend BMP - Replete electrolytes as indicated - Monitor I&Os - Avoid nephrotoxic agents as able - Ensure adequate renal perfusion  Tobacco use - Encourage cessation  Best Practice: (right click and "Reselect all SmartList Selections" daily)   Diet/type: NPO w/ oral meds DVT prophylaxis: SCD GI prophylaxis: N/A Lines: N/A Foley:  N/A Code Status:  full code Last date of multidisciplinary goals of care discussion [Pending]  Labs:  CBC: Recent Labs  Lab 02/20/22 1126  WBC 8.6  NEUTROABS 6.9  HGB 10.8*  HCT 33.7*  MCV 98.3  PLT 409   Basic Metabolic Panel: Recent Labs  Lab 02/20/22 1126  NA 138  K 4.1  CL 107  CO2 24  GLUCOSE 159*  BUN 11  CREATININE 0.73  CALCIUM 8.5*   GFR: Estimated Creatinine Clearance: 127.8 mL/min (by C-G formula based on SCr of 0.73 mg/dL). Recent Labs  Lab 02/20/22 1126  WBC 8.6   Liver Function Tests: Recent Labs  Lab 02/20/22 1126  AST 25  ALT 14  ALKPHOS 51  BILITOT 1.9*  PROT 7.5  ALBUMIN 3.4*   No results for input(s): LIPASE, AMYLASE in the last 168 hours. No results for input(s): AMMONIA in the last 168  hours.  ABG:    Component Value Date/Time   TCO2 25 08/09/2013 1333    Coagulation Profile: No results for input(s): INR, PROTIME in the last 168 hours.  Cardiac Enzymes: No results for input(s): CKTOTAL, CKMB, CKMBINDEX, TROPONINI in the last 168 hours.  HbA1C: No results found for: HGBA1C  CBG: No results for input(s): GLUCAP in the last 168 hours.  Review of Systems:   Review of systems completed with pertinent positives/negatives outlined in above HPI.  Past Medical History:  She,  has a past medical history of Descending thoracic dissection, History of anemia, and Hypertension.   Surgical History:   Past Surgical History:  Procedure Laterality Date   FOREIGN BODY REMOVAL Left 02/10/2015   Procedure: ATTEMPTED EXPLANTATION OF BIRTH CONTROL DEVICE LEFT ARM;  Surgeon: Johnathan Hausen, MD;  Location: [place];  Service: General;  Laterality: Left;   FOREIGN BODY REMOVAL Left 05/12/2015   Procedure: REMOVAL OF LEFT ARM BIRTH CONTROL DEVICE;  Surgeon: Johnathan Hausen, MD;  Location: [place];  Service: General;  Laterality: Left;   SUBMANDIBULAR GLAND EXCISION Right 05/01/2009   Social History:   reports that she has been smoking cigarettes. She has a 1.25 pack-year smoking  history. She has never used smokeless tobacco. She reports current alcohol use. She reports that she does not use drugs.   Family History:  Her family history includes Breast cancer (age of onset: 49) in her mother; Heart disease in her mother; Hypertension in her mother.   Allergies: Allergies  Allergen Reactions   Ativan [Lorazepam] Other (See Comments)    Disorientated, combative   Home Medications: Prior to Admission medications   Medication Sig Start Date End Date Taking? Authorizing Provider  acetaminophen (TYLENOL) 325 MG tablet Take 650 mg by mouth every 6 (six) hours as needed for mild pain, fever or headache.    [provider]  amLODipine (NORVASC)  10 MG tablet Take 1 tablet (10 mg total) by mouth daily. 06/08/21   Nita Sells, MD  amLODipine (NORVASC) 5 MG tablet Take 5 mg by mouth daily. 02/01/22   [provider]  carvedilol (COREG) 25 MG tablet Take 1 tablet (25 mg total) by mouth 2 (two) times daily with a meal. 12/18/20   Theda Sers, Susette Racer, PA-C  cloNIDine (CATAPRES) 0.1 MG tablet Take 1 tablet (0.1 mg total) by mouth 3 (three) times daily. 12/18/20   Ulyses Amor, PA-C  dicyclomine (BENTYL) 20 MG tablet Take 1 tablet (20 mg total) by mouth 2 (two) times daily. 11/08/20   Larene Pickett, PA-C  ferrous sulfate 325 (65 FE) MG tablet Take 1 tablet (325 mg total) by mouth 2 (two) times daily with a meal. 06/07/21   Nita Sells, MD  hydrALAZINE (APRESOLINE) 50 MG tablet TAKE 1 AND 1/2 TABLETS (75 MG TOTAL) BY MOUTH EVERY EIGHT HOURS. 11/17/20 11/17/21  Allie Bossier, MD  hydrochlorothiazide (HYDRODIURIL) 50 MG tablet TAKE 1 TABLET BY MOUTH EVERY DAY 09/04/21   Waynetta Sandy, MD  Multiple Vitamin (MULTIVITAMIN WITH MINERALS) TABS tablet Take 1 tablet by mouth daily. 11/18/20   Allie Bossier, MD  prazosin (MINIPRESS) 2 MG capsule Take 1 capsule (2 mg total) by mouth at bedtime. 06/07/21   Nita Sells, MD  QUEtiapine (SEROQUEL) 25 MG tablet TAKE 1 TABLET (25 MG TOTAL) BY MOUTH TWO TIMES DAILY. 11/17/20 11/17/21  Allie Bossier, MD    Critical care time: 40 minutes   Lestine Mount, [place] [facility] 02/20/22 3:12 PM  Please see [email or web address] for pager details.  From 7A-7P if no response, please call [phone] After hours, please call [facility] [phone]

## 2022-02-20 NOTE — Consult Note (Signed)
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 female with history of hypertension and type B aortic dissection that vascular surgery has been consulted for evaluation after repeat CT imaging was obtained at [facility].  Patient presented with left shoulder pain that started on Monday.  This prompted her presentation to [facility] ED where a repeat CTA was obtained.  She states her left shoulder pain is worse with movement although it does feel similar to her dissection in November but not as severe.  Repeat CTA showed evidence of a chronic type B dissection but she also had evidence of a new intimal flap in the aortic arch adjacent to the left subclavian artery.  She was previously seen by my partner Dr. Stanford Breed 11/09/2020 in consultation for her acute dissection at the time.  This was managed nonoperatively with aggressive blood pressure management.  At that time dissection extending from the origin of the left subclavian to the right common iliac artery with no malperfusion.  She was last seen 06/12/2021 and the dissection was relatively stable with plans for follow-up in 1 year.  She states she has been taking all of her blood pressure medicines at home.  Currently on amlodipine, hydrochlorothiazide, carvedilol, clonidine, and hydralazine.    Past Medical History:  Diagnosis Date   Descending thoracic dissection    History of anemia    no current med.   Hypertension    states under control with med., has been on med. x 3 yr.    Past Surgical History:  Procedure Laterality Date   FOREIGN BODY REMOVAL Left 02/10/2015   Procedure: ATTEMPTED EXPLANTATION OF BIRTH CONTROL DEVICE LEFT ARM;  Surgeon: Johnathan Hausen, MD;  Location: [place];  Service: General;  Laterality: Left;    FOREIGN BODY REMOVAL Left 05/12/2015   Procedure: REMOVAL OF LEFT ARM BIRTH CONTROL DEVICE;  Surgeon: Johnathan Hausen, MD;  Location: [place];  Service: General;  Laterality: Left;   SUBMANDIBULAR GLAND EXCISION Right 05/01/2009    Allergies  Allergen Reactions   Ativan [Lorazepam] Other (See Comments)    Disorientated, combative    Prior to Admission medications   Medication Sig Start Date End Date Taking? Authorizing Provider  acetaminophen (TYLENOL) 325 MG tablet Take 650 mg by mouth every 6 (six) hours as needed for mild pain, fever or headache.    [provider]  amLODipine (NORVASC) 10 MG tablet Take 1 tablet (10 mg total) by mouth daily. 06/08/21   Nita Sells, MD  amLODipine (NORVASC) 5 MG tablet Take 5 mg by mouth daily. 02/01/22   [provider]  carvedilol (COREG) 25 MG tablet Take 1 tablet (25 mg total) by mouth 2 (two) times daily with a meal. 12/18/20   Theda Sers, Susette Racer, PA-C  cloNIDine (CATAPRES) 0.1 MG tablet Take 1 tablet (0.1 mg total) by mouth 3 (three) times daily. 12/18/20   Ulyses Amor, PA-C  dicyclomine (BENTYL) 20 MG tablet Take 1 tablet (20 mg total) by mouth 2 (two) times daily. 11/08/20   Larene Pickett, PA-C  ferrous sulfate 325 (65 FE) MG tablet Take 1 tablet (325 mg total) by mouth 2 (two) times daily with a meal. 06/07/21   Nita Sells, MD  hydrALAZINE (APRESOLINE) 50 MG tablet TAKE 1 AND  1/2 TABLETS (75 MG TOTAL) BY MOUTH EVERY EIGHT HOURS. 11/17/20 11/17/21  Allie Bossier, MD  hydrochlorothiazide (HYDRODIURIL) 50 MG tablet TAKE 1 TABLET BY MOUTH EVERY DAY 09/04/21   Waynetta Sandy, MD  Multiple Vitamin (MULTIVITAMIN WITH MINERALS) TABS tablet Take 1 tablet by mouth daily. 11/18/20   Allie Bossier, MD  prazosin (MINIPRESS) 2 MG capsule Take 1 capsule (2 mg total) by mouth at bedtime. 06/07/21   Nita Sells, MD  QUEtiapine (SEROQUEL) 25 MG tablet TAKE 1 TABLET (25 MG TOTAL) BY MOUTH TWO TIMES  DAILY. 11/17/20 11/17/21  Allie Bossier, MD    Social History   Socioeconomic History   Marital status: Single    Spouse name: Not on file   Number of children: 2   Years of education: Not on file   Highest education level: Not on file  Occupational History   Not on file  Tobacco Use   Smoking status: Some Days    Packs/day: 0.25    Years: 5.00    Pack years: 1.25    Types: Cigarettes   Smokeless tobacco: Never   Tobacco comments:    1 pack/week  Vaping Use   Vaping Use: Never used  Substance and Sexual Activity   Alcohol use: Yes    Comment: occasionally   Drug use: No   Sexual activity: Not Currently  Other Topics Concern   Not on file  Social History Narrative   Not on file   Social Determinants of Health   Financial Resource Strain: Not on file  Food Insecurity: Not on file  Transportation Needs: Not on file  Physical Activity: Not on file  Stress: Not on file  Social Connections: Not on file  Intimate Partner Violence: Not on file     Family History  Problem Relation Age of Onset   Breast cancer Mother 83   Heart disease Mother    Hypertension Mother     ROS: '[x]'$  Positive   '[ ]'$  Negative   '[ ]'$  All sytems reviewed and are negative  Cardiovascular: '[]'$  chest pain/pressure '[]'$  palpitations '[]'$  SOB lying flat '[]'$  DOE '[]'$  pain in legs while walking '[]'$  pain in legs at rest '[]'$  pain in legs at night '[]'$  non-healing ulcers '[]'$  hx of DVT '[]'$  swelling in legs  Pulmonary: '[]'$  productive cough '[]'$  asthma/wheezing '[]'$  home O2  Neurologic: '[]'$  weakness in '[]'$  arms '[]'$  legs '[]'$  numbness in '[]'$  arms '[]'$  legs '[]'$  hx of CVA '[]'$  mini stroke '[]'$ difficulty speaking or slurred speech '[]'$  temporary loss of vision in one eye '[]'$  dizziness  Hematologic: '[]'$  hx of cancer '[]'$  bleeding problems '[]'$  problems with blood clotting easily  Endocrine:   '[]'$  diabetes '[]'$  thyroid disease  GI '[]'$  vomiting blood '[]'$  blood in stool  GU: '[]'$  CKD/renal failure '[]'$  HD--'[]'$  M/W/F or '[]'$  T/T/S '[]'$   burning with urination '[]'$  blood in urine  Psychiatric: '[]'$  anxiety '[]'$  depression  Musculoskeletal: '[]'$  arthritis '[]'$  joint pain  Integumentary: '[]'$  rashes '[]'$  ulcers  Constitutional: '[]'$  fever '[]'$  chills   Physical Examination  Vitals:   02/20/22 1537 02/20/22 1615  BP: (!) 128/94 (!) 166/112  Pulse:  95  Resp: (!) 24 (!) 21  Temp:    SpO2: 97% 96%   Body mass index is 35.44 kg/m.  General:  WDWN in NAD Gait: Not observed HENT: WNL, normocephalic Pulmonary: normal non-labored breathing, without Rales, rhonchi,  wheezing Cardiac: regular, without  Murmurs, rubs or gallops Abdomen:  soft, NT/ND Vascular  Exam/Pulses: Palpable radial pulses bilateral upper extremities Palpable femoral pulses bilaterally Palpable DP pulses bilaterally Both feet are motor/sensory intact Extremities: without ischemic changes Musculoskeletal: no muscle wasting or atrophy  Neurologic: A&O X 3; Appropriate Affect ; SENSATION: normal; MOTOR FUNCTION:  moving all extremities equally. Speech is fluent/normal  CBC    Component Value Date/Time   WBC 8.6 02/20/2022 1126   RBC 3.43 (L) 02/20/2022 1126   HGB 10.8 (L) 02/20/2022 1126   HCT 33.7 (L) 02/20/2022 1126   PLT 250 02/20/2022 1126   MCV 98.3 02/20/2022 1126   MCH 31.5 02/20/2022 1126   MCHC 32.0 02/20/2022 1126   RDW 14.9 02/20/2022 1126   LYMPHSABS 1.0 02/20/2022 1126   MONOABS 0.6 02/20/2022 1126   EOSABS 0.1 02/20/2022 1126   BASOSABS 0.0 02/20/2022 1126    BMET    Component Value Date/Time   NA 138 02/20/2022 1126   K 4.1 02/20/2022 1126   CL 107 02/20/2022 1126   CO2 24 02/20/2022 1126   GLUCOSE 159 (H) 02/20/2022 1126   BUN 11 02/20/2022 1126   CREATININE 0.73 02/20/2022 1126   CALCIUM 8.5 (L) 02/20/2022 1126   GFRNONAA >60 02/20/2022 1126   GFRAA >60 08/27/2020 0351    COAGS: Lab Results  Component Value Date   INR 1.1 08/27/2020     Non-Invasive Vascular Imaging:    CTA reviewed from [place] from  today on 02/20/22 and on my review there is a stable chronic type B dissection compared to her CT last year from the left subclavian artery to the right iliac artery.  There is a new intimal flap in the aortic arch adjacent to the left subclavian artery that was not viewed on previous CT imaging.   ASSESSMENT/PLAN: This is a 36 y.o. female with known chronic type B dissection initially diagnosed in November 2021 that was managed nonoperatively with aggressive blood pressure management.  She presented to [facility] ED with left shoulder pain since Monday and a repeat CT showed evidence of new intimal flap in the aortic arch adjacent to the left subclavian artery and vascular was consulted.  I am not certain that her pain is related to this new flap visualized on CT as I discussed with her today.  That being said, we will treat this new flap as if it is an acute event with aggressive blood pressure management.  I recommended admission to the ICU for aggressive blood pressure control with goal systolics less than 161 and goal heart rate less than 80 and closer to 60.  We will plan repeat CTA scan in 48 hours.  No evidence of malperfusion as she has excellent palpable pedal pulses on exam with no abdominal pain.  I have updated her on the plan of care.  No plans for surgical intervention at this time.   Marty Heck, MD [facility] Office: [place]

## 2022-02-21 ENCOUNTER — Inpatient Hospital Stay (HOSPITAL_COMMUNITY): Payer: 59

## 2022-02-21 LAB — BASIC METABOLIC PANEL
Anion gap: 8 (ref 5–15)
BUN: 8 mg/dL (ref 6–20)
CO2: 22 mmol/L (ref 22–32)
Calcium: 8.6 mg/dL — ABNORMAL LOW (ref 8.9–10.3)
Chloride: 109 mmol/L (ref 98–111)
Creatinine, Ser: 0.78 mg/dL (ref 0.44–1.00)
GFR, Estimated: >60 mL/min (ref >60)
Glucose, Bld: 119 mg/dL — ABNORMAL HIGH (ref 70–99)
Potassium: 3.3 mmol/L — ABNORMAL LOW (ref 3.5–5.1)
Sodium: 139 mmol/L (ref 135–145)

## 2022-02-21 LAB — PHOSPHORUS: Phosphorus: 3.2 mg/dL (ref 2.5–4.6)

## 2022-02-21 LAB — MAGNESIUM: Magnesium: 1.8 mg/dL (ref 1.7–2.4)

## 2022-02-21 LAB — CBC
HCT: 29.3 % — ABNORMAL LOW (ref 36.0–46.0)
Hemoglobin: 9.5 g/dL — ABNORMAL LOW (ref 12.0–15.0)
MCH: 31.6 pg (ref 26.0–34.0)
MCHC: 32.4 g/dL (ref 30.0–36.0)
MCV: 97.3 fL (ref 80.0–100.0)
Platelets: 233 10*3/uL (ref 150–400)
RBC: 3.01 MIL/uL — ABNORMAL LOW (ref 3.87–5.11)
RDW: 14.8 % (ref 11.5–15.5)
WBC: 6.7 10*3/uL (ref 4.0–10.5)
nRBC: 0 % (ref 0.0–0.2)

## 2022-02-21 LAB — MRSA NEXT GEN BY PCR, NASAL: MRSA by PCR Next Gen: NOT DETECTED

## 2022-02-21 MED ORDER — POTASSIUM CHLORIDE 10 MEQ/100ML IV SOLN
10.0000 meq | INTRAVENOUS | Status: AC
  Administered 2022-02-21 (×4): 10 meq via INTRAVENOUS
  Filled 2022-02-21 (×3): qty 100

## 2022-02-21 MED ORDER — CLONIDINE HCL 0.1 MG PO TABS
0.1000 mg | ORAL_TABLET | Freq: Three times a day (TID) | ORAL | Status: DC
  Administered 2022-02-21 – 2022-02-22 (×4): 0.1 mg via ORAL
  Filled 2022-02-21 (×4): qty 1

## 2022-02-21 MED ORDER — PROCHLORPERAZINE EDISYLATE 10 MG/2ML IJ SOLN
10.0000 mg | Freq: Four times a day (QID) | INTRAMUSCULAR | Status: DC | PRN
  Administered 2022-02-21: 03:00:00 10 mg via INTRAVENOUS
  Filled 2022-02-21 (×2): qty 2

## 2022-02-21 MED ORDER — MAGNESIUM SULFATE 2 GM/50ML IV SOLN
2.0000 g | Freq: Once | INTRAVENOUS | Status: AC
  Administered 2022-02-21: 04:00:00 2 g via INTRAVENOUS
  Filled 2022-02-21: qty 50

## 2022-02-21 MED ORDER — POTASSIUM CHLORIDE CRYS ER 20 MEQ PO TBCR
20.0000 meq | EXTENDED_RELEASE_TABLET | ORAL | Status: AC
  Administered 2022-02-21: 10:00:00 20 meq via ORAL
  Filled 2022-02-21: qty 1

## 2022-02-21 MED ORDER — CARVEDILOL 25 MG PO TABS
25.0000 mg | ORAL_TABLET | Freq: Two times a day (BID) | ORAL | Status: DC
  Administered 2022-02-21 – 2022-02-24 (×7): 25 mg via ORAL
  Filled 2022-02-21 (×7): qty 1

## 2022-02-21 MED ORDER — DILTIAZEM HCL-DEXTROSE 125-5 MG/125ML-% IV SOLN (PREMIX)
1.0000 mg/h | INTRAVENOUS | Status: DC
  Administered 2022-02-21: 01:00:00 1 mg/h via INTRAVENOUS
  Filled 2022-02-21 (×2): qty 125

## 2022-02-21 MED ORDER — LOSARTAN POTASSIUM 25 MG PO TABS
25.0000 mg | ORAL_TABLET | Freq: Every day | ORAL | Status: DC
  Administered 2022-02-22: 25 mg via ORAL
  Filled 2022-02-21: qty 1

## 2022-02-21 MED ORDER — DICYCLOMINE HCL 20 MG PO TABS
20.0000 mg | ORAL_TABLET | Freq: Two times a day (BID) | ORAL | Status: DC
  Administered 2022-02-21 – 2022-02-24 (×7): 20 mg via ORAL
  Filled 2022-02-21 (×8): qty 1

## 2022-02-21 MED ORDER — FERROUS SULFATE 325 (65 FE) MG PO TABS
325.0000 mg | ORAL_TABLET | Freq: Two times a day (BID) | ORAL | Status: DC
  Administered 2022-02-21 – 2022-02-24 (×6): 325 mg via ORAL
  Filled 2022-02-21 (×6): qty 1

## 2022-02-21 MED ORDER — AMLODIPINE BESYLATE 10 MG PO TABS
10.0000 mg | ORAL_TABLET | Freq: Every day | ORAL | Status: DC
Start: 2022-02-21 — End: 2022-02-24
  Administered 2022-02-21 – 2022-02-24 (×4): 10 mg via ORAL
  Filled 2022-02-21 (×4): qty 1

## 2022-02-21 MED ORDER — LABETALOL HCL 5 MG/ML IV SOLN
0.5000 mg/min | Status: DC
  Administered 2022-02-21: 4 mg/min via INTRAVENOUS
  Administered 2022-02-21: 5 mg/min via INTRAVENOUS
  Administered 2022-02-21: 01:00:00 4 mg/min via INTRAVENOUS
  Administered 2022-02-21: 08:00:00 1.5 mg/min via INTRAVENOUS
  Administered 2022-02-21: 5 mg/min via INTRAVENOUS
  Administered 2022-02-23: 0.5 mg/min via INTRAVENOUS
  Filled 2022-02-21 (×9): qty 200

## 2022-02-21 MED ORDER — HYDRALAZINE HCL 25 MG PO TABS
75.0000 mg | ORAL_TABLET | Freq: Three times a day (TID) | ORAL | Status: DC
  Administered 2022-02-21 – 2022-02-22 (×4): 75 mg via ORAL
  Filled 2022-02-21 (×4): qty 1

## 2022-02-21 MED ORDER — ADULT MULTIVITAMIN W/MINERALS CH
1.0000 | ORAL_TABLET | Freq: Every day | ORAL | Status: DC
  Administered 2022-02-21 – 2022-02-24 (×4): 1 via ORAL
  Filled 2022-02-21 (×4): qty 1

## 2022-02-21 MED ORDER — ORAL CARE MOUTH RINSE
15.0000 mL | Freq: Two times a day (BID) | OROMUCOSAL | Status: DC
  Administered 2022-02-21 – 2022-02-23 (×5): 15 mL via OROMUCOSAL

## 2022-02-21 NOTE — Progress Notes (Signed)
eLink Physician-Brief Progress Note ?Patient Name: Robin Guerrero ?DOB: 11/14/1986 ?MRN: [id number] ? ? ?Date of Service ? 02/21/2022  ?HPI/Events of Note ? Patient c/o nausea - QTc interval = 0.519 seconds.    ?eICU Interventions ? Plan: ?Compazine 10 mg IV Q 6 hours PRN N/V.  ? ? ? ?Intervention Category ?Major Interventions: Other: ? ?Robin Guerrero ?02/21/2022, 2:51 AM ?

## 2022-02-21 NOTE — Plan of Care (Signed)
?  Problem: Clinical Measurements: ?Goal: Ability to maintain clinical measurements within normal limits will improve ?Outcome: Progressing ?Goal: Will remain free from infection ?Outcome: Progressing ?Goal: Diagnostic test results will improve ?Outcome: Progressing ?Goal: Cardiovascular complication will be avoided ?Outcome: Progressing ?  ?Problem: Activity: ?Goal: Risk for activity intolerance will decrease ?Outcome: Progressing ?  ?Problem: Nutrition: ?Goal: Adequate nutrition will be maintained ?Outcome: Progressing ?  ?Problem: Elimination: ?Goal: Will not experience complications related to urinary retention ?Outcome: Progressing ?  ?Problem: Pain Managment: ?Goal: General experience of comfort will improve ?Outcome: Progressing ?  ?Problem: Safety: ?Goal: Ability to remain free from injury will improve ?Outcome: Progressing ?  ?Problem: Skin Integrity: ?Goal: Risk for impaired skin integrity will decrease ?Outcome: Progressing ?  ?

## 2022-02-21 NOTE — Progress Notes (Addendum)
[facility] ? ?Subjective  - Left shoulder pain improved ? ? ?Objective ?106/71 ?81 ?97.8 ?F (36.6 ?C) (Oral) ?13 ?92% ? ?Intake/Output Summary (Last 24 hours) at 02/21/2022 0750 ?Last data filed at 02/21/2022 0700 ?Gross per 24 hour  ?Intake 1569.5 ml  ?Output 400 ml  ?Net 1169.5 ml  ? ? ?Palpable radial pulse B UE, palpable DP B pulses. ?Motor and sensation intact all 4 extremities ?Abdomin soft ?Lungs non labored breathing ?BP systolic < 829 and HR 81.  Goal < 80. ? ? 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 ?Extremities well perfused and the left shoulder pain has improved ?Plan for repeat CTA tomorrow per DR. Clarks instruction.  Continue BP control by CCM. ? ?Roxy Horseman ?02/21/2022 ?7:50 AM ?-- ? ?Laboratory ?Lab Results: ?Recent Labs  ?  02/20/22 ?1126 02/21/22 ?0131  ?WBC 8.6 6.7  ?HGB 10.8* 9.5*  ?HCT 33.7* 29.3*  ?PLT 250 233  ? ?BMET ?Recent Labs  ?  02/20/22 ?1126 02/21/22 ?0131  ?NA 138 139  ?K 4.1 3.3*  ?CL 107 109  ?CO2 24 22  ?GLUCOSE 159* 119*  ?BUN 11 8  ?CREATININE 0.73 0.78  ?CALCIUM 8.5* 8.6*  ? ? ?COAG ?Lab Results  ?Component Value Date  ? INR 1.1 08/27/2020  ? ?No results found for: PTT ? ?I have seen and evaluated the patient. I agree with the PA note as documented above.  36 year old female with known chronic type B aortic dissection diagnosed in 2021.  Presented to ED at [facility] with left shoulder pain and a new CT scan concerning for new small intimal flap in the aortic arch adjacent to the left subclavian artery.  Treating this as acute although I am doubtful that this is the etiology for her shoulder pain.  Blood pressure well controlled this morning on labetalol and Cardizem drip.  Palpable radial and pedal pulses with no abdominal pain and no signs of malperfusion.  Discussed with critical care we will restart all of her home medications and start to  wean drips.  Plan repeat CTA tomorrow.  No indication for surgical intervention today. ? ?Marty Heck, MD ?[facility] ?Office: [phone] ? ? ?

## 2022-02-21 NOTE — Progress Notes (Signed)
? ?NAMETekia Guerrero, MRN:  [id number], DOB:  1986/06/25, LOS: 1 ?ADMISSION DATE:  02/20/2022 CONSULTATION DATE:  02/20/2022 ?REFERRING MD:  Eulis Foster - EDP CHIEF COMPLAINT:  Aortic dissection  ? ?History of Present Illness:  ?36 year old woman who presented to [facility] ED 3/8 with L-sided flank pain and L shoulder pain x 2 days. PMHx significant for HTN, acute-on-chronic pancreatitis, CKD stage IIIa (baseline Cr 1.5-2) and type B aortic dissection (diagnosed 10/2020). ? ?Patient states that she presented to the ED after 2 days of left side and neck pain; she was concerned because this pain was very similar to the pain she experienced with her previous aortic dissection diagnosis.  Reported to work today and did not feel well but attempted to make it through the day when her boss told her to go to the emergency department.  Reports left-sided neck and flank pain and tingling of her bilateral hands and feet (this happens "all the time" at home). Denies fever/chills, CP/SOB, dyspnea, n/v, headache.  Denies LE swelling at this time, but notes this does come and go, especially since she stopped taking her HCTZ.  She feels her blood pressure was better controlled on her prior agents (losartan and HCTZ).  Her blood pressure regimen was adjusted during her last hospitalization and she has had difficulty obtaining her medications (specifically carvedilol) and frequently misses doses of this.  She has not been checking her blood pressures at home and does not know what they are normally. ? ?On ED arrival, patient was afebrile, tachycardic to 110s and hypertensive with SBP 150s to 160s.  Labs were notable for normal CBC, unremarkable CMP; COVID/flu negative, beta-hCG negative. CTA demonstrated chronic type B aortic dissection (similar to prior) with new intimal flap within the aortic arch. VVS was consulted with recommendation for transfer to [facility] for further management. ? ?PCCM consulted for admission. ? ?Pertinent Medical History:   ?Descending thoracic dissection ?Anemia ?Hypertension ? ?Significant Hospital Events:  ?3/8 - Presented to [facility] with L neck/flank pain. Hypertensive to 160s. CTA with chronic type B aortic dissection with new intimal flap. Labetalol gtt started with poor BP control, transitioned to esmolol gtt. VVS consulted. Transferred to [facility]. ?3/9 no acute events overnight esmolol drip exchanged for Cardizem and labetalol ? ?Interim History / Subjective:  ?Lying in bed with no acute complaints ? ?Objective:  ?Blood pressure 106/71, pulse 81, temperature 97.8 ?F (36.6 ?C), temperature source Oral, resp. rate 13, height 5' 9"  (1.753 m), weight 102.4 kg, SpO2 92 %, not currently breastfeeding. ?   ?   ? ?Intake/Output Summary (Last 24 hours) at 02/21/2022 0809 ?Last data filed at 02/21/2022 0700 ?Gross per 24 hour  ?Intake 1569.5 ml  ?Output 400 ml  ?Net 1169.5 ml  ? ?Filed Weights  ? 02/20/22 1029 02/20/22 1843  ?Weight: 108.9 kg 102.4 kg  ? ?Physical Examination: ?General: Well-appearing adult female lying in bed with flat affect in no acute distress ?HEENT: [place]/AT, MM pink/moist, PERRL,  ?Neuro: Alert and minimally interactive with flat affect ?CV: s1s2 regular rate and rhythm, no murmur, rubs, or gallops,  ?PULM: Auscultation bilaterally, no increased work of breathing, no added breath sounds ?GI: soft, bowel sounds active in all 4 quadrants, non-tender, non-distended, tolerating oral diet ?Extremities: warm/dry, no edema  ?Skin: no rashes or lesions ? ?Resolved Hospital Problem List:  ? ? ?Assessment & Plan:  ?Type B aortic dissection, acute-on-chronic with new intimal flap ?-Presented to [facility] ED 3/8 with two-day history of L-sided neck pain/flank pain.  Reminiscent of previous aortic dissection presenting symptoms (10/2020). CTA demonstrating type B dissection (L [place] to R iliac) with new intimal flap. ?P: ?Vascular surgery consulted and following, appreciate assistance ?SBP goal less than 120 ?Heart rate goal less than 80 with goal  closer to 60 ?Vascular surgery plans to repeat CT at 48 hours ?Continuous telemetry ?Currently remains on labetalol and Cardizem drip ? ?Hypertension, poorly controlled ?-Home medications include: amlodipine 38m daily, carvedilol 215mBID, hydralazine 7570mID, clonidine 0.1mg23mD, ?HCTZ 50mg59mly. Per patient, previously on losartan and HCTZ and felt BP was better controlled on these medications, stopped in the setting of ARF. Has had difficulty getting Coreg. ?P: ?Appears that esmolol and nicardipine drips have been weaned off ?Currently remains on labetalol and Cardizem drips ?Vascular surgery recommended cardiology consult to assist in hypertension management ?SBP and heart rate goal as above ?Resume home: Norvasc 10 mg daily, carvedilol 25 mg twice daily, clonidine 0.1 3 times daily, and hydralazine 75 mg 3 times daily ? ?CKD stage IIIa ?-Baseline Cr variable, appears to be 1.5-2 most recently. Cr 0.73 on admission. ?P: ?Follow renal function  ?Monitor urine output ?Trend Bmet ?Avoid nephrotoxins ?Ensure adequate renal perfusion  ? ?Tobacco use ?P: ?Cessation education ? ?Mild hypokalemia ?P: ?Supplement as needed ?Trend to be met ? ?Best Practice: (right click and "Reselect all SmartList Selections" daily)  ? ?Diet/type: NPO w/ oral meds ?DVT prophylaxis: SCD ?GI prophylaxis: N/A ?Lines: N/A ?Foley:  N/A ?Code Status:  full code ?Last date of multidisciplinary goals of care discussion [Pending] ? ?  ?Critical care time:   ? ?Performed by: Tynan Boesel D. Harris ? ?Total critical care time: 31 minutes ? ?Critical care time was exclusive of separately billable procedures and treating other patients. ? ?Critical care was necessary to treat or prevent imminent or life-threatening deterioration. ? ?Critical care was time spent personally by me on the following activities: development of treatment plan with patient and/or surrogate as well as nursing, discussions with consultants, evaluation of patient's response to  treatment, examination of patient, obtaining history from patient or surrogate, ordering and performing treatments and interventions, ordering and review of laboratory studies, ordering and review of radiographic studies, pulse oximetry and re-evaluation of patient's condition. ? ?Raphael Fitzpatrick D. Harris, NP-C ?[facility] ?Personal contact information can be found on Amion  ?02/21/2022, 8:17 AM ? ? ?

## 2022-02-21 NOTE — Consult Note (Signed)
CARDIOLOGY CONSULT NOTE  Patient ID: Robin Guerrero MRN: [id number] DOB/AGE: December 05, 1986 36 y.o.  Admit date: 02/20/2022 Referring Physician: [facility] Reason for Consultation:  Hypertension, type B dissection  HPI:   36 y.o. 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   Patient is admitted to ICU for medical management of type B dissection with vascular surgery following. I was asked to see the patient for blood pressure management.  I saw the patient once in 2022. She has not been able to keep up with any of her follow up appts with me since then. She attributes this to starting a new job and not being able to coordinate time off. She has had difficulty getting refills of her blood pressure medications through her PCP. As as result, she ran out of carvedilol. She was still taking hydralazine,clonidine, amlodipine.  Currently, she is on labetalol and diltiazem drips. SBP down from 160s on presentation to 90s now. She did receive amlodipine 10 mg, coreg 25 mg, clonidine 0.1 mg, hydralazine 75 mg this am.   She currently denies any symptoms.    Patient was hospitalized in 11/2020 with type B aortic dissection, which was medically managed Dissection was thought to be due to malignant hypertension. She was recommended f/u CTA in a month after discharge and f/u w/vascular surgeon Dr. Jamelle Haring, which she has not undergone. She was made DNR due to her serious medical issues  Past Medical History:  Diagnosis Date   Descending thoracic dissection    History of anemia    no current med.   Hypertension    states under control with med., has been on med. x 3 yr.     Past Surgical History:  Procedure Laterality Date   FOREIGN BODY  REMOVAL Left 02/10/2015   Procedure: ATTEMPTED EXPLANTATION OF BIRTH CONTROL DEVICE LEFT ARM;  Surgeon: Johnathan Hausen, MD;  Location: [place];  Service: General;  Laterality: Left;   FOREIGN BODY REMOVAL Left 05/12/2015   Procedure: REMOVAL OF LEFT ARM BIRTH CONTROL DEVICE;  Surgeon: Johnathan Hausen, MD;  Location: [facility];  Service: General;  Laterality: Left;   SUBMANDIBULAR GLAND EXCISION Right 05/01/2009      Family History  Problem Relation Age of Onset   Breast cancer Mother 60   Heart disease Mother    Hypertension Mother      Social History: Social History   Socioeconomic History   Marital status: Single    Spouse name: Not on file   Number of children: 2   Years of education: Not on file   Highest education level: Not on file  Occupational History   Not on file  Tobacco Use   Smoking status: Some Days    Packs/day: 0.25    Years: 5.00    Pack years: 1.25    Types: Cigarettes   Smokeless tobacco: Never   Tobacco comments:    1 pack/week  Vaping Use   Vaping Use: Never used  Substance and Sexual Activity   Alcohol use: Yes    Comment: occasionally   Drug use: No   Sexual activity: Not Currently  Other Topics Concern   Not on file  Social History Narrative   Not on file   Social Determinants of  Health   Financial Resource Strain: Not on file  Food Insecurity: Not on file  Transportation Needs: Not on file  Physical Activity: Not on file  Stress: Not on file  Social Connections: Not on file  Intimate Partner Violence: Not on file     Medications Prior to Admission  Medication Sig Dispense Refill Last Dose   acetaminophen (TYLENOL) 325 MG tablet Take 650 mg by mouth every 6 (six) hours as needed for mild pain, fever or headache.      amLODipine (NORVASC) 10 MG tablet Take 1 tablet (10 mg total) by mouth daily. 30 tablet 2    amLODipine (NORVASC) 5 MG tablet Take 5 mg by mouth daily.      carvedilol (COREG) 25 MG  tablet Take 1 tablet (25 mg total) by mouth 2 (two) times daily with a meal. 60 tablet 0    cloNIDine (CATAPRES) 0.1 MG tablet Take 1 tablet (0.1 mg total) by mouth 3 (three) times daily. 90 tablet 0    dicyclomine (BENTYL) 20 MG tablet Take 1 tablet (20 mg total) by mouth 2 (two) times daily. 20 tablet 0    ferrous sulfate 325 (65 FE) MG tablet Take 1 tablet (325 mg total) by mouth 2 (two) times daily with a meal. 30 tablet 3    hydrALAZINE (APRESOLINE) 50 MG tablet TAKE 1 AND 1/2 TABLETS (75 MG TOTAL) BY MOUTH EVERY EIGHT HOURS. 135 tablet 0    hydrochlorothiazide (HYDRODIURIL) 50 MG tablet TAKE 1 TABLET BY MOUTH EVERY DAY 90 tablet 1    Multiple Vitamin (MULTIVITAMIN WITH MINERALS) TABS tablet Take 1 tablet by mouth daily. 30 tablet 0    prazosin (MINIPRESS) 2 MG capsule Take 1 capsule (2 mg total) by mouth at bedtime. 30 capsule 3    QUEtiapine (SEROQUEL) 25 MG tablet TAKE 1 TABLET (25 MG TOTAL) BY MOUTH TWO TIMES DAILY. 60 tablet 0     Review of Systems  Cardiovascular:  Negative for chest pain, dyspnea on exertion, leg swelling, palpitations and syncope.     Physical Exam: Physical Exam Vitals and nursing note reviewed.  Constitutional:      General: She is not in acute distress. Neck:     Vascular: No JVD.  Cardiovascular:     Rate and Rhythm: Normal rate and regular rhythm.     Pulses: Normal pulses.     Heart sounds: Normal heart sounds. No murmur heard. Pulmonary:     Effort: Pulmonary effort is normal.     Breath sounds: Normal breath sounds. No wheezing or rales.       Lab Results: Reviewed and interpreted: CBC, BMP  Imaging/tests reviewed and independently interpreted: CTA chest/abdomen/pelvis 02/20/2022: Chronic type B aortic dissection extending from the posterior aspect of the arch distal to the LEFT subclavian artery origin and extending through the aortic bifurcation into the RIGHT common iliac artery.   New additional intimal flap is seen within the aortic  arch adjacent to the origin of the LEFT subclavian artery consistent consistent with dissection, uncertain if related to the previous episode of dissection or a new dissection.   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 This recommendation follows 2010 ACCF/AHA/AATS/ACR/ASA/SCA/SCAI/SIR/STS/SVM Guidelines for the Diagnosis and Management of Patients With Thoracic Aortic Disease. Circulation. 2010; 121: J681-L57. Aortic aneurysm NOS (ICD10-I71.9).   Enlargement of pancreatic tail with ill-defined margins question distal  pancreatitis, recommend correlation with serum lipase.   Small amount of nonspecific free pelvic fluid.   Critical Value/emergent results were called by telephone at the time of interpretation on 02/20/2022 at 2:26 pm to provider Daleen Bo MD, who verbally acknowledged these results.      Assessment & Recommendations:  36 y.o. 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   Type B dissection: Unclear of the stated flap is new or old. Continue medical management, submuscular surgery recommendations.  Hypertension: Currently controlled on labetalol and diltiazem drips, currently being weaned. Patient received amlodipine 10 mg, coreg 25 mg, clonidine 0.1 mg, hydralazine 75 mg this am.  Continue weaning off diltiazem and labetalol drips. Added losartan 25 mg daily.  This can be uptitrated, as tolerated and needed. We will arrange outpatient follow-up.  I have emphasized the importance of compliance with appointments and medications to avoid recurrence of  dissection.  We will continue to follow with the patient.  Discussed interpretation of tests and management recommendations with the primary team     Nigel Mormon, MD Pager: [phone] Office: [phone]

## 2022-02-21 NOTE — Progress Notes (Signed)
eLink Physician-Brief Progress Note ?Patient Name: Robin Guerrero ?DOB: 1986/05/26 ?MRN: [id number] ? ? ?Date of Service ? 02/21/2022  ?HPI/Events of Note ? HR still in mid 90's on maximal dose of Labetalol IV infusion. SBP = 103 which is within goal.   ?eICU Interventions ? Plan: ?Will add Cardizem IV infusion to Labetalol. Titrate to SBP = 100-120 and HR = 60-80.  ? ? ? ?Intervention Category ?Major Interventions: Other: ? ?Layah Skousen Cornelia Copa ?02/21/2022, 12:13 AM ?

## 2022-02-21 NOTE — Progress Notes (Signed)
[facility] ADULT ICU REPLACEMENT PROTOCOL ? ? ?The patient does apply for the [facility] Adult ICU Electrolyte Replacment Protocol based on the criteria listed below:  ? ?1.Exclusion criteria: TCTS patients, ECMO patients, and Dialysis patients ?2. Is GFR >/= 30 ml/min? Yes.    ?Patient's GFR today is >60 ?3. Is SCr </= 2? Yes.   ?Patient's SCr is 0.78 mg/dL ?4. Did SCr increase >/= 0.5 in 24 hours? No. ?5.Pt's weight >40kg  Yes.   ?6. Abnormal electrolyte(s):   K 3.3, Mg 1.8  ?7. Electrolytes replaced per protocol ?8.  Call MD STAT for K+ </= 2.5, Phos </= 1, or Mag </= 1 ?Physician:  S. Sommer ? ?Robin Guerrero 02/21/2022 3:24 AM ? ?

## 2022-02-21 NOTE — Care Management (Signed)
?  Transition of Care (TOC) Screening Note ? ? ?Patient Details  ?Name: Robin Guerrero ?Date of Birth: [birth year] ? ? ?Transition of Care (TOC) CM/SW Contact:    ?Graves-Bigelow, Ocie Cornfield, RN ?Phone Number: ?02/21/2022, 2:30 PM ? ? ? ?Transition of Care Department [facility]) has reviewed the patient. Case Manager received a consult for medication assistance. Patient has [employer]. Case Manager will not be able to assist with medication cost since the patient has insurance. Case Manager will continue to follow for additional transition of care needs.  ?

## 2022-02-22 ENCOUNTER — Other Ambulatory Visit: Payer: Self-pay

## 2022-02-22 ENCOUNTER — Other Ambulatory Visit (HOSPITAL_COMMUNITY): Payer: Self-pay

## 2022-02-22 ENCOUNTER — Inpatient Hospital Stay (HOSPITAL_COMMUNITY): Payer: 59

## 2022-02-22 ENCOUNTER — Encounter (HOSPITAL_COMMUNITY): Payer: Self-pay | Admitting: Pulmonary Disease

## 2022-02-22 DIAGNOSIS — I71012 Dissection of descending thoracic aorta: Secondary | ICD-10-CM

## 2022-02-22 LAB — BASIC METABOLIC PANEL
Anion gap: 9 (ref 5–15)
BUN: 9 mg/dL (ref 6–20)
CO2: 18 mmol/L — ABNORMAL LOW (ref 22–32)
Calcium: 8.9 mg/dL (ref 8.9–10.3)
Chloride: 108 mmol/L (ref 98–111)
Creatinine, Ser: 0.83 mg/dL (ref 0.44–1.00)
GFR, Estimated: >60 mL/min (ref >60)
Glucose, Bld: 108 mg/dL — ABNORMAL HIGH (ref 70–99)
Potassium: 3.8 mmol/L (ref 3.5–5.1)
Sodium: 135 mmol/L (ref 135–145)

## 2022-02-22 LAB — MAGNESIUM: Magnesium: 2 mg/dL (ref 1.7–2.4)

## 2022-02-22 MED ORDER — LOSARTAN POTASSIUM 25 MG PO TABS
25.0000 mg | ORAL_TABLET | Freq: Once | ORAL | Status: AC
  Administered 2022-02-22: 25 mg via ORAL
  Filled 2022-02-22: qty 1

## 2022-02-22 MED ORDER — LABETALOL HCL 5 MG/ML IV SOLN
10.0000 mg | INTRAVENOUS | Status: DC | PRN
  Administered 2022-02-22 (×4): 20 mg via INTRAVENOUS
  Administered 2022-02-23: 10 mg via INTRAVENOUS
  Administered 2022-02-23 – 2022-02-24 (×4): 20 mg via INTRAVENOUS
  Filled 2022-02-22 (×9): qty 4

## 2022-02-22 MED ORDER — LIDOCAINE-PRILOCAINE 2.5-2.5 % EX CREA
TOPICAL_CREAM | Freq: Once | CUTANEOUS | Status: AC
  Administered 2022-02-22: 1 via TOPICAL
  Filled 2022-02-22: qty 5

## 2022-02-22 MED ORDER — CLONIDINE HCL 0.2 MG PO TABS
0.2000 mg | ORAL_TABLET | Freq: Three times a day (TID) | ORAL | Status: DC
Start: 2022-02-22 — End: 2022-02-24
  Administered 2022-02-22 – 2022-02-24 (×6): 0.2 mg via ORAL
  Filled 2022-02-22 (×6): qty 1

## 2022-02-22 MED ORDER — CLONIDINE HCL 0.1 MG PO TABS
0.1000 mg | ORAL_TABLET | Freq: Three times a day (TID) | ORAL | 1 refills | Status: DC
  Filled 2022-02-22: qty 30, 10d supply, fill #0

## 2022-02-22 MED ORDER — SODIUM CHLORIDE 0.9 % IV SOLN: INTRAVENOUS | Status: DC | PRN

## 2022-02-22 MED ORDER — CLONIDINE HCL 0.1 MG PO TABS
0.1000 mg | ORAL_TABLET | Freq: Once | ORAL | Status: AC
Start: 2022-02-22 — End: 2022-02-22
  Administered 2022-02-22: 0.1 mg via ORAL
  Filled 2022-02-22: qty 1

## 2022-02-22 MED ORDER — IOHEXOL 350 MG/ML SOLN
100.0000 mL | Freq: Once | INTRAVENOUS | Status: AC | PRN
  Administered 2022-02-22: 100 mL via INTRAVENOUS

## 2022-02-22 MED ORDER — HYDRALAZINE HCL 20 MG/ML IJ SOLN
10.0000 mg | INTRAMUSCULAR | Status: DC | PRN
  Administered 2022-02-22 – 2022-02-24 (×2): 10 mg via INTRAVENOUS
  Filled 2022-02-22 (×2): qty 1

## 2022-02-22 MED ORDER — LABETALOL HCL 200 MG PO TABS
200.0000 mg | ORAL_TABLET | Freq: Two times a day (BID) | ORAL | Status: DC
  Administered 2022-02-23 (×2): 200 mg via ORAL
  Filled 2022-02-22 (×2): qty 1

## 2022-02-22 MED ORDER — LOSARTAN POTASSIUM 50 MG PO TABS
25.0000 mg | ORAL_TABLET | Freq: Every day | ORAL | 1 refills | Status: DC
  Filled 2022-02-22: qty 15, 30d supply, fill #0

## 2022-02-22 MED ORDER — AMLODIPINE BESYLATE 10 MG PO TABS
10.0000 mg | ORAL_TABLET | Freq: Every day | ORAL | 1 refills | Status: DC
  Filled 2022-02-22: qty 30, 30d supply, fill #0

## 2022-02-22 MED ORDER — LOSARTAN POTASSIUM 50 MG PO TABS
50.0000 mg | ORAL_TABLET | Freq: Every day | ORAL | Status: DC
  Administered 2022-02-23: 50 mg via ORAL
  Filled 2022-02-22: qty 1

## 2022-02-22 MED ORDER — LABETALOL HCL 5 MG/ML IV SOLN
INTRAVENOUS | Status: AC
  Administered 2022-02-22: 10 mg via INTRAVENOUS
  Filled 2022-02-22: qty 4

## 2022-02-22 MED ORDER — HYDRALAZINE HCL 100 MG PO TABS
100.0000 mg | ORAL_TABLET | Freq: Three times a day (TID) | ORAL | 1 refills | Status: DC
  Filled 2022-02-22: qty 90, 30d supply, fill #0

## 2022-02-22 MED ORDER — ENALAPRILAT 1.25 MG/ML IV SOLN
1.2500 mg | Freq: Once | INTRAVENOUS | Status: AC
  Administered 2022-02-23: 1.25 mg via INTRAVENOUS
  Filled 2022-02-22: qty 1

## 2022-02-22 MED ORDER — HYDRALAZINE HCL 50 MG PO TABS
100.0000 mg | ORAL_TABLET | Freq: Three times a day (TID) | ORAL | Status: DC
  Administered 2022-02-22 – 2022-02-24 (×7): 100 mg via ORAL
  Filled 2022-02-22 (×8): qty 2

## 2022-02-22 MED ORDER — LABETALOL HCL 5 MG/ML IV SOLN: 10.0000 mg | INTRAVENOUS | Status: DC | PRN

## 2022-02-22 NOTE — Progress Notes (Signed)
Subjective:  ?Feels well.  No shoulder/neck/chest pain. ?Of labetalol and diltiazem drips ? ?Objective:  ?Vital Signs in the last 24 hours: ?Temp:  [98.6 ?F (37 ?C)-99.3 ?F (37.4 ?C)] 99.1 ?F (37.3 ?C) (03/10 1515) ?Pulse Rate:  [71-93] 93 (03/10 1600) ?Resp:  [12-30] 20 (03/10 1600) ?BP: (89-139)/(57-103) 128/88 (03/10 1614) ?SpO2:  [89 %-96 %] 94 % (03/10 1600) ? ?Intake/Output from previous day: ?03/09 0701 - 03/10 0700 ?In: 1116.6 [P.O.:270; I.V.:717.3; IV Piggyback:129.3] ?Out: 100 [Urine:100] ? ?Physical Exam ?Vitals and nursing note reviewed.  ?Constitutional:   ?   General: She is not in acute distress. ?Neck:  ?   Vascular: No JVD.  ?Cardiovascular:  ?   Rate and Rhythm: Normal rate and regular rhythm.  ?   Pulses: Normal pulses.  ?   Heart sounds: Normal heart sounds. No murmur heard. ?Pulmonary:  ?   Effort: Pulmonary effort is normal.  ?   Breath sounds: Normal breath sounds. No wheezing or rales.  ?Musculoskeletal:  ?   Right lower leg: No edema.  ?   Left lower leg: No edema.  ? ? ?Cardiac Studies: ? ?Telemetry 02/22/2022: ?No arrhythmia ? ?Lab Results: ?Reviewed and interpreted: ?CBC, BMP ?  ?  ?  ?Assessment & Recommendations: ?  ?37 y.o. 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  ?  ?Type B dissection: ?Unclear of the stated flap is new or old. ?Continue medical management, submuscular surgery recommendations. ?  ?Hypertension: ?Now fairly well controlled, off labetalol and diltiazem drips. ?Increased hydralazine to 100 mg daily, and losartan to 50 mg daily.  I anticipate further improvement in blood pressure with this. ?Sent transition care pharmacy prescriptions for amlodipine 10 mg daily, Coreg 25 mg twice daily, clonidine 0.1 mg 3 times daily,  hydralazine 100 mg 3 times daily, losartan 50 mg daily. ?Outpatient follow-up arranged with me on 03/08/2022. ? ?Cardiology will sign off.  Please call back with any questions. ? ?Nigel Mormon, MD ?Pager: [phone] ?Office: [phone] ? ?

## 2022-02-22 NOTE — Progress Notes (Addendum)
[facility] [place] ? ?Subjective  - Left shoulder pain contiues to improve ? ? ?Objective ?126/75 ?77 ?98.6 ?F (37 ?C) (Oral) ?17 ?96% ? ?Intake/Output Summary (Last 24 hours) at 02/22/2022 0716 ?Last data filed at 02/22/2022 0600 ?Gross per 24 hour  ?Intake 1116.62 ml  ?Output 100 ml  ?Net 1016.62 ml  ? ? ?Palpable pedal and radial pulses B ?Abdomin soft, NTTP ?Heart rate RRR, BP stable 536'U systolic ?Lungs non labored breathing ? 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 ?BP stable Cardizem stopped, Labetalol 2 mg/min.  Contniue to wean off IV support and transition to home meds with changes as need to maintain BPsystolics less than 440 and goal heart rate less than 80 and closer to 60. ?Thanks for the Help from CCM. ? ?Repeat CTA ordered for today chest/abdomin/pelvis. ? ?Roxy Horseman ?02/22/2022 ?7:16 AM ?-- ? ?Laboratory ?Lab Results: ?Recent Labs  ?  02/20/22 ?1126 02/21/22 ?0131  ?WBC 8.6 6.7  ?HGB 10.8* 9.5*  ?HCT 33.7* 29.3*  ?PLT 250 233  ? ?BMET ?Recent Labs  ?  02/21/22 ?0131 02/22/22 ?3474  ?NA 139 135  ?K 3.3* 3.8  ?CL 109 108  ?CO2 22 18*  ?GLUCOSE 119* 108*  ?BUN 8 9  ?CREATININE 0.78 0.83  ?CALCIUM 8.6* 8.9  ? ? ?COAG ?Lab Results  ?Component Value Date  ? INR 1.1 08/27/2020  ? ?No results found for: PTT ? ?VASCULAR STAFF ADDENDUM: ?I have independently interviewed and examined the patient. ?I agree with the above.  ?CT angiogram personally reviewed. Awaiting radiology read. Medical management for [facility]. OK for progressive care from my standpoint. ? ?[name]. Stanford Breed, MD ?[facility] [place] ?Office Phone Number: [phone] ?02/22/2022 10:21 AM ? ? ? ?

## 2022-02-22 NOTE — Progress Notes (Addendum)
? ?NAMECorrinne Guerrero, MRN:  [id number], DOB:  [birth year], LOS: 2 ?ADMISSION DATE:  02/20/2022 CONSULTATION DATE:  02/20/2022 ?REFERRING MD:  Eulis Foster - EDP CHIEF COMPLAINT:  Aortic dissection  ? ?History of Present Illness:  ?36 year old woman who presented to [facility] ED 3/8 with L-sided flank pain and L shoulder pain x 2 days. PMHx significant for HTN, acute-on-chronic pancreatitis, CKD stage IIIa (baseline Cr 1.5-2) and type B aortic dissection (diagnosed 10/2020). ? ?Patient states that she presented to the ED after 2 days of left side and neck pain; she was concerned because this pain was very similar to the pain she experienced with her previous aortic dissection diagnosis.  Reported to work today and did not feel well but attempted to make it through the day when her boss told her to go to the emergency department.  Reports left-sided neck and flank pain and tingling of her bilateral hands and feet (this happens "all the time" at home). Denies fever/chills, CP/SOB, dyspnea, n/v, headache.  Denies LE swelling at this time, but notes this does come and go, especially since she stopped taking her HCTZ.  She feels her blood pressure was better controlled on her prior agents (losartan and HCTZ).  Her blood pressure regimen was adjusted during her last hospitalization and she has had difficulty obtaining her medications (specifically carvedilol) and frequently misses doses of this.  She has not been checking her blood pressures at home and does not know what they are normally. ? ?On ED arrival, patient was afebrile, tachycardic to 110s and hypertensive with SBP 150s to 160s.  Labs were notable for normal CBC, unremarkable CMP; COVID/flu negative, beta-hCG negative. CTA demonstrated chronic type B aortic dissection (similar to prior) with new intimal flap within the aortic arch. VVS was consulted with recommendation for transfer to [facility] for further management. ? ?PCCM consulted for admission. ? ?Pertinent Medical History:   ?Descending thoracic dissection ?Anemia ?Hypertension ? ?Significant Hospital Events:  ?3/8 - Presented to [facility] with L neck/flank pain. Hypertensive to 160s. CTA with chronic type B aortic dissection with new intimal flap. Labetalol gtt started with poor BP control, transitioned to esmolol gtt. VVS consulted. Transferred to [facility]. ?3/9 no acute events overnight esmolol drip exchanged for Cardizem and labetalol ?3/10 no acute events overnight, Cardizem drip weaned off remains on very low-dose labetalol this a.m. ? ?Interim History / Subjective:  ?Seen lying in bed with no acute complaints.  States she is pain-free currently ? ?Objective:  ?Blood pressure 126/75, pulse 77, temperature 98.6 ?F (37 ?C), temperature source Oral, resp. rate 17, height 5' 9"  (1.753 m), weight 102.4 kg, SpO2 96 %, not currently breastfeeding. ?   ?   ? ?Intake/Output Summary (Last 24 hours) at 02/22/2022 0740 ?Last data filed at 02/22/2022 0600 ?Gross per 24 hour  ?Intake 1116.62 ml  ?Output 100 ml  ?Net 1016.62 ml  ? ? ?Filed Weights  ? 02/20/22 1029 02/20/22 1843  ?Weight: 108.9 kg 102.4 kg  ? ?Physical Examination: ?General: Pleasant adult female lying in bed in no acute distress ?HEENT: [place]/AT, MM pink/moist, PERRL,  ?Neuro: Alert and oriented, interactive this a.m. ?CV: s1s2 regular rate and rhythm, no murmur, rubs, or gallops,  ?PULM: Clear to auscultation bilaterally, no increased work of breathing, no added breath sounds ?GI: soft, bowel sounds active in all 4 quadrants, non-tender, non-distended, tolerating oral diet ?Extremities: warm/dry, no edema  ?Skin: no rashes or lesions ? ?Resolved Hospital Problem List:  ?Hypokalemia ? ?Assessment & Plan:  ?  Type B aortic dissection, acute-on-chronic with new intimal flap ?-Presented to [facility] ED 3/8 with two-day history of L-sided neck pain/flank pain. Reminiscent of previous aortic dissection presenting symptoms (10/2020). CTA demonstrating type B dissection (L [place] to R iliac) with new intimal  flap. ?P: ?Vascular surgery continues to follow, plan to repeat CT today ?SBP goal remains less than 120 ?Heart rate remains less than 80 with goal closer to 60 ?Continuous telemetry ?Continue to wean hypertension drips as able ? ?Hypertension, poorly controlled ?-Home medications include: amlodipine 98m daily, carvedilol 22mBID, hydralazine 7537mID, clonidine 0.1mg6mD, ?HCTZ 50mg26mly. Per patient, previously on losartan and HCTZ and felt BP was better controlled on these medications, stopped in the setting of ARF. Has had difficulty getting Coreg. ?P: ?Patient's primary cardiologist Dr. PatwaVirgina Jockulted for assistance ?Home Norvasc 2 mg daily, carvedilol 25 mg twice daily, clonidine 0.1 3 times daily, and hydralazine 75 mg 3 times daily resumed and will continue ?Per cardiology losartan 25 added 3/10  ?SBP and heart rate goal as above ? ?CKD stage IIIa ?-Baseline Cr variable, appears to be 1.5-2 most recently. Cr 0.73 on admission. ?P: ?Continue to follow renal function ?Avoid nephrotoxins ?Trend to be met ?Monitor urine output ? ?Tobacco use ?P: ?Cessation education ? ? ?Best Practice: (right click and "Reselect all SmartList Selections" daily)  ? ?Diet/type: NPO w/ oral meds ?DVT prophylaxis: SCD ?GI prophylaxis: N/A ?Lines: N/A ?Foley:  N/A ?Code Status:  full code ?Last date of multidisciplinary goals of care discussion [Pending] ? ?  ?Critical care time:   ? ?Performed by: Whitney D. Harris ? ?Total critical care time: 30 minutes ? ?Critical care time was exclusive of separately billable procedures and treating other patients. ? ?Critical care was necessary to treat or prevent imminent or life-threatening deterioration. ? ?Critical care was time spent personally by me on the following activities: development of treatment plan with patient and/or surrogate as well as nursing, discussions with consultants, evaluation of patient's response to treatment, examination of patient, obtaining history from  patient or surrogate, ordering and performing treatments and interventions, ordering and review of laboratory studies, ordering and review of radiographic studies, pulse oximetry and re-evaluation of patient's condition. ? ?Whitney D. Harris, NP-C ?[facility] ?Personal contact information can be found on Amion  ?02/22/2022, 7:40 AM ? ? ?Pulmonary critical care attending: ? ?This is a 36 ye38 old female admitted for a type B aortic dissection with a new intimal flap.  She is also hypertensive needing ICU admission for blood pressure control. ? ?BP 128/88   Pulse 93   Temp 99.1 ?F (37.3 ?C) (Oral)   Resp 20   Ht 5' 9"  (1.753 m)   Wt 102.4 kg   SpO2 94%   BMI 33.34 kg/m?   ?General: Young female resting comfortably in bed ?HEENT: NCAT, tracking appropriately ?Heart: Regular rate lungs: Clear to auscultation bilaterally ?Abdomen: Soft nontender distended. ? ?Labs reviewed ? ?Assessment:  ?Type B aortic dissection, acute on chronic intimal flap ?Hypertension, hypertensive urgency poorly controlled ?Stage III CKD ?Tobacco abuse ? ?Plan: ?Continue blood pressure goals ?Maintain systolic blood pressure less than 120 ?On infusion ?Continue as needed labetalol. ?Norvasc, carvedilol, clonidine, hydralazine, losartan ?Stable for transfer from intensive care unit ?Appreciate patient being picked up tomorrow by [facility] hospitalist service. ? ?BradlGarner Nash?[facility] ?02/22/2022 5:04 PM   ? ? ? ?

## 2022-02-22 NOTE — Progress Notes (Addendum)
eLink Physician-Brief Progress Note ?Patient Name: Robin Guerrero ?DOB: 1986-05-24 ?MRN: [id number] ? ? ?Date of Service ? 02/22/2022  ?HPI/Events of Note ? Notified of hypertension with BP at 144/97 despite hydralazine IV and labetalol boluses.  Pt is on amlodipine, carvedilol, clonidine, hydralazine PO, losartan 50 already.  ?eICU Interventions ? Give vasotec 1.'25mg'$  IV now and labetalol PO '200mg'$  BID starting now.   ?If BP still not at goal, would restart labetalol gtt.  ? ? ? ?Intervention Category ?Intermediate Interventions: Hypertension - evaluation and management ? ?Elsie Lincoln ?02/22/2022, 11:52 PM ?

## 2022-02-23 DIAGNOSIS — I10 Essential (primary) hypertension: Secondary | ICD-10-CM

## 2022-02-23 NOTE — Progress Notes (Signed)
?  Progress Note ? ? ? ?02/23/2022 ?1:28 PM ?* No surgery found * ? ?Subjective: Not having any further pain, wants to go home ? ?Vitals:  ? 02/23/22 1159 02/23/22 1200  ?BP:  106/75  ?Pulse:  76  ?Resp:  20  ?Temp: 98.8 ?F (37.1 ?C)   ?SpO2:  93%  ? ? ?Physical Exam: ?Awake alert oriented ?Nonlabored respirations ?Abdomen is soft and nontender ?Bilateral lower extremities are warm well perfused and there are palpable dorsalis pedis pulses ? ?CBC ?   ?Component Value Date/Time  ? WBC 6.7 02/21/2022 0131  ? RBC 3.01 (L) 02/21/2022 0131  ? HGB 9.5 (L) 02/21/2022 0131  ? HCT 29.3 (L) 02/21/2022 0131  ? PLT 233 02/21/2022 0131  ? MCV 97.3 02/21/2022 0131  ? MCH 31.6 02/21/2022 0131  ? MCHC 32.4 02/21/2022 0131  ? RDW 14.8 02/21/2022 0131  ? LYMPHSABS 1.0 02/20/2022 1126  ? MONOABS 0.6 02/20/2022 1126  ? EOSABS 0.1 02/20/2022 1126  ? BASOSABS 0.0 02/20/2022 1126  ? ? ?BMET ?   ?Component Value Date/Time  ? NA 135 02/22/2022 0054  ? K 3.8 02/22/2022 0054  ? CL 108 02/22/2022 0054  ? CO2 18 (L) 02/22/2022 0054  ? GLUCOSE 108 (H) 02/22/2022 0054  ? BUN 9 02/22/2022 0054  ? CREATININE 0.83 02/22/2022 0054  ? CALCIUM 8.9 02/22/2022 0054  ? GFRNONAA >60 02/22/2022 0054  ? GFRAA >60 08/27/2020 0351  ? ? ?INR ?   ?Component Value Date/Time  ? INR 1.1 08/27/2020 0953  ? ? ? ?Intake/Output Summary (Last 24 hours) at 02/23/2022 1328 ?Last data filed at 02/23/2022 0800 ?Gross per 24 hour  ?Intake 267.35 ml  ?Output 400 ml  ?Net -132.65 ml  ? ? ? ?Assessment/plan:  36 y.o. Guerrero is here with acute on chronic type B aortic dissection.  Repeat CT angio without significant change she is not currently having any pain or malperfusion.  When blood pressure is controlled okay for discharge from vascular standpoint we will have her follow-up in 4 weeks with repeat CT angio with Dr. Stanford Breed. ? ? ?Lori-Ann Lindfors C. Donzetta Matters, MD ?[facility] ?Office: [phone] ?Pager: [phone] ? ?02/23/2022 ?1:28 PM ? ?

## 2022-02-23 NOTE — Progress Notes (Addendum)
PROGRESS NOTE        PATIENT DETAILS Name: Robin Guerrero Age: 36 y.o. Sex: female Date of Birth: 08/10/1986 Admit Date: 02/20/2022 Admitting Physician Marshell Garfinkel, MD [id number], Maud Deed, PA  Brief Summary: Patient is a 36 y.o.  female HTN, chronic pancreatitis, type B chronic aortic dissection, -who presented with left flank pain/left shoulder pain-CTA demonstrated chronic type B aortic dissection (similar to prior) with new intimal flap within the aortic arch-was evaluated by vascular surgery-and subsequently admitted to the ICU by PCCM where she underwent aggressive BP control.  She was subsequently transferred to [facility] service on 3/11.   Significant events: 3/8>> presented with left neck/flank pain-found to have new intimal flap on top of chronic type B aortic dissection on CTA-transferred to [facility]-started on labetalol gtt. 3/10>> all antihypertensive drips discontinued with plans to transfer to [facility]. 3/10>> restarted on low-dose labetalol infusion. 3/11>> transfer to [facility]-titrated off Labetalol infusion  Significant studies: 3/8>> CT angio chest/abdomen/pelvis: Chronic type B dissection-new additional intimal flap within the aortic arch.  Enlargement of pancreatic tail well-defined margins-question distal pancreatitis. 3/10>> CT angio chest/abdomen/pelvis: Worsening atelectasis, similar questionable peripancreatic inflammatory changes involving distal pancreas.  Similar appearance of type B chronic dissection-stable appearance of newly described dissection flap.  Significant microbiology data: 3/8>> COVID/influenza PCR: Negative  Procedures: None  Consults: PCCM, vascular surgery, cardiology  Subjective: Lying comfortably in bed-denies any chest pain or shortness of breath.  Objective: Vitals: Blood pressure 100/72, pulse 92, temperature 98.4 F (36.9 C), temperature source Oral, resp. rate (!) 24, height '5\' 9"'$  (1.753 m), weight 102 kg, SpO2 93  %, not currently breastfeeding.   Exam: Gen Exam:Alert awake-not in any distress HEENT:atraumatic, normocephalic Chest: B/L clear to auscultation anteriorly CVS:S1S2 regular Abdomen:soft non tender, non distended Extremities:no edema Neurology: Non focal Skin: no rash  Pertinent Labs/Radiology: CBC Latest Ref Rng & Units 02/21/2022 02/20/2022 06/07/2021  WBC 4.0 - 10.5 K/uL 6.7 8.6 6.6  Hemoglobin 12.0 - 15.0 g/dL 9.5(L) 10.8(L) 7.7(L)  Hematocrit 36.0 - 46.0 % 29.3(L) 33.7(L) 22.4(L)  Platelets 150 - 400 K/uL 233 250 147(L)    Lab Results  Component Value Date   NA 135 02/22/2022   K 3.8 02/22/2022   CL 108 02/22/2022   CO2 18 (L) 02/22/2022      Assessment/Plan: Type B aortic dissection with new intimal flap: Vascular surgery following-goal SBP less than 120.  Attempt to titrate off labetalol infusion today-continue amlodipine, Coreg, clonidine, hydralazine and losartan (dosage of multiple anti-hypertensive's adjusted 3/10)  HTN: See above  Normocytic anemia: Unclear etiology-continue to monitor closely-transfuse if Hb <7.  Check anemia panel with a.m. labs.  CT suggestive of pancreatitis: No clinical features suggestive of pancreatitis at this point.  Tobacco use: Counseled  Obesity: Estimated body mass index is 33.21 kg/m as calculated from the following:   Height as of this encounter: '5\' 9"'$  (1.753 m).   Weight as of this encounter: 102 kg.   Code status:   Code Status: Full Code   DVT Prophylaxis: heparin injection 5,000 Units Start: 02/21/22 0600 SCDs Start: 02/20/22 1604   Family Communication: None at bedside   Disposition Plan: Status is: Inpatient Remains inpatient appropriate because: Titrate off labetalol-optimize blood pressure before discharge home   Planned Discharge Destination:Home   Diet: Diet Order  Diet Carb Modified Fluid consistency: Thin; Room service appropriate? Yes  Diet effective now                      Antimicrobial agents: Anti-infectives (From admission, onward)    None        MEDICATIONS: Scheduled Meds:  amLODipine  10 mg Oral Daily   carvedilol  25 mg Oral BID WC   Chlorhexidine Gluconate Cloth  6 each Topical Q0600   cloNIDine  0.2 mg Oral Q8H   dicyclomine  20 mg Oral BID   ferrous sulfate  325 mg Oral BID WC   heparin  5,000 Units Subcutaneous Q8H   hydrALAZINE  100 mg Oral Q8H   labetalol  200 mg Oral BID   losartan  50 mg Oral Daily   mouth rinse  15 mL Mouth Rinse BID   multivitamin with minerals  1 tablet Oral Daily   Continuous Infusions:  sodium chloride     labetalol (NORMODYNE) infusion 5 mg/mL 3 mg/min (02/23/22 0800)   PRN Meds:.sodium chloride, acetaminophen, docusate sodium, hydrALAZINE, HYDROcodone-acetaminophen, labetalol, polyethylene glycol, prochlorperazine   I have personally reviewed following labs and imaging studies  LABORATORY DATA: CBC: Recent Labs  Lab 02/20/22 1126 02/21/22 0131  WBC 8.6 6.7  NEUTROABS 6.9  --   HGB 10.8* 9.5*  HCT 33.7* 29.3*  MCV 98.3 97.3  PLT 250 836    Basic Metabolic Panel: Recent Labs  Lab 02/20/22 1126 02/21/22 0131 02/22/22 0054  NA 138 139 135  K 4.1 3.3* 3.8  CL 107 109 108  CO2 24 22 18*  GLUCOSE 159* 119* 108*  BUN '11 8 9  '$ CREATININE 0.73 0.78 0.83  CALCIUM 8.5* 8.6* 8.9  MG  --  1.8 2.0  PHOS  --  3.2  --     GFR: Estimated Creatinine Clearance: 119.1 mL/min (by C-G formula based on SCr of 0.83 mg/dL).  Liver Function Tests: Recent Labs  Lab 02/20/22 1126  AST 25  ALT 14  ALKPHOS 51  BILITOT 1.9*  PROT 7.5  ALBUMIN 3.4*   No results for input(s): LIPASE, AMYLASE in the last 168 hours. No results for input(s): AMMONIA in the last 168 hours.  Coagulation Profile: No results for input(s): INR, PROTIME in the last 168 hours.  Cardiac Enzymes: No results for input(s): CKTOTAL, CKMB, CKMBINDEX, TROPONINI in the last 168 hours.  BNP (last 3 results) No results for  input(s): PROBNP in the last 8760 hours.  Lipid Profile: No results for input(s): CHOL, HDL, LDLCALC, TRIG, CHOLHDL, LDLDIRECT in the last 72 hours.  Thyroid Function Tests: No results for input(s): TSH, T4TOTAL, FREET4, T3FREE, THYROIDAB in the last 72 hours.  Anemia Panel: No results for input(s): VITAMINB12, FOLATE, FERRITIN, TIBC, IRON, RETICCTPCT in the last 72 hours.  Urine analysis:    Component Value Date/Time   COLORURINE YELLOW 06/04/2021 0446   APPEARANCEUR HAZY (A) 06/04/2021 0446   LABSPEC 1.016 06/04/2021 0446   PHURINE 5.0 06/04/2021 0446   GLUCOSEU NEGATIVE 06/04/2021 0446   HGBUR SMALL (A) 06/04/2021 0446   BILIRUBINUR NEGATIVE 06/04/2021 0446   KETONESUR NEGATIVE 06/04/2021 0446   PROTEINUR 100 (A) 06/04/2021 0446   UROBILINOGEN 1.0 04/13/2014 1138   NITRITE NEGATIVE 06/04/2021 0446   LEUKOCYTESUR NEGATIVE 06/04/2021 0446    Sepsis Labs: Lactic Acid, Venous No results found for: LATICACIDVEN  MICROBIOLOGY: Recent Results (from the past 240 hour(s))  Resp Panel by RT-PCR (Flu A&B, Covid) Nasopharyngeal Swab  Status: None   Collection Time: 02/20/22 11:30 AM   Specimen: Nasopharyngeal Swab; Nasopharyngeal(NP) swabs in vial transport medium  Result Value Ref Range Status   SARS Coronavirus 2 by RT PCR NEGATIVE NEGATIVE Final    Comment: (NOTE) SARS-CoV-2 target nucleic acids are NOT DETECTED.  The SARS-CoV-2 RNA is generally detectable in upper respiratory specimens during the acute phase of infection. The lowest concentration of SARS-CoV-2 viral copies this assay can detect is 138 copies/mL. A negative result does not preclude SARS-Cov-2 infection and should not be used as the sole basis for treatment or other patient management decisions. A negative result may occur with  improper specimen collection/handling, submission of specimen other than nasopharyngeal swab, presence of viral mutation(s) within the areas targeted by this assay, and  inadequate number of viral copies(<138 copies/mL). A negative result must be combined with clinical observations, patient history, and epidemiological information. The expected result is Negative.  Fact Sheet for Patients:  EntrepreneurPulse.com.au  Fact Sheet for Healthcare Providers:  IncredibleEmployment.be  This test is no t yet approved or cleared by the [place] FDA and  has been authorized for detection and/or diagnosis of SARS-CoV-2 by FDA under an Emergency Use Authorization (EUA). This EUA will remain  in effect (meaning this test can be used) for the duration of the COVID-19 declaration under Section 564(b)(1) of the Act, 21 U.S.C.section 360bbb-3(b)(1), unless the authorization is terminated  or revoked sooner.       Influenza A by PCR NEGATIVE NEGATIVE Final   Influenza B by PCR NEGATIVE NEGATIVE Final    Comment: (NOTE) The Xpert Xpress SARS-CoV-2/FLU/RSV plus assay is intended as an aid in the diagnosis of influenza from Nasopharyngeal swab specimens and should not be used as a sole basis for treatment. Nasal washings and aspirates are unacceptable for Xpert Xpress SARS-CoV-2/FLU/RSV testing.  Fact Sheet for Patients: EntrepreneurPulse.com.au  Fact Sheet for Healthcare Providers: IncredibleEmployment.be  This test is not yet approved or cleared by the [place] FDA and has been authorized for detection and/or diagnosis of SARS-CoV-2 by FDA under an Emergency Use Authorization (EUA). This EUA will remain in effect (meaning this test can be used) for the duration of the COVID-19 declaration under Section 564(b)(1) of the Act, 21 U.S.C. section 360bbb-3(b)(1), unless the authorization is terminated or revoked.  Performed at [facility], [place] [address], [place], [place] [address]   MRSA Next Gen by PCR, Nasal     Status: None   Collection Time: 02/20/22 10:00  PM   Specimen: Nasal Mucosa; Nasal Swab  Result Value Ref Range Status   MRSA by PCR Next Gen NOT DETECTED NOT DETECTED Final    Comment: (NOTE) The GeneXpert MRSA Assay (FDA approved for NASAL specimens only), is one component of a comprehensive MRSA colonization surveillance program. It is not intended to diagnose MRSA infection nor to guide or monitor treatment for MRSA infections. Test performance is not FDA approved in patients less than 60 years old. Performed at [facility], [place] [address] [place], [place] [address]     RADIOLOGY STUDIES/RESULTS: CT Angio Chest/Abd/Pel for Dissection W and/or W/WO  Result Date: 02/22/2022 CLINICAL DATA:  Thoracic aortic dissection, follow up EXAM: CT ANGIOGRAPHY CHEST, ABDOMEN AND PELVIS TECHNIQUE: Non-contrast CT of the chest was initially obtained. Multidetector CT imaging through the chest, abdomen and pelvis was performed using the standard protocol during bolus administration of intravenous contrast. Multiplanar reconstructed images and MIPs were obtained and reviewed to evaluate the  vascular anatomy. RADIATION DOSE REDUCTION: This exam was performed according to the departmental dose-optimization program which includes automated exposure control, adjustment of the mA and/or kV according to patient size and/or use of iterative reconstruction technique. CONTRAST:  136m OMNIPAQUE IOHEXOL 350 MG/ML SOLN COMPARISON:  February 20, 2022, multiple priors FINDINGS: CTA CHEST Cardiovascular: Preferential opacification of the thoracic aorta. Again seen is the extensive type B aortic dissection which extends from the distal aortic arch to the right common iliac artery bifurcation. 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 Stable appearance of the newly described antegrade dissection flap of the distal aortic arch which approaches but does not involve the origin of the left subclavian artery (see key image). The ascending thoracic  aorta and supra-branch vessels remain normal in caliber without involvement by the dissection. The heart is normal in size. No pericardial effusion. The descending thoracic aorta is normal in caliber. Mediastinum/Nodes: Enlarging subcutaneous nodule/lymph node in the right upper axilla measuring 2.2 x 1.9 cm, previously 1.9 x 1.2 cm. There is an area of adjacent skin thickening (see key image). The thyroid gland appears normal. Lungs/Pleura: No pleural effusion. No pneumothorax. There is worsening atelectasis and volume loss of the bilateral lower lobes, left-greater-than-right. The left lower lobe is almost completely collapsed. CTA ABDOMEN AND PELVIS VASCULAR Aorta: Type B aortic dissection extends through the abdominal aorta into the right common iliac artery bifurcation. The abdominal aorta remains normal in caliber without aneurysm. The true and false lumens remain patent throughout. Celiac: Arises from the true lumen, patent. SMA: Arises from the true lumen, patent. IMA: Arises from the true lumen, patent. Renals: There are 2 right renal arteries which appear to arise from the false lumen. No findings of right renal malperfusion. The left renal artery arises from the true lumen and is patent. An accessory left renal artery supplying the lower pole also arises from the true lumen and is patent. Inflow: Type B dissection flap extends into the right common iliac artery and terminates at the iliac bifurcation. The left common iliac artery is normal in caliber and not involved by dissection. The bilateral external iliac arteries and femoral arteries are patent and normal in caliber. Veins: No obvious venous abnormality within the limitations of this arterial phase study. NON-VASCULAR Hepatobiliary: The liver is normal in size without focal abnormality. No intrahepatic or extrahepatic biliary ductal dilation. The gallbladder is decompressed. Spleen: Normal in size without focal abnormality. Pancreas: Questionable  mesenteric fat stranding surrounding the distal pancreas. Adrenals/Urinary Tract: Adrenal glands are unremarkable. Kidneys are normal, without renal calculi, focal lesion, or hydronephrosis. Bladder is unremarkable. Stomach/Bowel: The stomach, small bowel and large bowel are normal in caliber without abnormal wall thickening or surrounding inflammatory changes. The appendix is normal. Reproductive: Uterus and bilateral adnexa are unremarkable. Lymphatic: No enlarged lymph nodes in the abdomen or pelvis. Other: Minimal dependent fluid in the pelvis. No organized fluid collections. Musculoskeletal: No aggressive osseous lesions. The soft tissues are unremarkable. Review of the MIP images confirms the above findings. IMPRESSION: Vascular: 1. Similar appearance and extent of type B aortic dissection compared to exam 2 days prior. 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 Stable appearance of the newly described dissection flap which arises in an antegrade direction in the distal thoracic aortic arch and approaches but does not involve the left subclavian artery origin. Nonvascular: 1. Worsening atelectasis of the bilateral lower lobes. The left lower lobe is noted to be almost completely  collapsed. 2. Similar questionable peripancreatic inflammatory changes involving the distal pancreas. The mesenteric vessels appear widely patent without findings to suggest ischemic pancreatitis. Correlate clinically and with serum biomarkers. 3. Enlarging subcutaneous soft tissue nodule/lymph node in the right upper axilla. Note is made of an adjacent area of skin thickening, and this finding may represent a reactive lymph node due to inflammatory or infectious process. Correlate with exam findings. Electronically Signed   By: Albin Felling M.D.   On: 02/22/2022 16:01     LOS: 3 days   Oren Binet, MD  [facility]    To contact the attending provider between 7A-7P or the covering provider  during after hours 7P-7A, please log into the web site [email or web address] and access using universal [place] password for that web site. If you do not have the password, please call the hospital operator.  02/23/2022, 10:15 AM

## 2022-02-24 LAB — BASIC METABOLIC PANEL
Anion gap: 9 (ref 5–15)
BUN: 8 mg/dL (ref 6–20)
CO2: 20 mmol/L — ABNORMAL LOW (ref 22–32)
Calcium: 8.9 mg/dL (ref 8.9–10.3)
Chloride: 107 mmol/L (ref 98–111)
Creatinine, Ser: 0.79 mg/dL (ref 0.44–1.00)
GFR, Estimated: >60 mL/min (ref >60)
Glucose, Bld: 136 mg/dL — ABNORMAL HIGH (ref 70–99)
Potassium: 3.6 mmol/L (ref 3.5–5.1)
Sodium: 136 mmol/L (ref 135–145)

## 2022-02-24 LAB — IRON AND TIBC
Iron: 19 ug/dL — ABNORMAL LOW (ref 28–170)
Saturation Ratios: 5 % — ABNORMAL LOW (ref 10.4–31.8)
TIBC: 350 ug/dL (ref 250–450)
UIBC: 331 ug/dL

## 2022-02-24 LAB — CBC
HCT: 30 % — ABNORMAL LOW (ref 36.0–46.0)
Hemoglobin: 9.9 g/dL — ABNORMAL LOW (ref 12.0–15.0)
MCH: 31.5 pg (ref 26.0–34.0)
MCHC: 33 g/dL (ref 30.0–36.0)
MCV: 95.5 fL (ref 80.0–100.0)
Platelets: 275 10*3/uL (ref 150–400)
RBC: 3.14 MIL/uL — ABNORMAL LOW (ref 3.87–5.11)
RDW: 14.9 % (ref 11.5–15.5)
WBC: 7.9 10*3/uL (ref 4.0–10.5)
nRBC: 0 % (ref 0.0–0.2)

## 2022-02-24 LAB — RETICULOCYTES
Immature Retic Fract: 18.9 % — ABNORMAL HIGH (ref 2.3–15.9)
RBC.: 3.16 MIL/uL — ABNORMAL LOW (ref 3.87–5.11)
Retic Count, Absolute: 75.8 10*3/uL (ref 19.0–186.0)
Retic Ct Pct: 2.4 % (ref 0.4–3.1)

## 2022-02-24 LAB — VITAMIN B12: Vitamin B-12: 214 pg/mL (ref 180–914)

## 2022-02-24 LAB — FOLATE: Folate: 4.2 ng/mL — ABNORMAL LOW (ref >5.9)

## 2022-02-24 LAB — FERRITIN: Ferritin: 37 ng/mL (ref 11–307)

## 2022-02-24 MED ORDER — POTASSIUM CHLORIDE CRYS ER 20 MEQ PO TBCR
40.0000 meq | EXTENDED_RELEASE_TABLET | Freq: Once | ORAL | Status: AC
  Administered 2022-02-24: 40 meq via ORAL
  Filled 2022-02-24: qty 2

## 2022-02-24 MED ORDER — CLONIDINE HCL 0.2 MG PO TABS: 0.2000 mg | ORAL_TABLET | Freq: Three times a day (TID) | ORAL | 1 refills | Status: DC

## 2022-02-24 MED ORDER — LOSARTAN POTASSIUM 100 MG PO TABS: 100.0000 mg | ORAL_TABLET | Freq: Every day | ORAL | 1 refills | Status: DC

## 2022-02-24 MED ORDER — LOSARTAN POTASSIUM 50 MG PO TABS
100.0000 mg | ORAL_TABLET | Freq: Every day | ORAL | Status: DC
  Administered 2022-02-24: 100 mg via ORAL
  Filled 2022-02-24: qty 2

## 2022-02-24 NOTE — Discharge Summary (Signed)
Physician Discharge Summary  Robin Guerrero HYQ:[id number] DOB: 1986/10/22 DOA: 02/20/2022  PCP: Trey Sailors, PA  Admit date: 02/20/2022 Discharge date: 02/24/2022  Admitted From: Home Disposition:  Home  Recommendations for Outpatient Follow-up:  Follow-up with Dr. Stanford Breed in 4 weeks Follow-up with Dr. Virgina Jock 3/24  Discharge Condition: Stable, improved  CODE STATUS: Full  Diet recommendation: Heart healthy   Brief/Interim Summary: Patient is a 36 y.o. female HTN, chronic pancreatitis, type B chronic aortic dissection who presented with left flank pain/left shoulder pain. CTA demonstrated chronic type B aortic dissection (similar to prior) with new intimal flap within the aortic arch. She was evaluated by vascular surgery and subsequently admitted to the ICU by PCCM where she underwent aggressive BP control.  She was subsequently transferred to [facility] service on 3/11.    Significant events: 3/8>> presented with left neck/flank pain-found to have new intimal flap on top of chronic type B aortic dissection on CTA-transferred to [facility]-started on labetalol gtt. 3/10>> all antihypertensive drips discontinued with plans to transfer to [facility]. 3/10>> restarted on low-dose labetalol infusion. 3/11>> transfer to [facility]-titrated off Labetalol infusion.   Significant studies: 3/8>> CT angio chest/abdomen/pelvis: Chronic type B dissection-new additional intimal flap within the aortic arch.  Enlargement of pancreatic tail well-defined margins-question distal pancreatitis. 3/10>> CT angio chest/abdomen/pelvis: Worsening atelectasis, similar questionable peripancreatic inflammatory changes involving distal pancreas.  Similar appearance of type B chronic dissection-stable appearance of newly described dissection flap.   Significant microbiology data: 3/8>> COVID/influenza PCR: Negative  Discharge Diagnoses:  Principal Problem:   Aortic dissection (HCC) Active Problems:   Resistant  hypertension   Type B aortic dissection with new intimal flap: Appreciate vascular surgery.  Blood pressure remains well controlled this morning on oral antihypertensives.   HTN: See above   Normocytic anemia: Stable   CT suggestive of pancreatitis: No clinical features suggestive of pancreatitis at this point.   Tobacco use: Counseled   Obesity: Estimated body mass index is 33.21 kg/m as calculated from the following:   Height as of this encounter: '5\' 9"'$  (1.753 m).   Weight as of this encounter: 102 kg.   Discharge Instructions  Discharge Instructions     Call MD for:  difficulty breathing, headache or visual disturbances   Complete by: As directed    Call MD for:  extreme fatigue   Complete by: As directed    Call MD for:  persistant dizziness or light-headedness   Complete by: As directed    Call MD for:  persistant nausea and vomiting   Complete by: As directed    Call MD for:  severe uncontrolled pain   Complete by: As directed    Call MD for:  temperature >100.4   Complete by: As directed    Diet - low sodium heart healthy   Complete by: As directed    Discharge instructions   Complete by: As directed    You were cared for by a hospitalist during your hospital stay. If you have any questions about your discharge medications or the care you received while you were in the hospital after you are discharged, you can call the unit and ask to speak with the hospitalist on call if the hospitalist that took care of you is not available. Once you are discharged, your primary care physician will handle any further medical issues. Please note that NO REFILLS for any discharge medications will be authorized once you are discharged, as it is imperative that you return to your primary  care physician (or establish a relationship with a primary care physician if you do not have one) for your aftercare needs so that they can reassess your need for medications and monitor your lab values.    Increase activity slowly   Complete by: As directed       Allergies as of 02/24/2022       Reactions   Ativan [lorazepam] Other (See Comments)   Disorientated, combative        Medication List     STOP taking these medications    dicyclomine 20 MG tablet Commonly known as: BENTYL   prazosin 2 MG capsule Commonly known as: MINIPRESS   QUEtiapine 25 MG tablet Commonly known as: SEROQUEL       TAKE these medications    acetaminophen 325 MG tablet Commonly known as: TYLENOL Take 650 mg by mouth every 6 (six) hours as needed for mild pain, fever or headache.   amLODipine 10 MG tablet Commonly known as: NORVASC Take 1 tablet (10 mg total) by mouth daily.   carvedilol 25 MG tablet Commonly known as: COREG Take 1 tablet (25 mg total) by mouth 2 (two) times daily with a meal.   cloNIDine 0.2 MG tablet Commonly known as: CATAPRES Take 1 tablet (0.2 mg total) by mouth every 8 (eight) hours. What changed:  medication strength how much to take when to take this   ferrous sulfate 325 (65 FE) MG tablet Take 1 tablet (325 mg total) by mouth 2 (two) times daily with a meal. What changed: when to take this   hydrALAZINE 100 MG tablet Commonly known as: APRESOLINE Take 1 tablet (100 mg total) by mouth 3 (three) times daily. What changed:  medication strength how much to take how to take this when to take this   losartan 100 MG tablet Commonly known as: COZAAR Take 1 tablet (100 mg total) by mouth daily. Start taking on: February 25, 2022   multivitamin with minerals Tabs tablet Take 1 tablet by mouth daily.        Follow-up Information     Nigel Mormon, MD Follow up on 03/08/2022.   Specialties: Cardiology, Radiology Why: 9:30 AM Contact information: [place] [address], [place], [place] Follow up.   Specialty: Physician Assistant Contact information: [place] [address] [phone]         Cherre Robins, MD. Schedule an appointment as soon as possible for a visit in 1 month(s).   Specialties: Vascular Surgery, Interventional Cardiology Contact information: [address] [place] [address] [phone]                Allergies  Allergen Reactions   Ativan [Lorazepam] Other (See Comments)    Disorientated, combative    Procedures/Studies: DG Chest Port 1 View  Result Date: 02/21/2022 CLINICAL DATA:  Aortic dissection EXAM: PORTABLE CHEST 1 VIEW COMPARISON:  Chest CT from yesterday FINDINGS: Lower volume chest with hazy density at the bases attributed atelectasis. Cardiopericardial enlargement. History of aortic dissection. No convincing change in mediastinal contours when allowing for differences in technique. IMPRESSION: Lower volume chest with increased atelectasis. Electronically Signed   By: Jorje Guild M.D.   On: 02/21/2022 06:44   CT Angio Chest/Abd/Pel for Dissection W and/or W/WO  Result Date: 02/22/2022 CLINICAL DATA:  Thoracic aortic dissection, follow up EXAM: CT ANGIOGRAPHY CHEST, ABDOMEN AND PELVIS TECHNIQUE: Non-contrast  CT of the chest was initially obtained. Multidetector CT imaging through the chest, abdomen and pelvis was performed using the standard protocol during bolus administration of intravenous contrast. Multiplanar reconstructed images and MIPs were obtained and reviewed to evaluate the vascular anatomy. RADIATION DOSE REDUCTION: This exam was performed according to the departmental dose-optimization program which includes automated exposure control, adjustment of the mA and/or kV according to patient size and/or use of iterative reconstruction technique. CONTRAST:  143m OMNIPAQUE IOHEXOL 350 MG/ML SOLN COMPARISON:  February 20, 2022, multiple priors FINDINGS: CTA CHEST Cardiovascular: Preferential opacification of the thoracic aorta. Again seen is the extensive type B aortic dissection which  extends from the distal aortic arch to the right common iliac artery bifurcation. 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 Stable appearance of the newly described antegrade dissection flap of the distal aortic arch which approaches but does not involve the origin of the left subclavian artery (see key image). The ascending thoracic aorta and supra-branch vessels remain normal in caliber without involvement by the dissection. The heart is normal in size. No pericardial effusion. The descending thoracic aorta is normal in caliber. Mediastinum/Nodes: Enlarging subcutaneous nodule/lymph node in the right upper axilla measuring 2.2 x 1.9 cm, previously 1.9 x 1.2 cm. There is an area of adjacent skin thickening (see key image). The thyroid gland appears normal. Lungs/Pleura: No pleural effusion. No pneumothorax. There is worsening atelectasis and volume loss of the bilateral lower lobes, left-greater-than-right. The left lower lobe is almost completely collapsed. CTA ABDOMEN AND PELVIS VASCULAR Aorta: Type B aortic dissection extends through the abdominal aorta into the right common iliac artery bifurcation. The abdominal aorta remains normal in caliber without aneurysm. The true and false lumens remain patent throughout. Celiac: Arises from the true lumen, patent. SMA: Arises from the true lumen, patent. IMA: Arises from the true lumen, patent. Renals: There are 2 right renal arteries which appear to arise from the false lumen. No findings of right renal malperfusion. The left renal artery arises from the true lumen and is patent. An accessory left renal artery supplying the lower pole also arises from the true lumen and is patent. Inflow: Type B dissection flap extends into the right common iliac artery and terminates at the iliac bifurcation. The left common iliac artery is normal in caliber and not involved by dissection. The bilateral external iliac arteries and femoral arteries are  patent and normal in caliber. Veins: No obvious venous abnormality within the limitations of this arterial phase study. NON-VASCULAR Hepatobiliary: The liver is normal in size without focal abnormality. No intrahepatic or extrahepatic biliary ductal dilation. The gallbladder is decompressed. Spleen: Normal in size without focal abnormality. Pancreas: Questionable mesenteric fat stranding surrounding the distal pancreas. Adrenals/Urinary Tract: Adrenal glands are unremarkable. Kidneys are normal, without renal calculi, focal lesion, or hydronephrosis. Bladder is unremarkable. Stomach/Bowel: The stomach, small bowel and large bowel are normal in caliber without abnormal wall thickening or surrounding inflammatory changes. The appendix is normal. Reproductive: Uterus and bilateral adnexa are unremarkable. Lymphatic: No enlarged lymph nodes in the abdomen or pelvis. Other: Minimal dependent fluid in the pelvis. No organized fluid collections. Musculoskeletal: No aggressive osseous lesions. The soft tissues are unremarkable. Review of the MIP images confirms the above findings. IMPRESSION: Vascular: 1. Similar appearance and extent of type B aortic dissection compared to exam 2 days prior. 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 Stable appearance of the newly described dissection flap which arises  in an antegrade direction in the distal thoracic aortic arch and approaches but does not involve the left subclavian artery origin. Nonvascular: 1. Worsening atelectasis of the bilateral lower lobes. The left lower lobe is noted to be almost completely collapsed. 2. Similar questionable peripancreatic inflammatory changes involving the distal pancreas. The mesenteric vessels appear widely patent without findings to suggest ischemic pancreatitis. Correlate clinically and with serum biomarkers. 3. Enlarging subcutaneous soft tissue nodule/lymph node in the right upper axilla. Note is made of an adjacent area  of skin thickening, and this finding may represent a reactive lymph node due to inflammatory or infectious process. Correlate with exam findings. Electronically Signed   By: Albin Felling M.D.   On: 02/22/2022 16:01   CT Angio Chest/Abd/Pel for Dissection W and/or W/WO  Result Date: 02/20/2022 CLINICAL DATA:  LEFT shoulder and flank pain for 2 days, single episode of vomiting, acute aortic syndrome, history of descending thoracic aortic dissection EXAM: CT ANGIOGRAPHY CHEST, ABDOMEN AND PELVIS TECHNIQUE: Non-contrast CT of the chest was initially obtained. Multidetector CT imaging through the chest, abdomen and pelvis was performed using the standard protocol during bolus administration of intravenous contrast. Multiplanar reconstructed images and MIPs were obtained and reviewed to evaluate the vascular anatomy. RADIATION DOSE REDUCTION: This exam was performed according to the departmental dose-optimization program which includes automated exposure control, adjustment of the mA and/or kV according to patient size and/or use of iterative reconstruction technique. CONTRAST:  130m OMNIPAQUE IOHEXOL 350 MG/ML SOLN IV COMPARISON:  06/04/2021 FINDINGS: CTA CHEST FINDINGS Cardiovascular: Precontrast imaging shows aneurysmal dilatation of the distal aortic arch 4.7 cm diameter previously 4.3 cm. No discrete intramural hematoma identified. Following contrast, again identified dissection of the descending thoracic aorta beginning at the posterior aspect of the arch distal to the LEFT subclavian artery origin and extending through the aortic bifurcation. Additional linear filling defect identified within the aortic arch adjacent to the origin of the LEFT subclavian artery consistent with an additional intimal flap new since the previous exam. No extension of dissection into the proximal great vessels. Pulmonary arteries appear grossly patent. Heart size normal. No pericardial effusion. Mediastinum/Nodes: Base of cervical  region normal appearance. Esophagus unremarkable. No thoracic adenopathy. Lungs/Pleura: Subsegmental atelectasis BILATERAL lower lobes, minimally RIGHT upper lobe. No acute consolidation, pleural effusion, or pneumothorax. Musculoskeletal: Unremarkable Review of the MIP images confirms the above findings. CTA ABDOMEN AND PELVIS FINDINGS VASCULAR Aorta: Dissection of the descending thoracic aorta extending from aortic hiatus 3 aortic bifurcation into RIGHT common iliac artery. Aorta normal caliber. Celiac artery, SMA, and IMA arise from true lumen. LEFT renal artery arises from true lumen with RIGHT renal artery arising from false lumen. No para-aortic hemorrhage. No calcified plaque. Celiac: Patent SMA: Patent Renals: Patent bilaterally IMA: Patent Inflow: 1 patent Veins: Unopacified, poorly assessed. Review of the MIP images confirms the above findings. NON-VASCULAR Hepatobiliary: Gallbladder and liver normal appearance Pancreas: Enlargement of pancreatic tail with ill-defined margins question distal pancreatitis, recommend correlation with serum lipase. No focal mass. Spleen: Normal appearance Adrenals/Urinary Tract: Adrenal glands normal appearance. Kidneys, ureters, and bladder normal appearance Stomach/Bowel: Stomach and bowel loops normal appearance Lymphatic: No adenopathy Reproductive: Exophytic mass RIGHT lateral uterus question leiomyoma again seen. Adnexa unremarkable. Other: Small amount of nonspecific free pelvic fluid. No free air. No hernia. Musculoskeletal: Osseous structures unremarkable. Review of the MIP images confirms the above findings. IMPRESSION: Chronic type B aortic dissection extending from the posterior aspect of the arch distal to the LEFT subclavian artery origin  and extending through the aortic bifurcation into the RIGHT common iliac artery. New additional intimal flap is seen within the aortic arch adjacent to the origin of the LEFT subclavian artery consistent consistent with  dissection, uncertain if related to the previous episode of dissection or a new dissection. 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 This recommendation follows 2010 ACCF/AHA/AATS/ACR/ASA/SCA/SCAI/SIR/STS/SVM Guidelines for the Diagnosis and Management of Patients With Thoracic Aortic Disease. Circulation. 2010; 121: U882-C00. Aortic aneurysm NOS (ICD10-I71.9). Enlargement of pancreatic tail with ill-defined margins question distal pancreatitis, recommend correlation with serum lipase. Small amount of nonspecific free pelvic fluid. Critical Value/emergent results were called by telephone at the time of interpretation on 02/20/2022 at 2:26 pm to provider Daleen Bo MD, who verbally acknowledged these results. Electronically Signed   By: Lavonia Dana M.D.   On: 02/20/2022 14:28       Discharge Exam: Vitals:   02/24/22 0800 02/24/22 0842  BP: 116/79   Pulse: 86   Resp: 18   Temp:  98.6 F (37 C)  SpO2: 94%     General: Pt is alert, awake, not in acute distress Cardiovascular: RRR, S1/S2 +, no edema Respiratory: CTA bilaterally, no wheezing, no rhonchi, no respiratory distress, no conversational dyspnea  Abdominal: Soft, NT, ND, bowel sounds + Extremities: no edema, no cyanosis Psych: Normal mood and affect, stable judgement and insight     The results of significant diagnostics from this hospitalization (including imaging, microbiology, ancillary and laboratory) are listed below for reference.     Microbiology: Recent Results (from the past 240 hour(s))  Resp Panel by RT-PCR (Flu A&B, Covid) Nasopharyngeal Swab     Status: None   Collection Time: 02/20/22 11:30 AM   Specimen: Nasopharyngeal Swab; Nasopharyngeal(NP) swabs in vial transport medium  Result Value Ref Range Status   SARS Coronavirus 2 by RT PCR NEGATIVE NEGATIVE Final     Comment: (NOTE) SARS-CoV-2 target nucleic acids are NOT DETECTED.  The SARS-CoV-2 RNA is generally detectable in upper respiratory specimens during the acute phase of infection. The lowest concentration of SARS-CoV-2 viral copies this assay can detect is 138 copies/mL. A negative result does not preclude SARS-Cov-2 infection and should not be used as the sole basis for treatment or other patient management decisions. A negative result may occur with  improper specimen collection/handling, submission of specimen other than nasopharyngeal swab, presence of viral mutation(s) within the areas targeted by this assay, and inadequate number of viral copies(<138 copies/mL). A negative result must be combined with clinical observations, patient history, and epidemiological information. The expected result is Negative.  Fact Sheet for Patients:  EntrepreneurPulse.com.au  Fact Sheet for Healthcare Providers:  IncredibleEmployment.be  This test is no t yet approved or cleared by the [place] FDA and  has been authorized for detection and/or diagnosis of SARS-CoV-2 by FDA under an Emergency Use Authorization (EUA). This EUA will remain  in effect (meaning this test can be used) for the duration of the COVID-19 declaration under Section 564(b)(1) of the Act, 21 U.S.C.section 360bbb-3(b)(1), unless the authorization is terminated  or revoked sooner.       Influenza A by PCR NEGATIVE NEGATIVE Final   Influenza B by PCR NEGATIVE NEGATIVE Final    Comment: (NOTE) The Xpert Xpress SARS-CoV-2/FLU/RSV plus assay is intended as an aid in the diagnosis of influenza from Nasopharyngeal swab specimens and  should not be used as a sole basis for treatment. Nasal washings and aspirates are unacceptable for Xpert Xpress SARS-CoV-2/FLU/RSV testing.  Fact Sheet for Patients: EntrepreneurPulse.com.au  Fact Sheet for Healthcare  Providers: IncredibleEmployment.be  This test is not yet approved or cleared by the [place] FDA and has been authorized for detection and/or diagnosis of SARS-CoV-2 by FDA under an Emergency Use Authorization (EUA). This EUA will remain in effect (meaning this test can be used) for the duration of the COVID-19 declaration under Section 564(b)(1) of the Act, 21 U.S.C. section 360bbb-3(b)(1), unless the authorization is terminated or revoked.  Performed at [facility], [place] [address], [place], [place] [address]   MRSA Next Gen by PCR, Nasal     Status: None   Collection Time: 02/20/22 10:00 PM   Specimen: Nasal Mucosa; Nasal Swab  Result Value Ref Range Status   MRSA by PCR Next Gen NOT DETECTED NOT DETECTED Final    Comment: (NOTE) The GeneXpert MRSA Assay (FDA approved for NASAL specimens only), is one component of a comprehensive MRSA colonization surveillance program. It is not intended to diagnose MRSA infection nor to guide or monitor treatment for MRSA infections. Test performance is not FDA approved in patients less than 81 years old. Performed at [facility], [place] [address] [place], [place] [address]      Labs: BNP (last 3 results) No results for input(s): BNP in the last 8760 hours. Basic Metabolic Panel: Recent Labs  Lab 02/20/22 1126 02/21/22 0131 02/22/22 0054 02/23/22 2342  NA 138 139 135 136  K 4.1 3.3* 3.8 3.6  CL 107 109 108 107  CO2 24 22 18* 20*  GLUCOSE 159* 119* 108* 136*  BUN '11 8 9 8  '$ CREATININE 0.73 0.78 0.83 0.79  CALCIUM 8.5* 8.6* 8.9 8.9  MG  --  1.8 2.0  --   PHOS  --  3.2  --   --    Liver Function Tests: Recent Labs  Lab 02/20/22 1126  AST 25  ALT 14  ALKPHOS 51  BILITOT 1.9*  PROT 7.5  ALBUMIN 3.4*   No results for input(s): LIPASE, AMYLASE in the last 168 hours. No results for input(s): AMMONIA in the last 168 hours. CBC: Recent Labs  Lab 02/20/22 1126 02/21/22 0131  02/23/22 2342  WBC 8.6 6.7 7.9  NEUTROABS 6.9  --   --   HGB 10.8* 9.5* 9.9*  HCT 33.7* 29.3* 30.0*  MCV 98.3 97.3 95.5  PLT 250 233 275   Cardiac Enzymes: No results for input(s): CKTOTAL, CKMB, CKMBINDEX, TROPONINI in the last 168 hours. BNP: Invalid input(s): POCBNP CBG: No results for input(s): GLUCAP in the last 168 hours. D-Dimer No results for input(s): DDIMER in the last 72 hours. Hgb A1c No results for input(s): HGBA1C in the last 72 hours. Lipid Profile No results for input(s): CHOL, HDL, LDLCALC, TRIG, CHOLHDL, LDLDIRECT in the last 72 hours. Thyroid function studies No results for input(s): TSH, T4TOTAL, T3FREE, THYROIDAB in the last 72 hours.  Invalid input(s): FREET3 Anemia work up Recent Labs    02/23/22 2342  VITAMINB12 214  FOLATE 4.2*  FERRITIN 37  TIBC 350  IRON 19*  RETICCTPCT 2.4   Urinalysis    Component Value Date/Time   COLORURINE YELLOW 06/04/2021 0446   APPEARANCEUR HAZY (A) 06/04/2021 0446   LABSPEC 1.016 06/04/2021 0446   PHURINE 5.0 06/04/2021 0446   GLUCOSEU NEGATIVE 06/04/2021 0446   HGBUR SMALL (A) 06/04/2021  [place] 06/04/2021 [place] 06/04/2021 0446   PROTEINUR 100 (A) 06/04/2021 0446   UROBILINOGEN 1.0 04/13/2014 1138   NITRITE NEGATIVE 06/04/2021 0446   LEUKOCYTESUR NEGATIVE 06/04/2021 0446   Sepsis Labs Invalid input(s): PROCALCITONIN,  WBC,  LACTICIDVEN Microbiology Recent Results (from the past 240 hour(s))  Resp Panel by RT-PCR (Flu A&B, Covid) Nasopharyngeal Swab     Status: None   Collection Time: 02/20/22 11:30 AM   Specimen: Nasopharyngeal Swab; Nasopharyngeal(NP) swabs in vial transport medium  Result Value Ref Range Status   SARS Coronavirus 2 by RT PCR NEGATIVE NEGATIVE Final    Comment: (NOTE) SARS-CoV-2 target nucleic acids are NOT DETECTED.  The SARS-CoV-2 RNA is generally detectable in upper respiratory specimens during the acute phase of infection. The  lowest concentration of SARS-CoV-2 viral copies this assay can detect is 138 copies/mL. A negative result does not preclude SARS-Cov-2 infection and should not be used as the sole basis for treatment or other patient management decisions. A negative result may occur with  improper specimen collection/handling, submission of specimen other than nasopharyngeal swab, presence of viral mutation(s) within the areas targeted by this assay, and inadequate number of viral copies(<138 copies/mL). A negative result must be combined with clinical observations, patient history, and epidemiological information. The expected result is Negative.  Fact Sheet for Patients:  EntrepreneurPulse.com.au  Fact Sheet for Healthcare Providers:  IncredibleEmployment.be  This test is no t yet approved or cleared by the [place] FDA and  has been authorized for detection and/or diagnosis of SARS-CoV-2 by FDA under an Emergency Use Authorization (EUA). This EUA will remain  in effect (meaning this test can be used) for the duration of the COVID-19 declaration under Section 564(b)(1) of the Act, 21 U.S.C.section 360bbb-3(b)(1), unless the authorization is terminated  or revoked sooner.       Influenza A by PCR NEGATIVE NEGATIVE Final   Influenza B by PCR NEGATIVE NEGATIVE Final    Comment: (NOTE) The Xpert Xpress SARS-CoV-2/FLU/RSV plus assay is intended as an aid in the diagnosis of influenza from Nasopharyngeal swab specimens and should not be used as a sole basis for treatment. Nasal washings and aspirates are unacceptable for Xpert Xpress SARS-CoV-2/FLU/RSV testing.  Fact Sheet for Patients: EntrepreneurPulse.com.au  Fact Sheet for Healthcare Providers: IncredibleEmployment.be  This test is not yet approved or cleared by the [place] FDA and has been authorized for detection and/or diagnosis of SARS-CoV-2 by FDA under  an Emergency Use Authorization (EUA). This EUA will remain in effect (meaning this test can be used) for the duration of the COVID-19 declaration under Section 564(b)(1) of the Act, 21 U.S.C. section 360bbb-3(b)(1), unless the authorization is terminated or revoked.  Performed at [facility], [place] [address] [place], [place] [address]   MRSA Next Gen by PCR, Nasal     Status: None   Collection Time: 02/20/22 10:00 PM   Specimen: Nasal Mucosa; Nasal Swab  Result Value Ref Range Status   MRSA by PCR Next Gen NOT DETECTED NOT DETECTED Final    Comment: (NOTE) The GeneXpert MRSA Assay (FDA approved for NASAL specimens only), is one component of a comprehensive MRSA colonization surveillance program. It is not intended to diagnose MRSA infection nor to guide or monitor treatment for MRSA infections. Test performance is not FDA approved in patients less than 43 years old. Performed at [facility], [place] [address] [place], [place] [address]  Patient was seen and examined on the day of discharge and was found to be in stable condition. Time coordinating discharge: 35 minutes including assessment and coordination of care, as well as examination of the patient.   SIGNED:  Dessa Phi, DO [facility] 02/24/2022, 11:59 AM

## 2022-02-25 ENCOUNTER — Other Ambulatory Visit (HOSPITAL_COMMUNITY): Payer: Self-pay

## 2022-02-28 ENCOUNTER — Encounter (HOSPITAL_COMMUNITY): Payer: Self-pay | Admitting: Emergency Medicine

## 2022-02-28 ENCOUNTER — Emergency Department (HOSPITAL_COMMUNITY)
Admission: EM | Admit: 2022-02-28 | Discharge: 2022-02-28 | Disposition: A | Discharge status: Home / Self Care | Payer: 59 | Attending: Emergency Medicine | Admitting: Emergency Medicine

## 2022-02-28 ENCOUNTER — Emergency Department (HOSPITAL_COMMUNITY): Payer: 59

## 2022-02-28 DIAGNOSIS — K2970 Gastritis, unspecified, without bleeding: Secondary | ICD-10-CM | POA: Diagnosis not present

## 2022-02-28 DIAGNOSIS — Z79899 Other long term (current) drug therapy: Secondary | ICD-10-CM | POA: Diagnosis not present

## 2022-02-28 DIAGNOSIS — I10 Essential (primary) hypertension: Secondary | ICD-10-CM | POA: Insufficient documentation

## 2022-02-28 DIAGNOSIS — R079 Chest pain, unspecified: Secondary | ICD-10-CM | POA: Diagnosis present

## 2022-02-28 LAB — BASIC METABOLIC PANEL
Anion gap: 12 (ref 5–15)
BUN: 14 mg/dL (ref 6–20)
CO2: 20 mmol/L — ABNORMAL LOW (ref 22–32)
Calcium: 9.2 mg/dL (ref 8.9–10.3)
Chloride: 103 mmol/L (ref 98–111)
Creatinine, Ser: 1.16 mg/dL — ABNORMAL HIGH (ref 0.44–1.00)
GFR, Estimated: >60 mL/min (ref >60)
Glucose, Bld: 178 mg/dL — ABNORMAL HIGH (ref 70–99)
Potassium: 4 mmol/L (ref 3.5–5.1)
Sodium: 135 mmol/L (ref 135–145)

## 2022-02-28 LAB — CBC
HCT: 33.8 % — ABNORMAL LOW (ref 36.0–46.0)
Hemoglobin: 10.6 g/dL — ABNORMAL LOW (ref 12.0–15.0)
MCH: 30.5 pg (ref 26.0–34.0)
MCHC: 31.4 g/dL (ref 30.0–36.0)
MCV: 97.1 fL (ref 80.0–100.0)
Platelets: 369 10*3/uL (ref 150–400)
RBC: 3.48 MIL/uL — ABNORMAL LOW (ref 3.87–5.11)
RDW: 14.9 % (ref 11.5–15.5)
WBC: 12.8 10*3/uL — ABNORMAL HIGH (ref 4.0–10.5)
nRBC: 0 % (ref 0.0–0.2)

## 2022-02-28 LAB — HCG, SERUM, QUALITATIVE: Preg, Serum: NEGATIVE

## 2022-02-28 LAB — MAGNESIUM: Magnesium: 1.8 mg/dL (ref 1.7–2.4)

## 2022-02-28 MED ORDER — OMEPRAZOLE 20 MG PO CPDR
20.0000 mg | DELAYED_RELEASE_CAPSULE | Freq: Two times a day (BID) | ORAL | 0 refills | Status: DC
Start: 2022-02-28 — End: 2022-06-08

## 2022-02-28 MED ORDER — FAMOTIDINE 20 MG PO TABS
20.0000 mg | ORAL_TABLET | Freq: Once | ORAL | Status: AC
  Administered 2022-02-28: 20 mg via ORAL
  Filled 2022-02-28: qty 1

## 2022-02-28 MED ORDER — CARVEDILOL 25 MG PO TABS: 25.0000 mg | ORAL_TABLET | Freq: Two times a day (BID) | ORAL | 0 refills | Status: DC

## 2022-02-28 MED ORDER — FAMOTIDINE 40 MG/5ML PO SUSR
20.0000 mg | Freq: Once | ORAL | Status: DC
  Filled 2022-02-28: qty 2.5

## 2022-02-28 MED ORDER — IOHEXOL 350 MG/ML SOLN
100.0000 mL | Freq: Once | INTRAVENOUS | Status: AC | PRN
  Administered 2022-02-28: 100 mL via INTRAVENOUS

## 2022-02-28 NOTE — ED Triage Notes (Signed)
Patient with history aortic dissection last assessed by CT on 3/10 here with return of left sided chest pain with radiation into left arm that started yesterday. Patient states pain feels the same as it did on 3/10. Patient is alert, oriented, speaking in complete sentences and is in no apparent distress at this time. ?

## 2022-02-28 NOTE — ED Provider Notes (Addendum)
?[facility] ?Provider Note ? ? ?CSN: [id number] ?Arrival date & time: 02/28/22  1745 ? ?  ? ?History ? ?Chief Complaint  ?Patient presents with  ? Chest Pain  ? ? ?Robin Guerrero is a 36 y.o. female. ? ?Pt is a 36 yo female with hx of aortic dissection secondary to hypertension presenting to ED for chest pain. Pt admits to left sided chest pain with radiation to the back and the left upper extremity. Chart review demonstrates patient was just discharged on 02/24/22 after admission for four days for acute aortic dissection with new intimal flap of aortic arch with hx of chronic type B dissection. Pt states her pain is similar to her presentation at that time but less severe and located over the left chest/arm instead of the right side.  ? ?The history is provided by the patient. No language interpreter was used.  ?Chest Pain ?Associated symptoms: no abdominal pain, no back pain, no cough, no fever, no palpitations, no shortness of breath and no vomiting   ? ?  ? ?Home Medications ?Prior to Admission medications   ?Medication Sig Start Date End Date Taking? Authorizing Provider  ?acetaminophen (TYLENOL) 325 MG tablet Take 650 mg by mouth every 6 (six) hours as needed for mild pain, fever or headache.    [provider]  ?amLODipine (NORVASC) 10 MG tablet Take 1 tablet (10 mg total) by mouth daily. 02/22/22   Patwardhan, Reynold Bowen, MD  ?carvedilol (COREG) 25 MG tablet Take 1 tablet (25 mg total) by mouth 2 (two) times daily with a meal. 12/18/20   Ulyses Amor, PA-C  ?cloNIDine (CATAPRES) 0.2 MG tablet Take 1 tablet (0.2 mg total) by mouth every 8 (eight) hours. 02/24/22 04/25/22  Dessa Phi, DO  ?ferrous sulfate 325 (65 FE) MG tablet Take 1 tablet (325 mg total) by mouth 2 (two) times daily with a meal. ?Patient taking differently: Take 325 mg by mouth daily with breakfast. 06/07/21   Nita Sells, MD  ?hydrALAZINE (APRESOLINE) 100 MG tablet Take 1 tablet (100  mg total) by mouth 3 (three) times daily. 02/22/22 02/22/23  Patwardhan, Reynold Bowen, MD  ?losartan (COZAAR) 100 MG tablet Take 1 tablet (100 mg total) by mouth daily. 02/25/22   Dessa Phi, DO  ?Multiple Vitamin (MULTIVITAMIN WITH MINERALS) TABS tablet Take 1 tablet by mouth daily. 11/18/20   Allie Bossier, MD  ?   ? ?Allergies    ?Ativan [lorazepam]   ? ?Review of Systems   ?Review of Systems  ?Constitutional:  Negative for chills and fever.  ?HENT:  Negative for ear pain and sore throat.   ?Eyes:  Negative for pain and visual disturbance.  ?Respiratory:  Negative for cough and shortness of breath.   ?Cardiovascular:  Positive for chest pain. Negative for palpitations.  ?Gastrointestinal:  Negative for abdominal pain and vomiting.  ?Genitourinary:  Negative for dysuria and hematuria.  ?Musculoskeletal:  Negative for arthralgias and back pain.  ?Skin:  Negative for color change and rash.  ?Neurological:  Negative for seizures and syncope.  ?All other systems reviewed and are negative. ? ?Physical Exam ?Updated Vital Signs ?BP 106/70   Pulse 97   Temp 99.6 ?F (37.6 ?C) (Oral)   Resp 20   LMP 02/18/2022 (Approximate)   SpO2 95%  ?Physical Exam ?Vitals and nursing note reviewed.  ?Constitutional:   ?   General: She is not in acute distress. ?   Appearance: She is well-developed.  ?HENT:  ?  Head: Normocephalic and atraumatic.  ?Eyes:  ?   Conjunctiva/sclera: Conjunctivae normal.  ?Cardiovascular:  ?   Rate and Rhythm: Normal rate and regular rhythm.  ?   Pulses:     ?     Radial pulses are 2+ on the right side and 2+ on the left side.  ?     Dorsalis pedis pulses are 2+ on the right side and 2+ on the left side.  ?   Heart sounds: No murmur heard. ?Pulmonary:  ?   Effort: Pulmonary effort is normal. No respiratory distress.  ?   Breath sounds: Normal breath sounds.  ?Abdominal:  ?   Palpations: Abdomen is soft.  ?   Tenderness: There is no abdominal tenderness.  ?Musculoskeletal:     ?   General: No swelling.  ?    Cervical back: Neck supple.  ?Skin: ?   General: Skin is warm and dry.  ?   Capillary Refill: Capillary refill takes less than 2 seconds.  ?Neurological:  ?   Mental Status: She is alert.  ?   GCS: GCS eye subscore is 4. GCS verbal subscore is 5. GCS motor subscore is 6.  ?   Cranial Nerves: Cranial nerves 2-12 are intact.  ?   Sensory: Sensation is intact.  ?   Motor: Motor function is intact.  ?   Coordination: Coordination is intact.  ?   Gait: Gait is intact.  ?Psychiatric:     ?   Mood and Affect: Mood normal.  ? ? ?ED Results / Procedures / Treatments   ?Labs ?(all labs ordered are listed, but only abnormal results are displayed) ?Labs Reviewed  ?CBC - Abnormal; Notable for the following components:  ?    Result Value  ? WBC 12.8 (*)   ? RBC 3.48 (*)   ? Hemoglobin 10.6 (*)   ? HCT 33.8 (*)   ? All other components within normal limits  ?BASIC METABOLIC PANEL - Abnormal; Notable for the following components:  ? CO2 20 (*)   ? Glucose, Bld 178 (*)   ? Creatinine, Ser 1.16 (*)   ? All other components within normal limits  ?MAGNESIUM  ?HCG, SERUM, QUALITATIVE  ? ? ?EKG ?None ? ?Radiology ?DG Chest 2 View ? ?Result Date: 02/28/2022 ?CLINICAL DATA:  Chest pain. EXAM: CHEST - 2 VIEW COMPARISON:  February 21, 2022 FINDINGS: The heart size and mediastinal contours are unchanged. Linear opacities in the bilateral lung bases are favored reflect atelectasis. No visible pleural effusion or pneumothorax. The visualized skeletal structures are unchanged. IMPRESSION: Linear opacities in the bilateral lung bases are favored reflect atelectasis. Cardiomediastinal silhouette appears similar prior. Electronically Signed   By: Dahlia Bailiff M.D.   On: 02/28/2022 18:44  ? ?CT Angio Chest/Abd/Pel for Dissection W and/or Wo Contrast ? ?Result Date: 02/28/2022 ?CLINICAL DATA:  History of 2 prior aortic dissections common most recent last week. Repeat chest pain with radiation to back and arm. EXAM: CT ANGIOGRAPHY CHEST, ABDOMEN AND  PELVIS TECHNIQUE: Non-contrast CT of the chest was initially obtained. Multidetector CT imaging through the chest, abdomen and pelvis was performed using the standard protocol during bolus administration of intravenous contrast. Multiplanar reconstructed images and MIPs were obtained and reviewed to evaluate the vascular anatomy. RADIATION DOSE REDUCTION: This exam was performed according to the departmental dose-optimization program which includes automated exposure control, adjustment of the mA and/or kV according to patient size and/or use of iterative reconstruction technique. CONTRAST:  129m OMNIPAQUE  IOHEXOL 350 MG/ML SOLN COMPARISON:  02/22/2022. FINDINGS: CTA CHEST FINDINGS Cardiovascular: The heart is normal in size and there is a small pericardial effusion. 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 There is redemonstration of a stable small dissection flap in the distal aortic arch. There is a stable dissection flap in the posterior aortic arch distal to the takeoff of the left subclavian artery which extends into the upper abdomen, compatible with type B dissection. There is opacification of the true and false lumens. There is normal opacification of the aortic arch branch vessels. The pulmonary trunk is distended which may be associated with underlying pulmonary artery hypertension. Mediastinum/Nodes: No mediastinal or hilar lymphadenopathy. There is redemonstration of enlarged lymph node in the right axilla measuring 1.9 cm with overlying skin thickening, unchanged. The thyroid gland, trachea, and esophagus within normal limits. Lungs/Pleura: Patchy atelectasis or infiltrate is noted in the lower lobes at the lung bases. There is a 3 mm nodule in the right upper lobe, unchanged. There is a trace left pleural effusion. No pneumothorax Musculoskeletal: Mild degenerative changes are noted in the mid to lower thoracic spine.  No acute osseous abnormality. Review of the MIP images confirms the above findings. CTA ABDOMEN AND PELVIS FINDINGS VASCULAR Aorta: Dissection continues into the abdominal aorta and into the common iliac arter

## 2022-02-28 NOTE — ED Provider Triage Note (Signed)
Emergency Medicine Provider Triage Evaluation Note ? ?Robin Guerrero , a 36 y.o. female  was evaluated in triage.  Pt complains of left shoulder/left arm pain since yesterday.  Patient was recently discharged from the hospital on Sunday following aortic dissection.  She states the pain today feels similar to her previous episode.  She denies lightheadedness, palpitations, diaphoresis. ? ?Review of Systems  ?Positive: As above ?Negative: As above ? ?Physical Exam  ?BP 114/82 (BP Location: Right Arm)   Pulse (!) 115   Temp 99.6 ?F (37.6 ?C) (Oral)   Resp 18   SpO2 99%  ?Gen:   Awake, no distress   ?Resp:  Normal effort  ?MSK:   Moves extremities without difficulty  ?Other:   ? ?Medical Decision Making  ?Medically screening exam initiated at 6:14 PM.  Appropriate orders placed.  Robin Guerrero was informed that the remainder of the evaluation will be completed by another provider, this initial triage assessment does not replace that evaluation, and the importance of remaining in the ED until their evaluation is complete. ? ?Blood pressures on bilateral upper extremities are symmetrical.  Left arm with blood pressure of 104/66.  Right arm with blood pressure of 110/72.  Notified nursing to room patient as soon as possible. ?  Evlyn Courier, PA-C ?02/28/22 1816 ? ?

## 2022-02-28 NOTE — ED Notes (Signed)
Pt provided discharge instructions and prescription information. Pt was given the opportunity to ask questions and questions were answered. Discharge signature not obtained in the setting of the COVID-19 pandemic in order to reduce high touch surfaces.  ° °

## 2022-03-04 ENCOUNTER — Other Ambulatory Visit: Payer: Self-pay

## 2022-03-04 DIAGNOSIS — I71012 Dissection of descending thoracic aorta: Secondary | ICD-10-CM

## 2022-03-04 DIAGNOSIS — I71019 Dissection of thoracic aorta, unspecified: Secondary | ICD-10-CM

## 2022-03-06 ENCOUNTER — Other Ambulatory Visit (HOSPITAL_COMMUNITY): Payer: Self-pay

## 2022-03-08 ENCOUNTER — Encounter: Payer: Self-pay | Admitting: Cardiology

## 2022-03-08 ENCOUNTER — Ambulatory Visit: Payer: 59 | Admitting: Cardiology

## 2022-03-08 ENCOUNTER — Other Ambulatory Visit: Payer: Self-pay

## 2022-03-08 VITALS — BP 123/82 | HR 103 | Temp 98.0°F | Resp 16 | Ht 69.0 in | Wt 221.0 lb

## 2022-03-08 DIAGNOSIS — I10 Essential (primary) hypertension: Secondary | ICD-10-CM

## 2022-03-08 DIAGNOSIS — I71012 Dissection of descending thoracic aorta: Secondary | ICD-10-CM

## 2022-03-08 MED ORDER — HYDRALAZINE HCL 100 MG PO TABS: 100.0000 mg | ORAL_TABLET | Freq: Three times a day (TID) | ORAL | 3 refills | Status: DC

## 2022-03-08 MED ORDER — LOSARTAN POTASSIUM 50 MG PO TABS: 50.0000 mg | ORAL_TABLET | Freq: Every day | ORAL | 3 refills | Status: DC

## 2022-03-08 MED ORDER — CLONIDINE HCL 0.2 MG PO TABS: 0.2000 mg | ORAL_TABLET | Freq: Three times a day (TID) | ORAL | 3 refills | Status: DC

## 2022-03-08 MED ORDER — FERROUS SULFATE 325 (65 FE) MG PO TABS: 325.0000 mg | ORAL_TABLET | Freq: Every day | ORAL | 1 refills | Status: DC

## 2022-03-08 MED ORDER — AMLODIPINE BESYLATE 10 MG PO TABS: 10.0000 mg | ORAL_TABLET | Freq: Every day | ORAL | 3 refills | Status: DC

## 2022-03-08 MED ORDER — CARVEDILOL 25 MG PO TABS: 25.0000 mg | ORAL_TABLET | Freq: Two times a day (BID) | ORAL | 3 refills | Status: DC

## 2022-03-08 NOTE — Progress Notes (Signed)
? ?Follow up visit ? ?Subjective:  ? ?Robin Guerrero, female    DOB: 11-28-1986, 36 y.o.   MRN: [id number] ? ? ? ? ?HPI ? ?Chief Complaint  ?Patient presents with  ? Hypertension  ? Hospitalization Follow-up  ? ? ?36 y.o. African American female with malignant hypertension, chronic pancreatitis, h/o tobacco and alcohol abuse, anemia, type B  aortic dissection in 11/2020. ? 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 She was seen by vascular surgery. Dissection flap was thought to be more chronic than acute. Blood pressure was controlled, initially with IV drips, later switched to PO medications. Patient subsequently had ED visit with chest pain, ACS, dissection was excluded.  ? ?Patient has previously had difficulty getting medications due to missed office visits owing to her work. She is now complaint with her medical therapy, with blood pressure well controlled She has had confusion re: doses of losartan and clonidine. She has sometimes takes 50 mg, sometimes 100 mg losartan.  ? ? ? ? ?Current Outpatient Medications:  ?  acetaminophen (TYLENOL) 325 MG tablet, Take 650 mg by mouth every 6 (six) hours as needed for mild pain, fever or headache., Disp: , Rfl:  ?  amLODipine (NORVASC) 10 MG tablet, Take 1 tablet (10 mg total) by mouth daily., Disp: 30 tablet, Rfl: 1 ?  carvedilol (COREG) 25 MG tablet, Take 1 tablet (25 mg total) by mouth 2 (two) times daily with a meal., Disp: 60 tablet, Rfl: 0 ?  ferrous sulfate 325 (65 FE) MG tablet, Take 1 tablet (325 mg total) by mouth 2 (two) times daily with a meal. (Patient taking differently: Take 325 mg by mouth daily with breakfast.), Disp: 30 tablet, Rfl: 3 ?  hydrALAZINE (APRESOLINE) 100 MG tablet, Take 1 tablet (100 mg total) by mouth 3 (three) times daily., Disp: 90 tablet, Rfl: 1 ?  Multiple Vitamin (MULTIVITAMIN WITH MINERALS) TABS tablet, Take 1  tablet by mouth daily., Disp: 30 tablet, Rfl: 0 ?  omeprazole (PRILOSEC) 20 MG capsule, Take 1 capsule (20 mg total) by mouth 2 (two) times daily before a meal., Disp: 60 capsule, Rfl: 0 ?  cloNIDine (CATAPRES) 0.2 MG tablet, Take 1 tablet (0.2 mg total) by mouth every 8 (eight) hours., Disp: 90 tablet, Rfl: 1 ?  losartan (COZAAR) 100 MG tablet, Take 1 tablet (100 mg total) by mouth daily., Disp: 30 tablet, Rfl: 1  ? ?Cardiovascular & other pertient studies: ? ?Reviewed external labs and tests, independently interpreted ? ?EKG 03/08/2022: ?Sinus rhythm 97 bpm ?Left atrial enlargement ?Low voltage in precordial leads ? ? ? ?Recent labs: ?02/28/2022: ?Glucose 178, BUN/Cr 14/1.16. EGFR >60. Na/K 135/4.0. Rest of the CMP normal ?H/H 10.6/33.6. MCV 97. Platelets 369 ? ? ?Review of Systems  ?Cardiovascular:  Negative for chest pain, dyspnea on exertion, leg swelling, palpitations and syncope.  ? ?   ? ? ?Vitals:  ? 03/08/22 0949  ?BP: 123/82  ?Pulse: (!) 103  ?Resp: 16  ?Temp: 98 ?F (36.7 ?C)  ?SpO2: 98%  ? ? ?Body mass index is 32.64 kg/m?. Danley Danker Weights  ? 03/08/22 0949  ?Weight: 221 lb (100.2 kg)  ? ? ? ?Objective:  ? Physical Exam ?Vitals and nursing note reviewed.  ?Constitutional:   ?   General: She is not in acute distress. ?Neck:  ?   Vascular: No JVD.  ?Cardiovascular:  ?   Rate  and Rhythm: Normal rate and regular rhythm.  ?   Pulses:     ?     Radial pulses are 2+ on the right side and 2+ on the left side.  ?     Dorsalis pedis pulses are 2+ on the right side and 2+ on the left side.  ?   Heart sounds: Normal heart sounds. No murmur heard. ?Pulmonary:  ?   Effort: Pulmonary effort is normal.  ?   Breath sounds: Normal breath sounds. No wheezing or rales.  ?Musculoskeletal:  ?   Right lower leg: No edema.  ?   Left lower leg: No edema.  ? ? ? ? ? ?   ? ? ?Visit diagnoses: ?  ICD-10-CM   ?1. Primary hypertension  I10 EKG 12-Lead  ?  amLODipine (NORVASC) 10 MG tablet  ?  carvedilol (COREG) 25 MG tablet  ?  cloNIDine  (CATAPRES) 0.2 MG tablet  ?  ferrous sulfate 325 (65 FE) MG tablet  ?  hydrALAZINE (APRESOLINE) 100 MG tablet  ?  losartan (COZAAR) 50 MG tablet  ?  ?2. Descending thoracic dissection  I71.012 carvedilol (COREG) 25 MG tablet  ?  ?  ? ?Orders Placed This Encounter  ?Procedures  ? EKG 12-Lead  ? ? ? ?Meds ordered this encounter  ?Medications  ? amLODipine (NORVASC) 10 MG tablet  ?  Sig: Take 1 tablet (10 mg total) by mouth daily.  ?  Dispense:  90 tablet  ?  Refill:  3  ? carvedilol (COREG) 25 MG tablet  ?  Sig: Take 1 tablet (25 mg total) by mouth 2 (two) times daily with a meal.  ?  Dispense:  180 tablet  ?  Refill:  3  ?  Contact PCP for refill  ? cloNIDine (CATAPRES) 0.2 MG tablet  ?  Sig: Take 1 tablet (0.2 mg total) by mouth every 8 (eight) hours.  ?  Dispense:  270 tablet  ?  Refill:  3  ? ferrous sulfate 325 (65 FE) MG tablet  ?  Sig: Take 1 tablet (325 mg total) by mouth daily with breakfast.  ?  Dispense:  30 tablet  ?  Refill:  1  ? hydrALAZINE (APRESOLINE) 100 MG tablet  ?  Sig: Take 1 tablet (100 mg total) by mouth 3 (three) times daily.  ?  Dispense:  90 tablet  ?  Refill:  3  ? losartan (COZAAR) 50 MG tablet  ?  Sig: Take 1 tablet (50 mg total) by mouth daily.  ?  Dispense:  90 tablet  ?  Refill:  3  ?  ? ?Assessment & Recommendations:  ? ? ?36 y.o. African American female with malignant hypertension, chronic pancreatitis, h/o tobacco and alcohol abuse, anemia, type B  aortic dissection in 11/2020. ? ?Hypertension: ?Currently well controlled.  ?Recommend the following medications.  ?Amlodipine 10 mg daily, Coreg 25 mg twice daily, clonidine 0.2 mg 3 times daily, hydralazine 100 mg 3 times daily, losartan 50 mg daily. ? ?F/u in 4 weeks to ensure medication compliance. ? ? ?Nigel Mormon, MD ?Pager: [phone] ?Office: [phone] ?

## 2022-03-22 ENCOUNTER — Other Ambulatory Visit: Payer: Self-pay | Admitting: Cardiology

## 2022-03-22 DIAGNOSIS — I10 Essential (primary) hypertension: Secondary | ICD-10-CM

## 2022-03-29 ENCOUNTER — Ambulatory Visit
Admission: RE | Admit: 2022-03-29 | Discharge: 2022-03-29 | Disposition: A | Discharge status: Home / Self Care | Payer: 59 | Source: Ambulatory Visit | Attending: Vascular Surgery | Admitting: Vascular Surgery

## 2022-03-29 DIAGNOSIS — I71019 Dissection of thoracic aorta, unspecified: Secondary | ICD-10-CM

## 2022-03-29 DIAGNOSIS — I71012 Dissection of descending thoracic aorta: Secondary | ICD-10-CM

## 2022-03-29 MED ORDER — IOPAMIDOL (ISOVUE-370) INJECTION 76%
100.0000 mL | Freq: Once | INTRAVENOUS | Status: AC | PRN
  Administered 2022-03-29: 100 mL via INTRAVENOUS

## 2022-04-02 ENCOUNTER — Encounter: Payer: Self-pay | Admitting: Vascular Surgery

## 2022-04-02 ENCOUNTER — Ambulatory Visit (INDEPENDENT_AMBULATORY_CARE_PROVIDER_SITE_OTHER): Payer: 59 | Admitting: Vascular Surgery

## 2022-04-02 ENCOUNTER — Ambulatory Visit: Payer: 59 | Admitting: Vascular Surgery

## 2022-04-02 VITALS — BP 106/76 | HR 98 | Temp 98.8°F | Resp 20 | Ht 69.0 in | Wt 212.0 lb

## 2022-04-02 DIAGNOSIS — I71019 Dissection of thoracic aorta, unspecified: Secondary | ICD-10-CM

## 2022-04-02 NOTE — Progress Notes (Signed)
? ?ASSESSMENT & PLAN:  ?Robin Guerrero is a 36 y.o. female with stable type B aortic dissection.  Continue medical management with goal systolic blood pressure less than 130.  Goal heart rate less than 80.  Continue annual CTA surveillance to ensure no aneurysmal degeneration.  ? ?She was recently admitted to the hospital in mid March for left shoulder pain.  This was concerning in the setting of her known chronic type B aortic dissection.  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  The patient improved with medical therapy and was discharged. ? ?We a long discussion about prophylactic intervention for chronic type B dissection.  I quoted her the limited data available to [place] in this problem.  I explained that stenting could reduce the risk of aneurysmal degeneration and promote healthy remodeling of the thoracic aorta.  This does carry a 5% risk of stroke, which could be debilitating in this young woman. ? ?She is understandably nervous about her chronic type B dissection.  I encouraged her to avoid intervention if at all possible.  We will continue to monitor this in the outpatient setting. ? ?SUBJECTIVE:  ?Admitted to hospital 02/20/2022 to 02/24/2022 for left shoulder pain.  A CT angiogram was performed showing possibly new intimal flap immediately distal left subclavian artery.  She is now pain-free.  She is understandably anxious about her dissection and the intermittent need to be hospitalized. ? ?OBJECTIVE:  ?BP 106/76 (BP Location: Left Arm, Patient Position: Sitting, Cuff Size: Large)   Pulse 98   Temp 98.8 ?F (37.1 ?C)   Resp 20   Ht '5\' 9"'$  (1.753 m)   Wt 212 lb (96.2 kg)   SpO2 95%   BMI 31.31 kg/m?  ? ?Constitutional: well appearing. no acute distress. ?Cardiac: RRR. ?Vascular: 2+ DPs ?Pulmonary: unlabored ?Abdominal: soft ? ? ?  Latest Ref Rng & Units 02/28/2022  ?  6:21 PM 02/23/2022  ? 11:42 PM 02/21/2022  ?  1:31 AM  ?CBC  ?WBC 4.0 - 10.5 K/uL 12.8    7.9   6.7    ?Hemoglobin 12.0 - 15.0 g/dL 10.6   9.9   9.5    ?Hematocrit 36.0 - 46.0 % 33.8   30.0   29.3    ?Platelets 150 - 400 K/uL 369   275   233    ?  ? ? ?  Latest Ref Rng & Units 02/28/2022  ?  6:21 PM 02/23/2022  ? 11:42 PM 02/22/2022  ? 12:54 AM  ?CMP  ?Glucose 70 - 99 mg/dL 178   136   108    ?BUN 6 - 20 mg/dL '14   8   9    '$ ?Creatinine 0.44 - 1.00 mg/dL 1.16   0.79   0.83    ?Sodium 135 - 145 mmol/L 135   136   135    ?Potassium 3.5 - 5.1 mmol/L 4.0   3.6   3.8    ?Chloride 98 - 111 mmol/L 103   107   108    ?CO2 22 - 32 mmol/L '20   20   18    '$ ?Calcium 8.9 - 10.3 mg/dL 9.2   8.9   8.9    ? ? ?CrCl cannot be calculated (Patient's most recent lab result is older than the maximum 21 days allowed.). ? ?CTA personally reviewed.  The scan appears overall stable from the last image in March.  I see no evidence of aneurysmal  degeneration that would require intervention more urgently. ? ?[name]. Stanford Breed, MD ?[facility] ?Office Phone Number: [phone] ?04/02/2022 4:58 PM ? ? ? ?

## 2022-04-08 ENCOUNTER — Encounter (HOSPITAL_COMMUNITY): Payer: Self-pay

## 2022-04-08 ENCOUNTER — Emergency Department (HOSPITAL_COMMUNITY)
Admission: EM | Admit: 2022-04-08 | Discharge: 2022-04-08 | Disposition: A | Discharge status: Home / Self Care | Payer: 59 | Attending: Emergency Medicine | Admitting: Emergency Medicine

## 2022-04-08 ENCOUNTER — Other Ambulatory Visit: Payer: Self-pay

## 2022-04-08 ENCOUNTER — Ambulatory Visit: Payer: 59 | Admitting: Cardiology

## 2022-04-08 DIAGNOSIS — K860 Alcohol-induced chronic pancreatitis: Secondary | ICD-10-CM | POA: Diagnosis not present

## 2022-04-08 DIAGNOSIS — R1084 Generalized abdominal pain: Secondary | ICD-10-CM | POA: Diagnosis present

## 2022-04-08 HISTORY — DX: Acute pancreatitis without necrosis or infection, unspecified: K85.90

## 2022-04-08 LAB — CBC WITH DIFFERENTIAL/PLATELET
Abs Immature Granulocytes: 0.02 10*3/uL (ref 0.00–0.07)
Basophils Absolute: 0 10*3/uL (ref 0.0–0.1)
Basophils Relative: 0 %
Eosinophils Absolute: 0.1 10*3/uL (ref 0.0–0.5)
Eosinophils Relative: 2 %
HCT: 32 % — ABNORMAL LOW (ref 36.0–46.0)
Hemoglobin: 10.3 g/dL — ABNORMAL LOW (ref 12.0–15.0)
Immature Granulocytes: 0 %
Lymphocytes Relative: 18 %
Lymphs Abs: 1.1 10*3/uL (ref 0.7–4.0)
MCH: 30.2 pg (ref 26.0–34.0)
MCHC: 32.2 g/dL (ref 30.0–36.0)
MCV: 93.8 fL (ref 80.0–100.0)
Monocytes Absolute: 0.4 10*3/uL (ref 0.1–1.0)
Monocytes Relative: 6 %
Neutro Abs: 4.5 10*3/uL (ref 1.7–7.7)
Neutrophils Relative %: 74 %
Platelets: 297 10*3/uL (ref 150–400)
RBC: 3.41 MIL/uL — ABNORMAL LOW (ref 3.87–5.11)
RDW: 17.8 % — ABNORMAL HIGH (ref 11.5–15.5)
WBC: 6.1 10*3/uL (ref 4.0–10.5)
nRBC: 0 % (ref 0.0–0.2)

## 2022-04-08 LAB — LIPASE, BLOOD: Lipase: 166 U/L — ABNORMAL HIGH (ref 11–51)

## 2022-04-08 LAB — URINALYSIS, COMPLETE (UACMP) WITH MICROSCOPIC
Bilirubin Urine: NEGATIVE
Glucose, UA: NEGATIVE mg/dL
Hgb urine dipstick: NEGATIVE
Ketones, ur: NEGATIVE mg/dL
Leukocytes,Ua: NEGATIVE
Nitrite: NEGATIVE
Protein, ur: 30 mg/dL — AB
Specific Gravity, Urine: 1.016 (ref 1.005–1.030)
pH: 5 (ref 5.0–8.0)

## 2022-04-08 LAB — COMPREHENSIVE METABOLIC PANEL
ALT: 16 U/L (ref 0–44)
AST: 15 U/L (ref 15–41)
Albumin: 3.9 g/dL (ref 3.5–5.0)
Alkaline Phosphatase: 59 U/L (ref 38–126)
Anion gap: 9 (ref 5–15)
BUN: 10 mg/dL (ref 6–20)
CO2: 18 mmol/L — ABNORMAL LOW (ref 22–32)
Calcium: 9.2 mg/dL (ref 8.9–10.3)
Chloride: 112 mmol/L — ABNORMAL HIGH (ref 98–111)
Creatinine, Ser: 0.66 mg/dL (ref 0.44–1.00)
GFR, Estimated: >60 mL/min (ref >60)
Glucose, Bld: 95 mg/dL (ref 70–99)
Potassium: 3.4 mmol/L — ABNORMAL LOW (ref 3.5–5.1)
Sodium: 139 mmol/L (ref 135–145)
Total Bilirubin: 0.8 mg/dL (ref 0.3–1.2)
Total Protein: 8.1 g/dL (ref 6.5–8.1)

## 2022-04-08 MED ORDER — METOCLOPRAMIDE HCL 10 MG PO TABS: 10.0000 mg | ORAL_TABLET | Freq: Four times a day (QID) | ORAL | 0 refills | Status: DC

## 2022-04-08 MED ORDER — OXYCODONE HCL 5 MG PO TABS: 5.0000 mg | ORAL_TABLET | Freq: Four times a day (QID) | ORAL | 0 refills | Status: DC | PRN

## 2022-04-08 NOTE — Progress Notes (Incomplete)
? ?Follow up visit ? ?Subjective:  ? ?Robin Guerrero, female    DOB: 1986/12/02, 36 y.o.   MRN: [id number] ? ? ? ? ?HPI ? ?No chief complaint on file. ? ? ?36 y.o. African American female with malignant hypertension, chronic pancreatitis, h/o tobacco and alcohol abuse, anemia, type B  aortic dissection in 11/2020. ? 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 She was seen by vascular surgery. Dissection flap was thought to be more chronic than acute. Blood pressure was controlled, initially with IV drips, later switched to PO medications. Patient subsequently had ED visit with chest pain, ACS, dissection was excluded.  ? ?Patient has previously had difficulty getting medications due to missed office visits owing to her work. She is now complaint with her medical therapy, with blood pressure well controlled She has had confusion re: doses of losartan and clonidine. She has sometimes takes 50 mg, sometimes 100 mg losartan.  ? ? ? ? ?Current Outpatient Medications:  ??  acetaminophen (TYLENOL) 325 MG tablet, Take 650 mg by mouth every 6 (six) hours as needed for mild pain, fever or headache., Disp: , Rfl:  ??  amLODipine (NORVASC) 10 MG tablet, Take 1 tablet (10 mg total) by mouth daily., Disp: 90 tablet, Rfl: 3 ??  carvedilol (COREG) 25 MG tablet, Take 1 tablet (25 mg total) by mouth 2 (two) times daily with a meal., Disp: 180 tablet, Rfl: 3 ??  cloNIDine (CATAPRES) 0.2 MG tablet, Take 1 tablet (0.2 mg total) by mouth every 8 (eight) hours., Disp: 270 tablet, Rfl: 3 ??  ferrous sulfate 325 (65 FE) MG tablet, TAKE 1 TABLET BY MOUTH EVERY DAY WITH BREAKFAST, Disp: 90 tablet, Rfl: 1 ??  hydrALAZINE (APRESOLINE) 100 MG tablet, Take 1 tablet (100 mg total) by mouth 3 (three) times daily., Disp: 90 tablet, Rfl: 3 ??  losartan (COZAAR) 50 MG tablet, Take 1 tablet (50 mg total) by mouth daily., Disp: 90  tablet, Rfl: 3 ??  Multiple Vitamin (MULTIVITAMIN WITH MINERALS) TABS tablet, Take 1 tablet by mouth daily., Disp: 30 tablet, Rfl: 0 ??  omeprazole (PRILOSEC) 20 MG capsule, Take 1 capsule (20 mg total) by mouth 2 (two) times daily before a meal., Disp: 60 capsule, Rfl: 0  ? ?Cardiovascular & other pertient studies: ? ?Reviewed external labs and tests, independently interpreted ? ?EKG 03/08/2022: ?Sinus rhythm 97 bpm ?Left atrial enlargement ?Low voltage in precordial leads ? ? ? ?Recent labs: ?02/28/2022: ?Glucose 178, BUN/Cr 14/1.16. EGFR >60. Na/K 135/4.0. Rest of the CMP normal ?H/H 10.6/33.6. MCV 97. Platelets 369 ? ? ?Review of Systems  ?Cardiovascular:  Negative for chest pain, dyspnea on exertion, leg swelling, palpitations and syncope.  ? ?   ? ? ?There were no vitals filed for this visit. ? ? ?There is no height or weight on file to calculate BMI. ?There were no vitals filed for this visit. ? ? ? ?Objective:  ? Physical Exam ?Vitals and nursing note reviewed.  ?Constitutional:   ?   General: She is not in acute distress. ?Neck:  ?   Vascular: No JVD.  ?Cardiovascular:  ?   Rate and Rhythm: Normal rate and regular rhythm.  ?   Pulses:     ?     Radial pulses are 2+ on the right side and 2+ on the left side.  ?     Dorsalis pedis  pulses are 2+ on the right side and 2+ on the left side.  ?   Heart sounds: Normal heart sounds. No murmur heard. ?Pulmonary:  ?   Effort: Pulmonary effort is normal.  ?   Breath sounds: Normal breath sounds. No wheezing or rales.  ?Musculoskeletal:  ?   Right lower leg: No edema.  ?   Left lower leg: No edema.  ? ? ? ? ? ?   ? ? ?Visit diagnoses: ?No diagnosis found. ?  ? ?No orders of the defined types were placed in this encounter. ? ? ? ?No orders of the defined types were placed in this encounter. ?  ? ?Assessment & Recommendations:  ? ? ?36 y.o. African American female with malignant hypertension, chronic pancreatitis, h/o tobacco and alcohol abuse, anemia, type B  aortic  dissection in 11/2020. ? ?Hypertension: ?Currently well controlled.  ?Recommend the following medications.  ?Amlodipine 10 mg daily, Coreg 25 mg twice daily, clonidine 0.2 mg 3 times daily, hydralazine 100 mg 3 times daily, losartan 50 mg daily. ? ?F/u in 4 weeks to ensure medication compliance. ? ? ?Nigel Mormon, MD ?Pager: [phone] ?Office: [phone] ?

## 2022-04-08 NOTE — Discharge Instructions (Addendum)
Call make an appointment with [facility] gastroenterology.  You can see Dr.Magod or any of his partners.  Tell them that you were seen in the emergency department and the doctor in the emergency department felt like he needed to be seen within the next 2 weeks ?

## 2022-04-08 NOTE — ED Provider Triage Note (Signed)
Emergency Medicine Provider Triage Evaluation Note ? ?Robin Guerrero , a 36 y.o. female  was evaluated in triage.  Pt complains of abdominal pain since March.  Patient reports history of pancreatitis as well as aortic aneurysm.  Patient denies being seen by GI.  Patient states that she was recently seen and diagnosed with gastritis.  Patient endorsing abdominal pain, nausea, vomiting, constipation.  Patient denies any fevers, body aches, chills. ? ?Review of Systems  ?Positive:  ?Negative:  ? ?Physical Exam  ?BP (!) 126/93 (BP Location: Left Arm)   Pulse (!) 107   Temp 99.1 ?F (37.3 ?C) (Oral)   Resp 18   Ht '5\' 9"'$  (1.753 m)   Wt 95.7 kg   LMP 03/19/2022 (Approximate)   SpO2 100%   BMI 31.16 kg/m?  ?Gen:   Awake, no distress   ?Resp:  Normal effort  ?MSK:   Moves extremities without difficulty  ?Other:   ? ?Medical Decision Making  ?Medically screening exam initiated at 12:50 PM.  Appropriate orders placed.  Robin Guerrero was informed that the remainder of the evaluation will be completed by another provider, this initial triage assessment does not replace that evaluation, and the importance of remaining in the ED until their evaluation is complete. ? ? ?  ?Robin Cecil, PA-C ?04/08/22 1251 ? ?

## 2022-04-08 NOTE — ED Provider Notes (Signed)
?[facility] DEPT ?Provider Note ? ? ?CSN: [id number] ?Arrival date & time: 04/08/22  1101 ? ?  ? ?History ? ?Chief Complaint  ?Patient presents with  ? Abdominal Pain  ? ? ?Robin Guerrero is a 36 y.o. female. ? ?Patient has a history of pancreatitis.  Patient comes the emergency room with abdominal pain and nausea vomiting that has been going on for months ? ?The history is provided by the patient and medical records. No language interpreter was used.  ?Abdominal Pain ?Pain location:  Generalized ?Pain quality: aching   ?Pain radiates to:  Does not radiate ?Pain severity:  Mild ?Onset quality:  Gradual ?Timing:  Constant ?Progression:  Waxing and waning ?Chronicity:  Recurrent ?Context: alcohol use   ?Associated symptoms: no chest pain, no cough, no diarrhea, no fatigue and no hematuria   ? ?  ? ?Home Medications ?Prior to Admission medications   ?Medication Sig Start Date End Date Taking? Authorizing Provider  ?metoCLOPramide (REGLAN) 10 MG tablet Take 1 tablet (10 mg total) by mouth every 6 (six) hours. 04/08/22  Yes Milton Ferguson, MD  ?oxyCODONE (ROXICODONE) 5 MG immediate release tablet Take 1 tablet (5 mg total) by mouth every 6 (six) hours as needed for severe pain. 04/08/22  Yes Milton Ferguson, MD  ?acetaminophen (TYLENOL) 325 MG tablet Take 650 mg by mouth every 6 (six) hours as needed for mild pain, fever or headache.    [provider]  ?amLODipine (NORVASC) 10 MG tablet Take 1 tablet (10 mg total) by mouth daily. 03/08/22   Patwardhan, Reynold Bowen, MD  ?carvedilol (COREG) 25 MG tablet Take 1 tablet (25 mg total) by mouth 2 (two) times daily with a meal. 03/08/22   Patwardhan, Manish J, MD  ?cloNIDine (CATAPRES) 0.2 MG tablet Take 1 tablet (0.2 mg total) by mouth every 8 (eight) hours. 03/08/22 05/07/22  Patwardhan, Reynold Bowen, MD  ?ferrous sulfate 325 (65 FE) MG tablet TAKE 1 TABLET BY MOUTH EVERY DAY WITH BREAKFAST 03/25/22   Patwardhan, Manish J, MD  ?hydrALAZINE  (APRESOLINE) 100 MG tablet Take 1 tablet (100 mg total) by mouth 3 (three) times daily. 03/08/22 03/08/23  Patwardhan, Reynold Bowen, MD  ?losartan (COZAAR) 50 MG tablet Take 1 tablet (50 mg total) by mouth daily. 03/08/22   Patwardhan, Reynold Bowen, MD  ?Multiple Vitamin (MULTIVITAMIN WITH MINERALS) TABS tablet Take 1 tablet by mouth daily. 11/18/20   Allie Bossier, MD  ?omeprazole (PRILOSEC) 20 MG capsule Take 1 capsule (20 mg total) by mouth 2 (two) times daily before a meal. 08/08/22   Lianne Cure, DO  ?   ? ?Allergies    ?Ativan [lorazepam]   ? ?Review of Systems   ?Review of Systems  ?Constitutional:  Negative for appetite change and fatigue.  ?HENT:  Negative for congestion, ear discharge and sinus pressure.   ?Eyes:  Negative for discharge.  ?Respiratory:  Negative for cough.   ?Cardiovascular:  Negative for chest pain.  ?Gastrointestinal:  Positive for abdominal pain. Negative for diarrhea.  ?Genitourinary:  Negative for frequency and hematuria.  ?Musculoskeletal:  Negative for back pain.  ?Skin:  Negative for rash.  ?Neurological:  Negative for seizures and headaches.  ?Psychiatric/Behavioral:  Negative for hallucinations.   ? ?Physical Exam ?Updated Vital Signs ?BP 108/78   Pulse 99   Temp 99.1 ?F (37.3 ?C) (Oral)   Resp 17   Ht '5\' 9"'$  (1.753 m)   Wt 95.7 kg   LMP 03/19/2022 (Approximate)   SpO2  96%   BMI 31.16 kg/m?  ?Physical Exam ?Vitals and nursing note reviewed.  ?Constitutional:   ?   Appearance: She is well-developed.  ?HENT:  ?   Head: Normocephalic.  ?   Nose: Nose normal.  ?Eyes:  ?   General: No scleral icterus. ?   Conjunctiva/sclera: Conjunctivae normal.  ?Neck:  ?   Thyroid: No thyromegaly.  ?Cardiovascular:  ?   Rate and Rhythm: Normal rate and regular rhythm.  ?   Heart sounds: No murmur heard. ?  No friction rub. No gallop.  ?Pulmonary:  ?   Breath sounds: No stridor. No wheezing or rales.  ?Chest:  ?   Chest wall: No tenderness.  ?Abdominal:  ?   General: There is no distension.  ?    Tenderness: There is no abdominal tenderness. There is no rebound.  ?Musculoskeletal:     ?   General: Normal range of motion.  ?   Cervical back: Neck supple.  ?Lymphadenopathy:  ?   Cervical: No cervical adenopathy.  ?Skin: ?   Findings: No erythema or rash.  ?Neurological:  ?   Mental Status: She is alert and oriented to person, place, and time.  ?   Motor: No abnormal muscle tone.  ?   Coordination: Coordination normal.  ?Psychiatric:     ?   Behavior: Behavior normal.  ? ? ?ED Results / Procedures / Treatments   ?Labs ?(all labs ordered are listed, but only abnormal results are displayed) ?Labs Reviewed  ?CBC WITH DIFFERENTIAL/PLATELET - Abnormal; Notable for the following components:  ?    Result Value  ? RBC 3.41 (*)   ? Hemoglobin 10.3 (*)   ? HCT 32.0 (*)   ? RDW 17.8 (*)   ? All other components within normal limits  ?COMPREHENSIVE METABOLIC PANEL - Abnormal; Notable for the following components:  ? Potassium 3.4 (*)   ? Chloride 112 (*)   ? CO2 18 (*)   ? All other components within normal limits  ?LIPASE, BLOOD - Abnormal; Notable for the following components:  ? Lipase 166 (*)   ? All other components within normal limits  ?URINALYSIS, COMPLETE (UACMP) WITH MICROSCOPIC - Abnormal; Notable for the following components:  ? Protein, ur 30 (*)   ? Bacteria, UA RARE (*)   ? All other components within normal limits  ? ? ?EKG ?None ? ?Radiology ?No results found. ? ?Procedures ?Procedures  ? ? ?Medications Ordered in ED ?Medications - No data to display ? ?ED Course/ Medical Decision Making/ A&P ?  ?                        ?Medical Decision Making ?Risk ?Prescription drug management. ? ?This patient presents to the ED for concern of abdominal pain vomiting, this involves an extensive number of treatment options, and is a complaint that carries with it a high risk of complications and morbidity.  The differential diagnosis includes gastritis, gastritis, pancreatitis ? ? ?Co morbidities that complicate the  patient evaluation ? ?Pancreatitis ? ? ?Additional history obtained: ? ?Additional history obtained from pt ?External records from outside source obtained and reviewed including hospital records ? ? ?Lab Tests: ? ?I Ordered, and personally interpreted labs.  The pertinent results include: CBC chemistries and lipase.  She has a low hemoglobin 10.3 and lipase 166 ? ? ?Imaging Studies ordered: ? ?No x-rays ordered ?Cardiac Monitoring: / EKG: ? ?The patient was maintained on a cardiac monitor.  I personally viewed and interpreted the cardiac monitored which showed an underlying rhythm of: Normal sinus rhythm ? ? ?Consultations Obtained: ? ?No consult ? ?Problem List / ED Course / Critical interventions / Medication management ? ?Pancreatitis ?I ordered medication including normal saline and Dilaudid and Zofran ?Reevaluation of the patient after these medicines showed that the patient improved ?I have reviewed the patients home medicines and have made adjustments as needed ? ? ?Social Determinants of Health: ? ?Alcohol abuse ? ? ?Test / Admission - Considered: ? ?CT scan of abdomen ? ?Patient with chronic pancreatitis.  She does not want to be admitted to the hospital.  She will be discharged home with nausea and pain medicine follow-up with GI.  Patient had a CT scan recently showing the pancreatitis and the pseudocyst ? ? ? ? ? ? ? ?Final Clinical Impression(s) / ED Diagnoses ?Final diagnoses:  ?Alcohol-induced chronic pancreatitis ([place])  ? ? ?Rx / DC Orders ?ED Discharge Orders   ? ?      Ordered  ?  metoCLOPramide (REGLAN) 10 MG tablet  Every 6 hours       ? 04/08/22 1559  ?  oxyCODONE (ROXICODONE) 5 MG immediate release tablet  Every 6 hours PRN       ? 04/08/22 1559  ? ?  ?  ? ?  ? ? ?  ?Milton Ferguson, MD ?04/09/22 1802 ? ?

## 2022-04-08 NOTE — ED Triage Notes (Signed)
Patient c/o intermittent lower abdominal pain since March. Patient states she was diagnosed with gastritis in March, but states she has a history of pancreatitis. Patient states she is out of the medication that she was prescribed for gastritis and indigestion. ?Patient states that she feels better when she vomits. ?

## 2022-04-16 ENCOUNTER — Ambulatory Visit
Admission: EM | Admit: 2022-04-16 | Discharge: 2022-04-16 | Disposition: A | Discharge status: Home / Self Care | Payer: 59 | Attending: Internal Medicine | Admitting: Internal Medicine

## 2022-04-16 ENCOUNTER — Encounter: Payer: Self-pay | Admitting: Emergency Medicine

## 2022-04-16 DIAGNOSIS — R1084 Generalized abdominal pain: Secondary | ICD-10-CM | POA: Diagnosis not present

## 2022-04-16 DIAGNOSIS — K860 Alcohol-induced chronic pancreatitis: Secondary | ICD-10-CM | POA: Diagnosis not present

## 2022-04-16 MED ORDER — OXYCODONE HCL 5 MG PO TABS: 5.0000 mg | ORAL_TABLET | Freq: Four times a day (QID) | ORAL | 0 refills | Status: AC | PRN

## 2022-04-16 MED ORDER — METOCLOPRAMIDE HCL 10 MG PO TABS: 10.0000 mg | ORAL_TABLET | Freq: Four times a day (QID) | ORAL | 0 refills | Status: DC

## 2022-04-16 NOTE — ED Triage Notes (Addendum)
Pt has hx pancreatitis and was prescribed medications that worked and wanted to know if she can get something refilled to help her make it until her appointment with GI specialist. Pt said medication is working but don't want the pain to progress. With some nausea.  ?

## 2022-04-16 NOTE — ED Provider Notes (Addendum)
?[facility] ? ? ? ?CSN: [id number] ?Arrival date & time: 04/16/22  1743 ? ? ?  ? ?History   ?Chief Complaint ?Chief Complaint  ?Patient presents with  ? Abdominal Pain  ? ? ?HPI ?Robin Guerrero is a 36 y.o. female.  ? ?Patient presents for medication refill for Reglan and oxycodone.  Patient was recently seen at the ED on 04/08/2022 and was diagnosed with alcohol induced chronic pancreatitis after having a chief complaint of abdominal pain, nausea, vomiting.  Patient was advised that it would best to have her admitted to the hospital for further evaluation and treatment but she declined hospital admission.  She states that she has been taking Reglan, omeprazole, and oxycodone that has improved her abdominal pain.  Denies any changes to the abdominal pain or any additional symptoms.  Denies any associated fevers.  Patient has been able to eat and drink fluids.  Denies any associated headache or dizziness.  Patient has associated nausea but denies vomiting.  Denies blood in stool or emesis.  Pain is generalized and is described as a cramping pain.  It is a mild pain per patient.  Patient states that she was referred to GI specialist on hospital discharge but she has not been able to schedule an appointment due to insurance complications.  Due to length of time that has been present since hospital discharge, she has to go through her primary care doctor to have another GI referral completed due to insurance requirements.  Patient is not sure when she is going to be able to see GI specialist but is requesting a refill on Reglan and oxycodone until able to see GI specialist given that they have provided relief of pain and discomfort.  Patient states that she cannot be admitted to the hospital or go back to the hospital given that she is a single mother and does not have childcare for her children. ? ? ?Abdominal Pain ? ?Past Medical History:  ?Diagnosis Date  ? Descending thoracic dissection ([place])   ?  History of anemia   ? no current med.  ? Hypertension   ? states under control with med., has been on med. x 3 yr.  ? Pancreatitis   ? ? ?Patient Active Problem List  ? Diagnosis Date Noted  ? Aortic dissection ([place]) 02/20/2022  ? Acute renal failure superimposed on stage 3a chronic kidney disease ([place]) 06/05/2021  ? 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 ARF (acute renal failure) ([place]) 06/04/2021  ? Pancreatitis 06/04/2021  ? H/O aortic dissection 01/31/2021  ? 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 Malignant hypertension   ? Nausea   ? Back pain   ? Hypocalcemia 08/19/2020  ? Acute alcoholic pancreatitis 29/51/8841  ? Thrombocytopenia ([place]) 08/19/2020  ? Normocytic anemia 08/19/2020  ? Alcoholic pancreatitis 66/05/3015  ? SIRS (systemic inflammatory response syndrome) ([place]) 08/18/2020  ? Hypokalemia 08/18/2020  ? Hypomagnesemia 08/18/2020  ? Prolonged QT interval 08/18/2020  ? Primary hypertension 08/18/2020  ? Depression 08/18/2020  ? Tobacco use 08/18/2020  ? ? ?Past Surgical History:  ?Procedure Laterality Date  ? FOREIGN BODY REMOVAL Left 02/10/2015  ? Procedure: ATTEMPTED EXPLANTATION OF BIRTH CONTROL DEVICE LEFT ARM;  Surgeon: Johnathan Hausen, MD;  Location: [facility];  Service: General;  Laterality: Left;  ? FOREIGN BODY REMOVAL Left 05/12/2015  ? Procedure: REMOVAL OF LEFT ARM BIRTH CONTROL DEVICE;  Surgeon: Johnathan Hausen, MD;  Location: [facility];  Service: General;  Laterality: Left;  ? SUBMANDIBULAR  GLAND EXCISION Right 05/01/2009  ? ? ?OB History   ? ? Gravida  ?4  ? Para  ?2  ? Term  ?2  ? Preterm  ?   ? AB  ?2  ? Living  ?2  ?  ? ? SAB  ?   ? IAB  ?2  ? Ectopic  ?   ? Multiple  ?   ? Live Births  ?2  ?   ?  ?  ? ? ? ?Home Medications   ? ?Prior to Admission medications   ?Medication Sig Start Date End Date Taking? Authorizing Provider  ?oxyCODONE (ROXICODONE) 5 MG immediate release tablet Take 1 tablet (5 mg total) by mouth every 6 (six) hours as needed for up  to 5 days for severe pain. 04/16/22 04/21/22 Yes Teodora Medici, FNP  ?acetaminophen (TYLENOL) 325 MG tablet Take 650 mg by mouth every 6 (six) hours as needed for mild pain, fever or headache.    [provider]  ?amLODipine (NORVASC) 10 MG tablet Take 1 tablet (10 mg total) by mouth daily. 03/08/22   Patwardhan, Reynold Bowen, MD  ?carvedilol (COREG) 25 MG tablet Take 1 tablet (25 mg total) by mouth 2 (two) times daily with a meal. 03/08/22   Patwardhan, Manish J, MD  ?cloNIDine (CATAPRES) 0.2 MG tablet Take 1 tablet (0.2 mg total) by mouth every 8 (eight) hours. 03/08/22 05/07/22  Patwardhan, Reynold Bowen, MD  ?ferrous sulfate 325 (65 FE) MG tablet TAKE 1 TABLET BY MOUTH EVERY DAY WITH BREAKFAST 03/25/22   Patwardhan, Manish J, MD  ?hydrALAZINE (APRESOLINE) 100 MG tablet Take 1 tablet (100 mg total) by mouth 3 (three) times daily. 03/08/22 03/08/23  Patwardhan, Reynold Bowen, MD  ?losartan (COZAAR) 50 MG tablet Take 1 tablet (50 mg total) by mouth daily. 03/08/22   Patwardhan, Reynold Bowen, MD  ?metoCLOPramide (REGLAN) 10 MG tablet Take 1 tablet (10 mg total) by mouth every 6 (six) hours. 04/16/22   Teodora Medici, FNP  ?Multiple Vitamin (MULTIVITAMIN WITH MINERALS) TABS tablet Take 1 tablet by mouth daily. 11/18/20   Allie Bossier, MD  ?omeprazole (PRILOSEC) 20 MG capsule Take 1 capsule (20 mg total) by mouth 2 (two) times daily before a meal. 6/96/29   Lianne Cure, DO  ? ? ?Family History ?Family History  ?Problem Relation Age of Onset  ? Breast cancer Mother 52  ? Heart disease Mother   ? Hypertension Mother   ? ? ?Social History ?Social History  ? ?Tobacco Use  ? Smoking status: Every Day  ?  Packs/day: 0.25  ?  Years: 5.00  ?  Pack years: 1.25  ?  Types: Cigarettes  ? Smokeless tobacco: Never  ? Tobacco comments:  ?  1 pack/week  ?Vaping Use  ? Vaping Use: Never used  ?Substance Use Topics  ? Alcohol use: Yes  ?  Comment: occasionally  ? Drug use: No  ? ? ? ?Allergies   ?Ativan [lorazepam] ? ? ?Review of Systems ?Review of  Systems ?Per HPI ? ?Physical Exam ?Triage Vital Signs ?ED Triage Vitals  ?Enc Vitals Group  ?   BP 04/16/22 1757 101/64  ?   Pulse Rate 04/16/22 1757 (!) 103  ?   Resp --   ?   Temp 04/16/22 1757 98.6 ?F (37 ?C)  ?   Temp Source 04/16/22 1757 Oral  ?   SpO2 04/16/22 1757 98 %  ?   Weight --   ?  Height --   ?   Head Circumference --   ?   Peak Flow --   ?   Pain Score 04/16/22 1752 5  ?   Pain Loc --   ?   Pain Edu? --   ?   Excl. in [place]? --   ? ?No data found. ? ?Updated Vital Signs ?BP 101/64 (BP Location: Left Arm)   Pulse (!) 103   Temp 98.6 ?F (37 ?C) (Oral)   Resp 16   LMP 03/19/2022 (Approximate)   SpO2 98%  ? ?Visual Acuity ?Right Eye Distance:   ?Left Eye Distance:   ?Bilateral Distance:   ? ?Right Eye Near:   ?Left Eye Near:    ?Bilateral Near:    ? ?Physical Exam ?Constitutional:   ?   General: She is not in acute distress. ?   Appearance: Normal appearance. She is not toxic-appearing or diaphoretic.  ?HENT:  ?   Head: Normocephalic and atraumatic.  ?Eyes:  ?   Extraocular Movements: Extraocular movements intact.  ?   Conjunctiva/sclera: Conjunctivae normal.  ?Cardiovascular:  ?   Rate and Rhythm: Normal rate and regular rhythm.  ?   Pulses: Normal pulses.  ?   Heart sounds: Normal heart sounds.  ?Pulmonary:  ?   Effort: Pulmonary effort is normal. No respiratory distress.  ?   Breath sounds: Normal breath sounds.  ?Abdominal:  ?   General: Bowel sounds are normal. There is no distension.  ?   Palpations: Abdomen is soft.  ?   Tenderness: There is no abdominal tenderness.  ?Neurological:  ?   General: No focal deficit present.  ?   Mental Status: She is alert and oriented to person, place, and time. Mental status is at baseline.  ?Psychiatric:     ?   Mood and Affect: Mood normal.     ?   Behavior: Behavior normal.     ?   Thought Content: Thought content normal.     ?   Judgment: Judgment normal.  ? ? ? ?UC Treatments / Results  ?Labs ?(all labs ordered are listed, but only abnormal results are  displayed) ?Labs Reviewed - No data to display ? ?EKG ? ? ?Radiology ?No results found. ? ?Procedures ?Procedures (including critical care time) ? ?Medications Ordered in UC ?Medications - No data to display ? ?Initial

## 2022-04-16 NOTE — Discharge Instructions (Signed)
Your medications have been refilled.  Please go to the hospital if symptoms persist or worsen.  Follow-up with GI specialist soon as possible. ?

## 2022-04-22 ENCOUNTER — Other Ambulatory Visit: Payer: Self-pay

## 2022-04-22 ENCOUNTER — Emergency Department (HOSPITAL_COMMUNITY): Payer: 59

## 2022-04-22 ENCOUNTER — Observation Stay (HOSPITAL_COMMUNITY)
Admission: EM | Admit: 2022-04-22 | Discharge: 2022-04-24 | Disposition: A | Discharge status: Home / Self Care | Payer: 59 | Attending: Family Medicine | Admitting: Family Medicine

## 2022-04-22 ENCOUNTER — Encounter (HOSPITAL_COMMUNITY): Payer: Self-pay | Admitting: Emergency Medicine

## 2022-04-22 DIAGNOSIS — F17210 Nicotine dependence, cigarettes, uncomplicated: Secondary | ICD-10-CM | POA: Diagnosis not present

## 2022-04-22 DIAGNOSIS — D649 Anemia, unspecified: Secondary | ICD-10-CM | POA: Diagnosis present

## 2022-04-22 DIAGNOSIS — K859 Acute pancreatitis without necrosis or infection, unspecified: Principal | ICD-10-CM | POA: Diagnosis present

## 2022-04-22 DIAGNOSIS — K852 Alcohol induced acute pancreatitis without necrosis or infection: Secondary | ICD-10-CM | POA: Diagnosis present

## 2022-04-22 DIAGNOSIS — Z79899 Other long term (current) drug therapy: Secondary | ICD-10-CM | POA: Diagnosis not present

## 2022-04-22 DIAGNOSIS — K863 Pseudocyst of pancreas: Secondary | ICD-10-CM

## 2022-04-22 DIAGNOSIS — I10 Essential (primary) hypertension: Secondary | ICD-10-CM | POA: Diagnosis present

## 2022-04-22 DIAGNOSIS — R109 Unspecified abdominal pain: Secondary | ICD-10-CM | POA: Diagnosis present

## 2022-04-22 LAB — CBC
HCT: 31.9 % — ABNORMAL LOW (ref 36.0–46.0)
Hemoglobin: 10.5 g/dL — ABNORMAL LOW (ref 12.0–15.0)
MCH: 30.9 pg (ref 26.0–34.0)
MCHC: 32.9 g/dL (ref 30.0–36.0)
MCV: 93.8 fL (ref 80.0–100.0)
Platelets: 291 10*3/uL (ref 150–400)
RBC: 3.4 MIL/uL — ABNORMAL LOW (ref 3.87–5.11)
RDW: 18 % — ABNORMAL HIGH (ref 11.5–15.5)
WBC: 9.7 10*3/uL (ref 4.0–10.5)
nRBC: 0 % (ref 0.0–0.2)

## 2022-04-22 LAB — URINALYSIS, ROUTINE W REFLEX MICROSCOPIC
Bilirubin Urine: NEGATIVE
Glucose, UA: NEGATIVE mg/dL
Hgb urine dipstick: NEGATIVE
Ketones, ur: 5 mg/dL — AB
Nitrite: NEGATIVE
Protein, ur: 100 mg/dL — AB
Specific Gravity, Urine: 1.021 (ref 1.005–1.030)
pH: 5 (ref 5.0–8.0)

## 2022-04-22 LAB — COMPREHENSIVE METABOLIC PANEL
ALT: 14 U/L (ref 0–44)
AST: 14 U/L — ABNORMAL LOW (ref 15–41)
Albumin: 3.6 g/dL (ref 3.5–5.0)
Alkaline Phosphatase: 60 U/L (ref 38–126)
Anion gap: 6 (ref 5–15)
BUN: 8 mg/dL (ref 6–20)
CO2: 19 mmol/L — ABNORMAL LOW (ref 22–32)
Calcium: 9 mg/dL (ref 8.9–10.3)
Chloride: 115 mmol/L — ABNORMAL HIGH (ref 98–111)
Creatinine, Ser: 0.75 mg/dL (ref 0.44–1.00)
GFR, Estimated: >60 mL/min (ref >60)
Glucose, Bld: 125 mg/dL — ABNORMAL HIGH (ref 70–99)
Potassium: 3.5 mmol/L (ref 3.5–5.1)
Sodium: 140 mmol/L (ref 135–145)
Total Bilirubin: 1.4 mg/dL — ABNORMAL HIGH (ref 0.3–1.2)
Total Protein: 7.5 g/dL (ref 6.5–8.1)

## 2022-04-22 LAB — PREGNANCY, URINE: Preg Test, Ur: NEGATIVE

## 2022-04-22 LAB — I-STAT BETA HCG BLOOD, ED (MC, WL, AP ONLY): I-stat hCG, quantitative: 9.2 m[IU]/mL — ABNORMAL HIGH (ref <5)

## 2022-04-22 LAB — LIPASE, BLOOD: Lipase: 112 U/L — ABNORMAL HIGH (ref 11–51)

## 2022-04-22 MED ORDER — LIDOCAINE VISCOUS HCL 2 % MT SOLN
15.0000 mL | Freq: Once | OROMUCOSAL | Status: DC
Start: 2022-04-22 — End: 2022-04-23
  Filled 2022-04-22: qty 15

## 2022-04-22 MED ORDER — ACETAMINOPHEN 325 MG PO TABS: 650.0000 mg | ORAL_TABLET | Freq: Four times a day (QID) | ORAL | Status: DC | PRN

## 2022-04-22 MED ORDER — PANTOPRAZOLE SODIUM 40 MG PO TBEC
40.0000 mg | DELAYED_RELEASE_TABLET | Freq: Every day | ORAL | Status: DC
  Administered 2022-04-22 – 2022-04-24 (×3): 40 mg via ORAL
  Filled 2022-04-22 (×3): qty 1

## 2022-04-22 MED ORDER — OXYCODONE HCL 5 MG PO TABS: 5.0000 mg | ORAL_TABLET | Freq: Four times a day (QID) | ORAL | Status: DC | PRN

## 2022-04-22 MED ORDER — PROCHLORPERAZINE EDISYLATE 10 MG/2ML IJ SOLN: 10.0000 mg | Freq: Four times a day (QID) | INTRAMUSCULAR | Status: DC | PRN

## 2022-04-22 MED ORDER — ALUM & MAG HYDROXIDE-SIMETH 200-200-20 MG/5ML PO SUSP
30.0000 mL | Freq: Once | ORAL | Status: AC
  Administered 2022-04-22: 30 mL via ORAL
  Filled 2022-04-22: qty 30

## 2022-04-22 MED ORDER — ADULT MULTIVITAMIN W/MINERALS CH
1.0000 | ORAL_TABLET | Freq: Every day | ORAL | Status: DC
  Administered 2022-04-22 – 2022-04-23 (×2): 1 via ORAL
  Filled 2022-04-22 (×2): qty 1

## 2022-04-22 MED ORDER — IOHEXOL 300 MG/ML  SOLN
100.0000 mL | Freq: Once | INTRAMUSCULAR | Status: AC | PRN
  Administered 2022-04-22: 100 mL via INTRAVENOUS

## 2022-04-22 MED ORDER — HYDROMORPHONE HCL 1 MG/ML IJ SOLN
1.0000 mg | Freq: Once | INTRAMUSCULAR | Status: AC
  Administered 2022-04-22: 1 mg via INTRAVENOUS
  Filled 2022-04-22: qty 1

## 2022-04-22 MED ORDER — POLYETHYLENE GLYCOL 3350 17 G PO PACK
17.0000 g | PACK | Freq: Every day | ORAL | Status: DC | PRN
Start: 2022-04-22 — End: 2022-04-24
  Administered 2022-04-24: 17 g via ORAL
  Filled 2022-04-22: qty 1

## 2022-04-22 MED ORDER — ENOXAPARIN SODIUM 40 MG/0.4ML IJ SOSY
40.0000 mg | PREFILLED_SYRINGE | INTRAMUSCULAR | Status: DC
  Administered 2022-04-22 – 2022-04-23 (×2): 40 mg via SUBCUTANEOUS
  Filled 2022-04-22 (×2): qty 0.4

## 2022-04-22 MED ORDER — CARVEDILOL 25 MG PO TABS
25.0000 mg | ORAL_TABLET | Freq: Two times a day (BID) | ORAL | Status: DC
  Administered 2022-04-23 – 2022-04-24 (×3): 25 mg via ORAL
  Filled 2022-04-22 (×3): qty 1

## 2022-04-22 MED ORDER — CLONIDINE HCL 0.1 MG PO TABS
0.2000 mg | ORAL_TABLET | Freq: Three times a day (TID) | ORAL | Status: DC
Start: 2022-04-22 — End: 2022-04-24
  Administered 2022-04-22 – 2022-04-24 (×4): 0.2 mg via ORAL
  Filled 2022-04-22 (×4): qty 2

## 2022-04-22 MED ORDER — NICOTINE 14 MG/24HR TD PT24
14.0000 mg | MEDICATED_PATCH | Freq: Every day | TRANSDERMAL | Status: DC
  Administered 2022-04-22 – 2022-04-24 (×3): 14 mg via TRANSDERMAL
  Filled 2022-04-22 (×3): qty 1

## 2022-04-22 MED ORDER — AMLODIPINE BESYLATE 10 MG PO TABS
10.0000 mg | ORAL_TABLET | Freq: Every day | ORAL | Status: DC
  Administered 2022-04-23 – 2022-04-24 (×2): 10 mg via ORAL
  Filled 2022-04-22 (×2): qty 1

## 2022-04-22 MED ORDER — METOCLOPRAMIDE HCL 5 MG PO TABS
10.0000 mg | ORAL_TABLET | Freq: Four times a day (QID) | ORAL | Status: DC
  Administered 2022-04-22: 10 mg via ORAL

## 2022-04-22 MED ORDER — SODIUM CHLORIDE 0.9 % IV BOLUS
1000.0000 mL | Freq: Once | INTRAVENOUS | Status: AC
  Administered 2022-04-22: 1000 mL via INTRAVENOUS

## 2022-04-22 MED ORDER — HYDROMORPHONE HCL 1 MG/ML IJ SOLN
1.0000 mg | INTRAMUSCULAR | Status: DC | PRN
  Administered 2022-04-23 (×3): 1 mg via INTRAVENOUS
  Filled 2022-04-22 (×3): qty 1

## 2022-04-22 MED ORDER — LOSARTAN POTASSIUM 50 MG PO TABS
100.0000 mg | ORAL_TABLET | Freq: Every day | ORAL | Status: DC
  Administered 2022-04-23 – 2022-04-24 (×2): 100 mg via ORAL
  Filled 2022-04-22 (×2): qty 2

## 2022-04-22 MED ORDER — HYDRALAZINE HCL 25 MG PO TABS
100.0000 mg | ORAL_TABLET | Freq: Three times a day (TID) | ORAL | Status: DC
  Administered 2022-04-22 – 2022-04-24 (×5): 100 mg via ORAL
  Filled 2022-04-22 (×5): qty 4

## 2022-04-22 MED ORDER — FERROUS SULFATE 325 (65 FE) MG PO TABS
325.0000 mg | ORAL_TABLET | Freq: Every day | ORAL | Status: DC
  Administered 2022-04-22 – 2022-04-23 (×2): 325 mg via ORAL
  Filled 2022-04-22: qty 1

## 2022-04-22 MED ORDER — LACTATED RINGERS IV SOLN: INTRAVENOUS | Status: DC

## 2022-04-22 MED ORDER — ONDANSETRON HCL 4 MG/2ML IJ SOLN
4.0000 mg | Freq: Once | INTRAMUSCULAR | Status: AC
  Administered 2022-04-22: 4 mg via INTRAVENOUS
  Filled 2022-04-22: qty 2

## 2022-04-22 NOTE — ED Provider Notes (Signed)
?[facility] DEPT ?Provider Note ? ? ?CSN: [id number] ?Arrival date & time: 04/22/22  1409 ? ?  ? ?History ? ?Chief Complaint  ?Patient presents with  ? Abdominal Pain  ? ? ?Robin Guerrero is a 36 y.o. female history of alcohol pancreatitis here presenting with nausea vomiting and abdominal pain.  Patient states that she has been having abdominal pain for the last several weeks.  Patient states that she was seen in the ER about a week ago and was supposed to get admitted but patient states that she had to go home and had to take care of her kids and also her work situation.  Patient went back to urgent care several days ago regarding her pain and was told that she needs urgent GI referral for her pancreatitis and pseudocyst.  Patient states that she finally got permission from her job to take some time off.  She states that for the last 2 to 3 days, she has been out of her pain medicine and she has been in severe pain and has been vomiting as well.  Denies any fevers. ? ?The history is provided by the patient.  ? ?  ? ?Home Medications ?Prior to Admission medications   ?Medication Sig Start Date End Date Taking? Authorizing Provider  ?acetaminophen (TYLENOL) 325 MG tablet Take 650 mg by mouth every 6 (six) hours as needed for mild pain, fever or headache.    [provider]  ?amLODipine (NORVASC) 10 MG tablet Take 1 tablet (10 mg total) by mouth daily. 03/08/22   Patwardhan, Reynold Bowen, MD  ?carvedilol (COREG) 25 MG tablet Take 1 tablet (25 mg total) by mouth 2 (two) times daily with a meal. 03/08/22   Patwardhan, Manish J, MD  ?cloNIDine (CATAPRES) 0.2 MG tablet Take 1 tablet (0.2 mg total) by mouth every 8 (eight) hours. 03/08/22 05/07/22  Patwardhan, Reynold Bowen, MD  ?ferrous sulfate 325 (65 FE) MG tablet TAKE 1 TABLET BY MOUTH EVERY DAY WITH BREAKFAST 03/25/22   Patwardhan, Manish J, MD  ?hydrALAZINE (APRESOLINE) 100 MG tablet Take 1 tablet (100 mg total) by mouth 3 (three) times  daily. 03/08/22 03/08/23  Patwardhan, Reynold Bowen, MD  ?losartan (COZAAR) 50 MG tablet Take 1 tablet (50 mg total) by mouth daily. 03/08/22   Patwardhan, Reynold Bowen, MD  ?metoCLOPramide (REGLAN) 10 MG tablet Take 1 tablet (10 mg total) by mouth every 6 (six) hours. 04/16/22   Teodora Medici, FNP  ?Multiple Vitamin (MULTIVITAMIN WITH MINERALS) TABS tablet Take 1 tablet by mouth daily. 11/18/20   Allie Bossier, MD  ?omeprazole (PRILOSEC) 20 MG capsule Take 1 capsule (20 mg total) by mouth 2 (two) times daily before a meal. 0/99/83   Lianne Cure, DO  ?   ? ?Allergies    ?Ativan [lorazepam]   ? ?Review of Systems   ?Review of Systems  ?Gastrointestinal:  Positive for abdominal pain.  ?All other systems reviewed and are negative. ? ?Physical Exam ?Updated Vital Signs ?BP 112/85   Pulse (!) 110   Temp 99.2 ?F (37.3 ?C) (Oral)   Resp 16   SpO2 100%  ?Physical Exam ?Vitals and nursing note reviewed.  ?Constitutional:   ?   Comments: Uncomfortable   ?HENT:  ?   Head: Normocephalic.  ?   Mouth/Throat:  ?   Pharynx: Oropharynx is clear.  ?Eyes:  ?   Extraocular Movements: Extraocular movements intact.  ?   Pupils: Pupils are equal, round, and reactive to  light.  ?Cardiovascular:  ?   Rate and Rhythm: Normal rate and regular rhythm.  ?Pulmonary:  ?   Effort: Pulmonary effort is normal.  ?   Breath sounds: Normal breath sounds.  ?Abdominal:  ?   Comments: Distended, + epigastric tenderness   ?Skin: ?   General: Skin is warm.  ?   Capillary Refill: Capillary refill takes less than 2 seconds.  ?Neurological:  ?   General: No focal deficit present.  ?   Mental Status: She is oriented to person, place, and time.  ? ? ?ED Results / Procedures / Treatments   ?Labs ?(all labs ordered are listed, but only abnormal results are displayed) ?Labs Reviewed  ?LIPASE, BLOOD - Abnormal; Notable for the following components:  ?    Result Value  ? Lipase 112 (*)   ? All other components within normal limits  ?COMPREHENSIVE METABOLIC PANEL -  Abnormal; Notable for the following components:  ? Chloride 115 (*)   ? CO2 19 (*)   ? Glucose, Bld 125 (*)   ? AST 14 (*)   ? Total Bilirubin 1.4 (*)   ? All other components within normal limits  ?CBC - Abnormal; Notable for the following components:  ? RBC 3.40 (*)   ? Hemoglobin 10.5 (*)   ? HCT 31.9 (*)   ? RDW 18.0 (*)   ? All other components within normal limits  ?URINALYSIS, ROUTINE W REFLEX MICROSCOPIC - Abnormal; Notable for the following components:  ? Color, Urine AMBER (*)   ? APPearance HAZY (*)   ? Ketones, ur 5 (*)   ? Protein, ur 100 (*)   ? Leukocytes,Ua TRACE (*)   ? Bacteria, UA RARE (*)   ? All other components within normal limits  ?I-STAT BETA HCG BLOOD, ED ([facility], [facility], [facility] ONLY) - Abnormal; Notable for the following components:  ? I-stat hCG, quantitative 9.2 (*)   ? All other components within normal limits  ?PREGNANCY, URINE  ? ? ?EKG ?EKG Interpretation ? ?Date/Time:  Monday Apr 22 2022 14:24:50 EDT ?Ventricular Rate:  110 ?PR Interval:  154 ?QRS Duration: 86 ?QT Interval:  357 ?QTC Calculation: 483 ?R Axis:   7 ?Text Interpretation: Sinus tachycardia Probable left atrial enlargement Low voltage, precordial leads Consider anterior infarct No significant change since last tracing Confirmed by Wandra Arthurs [phone]) on 04/22/2022 5:57:55 PM ? ?Radiology ?CT Abdomen Pelvis W Contrast ? ?Result Date: 04/22/2022 ?CLINICAL DATA:  Abdominal pain, nausea, vomiting and pancreatitis. EXAM: CT ABDOMEN AND PELVIS WITH CONTRAST TECHNIQUE: Multidetector CT imaging of the abdomen and pelvis was performed using the standard protocol following bolus administration of intravenous contrast. RADIATION DOSE REDUCTION: This exam was performed according to the departmental dose-optimization program which includes automated exposure control, adjustment of the mA and/or kV according to patient size and/or use of iterative reconstruction technique. CONTRAST:  161m OMNIPAQUE IOHEXOL 300 MG/ML  SOLN COMPARISON:  CTA of the  abdomen and pelvis on 03/29/2022 FINDINGS: Lower chest: No acute abnormality. Hepatobiliary: No focal liver abnormality is seen. No gallstones, gallbladder wall thickening, or biliary dilatation. Pancreas: Peripancreatic inflammation is similar to the recent CTA. No evidence pancreatic necrosis or ductal dilatation. Stable 12 mm small fluid collection in the posterior head of the pancreas near the uncinate process. Spleen: Normal in size without focal abnormality. Adrenals/Urinary Tract: Adrenal glands are unremarkable. Kidneys are normal, without renal calculi, focal lesion, or hydronephrosis. Bladder is unremarkable. Stomach/Bowel: Bowel shows no evidence of obstruction, ileus, inflammation  or lesion. The appendix is normal. No free intraperitoneal air. 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 The vein appeared open in 2021 by CT but since that time a true venous phase study has not been performed. The portal vein and mesenteric veins appear normally patent. No lymphadenopathy identified. Reproductive: Stable mildly enlarged and lobulated uterus likely related to underlying fibroids. No adnexal masses. Other: Small amount of free fluid in the posterior pelvis. No hernias. Musculoskeletal: No acute or significant osseous findings. IMPRESSION: 1. Stable appearance of diffuse pancreatitis without necrosis or abscess formation. Stable small fluid collection in the posterior head of the pancreas measuring 12 mm likely represents a small pseudocyst. 2. There may be associated splenic vein thrombosis at the level of the splenic hilum with lack of a well delineated patent splenic vein on the current exam. 3. 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 Electronically Signed   By: Aletta Edouard M.D.   On: 04/22/2022 18:35   ? ?Procedures ?Procedures  ? ? ?Medications Ordered in ED ?Medications  ?sodium chloride 0.9 % bolus 1,000 mL (1,000 mLs Intravenous

## 2022-04-22 NOTE — ED Triage Notes (Signed)
Pt reports abdominal pain. Pt report N/V. Pt hx of pancreatitis  ?

## 2022-04-22 NOTE — H&P (Signed)
?History and Physical ? ?Robin Guerrero TKP:[id number] DOB: December 24, 1985 DOA: 04/22/2022 ? ?Referring physician: Dr. Darl Householder, [place]. ?PCP: Trey Sailors, PA  ?Outpatient Specialists: Vascular surgery, cardiology, gynecology. ?Patient coming from: Home. ? ?Chief Complaint: Epigastric abdominal pain, nausea and vomiting. ? ?HPI: Robin Guerrero is a 36 y.o. 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  Associated with nausea and vomiting, for the past several weeks.  Seen in the ER 04/08/22, declined admission so she could go home and take care of her children.  Due to recurrent symptoms and running out of her pain/nausea medications, she went to urgent care on 04/16/2022 for the same, and to have refills of her medications Reglan and oxycodone.  Presents today with the same complaints.   ? ?In the ED, lipase 112, CT abdomen pelvis with contrast revealed stable appearance of diffuse pancreatitis without necrosis or abscess formation.  Stable small fluid collection in the posterior head of the pancreas measuring 12 mm likely represents a small pseudocyst.  There may be associated splenic vein thrombosis at the level of the splenic helium with lack of a well delineated  patent splenic vein on the current exam.  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 ? ?The patient received IV fluid hydration 1L NS bolus x1, IV antiemetics Zofran '4mg'$  x1 and IV analgesics Dilaudid '1mg'$  x1 in the ED.  TRH, hospitalist service, was asked to admit for further management. ? ?ED Course: Tmax 98.5.  BP 127/87, pulse 87, respiration rate 20, O2 saturation 100% on room air.  Lab  studies remarkable for serum bicarb of 19, glucose 125, lipase 112, total bilirubin 1.4.  Hemoglobin 10.5, at baseline.  MCV 93. ? ?Review of Systems: ?Review of systems as noted in the HPI. All other systems reviewed and are negative. ? ? ?Past Medical History:  ?Diagnosis Date  ? 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 History of anemia   ? no current med.  ? Hypertension   ? states under control with med., has been on med. x 3 yr.  ? Pancreatitis   ? ?Past Surgical History:  ?Procedure Laterality Date  ? FOREIGN BODY REMOVAL Left 02/10/2015  ? Procedure: ATTEMPTED EXPLANTATION OF BIRTH CONTROL DEVICE LEFT ARM;  Surgeon: Johnathan Hausen, MD;  Location: [facility];  Service: General;  Laterality: Left;  ? FOREIGN BODY REMOVAL Left 05/12/2015  ? Procedure: REMOVAL OF LEFT ARM BIRTH CONTROL DEVICE;  Surgeon: Johnathan Hausen, MD;  Location: [facility];  Service: General;  Laterality: Left;  ? SUBMANDIBULAR GLAND EXCISION Right 05/01/2009  ? ? ?Social History:  reports that she has been smoking cigarettes. She has a 1.25 pack-year smoking history. She has never used smokeless tobacco. She reports current alcohol use. She reports that she does not use drugs. ? ? ?Allergies  ?Allergen Reactions  ? Ativan [Lorazepam] Other (See Comments)  ?  Disorientated, combative  ? ? ?Family History  ?Problem Relation Age of Onset  ? Breast cancer Mother 62  ? Heart disease Mother   ? Hypertension Mother   ?  ? ? ?Prior to Admission medications   ?Medication Sig Start Date End Date Taking? Authorizing Provider  ?amLODipine (NORVASC) 10  MG tablet Take 1 tablet (10 mg total) by mouth daily. 03/08/22  Yes Patwardhan, Reynold Bowen, MD  ?carvedilol (COREG) 25 MG tablet Take 1 tablet (25 mg total) by mouth 2 (two) times daily with a meal. 03/08/22  Yes Patwardhan, Manish J, MD  ?cloNIDine (CATAPRES) 0.2 MG tablet Take 1 tablet (0.2 mg total) by mouth every 8 (eight) hours. 03/08/22 05/07/22 Yes Patwardhan, Manish J, MD   ?ferrous sulfate 325 (65 FE) MG tablet TAKE 1 TABLET BY MOUTH EVERY DAY WITH BREAKFAST ?Patient taking differently: Take 325 mg by mouth daily. 03/25/22  Yes Patwardhan, Manish J, MD  ?hydrALAZINE (APRESOLINE) 100 MG tablet Take 1 tablet (100 mg total) by mouth 3 (three) times daily. 03/08/22 03/08/23 Yes Patwardhan, Manish J, MD  ?ibuprofen (ADVIL) 200 MG tablet Take 200 mg by mouth every 6 (six) hours as needed for mild pain.   Yes [provider]  ?losartan (COZAAR) 100 MG tablet Take 100 mg by mouth daily. 04/01/22  Yes [provider]  ?metoCLOPramide (REGLAN) 10 MG tablet Take 1 tablet (10 mg total) by mouth every 6 (six) hours. ?Patient taking differently: Take 10 mg by mouth in the morning and at bedtime. 04/16/22  Yes Teodora Medici, FNP  ?Multiple Vitamin (MULTIVITAMIN WITH MINERALS) TABS tablet Take 1 tablet by mouth daily. 11/18/20  Yes Allie Bossier, MD  ?omeprazole (PRILOSEC) 20 MG capsule Take 1 capsule (20 mg total) by mouth 2 (two) times daily before a meal. 6/80/32  Yes Campbell Stall P, DO  ?losartan (COZAAR) 50 MG tablet Take 1 tablet (50 mg total) by mouth daily. ?Patient not taking: Reported on 04/22/2022 03/08/22   Nigel Mormon, MD  ? ? ?Physical Exam: ?BP 104/72   Pulse 95   Temp 99.2 ?F (37.3 ?C) (Oral)   Resp 14   Ht '5\' 9"'$  (1.753 m)   Wt 95 kg   SpO2 99%   BMI 30.93 kg/m?  ? ?General: 36 y.o. year-old female well developed well nourished in no acute distress.  Alert and oriented x3. ?Cardiovascular: Regular rate and rhythm with no rubs or gallops.  No thyromegaly or JVD noted.  No lower extremity edema. 2/4 pulses in all 4 extremities. ?Respiratory: Clear to auscultation with no wheezes or rales. Good inspiratory effort. ?Abdomen: Soft tender at epigastric region radiating to the side.  Nondistended with normal bowel sounds x4 quadrants. ?Muskuloskeletal: No cyanosis, clubbing or edema noted bilaterally ?Neuro: CN II-XII intact, strength, sensation, reflexes ?Skin:  No ulcerative lesions noted or rashes ?Psychiatry: Judgement and insight appear normal. Mood is appropriate for condition and setting ?   ?   ?   ?Labs on Admission:  ?Basic Metabolic Panel: ?Recent Labs  ?Lab 04/22/22 ?1423  ?NA 140  ?K 3.5  ?CL 115*  ?CO2 19*  ?GLUCOSE 125*  ?BUN 8  ?CREATININE 0.75  ?CALCIUM 9.0  ? ?Liver Function Tests: ?Recent Labs  ?Lab 04/22/22 ?1423  ?AST 14*  ?ALT 14  ?ALKPHOS 60  ?BILITOT 1.4*  ?PROT 7.5  ?ALBUMIN 3.6  ? ?Recent Labs  ?Lab 04/22/22 ?1423  ?LIPASE 112*  ? ?No results for input(s): AMMONIA in the last 168 hours. ?CBC: ?Recent Labs  ?Lab 04/22/22 ?1423  ?WBC 9.7  ?HGB 10.5*  ?HCT 31.9*  ?MCV 93.8  ?PLT 291  ? ?Cardiac Enzymes: ?No results for input(s): CKTOTAL, CKMB, CKMBINDEX, TROPONINI in the last 168 hours. ? ?BNP (last 3 results) ?No results for input(s): BNP in the last 8760 hours. ? ?  ProBNP (last 3 results) ?No results for input(s): PROBNP in the last 8760 hours. ? ?CBG: ?No results for input(s): GLUCAP in the last 168 hours. ? ?Radiological Exams on Admission: ?CT Abdomen Pelvis W Contrast ? ?Result Date: 04/22/2022 ?CLINICAL DATA:  Abdominal pain, nausea, vomiting and pancreatitis. EXAM: CT ABDOMEN AND PELVIS WITH CONTRAST TECHNIQUE: Multidetector CT imaging of the abdomen and pelvis was performed using the standard protocol following bolus administration of intravenous contrast. RADIATION DOSE REDUCTION: This exam was performed according to the departmental dose-optimization program which includes automated exposure control, adjustment of the mA and/or kV according to patient size and/or use of iterative reconstruction technique. CONTRAST:  1101m OMNIPAQUE IOHEXOL 300 MG/ML  SOLN COMPARISON:  CTA of the abdomen and pelvis on 03/29/2022 FINDINGS: Lower chest: No acute abnormality. Hepatobiliary: No focal liver abnormality is seen. No gallstones, gallbladder wall thickening, or biliary dilatation. Pancreas: Peripancreatic inflammation is similar to the recent CTA. No  evidence pancreatic necrosis or ductal dilatation. Stable 12 mm small fluid collection in the posterior head of the pancreas near the uncinate process. Spleen: Normal in size without focal abnormality. Adrenals/Urinar

## 2022-04-22 NOTE — ED Provider Triage Note (Signed)
Emergency Medicine Provider Triage Evaluation Note ? ?Robin Guerrero , a 36 y.o. female  was evaluated in triage.  Pt complains of increasing abdominal pain with N/V.  Radiation through to the back.  Also with intermittent constipation and dark-colored stools over the last few months.  Has tried laxatives without success.  Endorses iron supplementation.  Hx of pancreatitis, was supposed to be admitted 1 to 2 months ago, but due to family reasons she deferred.  States she now feels more dehydrated, was told to come back to the ED if she felt this way.  Denies diarrhea, urinary symptoms, vaginal discharge, possibility of pregnancy, shortness of breath, chest pain.  LMP 1 month ago. ? ?Review of Systems  ?Positive: As above ?Negative: As above ? ?Physical Exam  ?BP 112/85   Pulse (!) 107   Temp 99.2 ?F (37.3 ?C) (Oral)   Resp 19   SpO2 100%  ?Gen:   Awake, no distress   ?Resp:  Normal effort, CTAB ?MSK:   Moves extremities without difficulty  ?Other:  Tachycardic.  Diffuse abdominal pain.  Centralized back pain. ? ?Medical Decision Making  ?Medically screening exam initiated at 3:01 PM.  Appropriate orders placed.  Analese Oppedisano was informed that the remainder of the evaluation will be completed by another provider, this initial triage assessment does not replace that evaluation, and the importance of remaining in the ED until their evaluation is complete. ? ?Labs, imaging, EKG ordered. ? ?Offered Zofran, patient deferred for now. ?  ?Prince Rome, PA-C ?09/38/18 1514 ? ?

## 2022-04-23 DIAGNOSIS — I10 Essential (primary) hypertension: Secondary | ICD-10-CM | POA: Diagnosis not present

## 2022-04-23 DIAGNOSIS — D649 Anemia, unspecified: Secondary | ICD-10-CM

## 2022-04-23 DIAGNOSIS — K863 Pseudocyst of pancreas: Secondary | ICD-10-CM

## 2022-04-23 DIAGNOSIS — K859 Acute pancreatitis without necrosis or infection, unspecified: Secondary | ICD-10-CM | POA: Diagnosis not present

## 2022-04-23 LAB — COMPREHENSIVE METABOLIC PANEL
ALT: 11 U/L (ref 0–44)
AST: 11 U/L — ABNORMAL LOW (ref 15–41)
Albumin: 3 g/dL — ABNORMAL LOW (ref 3.5–5.0)
Alkaline Phosphatase: 47 U/L (ref 38–126)
Anion gap: 5 (ref 5–15)
BUN: 8 mg/dL (ref 6–20)
CO2: 20 mmol/L — ABNORMAL LOW (ref 22–32)
Calcium: 8.6 mg/dL — ABNORMAL LOW (ref 8.9–10.3)
Chloride: 114 mmol/L — ABNORMAL HIGH (ref 98–111)
Creatinine, Ser: 0.63 mg/dL (ref 0.44–1.00)
GFR, Estimated: >60 mL/min (ref >60)
Glucose, Bld: 108 mg/dL — ABNORMAL HIGH (ref 70–99)
Potassium: 3.2 mmol/L — ABNORMAL LOW (ref 3.5–5.1)
Sodium: 139 mmol/L (ref 135–145)
Total Bilirubin: 0.7 mg/dL (ref 0.3–1.2)
Total Protein: 6.3 g/dL — ABNORMAL LOW (ref 6.5–8.1)

## 2022-04-23 LAB — CBC WITH DIFFERENTIAL/PLATELET
Abs Immature Granulocytes: 0.02 10*3/uL (ref 0.00–0.07)
Basophils Absolute: 0 10*3/uL (ref 0.0–0.1)
Basophils Relative: 0 %
Eosinophils Absolute: 0.1 10*3/uL (ref 0.0–0.5)
Eosinophils Relative: 2 %
HCT: 27 % — ABNORMAL LOW (ref 36.0–46.0)
Hemoglobin: 8.6 g/dL — ABNORMAL LOW (ref 12.0–15.0)
Immature Granulocytes: 0 %
Lymphocytes Relative: 23 %
Lymphs Abs: 1.2 10*3/uL (ref 0.7–4.0)
MCH: 30.7 pg (ref 26.0–34.0)
MCHC: 31.9 g/dL (ref 30.0–36.0)
MCV: 96.4 fL (ref 80.0–100.0)
Monocytes Absolute: 0.5 10*3/uL (ref 0.1–1.0)
Monocytes Relative: 9 %
Neutro Abs: 3.5 10*3/uL (ref 1.7–7.7)
Neutrophils Relative %: 66 %
Platelets: 202 10*3/uL (ref 150–400)
RBC: 2.8 MIL/uL — ABNORMAL LOW (ref 3.87–5.11)
RDW: 18.1 % — ABNORMAL HIGH (ref 11.5–15.5)
WBC: 5.3 10*3/uL (ref 4.0–10.5)
nRBC: 0 % (ref 0.0–0.2)

## 2022-04-23 LAB — LIPASE, BLOOD: Lipase: 69 U/L — ABNORMAL HIGH (ref 11–51)

## 2022-04-23 LAB — MAGNESIUM: Magnesium: 2 mg/dL (ref 1.7–2.4)

## 2022-04-23 LAB — PHOSPHORUS: Phosphorus: 3.4 mg/dL (ref 2.5–4.6)

## 2022-04-23 MED ORDER — SODIUM CHLORIDE 0.9 % IV SOLN
INTRAVENOUS | Status: AC
Start: 2022-04-23 — End: 2022-04-24

## 2022-04-23 MED ORDER — POTASSIUM CHLORIDE 10 MEQ/100ML IV SOLN
10.0000 meq | INTRAVENOUS | Status: AC
  Administered 2022-04-23 (×5): 10 meq via INTRAVENOUS
  Filled 2022-04-23 (×5): qty 100

## 2022-04-23 MED ORDER — POTASSIUM CHLORIDE CRYS ER 20 MEQ PO TBCR
40.0000 meq | EXTENDED_RELEASE_TABLET | Freq: Two times a day (BID) | ORAL | Status: AC
  Administered 2022-04-23 (×2): 40 meq via ORAL
  Filled 2022-04-23 (×2): qty 2

## 2022-04-23 NOTE — Progress Notes (Signed)
[facility] ?PROGRESS NOTE ? ? ? ?Progress Note  ?Robin Guerrero  TMH:[id number] DOB: 02/25/1986 DOA: 04/22/2022 ?PCP: Trey Sailors, PA  ? ? ? ?Brief Narrative:  ? ?Robin Guerrero is an 36 y.o. female past medical history significant for alcoholic pancreatitis with pseudocyst as use of alcohol was January 2023 iron deficiency anemia type B aortic dissection presents complaining of intermittent abdominal pain radiating to her back nausea and vomiting for 1 week she was recently seen in the ED on 04/08/2022 declined admission and went home due to recurrent symptoms and pain came back to the ED on 04/16/2022 in the ED lipase was 112 CT scan of the abdomen pelvis showed diffuse pancreatitis without necrosis stable small fluid collection measuring 12 mm likely a small pseudocyst there may be associated splenic vein thrombosis ? ? ?Assessment/Plan:  ? ?Acute alcoholic pancreatitis with a pseudocyst: ?Started on IV fluids antiemetics Dilaudid for analgesics. ?Keep the patient n.p.o. patient continues to be nauseated and having pain. ?She has been counseled about the importance of staying abstinence from alcohol. ?She has a small pseudocyst about 12 mm. ?Continue IV fluids, strict I's and O's. ? ?Primary hypertension: ?Continue current home regimen, blood pressures well controlled. ? ?Hypokalemia: ?Replete IV recheck tomorrow morning. ? ?Normocytic anemia/iron deficiency anemia: ?With an RDW of 18 MCV of 96 question iron deficiency check an anemia panel. ?After fluid resuscitation her hemoglobin dropped from 10-8 likely hemodilutional. ? ?GERD: ?Continue PPI. ? ?Tobacco use: ?Has been counseled about cessation. ? ?DVT prophylaxis: lovenox ?Family Communication:none ?Status is: Observation ?The patient will require care spanning > 2 midnights and should be moved to inpatient because: Acute pancreatitis with ongoing pain and nausea. ? ? ? ?Code Status:  ? ?  ?Code Status Orders  ?(From admission, onward)  ?  ? ? ?   ? ?  Start     Ordered  ? 04/22/22 1934  Full code  Continuous       ? 04/22/22 1934  ? ?  ?  ? ?  ? ?Code Status History   ? ? Date Active Date Inactive Code Status Order ID Comments User Context  ? 02/20/2022 1606 02/24/2022 2018 Full Code [id number]  Lestine Mount, PA-C ED  ? 06/04/2021 0516 06/07/2021 2126 Full Code [id number]  Chotiner, [name], MD ED  ? 11/09/2020 2104 11/18/2020 2202 Full Code [id number]  Germain Osgood, PA-C ED  ? 08/18/2020 0955 08/28/2020 0110 Full Code [id number]  Harold Hedge, MD ED  ? ?  ? ? ? ? ?IV Access:  ? ?Peripheral IV ? ? ?Procedures and diagnostic studies:  ? ?CT Abdomen Pelvis W Contrast ? ?Result Date: 04/22/2022 ?CLINICAL DATA:  Abdominal pain, nausea, vomiting and pancreatitis. EXAM: CT ABDOMEN AND PELVIS WITH CONTRAST TECHNIQUE: Multidetector CT imaging of the abdomen and pelvis was performed using the standard protocol following bolus administration of intravenous contrast. RADIATION DOSE REDUCTION: This exam was performed according to the departmental dose-optimization program which includes automated exposure control, adjustment of the mA and/or kV according to patient size and/or use of iterative reconstruction technique. CONTRAST:  172m OMNIPAQUE IOHEXOL 300 MG/ML  SOLN COMPARISON:  CTA of the abdomen and pelvis on 03/29/2022 FINDINGS: Lower chest: No acute abnormality. Hepatobiliary: No focal liver abnormality is seen. No gallstones, gallbladder wall thickening, or biliary dilatation. Pancreas: Peripancreatic inflammation is similar to the recent CTA. No evidence pancreatic necrosis or ductal dilatation. Stable 12 mm small fluid collection in the posterior head of the pancreas near  the uncinate process. Spleen: Normal in size without focal abnormality. Adrenals/Urinary Tract: Adrenal glands are unremarkable. Kidneys are normal, without renal calculi, focal lesion, or hydronephrosis. Bladder is unremarkable. Stomach/Bowel: Bowel shows no evidence of obstruction, ileus,  inflammation or lesion. The appendix is normal. No free intraperitoneal air. 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 The vein appeared open in 2021 by CT but since that time a true venous phase study has not been performed. The portal vein and mesenteric veins appear normally patent. No lymphadenopathy identified. Reproductive: Stable mildly enlarged and lobulated uterus likely related to underlying fibroids. No adnexal masses. Other: Small amount of free fluid in the posterior pelvis. No hernias. Musculoskeletal: No acute or significant osseous findings. IMPRESSION: 1. Stable appearance of diffuse pancreatitis without necrosis or abscess formation. Stable small fluid collection in the posterior head of the pancreas measuring 12 mm likely represents a small pseudocyst. 2. There may be associated splenic vein thrombosis at the level of the splenic hilum with lack of a well delineated patent splenic vein on the current exam. 3. 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 Electronically Signed   By: Aletta Edouard M.D.   On: 04/22/2022 18:35   ? ? ?Medical Consultants:  ? ?None. ? ? ?Subjective:  ? ? ?Robin Guerrero try to eat it hurts she still nauseated. ? ?Objective:  ? ? ?Vitals:  ? 04/22/22 2007 04/22/22 2356 04/23/22 0440 04/23/22 0805  ?BP: 127/87 104/69 110/80 132/88  ?Pulse: 87 92 80 85  ?Resp: '20 18 18 20  '$ ?Temp: 98.5 ?F (36.9 ?C) 99 ?F (37.2 ?C) 98.2 ?F (36.8 ?C) 98.9 ?F (37.2 ?C)  ?TempSrc: Oral Oral Oral Oral  ?SpO2: 100% 94% 94% 97%  ?Weight:      ?Height:      ? ?SpO2: 97 % ? ? ?Intake/Output Summary (Last 24 hours) at 04/23/2022 0839 ?Last data filed at 04/23/2022 0300 ?Gross per 24 hour  ?Intake 906.25 ml  ?Output --  ?Net  906.25 ml  ? ?Filed Weights  ? 04/22/22 1936  ?Weight: 95 kg  ? ? ?Exam: ?General exam: In no acute distress. ?Respiratory system: Good air movement and clear to auscultation. ?Cardiovascular system: S1 & S2 heard, RRR. No JVD. ?Gastrointestinal system: Abdomen is nondistended, soft and nontender.  ?Extremities: No pedal edema. ?Skin: No rashes, lesions or ulcers ?Psychiatry: Judgement and insight appear normal. Mood & affect appropriate.  ? ? ?Data Reviewed:  ? ? ?Labs: ?Basic Metabolic Panel: ?Recent Labs  ?Lab 04/22/22 ?1423 04/23/22 ?0507  ?NA 140 139  ?K 3.5 3.2*  ?CL 115* 114*  ?CO2 19* 20*  ?GLUCOSE 125* 108*  ?BUN 8 8  ?CREATININE 0.75 0.63  ?CALCIUM 9.0 8.6*  ?MG  --  2.0  ?PHOS  --  3.4  ? ?GFR ?Estimated Creatinine Clearance: 119.2 mL/min (by C-G formula based on SCr of 0.63 mg/dL). ?Liver Function Tests: ?Recent Labs  ?Lab 04/22/22 ?1423 04/23/22 ?0507  ?AST 14* 11*  ?ALT 14 11  ?ALKPHOS 60 47  ?BILITOT 1.4* 0.7  ?PROT 7.5 6.3*  ?ALBUMIN 3.6 3.0*  ? ?Recent Labs  ?Lab 04/22/22 ?1423 04/23/22 ?0507  ?LIPASE 112* 69*  ? ?No results for input(s): AMMONIA in the last 168 hours. ?Coagulation profile ?No results for  input(s): INR, PROTIME in the last 168 hours. ?COVID-19 Labs ? ?No results for input(s): DDIMER, FERRITIN, LDH, CRP in the last 72 hours. ? ?Lab Results  ?Component Value Date  ? [place] NEGATIVE 02/20/2022  ? [place] NEGATIVE 06/04/2021  ? [place] NEGATIVE 11/09/2020  ? [place] NEGATIVE 08/18/2020  ? ? ?CBC: ?Recent Labs  ?Lab 04/22/22 ?1423 04/23/22 ?0507  ?WBC 9.7 5.3  ?NEUTROABS  --  3.5  ?HGB 10.5* 8.6*  ?HCT 31.9* 27.0*  ?MCV 93.8 96.4  ?PLT 291 202  ? ?Cardiac Enzymes: ?No results for input(s): CKTOTAL, CKMB, CKMBINDEX, TROPONINI in the last 168 hours. ?BNP (last 3 results) ?No results for input(s): PROBNP in the last 8760 hours. ?CBG: ?No results for input(s): GLUCAP in the last 168 hours. ?D-Dimer: ?No results for input(s): DDIMER in the last 72 hours. ?Hgb A1c: ?No results  for input(s): HGBA1C in the last 72 hours. ?Lipid Profile: ?No results for input(s): CHOL, HDL, LDLCALC, TRIG, CHOLHDL, LDLDIRECT in the last 72 hours. ?Thyroid function studies: ?No results for input

## 2022-04-24 DIAGNOSIS — K852 Alcohol induced acute pancreatitis without necrosis or infection: Secondary | ICD-10-CM | POA: Diagnosis not present

## 2022-04-24 DIAGNOSIS — D649 Anemia, unspecified: Secondary | ICD-10-CM | POA: Diagnosis not present

## 2022-04-24 DIAGNOSIS — K859 Acute pancreatitis without necrosis or infection, unspecified: Secondary | ICD-10-CM | POA: Diagnosis not present

## 2022-04-24 DIAGNOSIS — I10 Essential (primary) hypertension: Secondary | ICD-10-CM | POA: Diagnosis not present

## 2022-04-24 LAB — BASIC METABOLIC PANEL
Anion gap: 7 (ref 5–15)
BUN: 6 mg/dL (ref 6–20)
CO2: 18 mmol/L — ABNORMAL LOW (ref 22–32)
Calcium: 8.8 mg/dL — ABNORMAL LOW (ref 8.9–10.3)
Chloride: 115 mmol/L — ABNORMAL HIGH (ref 98–111)
Creatinine, Ser: 0.34 mg/dL — ABNORMAL LOW (ref 0.44–1.00)
GFR, Estimated: >60 mL/min (ref >60)
Glucose, Bld: 95 mg/dL (ref 70–99)
Potassium: 4.2 mmol/L (ref 3.5–5.1)
Sodium: 140 mmol/L (ref 135–145)

## 2022-04-24 MED ORDER — IBUPROFEN 200 MG PO TABS
200.0000 mg | ORAL_TABLET | Freq: Once | ORAL | Status: AC
  Administered 2022-04-24: 200 mg via ORAL
  Filled 2022-04-24: qty 1

## 2022-04-24 NOTE — TOC Initial Note (Signed)
Transition of Care (TOC) - Initial/Assessment Note  ? ? ?Patient Details  ?Name: Robin Guerrero ?MRN: [id number] ?Date of Birth: 09-16-1986 ? ?Transition of Care (TOC) CM/SW Contact:    ?Tullos, LCSW ?Phone Number: ?04/24/2022, 1:00 PM ? ?Clinical Narrative:                 ? ?CSW met with pt in response to substance use consult. Pt explains that she has not drank alcohol since January 2023. She states previous pancreatitis was related to exessive alcohol use but that she has since reduced alcohol use greatly. She states she was previously drinking a lot more which was related to her mother's death and a loss of job. She states now she rarely drinks and may have 1 drink on special occasions. She does not want substance use resources. She does mention trouble with tobacco cessation and medicaid paying for nicotine patches. CSW provided printed info for [place] quitline which may be able to provide free patches.  ?Expected Discharge Plan: Home/Self Care ?Barriers to Discharge: No Barriers Identified ? ? ?Patient Goals and CMS Choice ?  ?  ?  ? ?Expected Discharge Plan and Services ?Expected Discharge Plan: Home/Self Care ?  ?  ?  ?  ?Expected Discharge Date: 04/24/22               ?  ?  ?  ?  ?  ?  ?  ?  ?  ?  ? ?Prior Living Arrangements/Services ?  ?Lives with:: Minor Children ?  ?       ?  ?  ?  ?  ? ?Activities of Daily Living ?Home Assistive Devices/Equipment: None ?ADL Screening (condition at time of admission) ?Patient's cognitive ability adequate to safely complete daily activities?: Yes ?Is the patient deaf or have difficulty hearing?: No ?Does the patient have difficulty seeing, even when wearing glasses/contacts?: No ?Does the patient have difficulty concentrating, remembering, or making decisions?: No ?Patient able to express need for assistance with ADLs?: Yes ?Does the patient have difficulty dressing or bathing?: No ?Independently performs ADLs?: Yes (appropriate for developmental age) ?Does the  patient have difficulty walking or climbing stairs?: No ?Weakness of Legs: Both ?Weakness of Arms/Hands: Both ? ?Permission Sought/Granted ?  ?  ?   ?   ?   ?   ? ?Emotional Assessment ?  ?  ?  ?  ?Alcohol / Substance Use: Not Applicable ?Psych Involvement: No (comment) ? 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 Diagnosis Date Noted  ? Acute pancreatitis 04/22/2022  ? Aortic dissection ([place]) 02/20/2022  ? Acute renal failure superimposed on stage 3a chronic kidney disease ([place]) 06/05/2021  ? 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 ARF (acute renal failure) ([place]) 06/04/2021  ? Pancreatitis 06/04/2021  ? H/O aortic dissection 01/31/2021  ? 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 Malignant hypertension   ? Nausea   ? Back pain   ? Hypocalcemia 08/19/2020  ? Acute alcoholic pancreatitis 99/37/1696  ? Thrombocytopenia ([place]) 08/19/2020  ? Normocytic anemia 08/19/2020  ? Alcoholic pancreatitis 78/93/8101  ? SIRS (systemic inflammatory response syndrome) ([place]) 08/18/2020  ? Hypokalemia 08/18/2020  ? Hypomagnesemia 08/18/2020  ? Prolonged QT interval 08/18/2020  ? Primary hypertension 08/18/2020  ? Depression 08/18/2020  ? Tobacco use 08/18/2020  ? ?PCP:  Trey Sailors, PA ?Pharmacy:   ?[facility]  [place] [address] [address] ?[place]. ?[place] [address] ?Phone: [phone] Fax: [phone] ? ?[facility] ?[address] ?[place] [address] ?Phone: [phone] Fax: [phone] ? ? ? ? ?Social Determinants of Health (SDOH) Interventions ?  ? ?Readmission Risk Interventions ?   ? View : No data to display.  ?  ?  ?  ? ? ? ?

## 2022-04-24 NOTE — Plan of Care (Signed)

## 2022-04-24 NOTE — Discharge Summary (Signed)
?Physician Discharge Summary ?  ?Patient: Robin Guerrero MRN: [id number] DOB: [birth year]  ?Admit date:     04/22/2022  ?Discharge date: 04/24/22  ?Discharge Physician: Oswald Hillock  ? ?PCP: Trey Sailors, PA  ? ?Recommendations at discharge:  ? ?Follow-up gastroenterology, Dr. Ronnette Juniper in 2 weeks ? ? ?Discharge Diagnoses: ?Principal Problem: ?  Acute pancreatitis ?Active Problems: ?  Primary hypertension ?  Acute alcoholic pancreatitis ?  Normocytic anemia ? ?Resolved Problems: ?  * No resolved hospital problems. * ? ?Hospital Course: ?36 y.o. female past medical history significant for alcoholic pancreatitis with pseudocyst as use of alcohol was January 2023 iron deficiency anemia type B aortic dissection presents complaining of intermittent abdominal pain radiating to her back nausea and vomiting for 1 week she was recently seen in the ED on 04/08/2022 declined admission and went home due to recurrent symptoms and pain came back to the ED on 04/16/2022 in the ED lipase was 112 CT scan of the abdomen pelvis showed diffuse pancreatitis without necrosis stable small fluid collection measuring 12 mm likely a small pseudocyst there may be associated splenic vein thrombosis ?  ? ?Assessment and Plan: ? ?Acute alcoholic pancreatitis with pseudocyst ?-CT scan abdomen pelvis shows diffuse pancreatitis without necrosis, stable small fluid collection measuring 12 mm likely small pseudocyst with associated splenic vein thrombosis ?-Patient was kept n.p.o., started on IV fluids ?-Lipase down to 69 ?-Patient started on soft diet, tolerating diet well ?-Denies abdominal pain, nausea or vomiting ?-Wants to go home ?-Called and discussed with [facility], Dr. Megan Salon, she reviewed the images and recommends patient to be followed up with [facility] in 4 weeks for repeat imaging with IV contrast ? 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 ?Hypertension ?-Blood pressure is  well controlled ?-Continue multiple medications as per home regimen ? ?Hypokalemia ?-Replete ? ?Normocytic anemia/iron deficiency anemia ?-Anemia panel showed iron saturation of 5% ?-Continue iron supplementation ? ?Chronic type B aortic dissection ?-Seen by vascular surgery ?-No intervention recommended as per note from 04/02/2022 by vascular surgery ?-Patient to follow-up with vascular surgery as outpatient ? ? ? ?  ? ? ?Consultants:  ?Procedures performed:  ?Disposition: Home ?Diet recommendation:  ?Discharge Diet Orders (From admission, onward)  ? ?  Start     Ordered  ? 04/24/22 0000  Diet - low sodium heart healthy       ? 04/24/22 1142  ? ?  ?  ? ?  ? ?Regular diet ?DISCHARGE MEDICATION: ?Allergies as of 04/24/2022   ? ?   Reactions  ? Ativan [lorazepam] Other (See Comments)  ? Disorientated, combative  ? ?  ? ?  ?Medication List  ?  ? ?STOP taking these medications   ? ?ibuprofen 200 MG tablet ?Commonly known as: ADVIL ?  ? ?  ? ?TAKE these medications   ? ?amLODipine 10 MG tablet ?Commonly known as: NORVASC ?Take 1 tablet (10 mg total) by mouth daily. ?  ?carvedilol 25 MG tablet ?Commonly known as: COREG ?Take 1 tablet (25 mg total) by mouth 2 (two) times daily with a meal. ?  ?cloNIDine 0.2 MG tablet ?Commonly known as: CATAPRES ?Take 1 tablet (0.2 mg total) by mouth every 8 (eight) hours. ?  ?ferrous sulfate 325 (65 FE) MG tablet ?TAKE 1 TABLET BY MOUTH EVERY DAY WITH BREAKFAST ?What changed: See the new instructions. ?  ?hydrALAZINE 100 MG tablet ?Commonly known as: APRESOLINE ?Take 1 tablet (100  mg total) by mouth 3 (three) times daily. ?  ?losartan 100 MG tablet ?Commonly known as: COZAAR ?Take 100 mg by mouth daily. ?What changed: Another medication with the same name was removed. Continue taking this medication, and follow the directions you see here. ?  ?metoCLOPramide 10 MG tablet ?Commonly known as: REGLAN ?Take 1 tablet (10 mg total) by mouth every 6 (six) hours. ?What changed: when to take this ?   ?multivitamin with minerals Tabs tablet ?Take 1 tablet by mouth daily. ?  ?omeprazole 20 MG capsule ?Commonly known as: PRILOSEC ?Take 1 capsule (20 mg total) by mouth 2 (two) times daily before a meal. ?  ? ?  ? ? Follow-up Information   ? ? Ronnette Juniper, MD. Schedule an appointment as soon as possible for a visit in 2 week(s).   ?Specialty: Gastroenterology ?Contact information: ?[address] ?[address] ?[place] [address] ?[phone] ? ? ?  ?  ? ?  ?  ? ?  ? ?Discharge Exam: ?Danley Danker Weights  ? 04/22/22 1936  ?Weight: 95 kg  ? ?General-appears in no acute distress ?Heart-S1-S2, regular, no murmur auscultated ?Lungs-clear to auscultation bilaterally, no wheezing or crackles auscultated ?Abdomen-soft, nontender, no organomegaly ?Extremities-no edema in the lower extremities ?Neuro-alert, oriented x3, no focal deficit noted ? ?Condition at discharge: good ? ?The results of significant diagnostics from this hospitalization (including imaging, microbiology, ancillary and laboratory) are listed below for reference.  ? ?Imaging Studies: ?CT Abdomen Pelvis W Contrast ? ?Result Date: 04/22/2022 ?CLINICAL DATA:  Abdominal pain, nausea, vomiting and pancreatitis. EXAM: CT ABDOMEN AND PELVIS WITH CONTRAST TECHNIQUE: Multidetector CT imaging of the abdomen and pelvis was performed using the standard protocol following bolus administration of intravenous contrast. RADIATION DOSE REDUCTION: This exam was performed according to the departmental dose-optimization program which includes automated exposure control, adjustment of the mA and/or kV according to patient size and/or use of iterative reconstruction technique. CONTRAST:  151m OMNIPAQUE IOHEXOL 300 MG/ML  SOLN COMPARISON:  CTA of the abdomen and pelvis on 03/29/2022 FINDINGS: Lower chest: No acute abnormality. Hepatobiliary: No focal liver abnormality is seen. No gallstones, gallbladder wall thickening, or biliary dilatation. Pancreas: Peripancreatic inflammation is  similar to the recent CTA. No evidence pancreatic necrosis or ductal dilatation. Stable 12 mm small fluid collection in the posterior head of the pancreas near the uncinate process. Spleen: Normal in size without focal abnormality. Adrenals/Urinary Tract: Adrenal glands are unremarkable. Kidneys are normal, without renal calculi, focal lesion, or hydronephrosis. Bladder is unremarkable. Stomach/Bowel: Bowel shows no evidence of obstruction, ileus, inflammation or lesion. The appendix is normal. No free intraperitoneal air. 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 The vein appeared open in 2021 by CT but since that time a true venous phase study has not been performed. The portal vein and mesenteric veins appear normally patent. No lymphadenopathy identified. Reproductive: Stable mildly enlarged and lobulated uterus likely related to underlying fibroids. No adnexal masses. Other: Small amount of free fluid in the posterior pelvis. No hernias. Musculoskeletal: No acute or significant osseous findings. IMPRESSION: 1. Stable appearance of diffuse pancreatitis without necrosis or abscess formation. Stable small fluid collection in the posterior head of the pancreas measuring 12 mm likely represents a small pseudocyst. 2. There may be associated splenic vein thrombosis at the level of  the splenic hilum with lack of a well delineated patent splenic vein on the current exam. 3. 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 Electronically Signed   By: Aletta Edouard M.D.   On: 04/22/2022 18:35  ? ?CT ANGIO CHEST AORTA W/CM & OR WO/CM ? ?Result Date: 03/29/2022 ?CLINICAL DATA:  Aortic dissection.  Preoperative planning EXAM: CT ANGIOGRAPHY CHEST,  ABDOMEN AND PELVIS TECHNIQUE: Non-contrast CT of the chest was initially obtained. Multidetector CT imaging through the chest, abdomen and pelvis was performed using the standard protocol during bolus admin

## 2022-04-24 NOTE — Progress Notes (Signed)
MD order to discharge. ? ?D/C instruction and work excuse given to pt. ? ?She verbalizes understanding of discharge instructions and aware of follow up appointment with GI. ?

## 2022-05-02 ENCOUNTER — Other Ambulatory Visit: Payer: Self-pay | Admitting: Gastroenterology

## 2022-05-02 DIAGNOSIS — K863 Pseudocyst of pancreas: Secondary | ICD-10-CM

## 2022-05-17 ENCOUNTER — Other Ambulatory Visit: Payer: Self-pay | Admitting: Gastroenterology

## 2022-05-17 ENCOUNTER — Ambulatory Visit
Admission: RE | Admit: 2022-05-17 | Discharge: 2022-05-17 | Disposition: A | Discharge status: Home / Self Care | Payer: 59 | Source: Ambulatory Visit | Attending: Gastroenterology | Admitting: Gastroenterology

## 2022-05-17 DIAGNOSIS — K863 Pseudocyst of pancreas: Secondary | ICD-10-CM

## 2022-05-17 MED ORDER — IOPAMIDOL (ISOVUE-300) INJECTION 61%
100.0000 mL | Freq: Once | INTRAVENOUS | Status: AC | PRN
  Administered 2022-05-17: 100 mL via INTRAVENOUS

## 2022-05-20 ENCOUNTER — Other Ambulatory Visit: Payer: Self-pay | Admitting: Obstetrics and Gynecology

## 2022-05-20 ENCOUNTER — Telehealth: Payer: Self-pay | Admitting: Gastroenterology

## 2022-05-20 NOTE — Telephone Encounter (Signed)
Good morning Dr. Rush Landmark.  Please see referral from Dr. Therisa Doyne regarding this patient. For possible EUS. Thank you.

## 2022-05-21 NOTE — Telephone Encounter (Signed)
Waiting for Dr Rush Landmark to review

## 2022-05-21 NOTE — Telephone Encounter (Signed)
Inbound call from patient stating she is ready to schedule for her ''EUS''. Please give patient a call back to advise.  Thank you

## 2022-05-22 ENCOUNTER — Telehealth: Payer: Self-pay | Admitting: Gastroenterology

## 2022-05-22 NOTE — Telephone Encounter (Signed)
The pt has been scheduled for 07/05/22 at 3:50 pm for office visit with GM.

## 2022-05-22 NOTE — Telephone Encounter (Signed)
Patient called stating she did want the July 5 appointment with Dr. Rush Landmark, but also if one becomes available earlier, she'd like that.  Please call patient and advise.  Thank you.

## 2022-05-22 NOTE — Telephone Encounter (Signed)
Robin Guerrero, I have reviewed the imaging. [place] portal fluid collection. Not sure that a true cystgastrostomy is necessary at this point though it is maturing and aspiration could be possible. She can be offered a clinic visit with me (okay for a 3:50 PM or held slot visit) and then we can discuss timing of potential EUS after we have discussed her symptoms and potentially after repeat follow-up imaging is performed. Based on my availability this likely will not be until July (if we have an opening develop on our schedule) or August for procedure. Please offer her a clinic visit.  Thanks. GM   FYI [place] about my availability for advanced procedures due to hospital.  If you feel this is too far out, then referral to one of the quaternary centers may be necessary. We will work on scheduling the clinic visit otherwise.   Thanks. GM

## 2022-05-22 NOTE — Telephone Encounter (Signed)
Returned pt call and made her aware that the appt for 7/5 has been taken.  She was given 7/21 at 350 pm.  She will be called if there are any sooner appts.  She will also speak with the referring about alternate referral.

## 2022-05-30 ENCOUNTER — Other Ambulatory Visit: Payer: 59

## 2022-05-30 ENCOUNTER — Other Ambulatory Visit: Payer: Self-pay

## 2022-05-30 DIAGNOSIS — I71019 Dissection of thoracic aorta, unspecified: Secondary | ICD-10-CM

## 2022-06-03 ENCOUNTER — Encounter (HOSPITAL_COMMUNITY): Payer: Self-pay

## 2022-06-03 ENCOUNTER — Inpatient Hospital Stay (HOSPITAL_COMMUNITY)
Admission: EM | Admit: 2022-06-03 | Discharge: 2022-06-08 | DRG: 438 | Disposition: A | Discharge status: Home / Self Care | Payer: 59 | Attending: Internal Medicine | Admitting: Internal Medicine

## 2022-06-03 ENCOUNTER — Emergency Department (HOSPITAL_COMMUNITY): Payer: 59

## 2022-06-03 DIAGNOSIS — K863 Pseudocyst of pancreas: Secondary | ICD-10-CM | POA: Diagnosis not present

## 2022-06-03 DIAGNOSIS — R109 Unspecified abdominal pain: Secondary | ICD-10-CM | POA: Diagnosis not present

## 2022-06-03 DIAGNOSIS — Z888 Allergy status to other drugs, medicaments and biological substances status: Secondary | ICD-10-CM

## 2022-06-03 DIAGNOSIS — K219 Gastro-esophageal reflux disease without esophagitis: Secondary | ICD-10-CM | POA: Diagnosis present

## 2022-06-03 DIAGNOSIS — D509 Iron deficiency anemia, unspecified: Secondary | ICD-10-CM | POA: Diagnosis present

## 2022-06-03 DIAGNOSIS — I10 Essential (primary) hypertension: Secondary | ICD-10-CM | POA: Diagnosis present

## 2022-06-03 DIAGNOSIS — D62 Acute posthemorrhagic anemia: Secondary | ICD-10-CM | POA: Diagnosis present

## 2022-06-03 DIAGNOSIS — K861 Other chronic pancreatitis: Secondary | ICD-10-CM | POA: Diagnosis present

## 2022-06-03 DIAGNOSIS — Z79899 Other long term (current) drug therapy: Secondary | ICD-10-CM

## 2022-06-03 DIAGNOSIS — I71012 Dissection of descending thoracic aorta: Secondary | ICD-10-CM | POA: Diagnosis present

## 2022-06-03 DIAGNOSIS — R195 Other fecal abnormalities: Secondary | ICD-10-CM | POA: Diagnosis present

## 2022-06-03 DIAGNOSIS — N179 Acute kidney failure, unspecified: Secondary | ICD-10-CM | POA: Diagnosis present

## 2022-06-03 DIAGNOSIS — I864 Gastric varices: Secondary | ICD-10-CM | POA: Diagnosis present

## 2022-06-03 DIAGNOSIS — R112 Nausea with vomiting, unspecified: Secondary | ICD-10-CM

## 2022-06-03 DIAGNOSIS — K920 Hematemesis: Secondary | ICD-10-CM | POA: Diagnosis present

## 2022-06-03 DIAGNOSIS — F1011 Alcohol abuse, in remission: Secondary | ICD-10-CM | POA: Diagnosis present

## 2022-06-03 DIAGNOSIS — F1010 Alcohol abuse, uncomplicated: Secondary | ICD-10-CM | POA: Diagnosis present

## 2022-06-03 DIAGNOSIS — Z8249 Family history of ischemic heart disease and other diseases of the circulatory system: Secondary | ICD-10-CM

## 2022-06-03 DIAGNOSIS — Z803 Family history of malignant neoplasm of breast: Secondary | ICD-10-CM

## 2022-06-03 DIAGNOSIS — F17210 Nicotine dependence, cigarettes, uncomplicated: Secondary | ICD-10-CM | POA: Diagnosis present

## 2022-06-03 DIAGNOSIS — I7102 Dissection of abdominal aorta: Secondary | ICD-10-CM | POA: Diagnosis present

## 2022-06-03 DIAGNOSIS — G8929 Other chronic pain: Secondary | ICD-10-CM | POA: Diagnosis present

## 2022-06-03 DIAGNOSIS — K859 Acute pancreatitis without necrosis or infection, unspecified: Secondary | ICD-10-CM | POA: Diagnosis present

## 2022-06-03 DIAGNOSIS — R531 Weakness: Secondary | ICD-10-CM | POA: Diagnosis present

## 2022-06-03 LAB — COMPREHENSIVE METABOLIC PANEL
ALT: 9 U/L (ref 0–44)
AST: 12 U/L — ABNORMAL LOW (ref 15–41)
Albumin: 3.7 g/dL (ref 3.5–5.0)
Alkaline Phosphatase: 55 U/L (ref 38–126)
Anion gap: 7 (ref 5–15)
BUN: 13 mg/dL (ref 6–20)
CO2: 21 mmol/L — ABNORMAL LOW (ref 22–32)
Calcium: 9 mg/dL (ref 8.9–10.3)
Chloride: 112 mmol/L — ABNORMAL HIGH (ref 98–111)
Creatinine, Ser: 1.15 mg/dL — ABNORMAL HIGH (ref 0.44–1.00)
GFR, Estimated: >60 mL/min (ref >60)
Glucose, Bld: 101 mg/dL — ABNORMAL HIGH (ref 70–99)
Potassium: 3.7 mmol/L (ref 3.5–5.1)
Sodium: 140 mmol/L (ref 135–145)
Total Bilirubin: 0.9 mg/dL (ref 0.3–1.2)
Total Protein: 8.1 g/dL (ref 6.5–8.1)

## 2022-06-03 LAB — URINALYSIS, ROUTINE W REFLEX MICROSCOPIC
Bilirubin Urine: NEGATIVE
Glucose, UA: NEGATIVE mg/dL
Hgb urine dipstick: NEGATIVE
Ketones, ur: NEGATIVE mg/dL
Leukocytes,Ua: NEGATIVE
Nitrite: NEGATIVE
Protein, ur: NEGATIVE mg/dL
Specific Gravity, Urine: <=1.005 (ref 1.005–1.030)
pH: 5 (ref 5.0–8.0)

## 2022-06-03 LAB — CBC WITH DIFFERENTIAL/PLATELET
Abs Immature Granulocytes: 0.09 10*3/uL — ABNORMAL HIGH (ref 0.00–0.07)
Basophils Absolute: 0 10*3/uL (ref 0.0–0.1)
Basophils Relative: 0 %
Eosinophils Absolute: 0.2 10*3/uL (ref 0.0–0.5)
Eosinophils Relative: 2 %
HCT: 23.7 % — ABNORMAL LOW (ref 36.0–46.0)
Hemoglobin: 7.1 g/dL — ABNORMAL LOW (ref 12.0–15.0)
Immature Granulocytes: 1 %
Lymphocytes Relative: 16 %
Lymphs Abs: 1.6 10*3/uL (ref 0.7–4.0)
MCH: 27.5 pg (ref 26.0–34.0)
MCHC: 30 g/dL (ref 30.0–36.0)
MCV: 91.9 fL (ref 80.0–100.0)
Monocytes Absolute: 0.7 10*3/uL (ref 0.1–1.0)
Monocytes Relative: 7 %
Neutro Abs: 7.7 10*3/uL (ref 1.7–7.7)
Neutrophils Relative %: 74 %
Platelets: 346 10*3/uL (ref 150–400)
RBC: 2.58 MIL/uL — ABNORMAL LOW (ref 3.87–5.11)
RDW: 15.9 % — ABNORMAL HIGH (ref 11.5–15.5)
WBC: 10.3 10*3/uL (ref 4.0–10.5)
nRBC: 0.9 % — ABNORMAL HIGH (ref 0.0–0.2)

## 2022-06-03 LAB — I-STAT BETA HCG BLOOD, ED (MC, WL, AP ONLY): I-stat hCG, quantitative: <5 m[IU]/mL (ref <5)

## 2022-06-03 LAB — LIPASE, BLOOD: Lipase: 75 U/L — ABNORMAL HIGH (ref 11–51)

## 2022-06-03 MED ORDER — IOHEXOL 300 MG/ML  SOLN
100.0000 mL | Freq: Once | INTRAMUSCULAR | Status: AC | PRN
  Administered 2022-06-03: 100 mL via INTRAVENOUS

## 2022-06-03 NOTE — ED Triage Notes (Signed)
Pt arrived via POV, c/o vomiting x3 days. Denies any diarrhea, urinary issues or fever. States hx of pancreatitis

## 2022-06-03 NOTE — ED Provider Triage Note (Signed)
Emergency Medicine Provider Triage Evaluation Note  Robin Guerrero , a 36 y.o. female  was evaluated in triage.  Pt complains of abdominal pain, nausea, and vomiting.  She states that same began on Saturday and has been worsening since then.  She states that this pain feels like her historical pancreatitis pain.  She states that he has she has an appointment with [facility] on the 25th but did not feel that she could make it until then.  She denies any diarrhea, states that she has been constipated recently.  Review of Systems  Positive:  Negative:   Physical Exam  BP 112/83 (BP Location: Left Arm)   Pulse (!) 104   Temp 98.8 F (37.1 C) (Oral)   Resp 16   Ht '5\' 9"'$  (1.753 m)   Wt 88 kg   SpO2 100%   BMI 28.65 kg/m  Gen:   Awake, no distress   Resp:  Normal effort  MSK:   Moves extremities without difficulty  Other:    Medical Decision Making  Medically screening exam initiated at 4:06 PM.  Appropriate orders placed.  Robin Guerrero was informed that the remainder of the evaluation will be completed by another provider, this initial triage assessment does not replace that evaluation, and the importance of remaining in the ED until their evaluation is complete.     Robin Face, PA-C 06/03/22 1608

## 2022-06-04 ENCOUNTER — Encounter (HOSPITAL_COMMUNITY): Payer: Self-pay | Admitting: Internal Medicine

## 2022-06-04 ENCOUNTER — Other Ambulatory Visit: Payer: Self-pay

## 2022-06-04 DIAGNOSIS — K863 Pseudocyst of pancreas: Principal | ICD-10-CM

## 2022-06-04 DIAGNOSIS — K2971 Gastritis, unspecified, with bleeding: Secondary | ICD-10-CM | POA: Diagnosis not present

## 2022-06-04 DIAGNOSIS — K861 Other chronic pancreatitis: Secondary | ICD-10-CM

## 2022-06-04 DIAGNOSIS — N179 Acute kidney failure, unspecified: Secondary | ICD-10-CM | POA: Diagnosis present

## 2022-06-04 DIAGNOSIS — R109 Unspecified abdominal pain: Secondary | ICD-10-CM | POA: Diagnosis present

## 2022-06-04 DIAGNOSIS — Z803 Family history of malignant neoplasm of breast: Secondary | ICD-10-CM | POA: Diagnosis not present

## 2022-06-04 DIAGNOSIS — I864 Gastric varices: Secondary | ICD-10-CM | POA: Diagnosis present

## 2022-06-04 DIAGNOSIS — Z888 Allergy status to other drugs, medicaments and biological substances status: Secondary | ICD-10-CM | POA: Diagnosis not present

## 2022-06-04 DIAGNOSIS — F1011 Alcohol abuse, in remission: Secondary | ICD-10-CM

## 2022-06-04 DIAGNOSIS — G8929 Other chronic pain: Secondary | ICD-10-CM | POA: Diagnosis present

## 2022-06-04 DIAGNOSIS — K219 Gastro-esophageal reflux disease without esophagitis: Secondary | ICD-10-CM | POA: Diagnosis present

## 2022-06-04 DIAGNOSIS — I10 Essential (primary) hypertension: Secondary | ICD-10-CM | POA: Diagnosis present

## 2022-06-04 DIAGNOSIS — D62 Acute posthemorrhagic anemia: Secondary | ICD-10-CM

## 2022-06-04 DIAGNOSIS — F17210 Nicotine dependence, cigarettes, uncomplicated: Secondary | ICD-10-CM | POA: Diagnosis present

## 2022-06-04 DIAGNOSIS — K920 Hematemesis: Secondary | ICD-10-CM | POA: Diagnosis present

## 2022-06-04 DIAGNOSIS — Z79899 Other long term (current) drug therapy: Secondary | ICD-10-CM | POA: Diagnosis not present

## 2022-06-04 DIAGNOSIS — K859 Acute pancreatitis without necrosis or infection, unspecified: Secondary | ICD-10-CM

## 2022-06-04 DIAGNOSIS — R531 Weakness: Secondary | ICD-10-CM | POA: Diagnosis present

## 2022-06-04 DIAGNOSIS — I71012 Dissection of descending thoracic aorta: Secondary | ICD-10-CM

## 2022-06-04 DIAGNOSIS — I7102 Dissection of abdominal aorta: Secondary | ICD-10-CM | POA: Diagnosis present

## 2022-06-04 DIAGNOSIS — D509 Iron deficiency anemia, unspecified: Secondary | ICD-10-CM | POA: Diagnosis present

## 2022-06-04 DIAGNOSIS — F1010 Alcohol abuse, uncomplicated: Secondary | ICD-10-CM | POA: Diagnosis present

## 2022-06-04 DIAGNOSIS — R195 Other fecal abnormalities: Secondary | ICD-10-CM | POA: Diagnosis present

## 2022-06-04 DIAGNOSIS — Z8249 Family history of ischemic heart disease and other diseases of the circulatory system: Secondary | ICD-10-CM | POA: Diagnosis not present

## 2022-06-04 LAB — CBC
HCT: 19.4 % — ABNORMAL LOW (ref 36.0–46.0)
HCT: 23.2 % — ABNORMAL LOW (ref 36.0–46.0)
HCT: 25.4 % — ABNORMAL LOW (ref 36.0–46.0)
Hemoglobin: 5.7 g/dL — CL (ref 12.0–15.0)
Hemoglobin: 7 g/dL — ABNORMAL LOW (ref 12.0–15.0)
Hemoglobin: 8 g/dL — ABNORMAL LOW (ref 12.0–15.0)
MCH: 27.5 pg (ref 26.0–34.0)
MCH: 28.6 pg (ref 26.0–34.0)
MCH: 28.9 pg (ref 26.0–34.0)
MCHC: 29.4 g/dL — ABNORMAL LOW (ref 30.0–36.0)
MCHC: 30.2 g/dL (ref 30.0–36.0)
MCHC: 31.5 g/dL (ref 30.0–36.0)
MCV: 93.7 fL (ref 80.0–100.0)
MCV: 94.7 fL (ref 80.0–100.0)
MCV: 91.7 fL (ref 80.0–100.0)
Platelets: 245 10*3/uL (ref 150–400)
Platelets: 211 10*3/uL (ref 150–400)
Platelets: 215 10*3/uL (ref 150–400)
RBC: 2.07 MIL/uL — ABNORMAL LOW (ref 3.87–5.11)
RBC: 2.45 MIL/uL — ABNORMAL LOW (ref 3.87–5.11)
RBC: 2.77 MIL/uL — ABNORMAL LOW (ref 3.87–5.11)
RDW: 16.3 % — ABNORMAL HIGH (ref 11.5–15.5)
RDW: 15.9 % — ABNORMAL HIGH (ref 11.5–15.5)
RDW: 15.4 % (ref 11.5–15.5)
WBC: 7.6 10*3/uL (ref 4.0–10.5)
WBC: 7.2 10*3/uL (ref 4.0–10.5)
WBC: 7.3 10*3/uL (ref 4.0–10.5)
nRBC: 0.5 % — ABNORMAL HIGH (ref 0.0–0.2)
nRBC: 0.7 % — ABNORMAL HIGH (ref 0.0–0.2)
nRBC: 0.4 % — ABNORMAL HIGH (ref 0.0–0.2)

## 2022-06-04 LAB — RETICULOCYTES
Immature Retic Fract: 31.2 % — ABNORMAL HIGH (ref 2.3–15.9)
RBC.: 2.36 MIL/uL — ABNORMAL LOW (ref 3.87–5.11)
Retic Count, Absolute: 118.9 10*3/uL (ref 19.0–186.0)
Retic Ct Pct: 5 % — ABNORMAL HIGH (ref 0.4–3.1)

## 2022-06-04 LAB — VITAMIN B12: Vitamin B-12: 219 pg/mL (ref 180–914)

## 2022-06-04 LAB — PREPARE RBC (CROSSMATCH)

## 2022-06-04 LAB — PROTIME-INR
INR: 1.1 (ref 0.8–1.2)
Prothrombin Time: 14.3 seconds (ref 11.4–15.2)

## 2022-06-04 LAB — IRON AND TIBC
Iron: 16 ug/dL — ABNORMAL LOW (ref 28–170)
Saturation Ratios: 4 % — ABNORMAL LOW (ref 10.4–31.8)
TIBC: 381 ug/dL (ref 250–450)
UIBC: 365 ug/dL

## 2022-06-04 LAB — FOLATE: Folate: 8.4 ng/mL (ref >5.9)

## 2022-06-04 LAB — APTT: aPTT: 29 seconds (ref 24–36)

## 2022-06-04 LAB — FERRITIN: Ferritin: 11 ng/mL (ref 11–307)

## 2022-06-04 MED ORDER — ACETAMINOPHEN 325 MG PO TABS
650.0000 mg | ORAL_TABLET | Freq: Four times a day (QID) | ORAL | Status: DC | PRN
  Administered 2022-06-05: 650 mg via ORAL
  Filled 2022-06-04: qty 2

## 2022-06-04 MED ORDER — POLYETHYLENE GLYCOL 3350 17 G PO PACK
17.0000 g | PACK | Freq: Every day | ORAL | Status: DC | PRN
  Administered 2022-06-06: 17 g via ORAL
  Filled 2022-06-04: qty 1

## 2022-06-04 MED ORDER — PANTOPRAZOLE SODIUM 40 MG IV SOLR
40.0000 mg | Freq: Once | INTRAVENOUS | Status: AC
  Administered 2022-06-04: 40 mg via INTRAVENOUS
  Filled 2022-06-04: qty 10

## 2022-06-04 MED ORDER — LACTATED RINGERS IV SOLN: INTRAVENOUS | Status: AC

## 2022-06-04 MED ORDER — PROCHLORPERAZINE EDISYLATE 10 MG/2ML IJ SOLN
10.0000 mg | Freq: Four times a day (QID) | INTRAMUSCULAR | Status: DC | PRN
  Administered 2022-06-04 – 2022-06-08 (×7): 10 mg via INTRAVENOUS
  Filled 2022-06-04 (×8): qty 2

## 2022-06-04 MED ORDER — ACETAMINOPHEN 650 MG RE SUPP: 650.0000 mg | Freq: Four times a day (QID) | RECTAL | Status: DC | PRN

## 2022-06-04 MED ORDER — HYDROMORPHONE HCL 1 MG/ML IJ SOLN
1.0000 mg | Freq: Once | INTRAMUSCULAR | Status: AC
  Administered 2022-06-04: 1 mg via INTRAVENOUS
  Filled 2022-06-04: qty 1

## 2022-06-04 MED ORDER — ONDANSETRON HCL 4 MG/2ML IJ SOLN
4.0000 mg | Freq: Once | INTRAMUSCULAR | Status: AC
  Administered 2022-06-04: 4 mg via INTRAVENOUS
  Filled 2022-06-04: qty 2

## 2022-06-04 MED ORDER — SODIUM CHLORIDE 0.9% IV SOLUTION: Freq: Once | INTRAVENOUS | Status: AC

## 2022-06-04 MED ORDER — HYDROMORPHONE HCL 1 MG/ML IJ SOLN
1.0000 mg | INTRAMUSCULAR | Status: DC | PRN
  Administered 2022-06-04 – 2022-06-07 (×4): 1 mg via INTRAVENOUS
  Filled 2022-06-04 (×4): qty 1

## 2022-06-04 MED ORDER — SODIUM CHLORIDE 0.9 % IV BOLUS
1000.0000 mL | Freq: Once | INTRAVENOUS | Status: AC
  Administered 2022-06-04: 1000 mL via INTRAVENOUS

## 2022-06-04 MED ORDER — PANTOPRAZOLE SODIUM 40 MG IV SOLR
40.0000 mg | Freq: Two times a day (BID) | INTRAVENOUS | Status: DC
  Administered 2022-06-04 – 2022-06-08 (×9): 40 mg via INTRAVENOUS
  Filled 2022-06-04 (×9): qty 10

## 2022-06-04 MED ORDER — HYDROMORPHONE HCL 1 MG/ML IJ SOLN
0.5000 mg | INTRAMUSCULAR | Status: DC | PRN
  Administered 2022-06-04 – 2022-06-06 (×2): 0.5 mg via INTRAVENOUS
  Filled 2022-06-04 (×2): qty 0.5

## 2022-06-04 MED ORDER — HYDRALAZINE HCL 20 MG/ML IJ SOLN
10.0000 mg | Freq: Four times a day (QID) | INTRAMUSCULAR | Status: DC | PRN
  Administered 2022-06-04: 10 mg via INTRAVENOUS
  Filled 2022-06-04: qty 1

## 2022-06-04 MED ORDER — HYDRALAZINE HCL 20 MG/ML IJ SOLN: 10.0000 mg | Freq: Four times a day (QID) | INTRAMUSCULAR | Status: DC | PRN

## 2022-06-04 NOTE — Assessment & Plan Note (Signed)
·   Please see assessment and plan above °

## 2022-06-04 NOTE — Assessment & Plan Note (Signed)
   Intravenous PPI as noted above

## 2022-06-04 NOTE — Progress Notes (Signed)
Care started prior to midnight in the emergency room and patient was admitted early this morning after midnight by Dr. Inda Merlin and I am in current agreement with his assessment and plan.  Additional changes at the plan of care been made accordingly the patient is an overweight African-American female with a past medical history significant for but not limited to hypertension, alcohol abuse, iron deficiency anemia, history of type B aortic dissection diagnosed in 2001 and follows with Dr. Standley Dakins, GERD as well as other history of multiple episodes of acute on chronic pancreatitis in the past recently complicated by pseudocyst formation back in May 2023 presented to [facility] ED with complaints of abdominal pain, nausea vomiting.  She explains that she had started develop abdominal pain on Friday and abdominal pain is epigastric in location and described it as sharp in quality but severe in intensity that wraps around to the back.  She is also had associated bouts of nausea vomiting the patient's symptoms persisted over neck several days and became associated with small bouts of blood mixed in her vomitus.  She denies any diarrhea or blood in her stools.  She denies any sick fevers and reports that her next appoint with Dr. Shaune Spittle already was in the month for now.  Given her progressively worsening nausea, vomiting and abdominal pain she eventually presented to the left lung for further evaluation was found to have a low hemoglobin of 7.1 down from 10.5 on 04/22/2022 which further dropped to 5.7.  CT scan of the abdomen pelvis done revealed an interval enlargement of the fluid collection in the porta hepatis consistent with enlarging pseudocyst with narrowing of the main portal vein and cystic enlargement of the head of the pancreas consistent with chronic pancreatitis.  Patient was started on IV Protonix and EDP reached out to GI for further evaluation.  We will type and screen and transfuse her 2 units of PRBCs.   Currently she is being admitted and treated for the following but not limited to:  Acute blood loss anemia Notable drop in hemoglobin to 7.1, down from 10.5 one month ago initially which then further dropped to 9.7 Patient reports NSAID use in the setting of abdominal pain as well as recent blood in her vomitus over the past 3 days Initiated intravenous Protonix every 12 hours Anemia panel done and showed an iron level of 16, U IBC 365, TIBC of 381, saturation ratios of 4%, ferritin level of 11, folate level 8.4, vitamin B12 level of 219 N.p.o. for now in case of endoscopic intervention Gastroenterology consultation Serial CBCs every 6 hours, transfuse for hemoglobin less than 7 Avoiding any anticoagulants or NSAIDs   Pseudocyst, pancreas Please see assessment and plan above   Acute on chronic pancreatitis [facility]) Patient exhibiting a several day history of recurrent epigastric pain likely due to recurrent acute on chronic pancreatitis This presentation is complicated by evidence of an enlarging pseudocyst on CT imaging Patient was to follow-up with Dr. Rush Landmark in the outpatient setting in July for EUS.   EDP sent secure chat message to gastroenterology to see if they can evaluate her and determine what therapeutic options may be available now. N.p.o. in the meantime Gentle intravenous hydration As needed opiate-based analgesics for associated substantial pain   Essential Hypertension Patient historically has extremely difficult to manage blood pressure and is on numerous antihypertensives That being said, patient's blood pressures are actually quite low on this presentation, possibly secondary to gastrointestinal bleeding Holding antihypertensives for now, will  reintroduce these blood pressure medications as patient can safely tolerate Additionally have ordered as needed intravenous antihypertensive in case of markedly elevated blood pressure   Descending thoracic aortic dissection  (HCC) Ensuring adequate blood pressure management is important considering known history of descending thoracic aortic dissection however her blood pressure medications are being held for now given GI bleeding Patient has been undergoing surveillance since 2021 and has been following with Dr. Stanford Breed with vascular surgery   GERD without esophagitis Intravenous PPI as noted above   History of alcohol abuse Patient reports abstinence  AKI Noted to have an increase from her BUNs/creatinine on 04/24/2022 when it went from 6/0.34 and is now 13/1.15 Getting blood 2 units Also initiated on LR at 100 MLS per hour Avoid further nephrotoxic medications, contrast dyes, hypotension and dehydration to ensure adequate renal perfusion and will need to renally adjust medications Continue monitor and trend renal function carefully  We will continue monitor patient's clinical response to intervention and repeat blood work n the a.m. and follow-up on specialist recommendations

## 2022-06-04 NOTE — Assessment & Plan Note (Signed)
   Patient reports abstinence

## 2022-06-04 NOTE — ED Provider Notes (Signed)
[facility] DEPT Provider Note   CSN: [id number] Arrival date & time: 06/03/22  1503     History  Chief Complaint  Patient presents with   Emesis    Robin Guerrero is a 36 y.o. female.  The history is provided by the patient and medical records.  Emesis Associated symptoms: abdominal pain    36 year old female with history of chronic pancreatitis, chronic kidney disease, hypertension, presenting to the ED with abdominal pain and vomiting.  Patient reports ongoing issue since Friday evening with continuous nausea and vomiting.  States she is now vomiting up some streaks of blood intermixed.  She denies any diarrhea.  She has actually been constipated and has not had a bowel movement as she ran out of her MiraLAX.  Denies any rectal bleeding or blood in recent stools.  She does have history of iron deficiency anemia, has not been taking her iron supplements as it constipates her even further.  She is followed by [facility], cannot get a follow-up until 07/05/22.    Home Medications Prior to Admission medications   Medication Sig Start Date End Date Taking? Authorizing Provider  amLODipine (NORVASC) 10 MG tablet Take 1 tablet (10 mg total) by mouth daily. 03/08/22   Patwardhan, Reynold Bowen, MD  carvedilol (COREG) 25 MG tablet Take 1 tablet (25 mg total) by mouth 2 (two) times daily with a meal. 03/08/22   Patwardhan, Manish J, MD  cloNIDine (CATAPRES) 0.2 MG tablet Take 1 tablet (0.2 mg total) by mouth every 8 (eight) hours. 03/08/22 05/07/22  Patwardhan, Reynold Bowen, MD  ferrous sulfate 325 (65 FE) MG tablet TAKE 1 TABLET BY MOUTH EVERY DAY WITH BREAKFAST Patient taking differently: Take 325 mg by mouth daily. 03/25/22   Patwardhan, Reynold Bowen, MD  hydrALAZINE (APRESOLINE) 100 MG tablet Take 1 tablet (100 mg total) by mouth 3 (three) times daily. 03/08/22 03/08/23  Patwardhan, Reynold Bowen, MD  losartan (COZAAR) 100 MG tablet Take 100 mg by mouth daily. 04/01/22   [provider]  metoCLOPramide (REGLAN) 10 MG tablet Take 1 tablet (10 mg total) by mouth every 6 (six) hours. Patient taking differently: Take 10 mg by mouth in the morning and at bedtime. 04/16/22   Teodora Medici, FNP  Multiple Vitamin (MULTIVITAMIN WITH MINERALS) TABS tablet Take 1 tablet by mouth daily. 11/18/20   Allie Bossier, MD  omeprazole (PRILOSEC) 20 MG capsule Take 1 capsule (20 mg total) by mouth 2 (two) times daily before a meal. 2/45/80   Campbell Stall P, DO  pantoprazole (PROTONIX) 40 MG tablet Take 40 mg by mouth daily. 05/29/22   [provider]      Allergies    Ativan [lorazepam]    Review of Systems   Review of Systems  Gastrointestinal:  Positive for abdominal pain, nausea and vomiting.  All other systems reviewed and are negative.   Physical Exam Updated Vital Signs BP 122/88 (BP Location: Left Arm)   Pulse (!) 108   Temp 98.8 F (37.1 C) (Oral)   Resp 16   Ht '5\' 9"'$  (1.753 m)   Wt 88 kg   SpO2 100%   BMI 28.65 kg/m   Physical Exam Vitals and nursing note reviewed.  Constitutional:      Appearance: She is well-developed.     Comments: Appears unwell but non-toxic  HENT:     Head: Normocephalic and atraumatic.  Eyes:     Conjunctiva/sclera: Conjunctivae normal.     Pupils:  Pupils are equal, round, and reactive to light.  Cardiovascular:     Rate and Rhythm: Normal rate and regular rhythm.     Heart sounds: Normal heart sounds.  Pulmonary:     Effort: Pulmonary effort is normal. No respiratory distress.     Breath sounds: Normal breath sounds. No rhonchi.  Abdominal:     General: Bowel sounds are normal.     Palpations: Abdomen is soft.     Tenderness: There is abdominal tenderness in the epigastric area. There is no guarding.     Hernia: No hernia is present.  Musculoskeletal:        General: Normal range of motion.     Cervical back: Normal range of motion.  Skin:    General: Skin is warm and dry.  Neurological:     Mental Status:  She is alert and oriented to person, place, and time.     ED Results / Procedures / Treatments   Labs (all labs ordered are listed, but only abnormal results are displayed) Labs Reviewed  COMPREHENSIVE METABOLIC PANEL - Abnormal; Notable for the following components:      Result Value   Chloride 112 (*)    CO2 21 (*)    Glucose, Bld 101 (*)    Creatinine, Ser 1.15 (*)    AST 12 (*)    All other components within normal limits  LIPASE, BLOOD - Abnormal; Notable for the following components:   Lipase 75 (*)    All other components within normal limits  CBC WITH DIFFERENTIAL/PLATELET - Abnormal; Notable for the following components:   RBC 2.58 (*)    Hemoglobin 7.1 (*)    HCT 23.7 (*)    RDW 15.9 (*)    nRBC 0.9 (*)    Abs Immature Granulocytes 0.09 (*)    All other components within normal limits  URINALYSIS, ROUTINE W REFLEX MICROSCOPIC - Abnormal; Notable for the following components:   APPearance HAZY (*)    All other components within normal limits  RETICULOCYTES - Abnormal; Notable for the following components:   Retic Ct Pct 5.0 (*)    RBC. 2.36 (*)    Immature Retic Fract 31.2 (*)    All other components within normal limits  VITAMIN B12  FOLATE  IRON AND TIBC  FERRITIN  I-STAT BETA HCG BLOOD, ED ([facility], [facility], [facility] ONLY)  TYPE AND SCREEN    EKG None  Radiology CT ABDOMEN PELVIS W CONTRAST  Result Date: 06/03/2022 CLINICAL DATA:  Abdominal pain. History of chronic aortic dissection. History of acute pancreatitis. EXAM: CT ABDOMEN AND PELVIS WITH CONTRAST TECHNIQUE: Multidetector CT imaging of the abdomen and pelvis was performed using the standard protocol following bolus administration of intravenous contrast. RADIATION DOSE REDUCTION: This exam was performed according to the departmental dose-optimization program which includes automated exposure control, adjustment of the mA and/or kV according to patient size and/or use of iterative reconstruction technique.  CONTRAST:  165m OMNIPAQUE IOHEXOL 300 MG/ML  SOLN COMPARISON:  None Available. FINDINGS: Lower chest: Band of atelectasis of LEFT lung base. No pleural fluid. Hepatobiliary: Thin walled fluid collection in the porta hepatis measures 7.1 by 5.1 cm (image 21/2) increased in volume from 5.1 by 3.3 cm. There is narrowing of the proximal portal vein related to inflammation in the porta hepatis (image 28/2). This finding is slightly improved from comparison exam. LEFT and RIGHT portal veins are patent. No focal hepatic lesion. Gallbladder normal. Pancreas: There is enlargement of the pancreatic head  by ill-defined cystic collections measuring 3.7 x 3.6 cm (image 33/2) compared to 3.4 by 3.9 cm. Minimal change visually. There is mild cystic change in the tail the pancreas measuring 3.5 by 2.6 cm slightly improved from comparison exam. The splenic vein is patent. Spleen: Spleen is normal. Splenic vein is patent. There are venous collaterals in the gastrosplenic ligament. Adrenals/urinary tract: Adrenal glands and kidneys are normal. The ureters and bladder normal. Stomach/Bowel: Stomach, small bowel, appendix, and cecum are normal. The colon and rectosigmoid colon are normal. Vascular/Lymphatic: chronic abdominal aortic dissection again noted. The celiac trunk and SMA are opacified. IMA opacified. Renal arteries opacified. Reproductive: Uterus and adnexa unremarkable. Other: No free fluid. Musculoskeletal: No aggressive osseous lesion. IMPRESSION: 1. Interval enlargement well-circumscribed fluid collection in the porta hepatis. Findings consistent with enlarging pseudocysts. 2. Narrowing of the main portal vein related to pancreatitis is improved from comparison exam. 3. Again demonstrated ill-defined cystic enlargement in the head of pancreas and tail the pancreas consistent with chronic pancreatitis. Minimal change from comparison exam. Some improvement the tail. 4. Spleen is normal. Venous collaterals in the  gastrohepatic ligament. 5. State stable chronic dissection of the abdominal aorta. Electronically Signed   By: Suzy Bouchard M.D.   On: 06/03/2022 18:57    Procedures Procedures    CRITICAL CARE Performed by: Larene Pickett   Total critical care time: 45 minutes  Critical care time was exclusive of separately billable procedures and treating other patients.  Critical care was necessary to treat or prevent imminent or life-threatening deterioration.  Critical care was time spent personally by me on the following activities: development of treatment plan with patient and/or surrogate as well as nursing, discussions with consultants, evaluation of patient's response to treatment, examination of patient, obtaining history from patient or surrogate, ordering and performing treatments and interventions, ordering and review of laboratory studies, ordering and review of radiographic studies, pulse oximetry and re-evaluation of patient's condition.   Medications Ordered in ED Medications  iohexol (OMNIPAQUE) 300 MG/ML solution 100 mL (100 mLs Intravenous Contrast Given 06/03/22 1827)  sodium chloride 0.9 % bolus 1,000 mL (1,000 mLs Intravenous New Bag/Given 06/04/22 0308)  ondansetron (ZOFRAN) injection 4 mg (4 mg Intravenous Given 06/04/22 0311)  HYDROmorphone (DILAUDID) injection 1 mg (1 mg Intravenous Given 06/04/22 0317)  pantoprazole (PROTONIX) injection 40 mg (40 mg Intravenous Given 06/04/22 0309)    ED Course/ Medical Decision Making/ A&P                           Medical Decision Making Amount and/or Complexity of Data Reviewed Labs: ordered. Radiology: ordered and independent interpretation performed. ECG/medicine tests: ordered and independent interpretation performed.  Risk Prescription drug management. Decision regarding hospitalization.   36 year old female presenting to the ED with worsening abdominal pain, nausea, and vomiting.  Has history of chronic pancreatitis,  followed by [facility].  States symptoms uncontrolled over the weekend, now starting to vomit up some streaks of blood.  She denies any blood in the stool.  Does admit to taking large amounts of Motrin recently to try to control her pain.  Unfortunately, prolonged wait of 11+ hours prior to my evaluation.  She does appear uncomfortable on exam, tenderness throughout epigastrium.  Labs reviewed, no leukocytosis but hemoglobin is 7.1 (8.6 on 04/23/22).  Admits to not taking her home Fe+ and did have some bloody streaked emesis PTA so this may be multifactorial.  Admits to heavy motrin use  recently for pain control which may be contributing to bloody emesis. Denies bloody stools.  Prior labs from March with Fe+ deficiency anemia.  Will repeat today and send type and screen.  Serum creatinine is mildly elevated compared with prior, likely component of dehydration.  Lipase is 75.  She did have repeat CT scan today which reveals enlarging pseudocysts, measuring 7.1 x 5.1 today (previously 5.3 x 3.8 05/17/22).  Given IVF, dilaudid, zofran, and dose of protonix.  She will require admission and likely AM GI consultation.  Discussed with hospitalist, Dr. Cyd Silence-- will admit for ongoing care.  Aware of pending anemia panel.  Secure message left for on call GI for AM consultation.  0443-- anemia panel is worsening Fe+ studies from prior.  Suspect likely root cause of her anemia.  Dr. Cyd Silence made aware of results.  Final Clinical Impression(s) / ED Diagnoses Final diagnoses:  Pancreatic pseudocyst  Nausea and vomiting, unspecified vomiting type  Iron deficiency anemia, unspecified iron deficiency anemia type    Rx / DC Orders ED Discharge Orders     None         Larene Pickett, PA-C 06/04/22 2952    Merryl Hacker, MD 06/04/22 [id number]

## 2022-06-04 NOTE — Assessment & Plan Note (Addendum)
   Ensuring adequate blood pressure management is important considering known history of descending thoracic aortic dissection  Patient has been undergoing surveillance since 2021 and has been following with Dr. Stanford Breed with vascular surgery

## 2022-06-04 NOTE — Assessment & Plan Note (Addendum)
   Notable drop in hemoglobin to 7.1, down from 10.5 one month ago  Patient reports using Motrin at least 3 times daily to treat her abdominal pain for at least the past month.  Concern for NSAID induced gastropathy/GI bleed.  Patient now complaining of exacerbation of abdominal pain as well as recent blood in her vomitus over the past 3 days  Initiated intravenous Protonix every 12 hours   N.p.o. for now in case of endoscopic intervention  Gastroenterology consultation  Serial CBCs every 6 hours, will transfuse if hemoglobin drops below 7   Avoid any anticoagulant use.

## 2022-06-04 NOTE — Assessment & Plan Note (Addendum)
   Patient exhibiting a several day history of recurrent epigastric pain likely due to recurrent acute on chronic pancreatitis  This presentation is complicated by evidence of an enlarging pseudocyst on CT imaging  Patient was to follow-up with Dr. Rush Landmark in the outpatient setting in July for EUS.    Per my discussion with on-call [facility] provider since patient has yet to follow-up with Dr. Rush Landmark will consult [facility], their assistance is greatly appreciated.  N.p.o. in the meantime  Gentle intravenous hydration  As needed opiate-based analgesics for associated substantial pain

## 2022-06-04 NOTE — Assessment & Plan Note (Signed)
   Patient historically has extremely difficult to manage blood pressure and is on numerous antihypertensives  That being said, patient's blood pressures are actually quite low on this presentation, possibly secondary to gastrointestinal bleeding  Holding antihypertensives for now, will reintroduce these blood pressure medications as patient can safely tolerate  Additionally have ordered as needed intravenous antihypertensive in case of markedly elevated blood pressure

## 2022-06-04 NOTE — H&P (Signed)
History and Physical    Patient: Robin Guerrero MRN: [id number] DOA: 06/03/2022  Date of Service: the patient was seen and examined on 06/04/2022  Patient coming from: Home  Chief Complaint:  Chief Complaint  Patient presents with   Emesis    HPI:   36 year old female with past medical history of hypertension, alcohol abuse, iron deficiency anemia, type B aortic dissection (Dx 11/2000, follows with Dr. Stanford Breed), hypertension, gastroesophageal reflux disease and multiple episodes for acute on chronic pancreatitis in the past, recently complicated by pseudocyst formation (04/2022) who presents to [facility] emergency department with complaints of abdominal pain nausea and vomiting.  Patient explains that she began to develop abdominal pain on Friday.  Abdominal pain is epigastric in location, sharp in quality, severe in intensity and wraps around to the back.  This has been associated bouts of intense nausea and vomiting.  Patient symptoms persisted over the next several days and became associated with small amounts of blood mixed in her vomitus.  Patient additionally reports having dark bowel movements in the past several days to weeks.  Patient denies any fevers, recent ingestion of undercooked food, ongoing alcohol use, sick contacts, recent travel.  It is worth noting the patient typically uses Motrin to manage her chronic abdominal pain from her chronic pancreatitis taking Motrin at least 3 times daily for at least the past month.  Patient reports that her next appointment with Dr. Rush Landmark is 7/21.  Due to progressively worsening nausea vomiting and pain patient eventually presented to [facility] emergency department for evaluation.  Upon evaluation in the emergency department CT imaging of the abdomen and pelvis was performed revealing interval enlargement of the fluid collections in the porta hepatis consistent with enlarging pseudocyst with narrowing of the  main portal vein and cystic enlargement the head of the pancreas consistent with chronic pancreatitis.  Patient's hemoglobin was noted to have dropped to 7.1, down from 10.5 on 5/8.  Patient was initiated on intravenous Protonix.  EDP sent a secure chat to [facility] gastroenterology for request of nonurgent consultation.  The hospitalist group was then called to assess the patient for admission to the hospital.  Review of Systems: Review of Systems  Gastrointestinal:  Positive for abdominal pain, nausea and vomiting.     Past Medical History:  Diagnosis Date   Descending thoracic dissection ([place])    History of anemia    no current med.   Hypertension    states under control with med., has been on med. x 3 yr.   Pancreatitis     Past Surgical History:  Procedure Laterality Date   FOREIGN BODY REMOVAL Left 02/10/2015   Procedure: ATTEMPTED EXPLANTATION OF BIRTH CONTROL DEVICE LEFT ARM;  Surgeon: Johnathan Hausen, MD;  Location: [place];  Service: General;  Laterality: Left;   FOREIGN BODY REMOVAL Left 05/12/2015   Procedure: REMOVAL OF LEFT ARM BIRTH CONTROL DEVICE;  Surgeon: Johnathan Hausen, MD;  Location: [place];  Service: General;  Laterality: Left;   SUBMANDIBULAR GLAND EXCISION Right 05/01/2009    Social History:  reports that she has been smoking cigarettes. She has a 1.25 pack-year smoking history. She has never used smokeless tobacco. She reports current alcohol use. She reports that she does not use drugs.  Allergies  Allergen Reactions   Ativan [Lorazepam] Other (See Comments)    Disorientated, combative    Family History  Problem Relation Age of Onset   Breast cancer Mother 7  Heart disease Mother    Hypertension Mother     Prior to Admission medications   Medication Sig Start Date End Date Taking? Authorizing Provider  amLODipine (NORVASC) 10 MG tablet Take 1 tablet (10 mg total) by mouth daily. 03/08/22  Yes Patwardhan, Reynold Bowen, MD   carvedilol (COREG) 25 MG tablet Take 1 tablet (25 mg total) by mouth 2 (two) times daily with a meal. 03/08/22  Yes Patwardhan, Manish J, MD  cloNIDine (CATAPRES) 0.2 MG tablet Take 1 tablet (0.2 mg total) by mouth every 8 (eight) hours. 03/08/22 06/04/22 Yes Patwardhan, Manish J, MD  ferrous sulfate 325 (65 FE) MG tablet TAKE 1 TABLET BY MOUTH EVERY DAY WITH BREAKFAST Patient taking differently: Take 325 mg by mouth daily. 03/25/22  Yes Patwardhan, Manish J, MD  hydrALAZINE (APRESOLINE) 100 MG tablet Take 1 tablet (100 mg total) by mouth 3 (three) times daily. 03/08/22 03/08/23 Yes Patwardhan, Manish J, MD  losartan (COZAAR) 100 MG tablet Take 100 mg by mouth daily. 04/01/22  Yes [provider]  pantoprazole (PROTONIX) 40 MG tablet Take 40 mg by mouth daily. 05/29/22  Yes [provider]  metoCLOPramide (REGLAN) 10 MG tablet Take 1 tablet (10 mg total) by mouth every 6 (six) hours. Patient not taking: Reported on 06/04/2022 04/16/22   Teodora Medici, FNP  Multiple Vitamin (MULTIVITAMIN WITH MINERALS) TABS tablet Take 1 tablet by mouth daily. Patient not taking: Reported on 06/04/2022 11/18/20   Allie Bossier, MD  omeprazole (PRILOSEC) 20 MG capsule Take 1 capsule (20 mg total) by mouth 2 (two) times daily before a meal. Patient not taking: Reported on 06/04/2022 9/37/90   Lianne Cure, DO    Physical Exam:  Vitals:   06/04/22 0645 06/04/22 0715 06/04/22 0730 06/04/22 0815  BP: 100/71 108/80 100/72 107/77  Pulse: 84 87 90 84  Resp: '14 16 13 20  '$ Temp:      TempSrc:      SpO2: 97% 100% 96% 96%  Weight:      Height:        Constitutional: Awake alert and oriented x3, no associated distress.   Skin: no rashes, no lesions, good skin turgor noted. Eyes: Pupils are equally reactive to light.  No evidence of scleral icterus or conjunctival pallor.  ENMT: Moist mucous membranes noted.  Posterior pharynx clear of any exudate or lesions.   Neck: normal, supple, no masses, no  thyromegaly.  No evidence of jugular venous distension.   Respiratory: clear to auscultation bilaterally, no wheezing, no crackles. Normal respiratory effort. No accessory muscle use.  Cardiovascular: Regular rate and rhythm, no murmurs / rubs / gallops. No extremity edema. 2+ pedal pulses. No carotid bruits.  Chest:   Nontender without crepitus or deformity.   Back:   Nontender without crepitus or deformity. Abdomen: Abdomen is soft and nontender.  No evidence of intra-abdominal masses.  Positive bowel sounds noted in all quadrants.   Musculoskeletal: No joint deformity upper and lower extremities. Good ROM, no contractures. Normal muscle tone.  Neurologic: CN 2-12 grossly intact. Sensation intact.  Patient moving all 4 extremities spontaneously.  Patient is following all commands.  Patient is responsive to verbal stimuli.   Psychiatric: Patient exhibits normal mood with appropriate affect.  Patient seems to possess insight as to their current situation.   e Data Reviewed:  I have personally reviewed and interpreted labs, imaging.  Significant findings are:  Lab Results  Component Value Date   WBC 7.6 06/04/2022  HGB 5.7 (LL) 06/04/2022   HCT 19.4 (L) 06/04/2022   MCV 93.7 06/04/2022   PLT 245 06/04/2022   CT imaging of the abdomen and pelvis revealing interval enlargement of well circumscribed fluid collection in the porta hepatis.  Findings consistent with enlarging pseudocysts.  Narrowing of the main portal vein related to pancreatitis improved from the comparison exam.  Demonstrated ill-defined cystic enlargement the head of the pancreas and tail the pancreas consistent with chronic pancreatitis.   Assessment and Plan: * Acute blood loss anemia Notable drop in hemoglobin to 7.1, down from 10.5 one month ago Patient reports using Motrin at least 3 times daily to treat her abdominal pain for at least the past month.  Concern for NSAID induced gastropathy/GI bleed. Patient now  complaining of exacerbation of abdominal pain as well as recent blood in her vomitus over the past 3 days Initiated intravenous Protonix every 12 hours  N.p.o. for now in case of endoscopic intervention Gastroenterology consultation Serial CBCs every 6 hours, will transfuse if hemoglobin drops below 7  Avoid any anticoagulant use.    Pseudocyst, pancreas Please see assessment and plan above  Acute on chronic pancreatitis [facility]) Patient exhibiting a several day history of recurrent epigastric pain likely due to recurrent acute on chronic pancreatitis This presentation is complicated by evidence of an enlarging pseudocyst on CT imaging Patient was to follow-up with Dr. Rush Landmark in the outpatient setting in July for EUS.   Per my discussion with on-call [place] provider since patient has yet to follow-up with Dr. Rush Landmark will consult [facility], their assistance is greatly appreciated. N.p.o. in the meantime Gentle intravenous hydration As needed opiate-based analgesics for associated substantial pain  Essential hypertension Patient historically has extremely difficult to manage blood pressure and is on numerous antihypertensives That being said, patient's blood pressures are actually quite low on this presentation, possibly secondary to gastrointestinal bleeding Holding antihypertensives for now, will reintroduce these blood pressure medications as patient can safely tolerate Additionally have ordered as needed intravenous antihypertensive in case of markedly elevated blood pressure  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HCC) Ensuring adequate blood pressure management is important considering known history of descending thoracic aortic dissection Patient has been undergoing surveillance since 2021 and has been following with Dr. Stanford Breed with vascular surgery  GERD without esophagitis Intravenous PPI as noted above  History of alcohol abuse Patient reports abstinence       Code  Status:  Full code  code status decision has been confirmed with: patient Family Communication: deferred   Consults: [facility] , Dr. Paulita Fujita   Severity of Illness:  The appropriate patient status for this patient is OBSERVATION. Observation status is judged to be reasonable and necessary in order to provide the required intensity of service to ensure the patient's safety. The patient's presenting symptoms, physical exam findings, and initial radiographic and laboratory data in the context of their medical condition is felt to place them at decreased risk for further clinical deterioration. Furthermore, it is anticipated that the patient will be medically stable for discharge from the hospital within 2 midnights of admission.   Author:  Vernelle Emerald MD  06/04/2022 9:49 AM

## 2022-06-04 NOTE — ED Notes (Signed)
Blood bank has blood ready for this patient. Notified I'Li, RN.

## 2022-06-04 NOTE — Consult Note (Signed)
Referring Provider: [facility] Primary Care Physician:  Trey Sailors, PA Primary Gastroenterologist:  Dr. Therisa Doyne  Reason for Consultation:  Anemia, chronic pancreatitis with pseudocyst   HPI: Robin Guerrero is a 36 y.o. female medical history significant for hypertension, alcohol abuse, iron deficiency anemia, type B aortic dissection diagnosed in 2001 and follows with Dr. Luan Pulling, GERD, chronic pancreatitis with pseudocyst formation in May 2023, presents for evaluation of abdominal pain, nausea, vomiting.  Patient reported epigastric pain that wraps around her back, sharp, worse since 6/16.  Patient reports consistent epigastric pain ongoing for the last few years.  Patient also had several episodes of nausea and vomiting, some with small bouts of blood mixed in her vomit.  Patient states she has had about 2 episodes of pancreatitis in the past.  States she has not had alcohol since January 2023.  Reports 2 packs of cigarettes per week.  States she takes daily NSAIDs for pancreatic pain.  Takes multiple pills 2-3 times per day.  She is also on iron supplementation in reports dark stools, though stools are formed.  Denies weight loss.  CT showed interval enlargement of fluid collection in the porta hepatis consistent with enlarging pseudocyst with narrowing of the main portal vein and cystic enlargement of the head of the pancreas consistent with chronic pancreatitis  No previous history of EGD/colonoscopy  Past Medical History:  Diagnosis Date   Descending thoracic dissection ([place])    History of anemia    no current med.   Hypertension    states under control with med., has been on med. x 3 yr.   Pancreatitis     Past Surgical History:  Procedure Laterality Date   FOREIGN BODY REMOVAL Left 02/10/2015   Procedure: ATTEMPTED EXPLANTATION OF BIRTH CONTROL DEVICE LEFT ARM;  Surgeon: Johnathan Hausen, MD;  Location: [place];  Service: General;  Laterality: Left;   FOREIGN BODY  REMOVAL Left 05/12/2015   Procedure: REMOVAL OF LEFT ARM BIRTH CONTROL DEVICE;  Surgeon: Johnathan Hausen, MD;  Location: [place];  Service: General;  Laterality: Left;   SUBMANDIBULAR GLAND EXCISION Right 05/01/2009    Prior to Admission medications   Medication Sig Start Date End Date Taking? Authorizing Provider  amLODipine (NORVASC) 10 MG tablet Take 1 tablet (10 mg total) by mouth daily. 03/08/22  Yes Patwardhan, Reynold Bowen, MD  carvedilol (COREG) 25 MG tablet Take 1 tablet (25 mg total) by mouth 2 (two) times daily with a meal. 03/08/22  Yes Patwardhan, Manish J, MD  cloNIDine (CATAPRES) 0.2 MG tablet Take 1 tablet (0.2 mg total) by mouth every 8 (eight) hours. 03/08/22 06/04/22 Yes Patwardhan, Manish J, MD  ferrous sulfate 325 (65 FE) MG tablet TAKE 1 TABLET BY MOUTH EVERY DAY WITH BREAKFAST Patient taking differently: Take 325 mg by mouth daily. 03/25/22  Yes Patwardhan, Manish J, MD  hydrALAZINE (APRESOLINE) 100 MG tablet Take 1 tablet (100 mg total) by mouth 3 (three) times daily. 03/08/22 03/08/23 Yes Patwardhan, Manish J, MD  losartan (COZAAR) 100 MG tablet Take 100 mg by mouth daily. 04/01/22  Yes [provider]  pantoprazole (PROTONIX) 40 MG tablet Take 40 mg by mouth daily. 05/29/22  Yes [provider]  metoCLOPramide (REGLAN) 10 MG tablet Take 1 tablet (10 mg total) by mouth every 6 (six) hours. Patient not taking: Reported on 06/04/2022 04/16/22   Teodora Medici, FNP  Multiple Vitamin (MULTIVITAMIN WITH MINERALS) TABS tablet Take 1 tablet by mouth daily. Patient not taking:  Reported on 06/04/2022 11/18/20   Allie Bossier, MD  omeprazole (PRILOSEC) 20 MG capsule Take 1 capsule (20 mg total) by mouth 2 (two) times daily before a meal. Patient not taking: Reported on 06/04/2022 0/48/88   Campbell Stall P, DO    Scheduled Meds:  sodium chloride   Intravenous Once   pantoprazole (PROTONIX) IV  40 mg Intravenous Q12H   Continuous Infusions:  lactated ringers  100 mL/hr at 06/04/22 0744   PRN Meds:.acetaminophen **OR** acetaminophen, hydrALAZINE, polyethylene glycol, prochlorperazine  Allergies as of 06/03/2022 - Review Complete 06/03/2022  Allergen Reaction Noted   Ativan [lorazepam] Other (See Comments) 11/13/2020    Family History  Problem Relation Age of Onset   Breast cancer Mother 92   Heart disease Mother    Hypertension Mother     Social History   Socioeconomic History   Marital status: Single    Spouse name: Not on file   Number of children: 2   Years of education: Not on file   Highest education level: Not on file  Occupational History   Not on file  Tobacco Use   Smoking status: Every Day    Packs/day: 0.25    Years: 5.00    Total pack years: 1.25    Types: Cigarettes   Smokeless tobacco: Never   Tobacco comments:    1 pack/week  Vaping Use   Vaping Use: Never used  Substance and Sexual Activity   Alcohol use: Yes    Comment: occasionally   Drug use: No   Sexual activity: Not Currently  Other Topics Concern   Not on file  Social History Narrative   Not on file   Social Determinants of Health   Financial Resource Strain: Not on file  Food Insecurity: Not on file  Transportation Needs: Not on file  Physical Activity: Not on file  Stress: Not on file  Social Connections: Not on file  Intimate Partner Violence: Not on file    Review of Systems: Review of Systems  Constitutional:  Negative for chills, fever and weight loss.  HENT:  Negative for hearing loss and tinnitus.   Eyes:  Negative for blurred vision and double vision.  Respiratory:  Negative for cough and hemoptysis.   Cardiovascular:  Negative for chest pain and palpitations.  Gastrointestinal:  Positive for abdominal pain, nausea and vomiting. Negative for blood in stool, constipation, diarrhea, heartburn and melena.  Genitourinary:  Negative for dysuria and urgency.  Musculoskeletal:  Negative for myalgias and neck pain.  Skin:  Negative  for itching and rash.  Neurological:  Negative for dizziness and headaches.  Psychiatric/Behavioral:  Negative for depression and suicidal ideas.      Physical Exam:Physical Exam Constitutional:      Appearance: She is obese.  HENT:     Head: Normocephalic and atraumatic.     Nose: Nose normal. No congestion.     Mouth/Throat:     Mouth: Mucous membranes are moist.     Pharynx: Oropharynx is clear.  Eyes:     Extraocular Movements: Extraocular movements intact.     Comments: Conjunctival pallor  Cardiovascular:     Rate and Rhythm: Normal rate and regular rhythm.  Pulmonary:     Effort: Pulmonary effort is normal. No respiratory distress.     Breath sounds: Normal breath sounds.  Abdominal:     General: Bowel sounds are normal. There is no distension.     Palpations: Abdomen is soft. There is no mass.  Tenderness: There is abdominal tenderness (Epigastric). There is no guarding or rebound.     Hernia: No hernia is present.  Musculoskeletal:        General: No swelling. Normal range of motion.     Cervical back: Normal range of motion and neck supple.  Skin:    General: Skin is warm and dry.     Coloration: Skin is not jaundiced.  Neurological:     General: No focal deficit present.     Mental Status: She is alert and oriented to person, place, and time.  Psychiatric:        Mood and Affect: Mood normal.        Behavior: Behavior normal.        Thought Content: Thought content normal.        Judgment: Judgment normal.     Vital signs: Vitals:   06/04/22 0730 06/04/22 0815  BP: 100/72 107/77  Pulse: 90 84  Resp: 13 20  Temp:    SpO2: 96% 96%        GI:  Lab Results: Recent Labs    06/03/22 1615 06/04/22 0740  WBC 10.3 7.6  HGB 7.1* 5.7*  HCT 23.7* 19.4*  PLT 346 245   BMET Recent Labs    06/03/22 1615  NA 140  K 3.7  CL 112*  CO2 21*  GLUCOSE 101*  BUN 13  CREATININE 1.15*  CALCIUM 9.0   LFT Recent Labs    06/03/22 1615  PROT 8.1   ALBUMIN 3.7  AST 12*  ALT 9  ALKPHOS 55  BILITOT 0.9   PT/INR Recent Labs    06/04/22 0740  LABPROT 14.3  INR 1.1     Studies/Results: CT ABDOMEN PELVIS W CONTRAST  Result Date: 06/03/2022 CLINICAL DATA:  Abdominal pain. History of chronic aortic dissection. History of acute pancreatitis. EXAM: CT ABDOMEN AND PELVIS WITH CONTRAST TECHNIQUE: Multidetector CT imaging of the abdomen and pelvis was performed using the standard protocol following bolus administration of intravenous contrast. RADIATION DOSE REDUCTION: This exam was performed according to the departmental dose-optimization program which includes automated exposure control, adjustment of the mA and/or kV according to patient size and/or use of iterative reconstruction technique. CONTRAST:  181m OMNIPAQUE IOHEXOL 300 MG/ML  SOLN COMPARISON:  None Available. FINDINGS: Lower chest: Band of atelectasis of LEFT lung base. No pleural fluid. Hepatobiliary: Thin walled fluid collection in the porta hepatis measures 7.1 by 5.1 cm (image 21/2) increased in volume from 5.1 by 3.3 cm. There is narrowing of the proximal portal vein related to inflammation in the porta hepatis (image 28/2). This finding is slightly improved from comparison exam. LEFT and RIGHT portal veins are patent. No focal hepatic lesion. Gallbladder normal. Pancreas: There is enlargement of the pancreatic head by ill-defined cystic collections measuring 3.7 x 3.6 cm (image 33/2) compared to 3.4 by 3.9 cm. Minimal change visually. There is mild cystic change in the tail the pancreas measuring 3.5 by 2.6 cm slightly improved from comparison exam. The splenic vein is patent. Spleen: Spleen is normal. Splenic vein is patent. There are venous collaterals in the gastrosplenic ligament. Adrenals/urinary tract: Adrenal glands and kidneys are normal. The ureters and bladder normal. Stomach/Bowel: Stomach, small bowel, appendix, and cecum are normal. The colon and rectosigmoid colon are  normal. Vascular/Lymphatic: chronic abdominal aortic dissection again noted. The celiac trunk and SMA are opacified. IMA opacified. Renal arteries opacified. Reproductive: Uterus and adnexa unremarkable. Other: No free fluid. Musculoskeletal: No aggressive osseous  lesion. IMPRESSION: 1. Interval enlargement well-circumscribed fluid collection in the porta hepatis. Findings consistent with enlarging pseudocysts. 2. Narrowing of the main portal vein related to pancreatitis is improved from comparison exam. 3. Again demonstrated ill-defined cystic enlargement in the head of pancreas and tail the pancreas consistent with chronic pancreatitis. Minimal change from comparison exam. Some improvement the tail. 4. Spleen is normal. Venous collaterals in the gastrohepatic ligament. 5. State stable chronic dissection of the abdominal aorta. Electronically Signed   By: Suzy Bouchard M.D.   On: 06/03/2022 18:57    Impression: Anemia -Iron 16, ferritin 11 -Hgb 5.7 (baseline appears 9-10) -BUN 13 creatinine 1.15  Chronic pancreatitis with enlarging pseudocyst -Normal LFTs -CT abdomen pelvis with contrast 6/19: Enlargement of well-circumscribed fluid collection in the porta hepatis, consistent with enlarging pseudocyst, narrowing of the main portal vein -Lipase 75   Plan: History of chronic pancreatitis with enlarging pseudocyst and extensive history of NSAID use.  Patient would benefit from EUS for further evaluation.  Will discuss timing with Dr. Paulita Fujita. Acute on chronic anemia, likely multifactorial, Continue daily CBC and transfuse as needed to maintain HGB > 7.  Patient could benefit from EGD for further evaluation of IDA whether that be outpatient versus inpatient.  Dark stools possibly from iron supplementation with having formed stools and normal BUN less likely to be upper GI bleed Continue supportive care, antiemetics as needed [facility] will follow    LOS: 0 days   Garnette Scheuermann   PA-C 06/04/2022, 9:42 AM  Contact #  [phone]

## 2022-06-04 NOTE — ED Notes (Signed)
Pt care taken no complaints at this time, fluids running

## 2022-06-05 DIAGNOSIS — D62 Acute posthemorrhagic anemia: Secondary | ICD-10-CM | POA: Diagnosis not present

## 2022-06-05 LAB — TYPE AND SCREEN
ABO/RH(D): A POS
Antibody Screen: NEGATIVE
Unit division: 0
Unit division: 0

## 2022-06-05 LAB — COMPREHENSIVE METABOLIC PANEL
ALT: 10 U/L (ref 0–44)
AST: 12 U/L — ABNORMAL LOW (ref 15–41)
Albumin: 3.3 g/dL — ABNORMAL LOW (ref 3.5–5.0)
Alkaline Phosphatase: 51 U/L (ref 38–126)
Anion gap: 8 (ref 5–15)
BUN: 7 mg/dL (ref 6–20)
CO2: 21 mmol/L — ABNORMAL LOW (ref 22–32)
Calcium: 8.7 mg/dL — ABNORMAL LOW (ref 8.9–10.3)
Chloride: 111 mmol/L (ref 98–111)
Creatinine, Ser: 0.71 mg/dL (ref 0.44–1.00)
GFR, Estimated: >60 mL/min (ref >60)
Glucose, Bld: 88 mg/dL (ref 70–99)
Potassium: 3.5 mmol/L (ref 3.5–5.1)
Sodium: 140 mmol/L (ref 135–145)
Total Bilirubin: 1.3 mg/dL — ABNORMAL HIGH (ref 0.3–1.2)
Total Protein: 6.8 g/dL (ref 6.5–8.1)

## 2022-06-05 LAB — BPAM RBC
Blood Product Expiration Date: 202307122359
Blood Product Expiration Date: 202307122359
ISSUE DATE / TIME: 202306201230
ISSUE DATE / TIME: 202306201806
Unit Type and Rh: 6200
Unit Type and Rh: 6200

## 2022-06-05 LAB — MAGNESIUM: Magnesium: 1.9 mg/dL (ref 1.7–2.4)

## 2022-06-05 MED ORDER — CARVEDILOL 25 MG PO TABS
25.0000 mg | ORAL_TABLET | Freq: Two times a day (BID) | ORAL | Status: DC
  Administered 2022-06-05 – 2022-06-08 (×6): 25 mg via ORAL
  Filled 2022-06-05 (×6): qty 1

## 2022-06-05 MED ORDER — LACTATED RINGERS IV SOLN: INTRAVENOUS | Status: AC

## 2022-06-05 MED ORDER — AMLODIPINE BESYLATE 10 MG PO TABS
10.0000 mg | ORAL_TABLET | Freq: Every day | ORAL | Status: DC
  Administered 2022-06-05 – 2022-06-08 (×4): 10 mg via ORAL
  Filled 2022-06-05 (×4): qty 1

## 2022-06-05 MED ORDER — CLONIDINE HCL 0.2 MG PO TABS
0.2000 mg | ORAL_TABLET | Freq: Three times a day (TID) | ORAL | Status: DC
  Administered 2022-06-05 – 2022-06-08 (×8): 0.2 mg via ORAL
  Filled 2022-06-05 (×8): qty 1

## 2022-06-05 MED ORDER — HYDRALAZINE HCL 20 MG/ML IJ SOLN
10.0000 mg | Freq: Four times a day (QID) | INTRAMUSCULAR | Status: DC | PRN
Start: 2022-06-05 — End: 2022-06-08

## 2022-06-05 NOTE — Plan of Care (Signed)
Pt c/o ha and nausea this am.  PRNs administered per MAR.  No c/o at this time.  Problem: Education: Goal: Knowledge of General Education information will improve Description: Including pain rating scale, medication(s)/side effects and non-pharmacologic comfort measures Outcome: Progressing   Problem: Health Behavior/Discharge Planning: Goal: Ability to manage health-related needs will improve Outcome: Progressing   Problem: Clinical Measurements: Goal: Ability to maintain clinical measurements within normal limits will improve Outcome: Progressing Goal: Will remain free from infection Outcome: Progressing Goal: Diagnostic test results will improve Outcome: Progressing Goal: Respiratory complications will improve Outcome: Progressing Goal: Cardiovascular complication will be avoided Outcome: Progressing   Problem: Activity: Goal: Risk for activity intolerance will decrease Outcome: Progressing   Problem: Nutrition: Goal: Adequate nutrition will be maintained Outcome: Progressing   Problem: Coping: Goal: Level of anxiety will decrease Outcome: Progressing   Problem: Elimination: Goal: Will not experience complications related to bowel motility Outcome: Progressing Goal: Will not experience complications related to urinary retention Outcome: Progressing   Problem: Pain Managment: Goal: General experience of comfort will improve Outcome: Progressing   Problem: Safety: Goal: Ability to remain free from injury will improve Outcome: Progressing   Problem: Skin Integrity: Goal: Risk for impaired skin integrity will decrease Outcome: Progressing

## 2022-06-05 NOTE — Progress Notes (Signed)
[facility] Progress Note  Robin Guerrero 36 y.o. 1986-11-09  CC: Anemia, hematemesis   Subjective: Patient states she has had nausea, no vomiting.  No further bleeding.  Abdominal pain has improved.  Tolerating clears without difficulty.  ROS : Review of Systems  Constitutional:  Negative for chills and fever.  Gastrointestinal:  Positive for nausea. Negative for abdominal pain, blood in stool, constipation, diarrhea, heartburn, melena and vomiting.      Objective: Vital signs in last 24 hours: Vitals:   06/04/22 2104 06/05/22 0037  BP: 121/82 133/86  Pulse: 100 96  Resp: 18 16  Temp: 99.1 F (37.3 C) 98.4 F (36.9 C)  SpO2: 95% 95%    Physical Exam:  General:  Alert, cooperative, no distress, appears stated age  Head:  Normocephalic, without obvious abnormality, atraumatic  Eyes:  Anicteric sclera, EOM's intact  Lungs:   Clear to auscultation bilaterally, respirations unlabored  Heart:  Regular rate and rhythm, S1, S2 normal  Abdomen:   Soft, non-tender, bowel sounds active all four quadrants,  no masses,   Extremities: Extremities normal, atraumatic, no  edema  Pulses: 2+ and symmetric    Lab Results: Recent Labs    06/03/22 1615 06/05/22 1120  NA 140 140  K 3.7 3.5  CL 112* 111  CO2 21* 21*  GLUCOSE 101* 88  BUN 13 7  CREATININE 1.15* 0.71  CALCIUM 9.0 8.7*  MG  --  1.9   Recent Labs    06/03/22 1615 06/05/22 1120  AST 12* 12*  ALT 9 10  ALKPHOS 55 51  BILITOT 0.9 1.3*  PROT 8.1 6.8  ALBUMIN 3.7 3.3*   Recent Labs    06/03/22 1615 06/04/22 0740 06/04/22 1703 06/04/22 2314  WBC 10.3   < > 7.2 7.3  NEUTROABS 7.7  --   --   --   HGB 7.1*   < > 7.0* 8.0*  HCT 23.7*   < > 23.2* 25.4*  MCV 91.9   < > 94.7 91.7  PLT 346   < > 211 215   < > = values in this interval not displayed.   Recent Labs    06/04/22 0740  LABPROT 14.3  INR 1.1      Assessment Anemia -Iron 16, ferritin 11 -Hgb 8.0 s/p 2 units PRBCs -Normal  renal function   Chronic pancreatitis with enlarging pseudocyst -Normal LFTs -CT abdomen pelvis with contrast 6/19: Enlargement of well-circumscribed fluid collection in the porta hepatis, consistent with enlarging pseudocyst, narrowing of the main portal vein -Lipase 75   Plan: No further episode of hematemesis.  Hemoglobin stable. Plan for EGD tomorrow. I thoroughly discussed the procedures to include nature, alternatives, benefits, and risks including but not limited to bleeding, perforation, infection, anesthesia/cardiac and pulmonary complications. Patient provides understanding and gave verbal consent to proceed. Continue Protonix IV '40mg'$  BID NPO at midnight Continue anti-emetics and supportive care as needed. [facility] will follow.     Garnette Scheuermann PA-C 06/05/2022, 12:18 PM  Contact #  [phone]

## 2022-06-05 NOTE — Progress Notes (Signed)
PROGRESS NOTE    Robin Guerrero  EPP:[id number] DOB: [birth year] DOA: 06/03/2022 PCP: Trey Sailors, PA    Brief Narrative:  36 year old female with history of essential hypertension, alcohol use, iron deficiency anemia, history of chronic recurrent pancreatitis recently complicated with pseudocyst presented to the ER with abdominal pain, nausea and vomiting, history of hematemesis.  In the emergency room hemodynamically stable.  Hemoglobin was found to be 7.1 from recent hemoglobin of 10.5.  Hemoglobin further dropped to 5.7.  CT scan abdomen pelvis with interval enlargement of fluid collection in the porta hepatis consistent with enlarging pseudocyst and narrowing of the main portal vein.  Started on blood transfusions, IV fluids and admitted to the hospital.   Assessment & Plan:   Acute blood loss anemia with history of chronic iron deficiency anemia: Ongoing use of NSAIDs. Hemoglobin 5.7-2 units of PRBC transfusion, appropriately responded to blood transfusion.  Transfuse for hemoglobin less than 7.  Will benefit with IV iron while in the hospital.  We will transfuse 1 unit of IV iron today.  Recheck hemoglobin tomorrow morning.  Acute on chronic pancreatitis, acute on chronic abdominal pain with increasing size of pseudocyst: Tolerating liquid diet.  Continue.  Adequate pain medications.  IV fluids.  IV Protonix twice daily. Schedule for upper GI endoscopy tomorrow. Lipase and electrolytes are normal.  LFTs are normal.  Essential hypertension: Blood pressure stable now.  Only on as needed blood pressure medications.  Acute kidney injury: Due to above.  Improved.   DVT prophylaxis: SCDs Start: 06/04/22 0659   Code Status: Full code Family Communication: None Disposition Plan: Status is: Inpatient Remains inpatient appropriate because: Acute GI bleeding, inpatient procedure planned     Consultants:  Gastroenterology  Procedures:  None  Antimicrobials:   None   Subjective: Patient was seen and examined.  Has some nausea but denies any vomiting.  Moderate epigastric discomfort present.  Objective: Vitals:   06/04/22 1745 06/04/22 1828 06/04/22 2104 06/05/22 0037  BP: 128/84 121/85 121/82 133/86  Pulse: 89 95 100 96  Resp: '18 18 18 16  '$ Temp: 98.9 F (37.2 C) 99.1 F (37.3 C) 99.1 F (37.3 C) 98.4 F (36.9 C)  TempSrc: Oral Oral Oral Oral  SpO2: 93% 95% 95% 95%  Weight:      Height:        Intake/Output Summary (Last 24 hours) at 06/05/2022 1256 Last data filed at 06/04/2022 2118 Gross per 24 hour  Intake 2304.46 ml  Output --  Net 2304.46 ml   Filed Weights   06/03/22 1526  Weight: 88 kg    Examination:  General exam: Appears calm and comfortable at rest.  Sleepy. Respiratory system: No added sounds. Cardiovascular system: S1 & S2 heard, RRR. No JVD, murmurs, rubs, gallops or clicks. No pedal edema. Gastrointestinal system: Soft.  Mildly tender in the epigastrium.  No rigidity or guarding.  No palpable mass  Central nervous system: Alert and oriented. No focal neurological deficits. Extremities: Symmetric 5 x 5 power. Skin: No rashes, lesions or ulcers Psychiatry: Judgement and insight appear normal. Mood & affect appropriate.     Data Reviewed: I have personally reviewed following labs and imaging studies  CBC: Recent Labs  Lab 06/03/22 1615 06/04/22 0740 06/04/22 1703 06/04/22 2314  WBC 10.3 7.6 7.2 7.3  NEUTROABS 7.7  --   --   --   HGB 7.1* 5.7* 7.0* 8.0*  HCT 23.7* 19.4* 23.2* 25.4*  MCV 91.9 93.7 94.7 91.7  PLT 346 245  211 875   Basic Metabolic Panel: Recent Labs  Lab 06/03/22 1615 06/05/22 1120  NA 140 140  K 3.7 3.5  CL 112* 111  CO2 21* 21*  GLUCOSE 101* 88  BUN 13 7  CREATININE 1.15* 0.71  CALCIUM 9.0 8.7*  MG  --  1.9   GFR: Estimated Creatinine Clearance: 115 mL/min (by C-G formula based on SCr of 0.71 mg/dL). Liver Function Tests: Recent Labs  Lab 06/03/22 1615  06/05/22 1120  AST 12* 12*  ALT 9 10  ALKPHOS 55 51  BILITOT 0.9 1.3*  PROT 8.1 6.8  ALBUMIN 3.7 3.3*   Recent Labs  Lab 06/03/22 1615  LIPASE 75*   No results for input(s): "AMMONIA" in the last 168 hours. Coagulation Profile: Recent Labs  Lab 06/04/22 0740  INR 1.1   Cardiac Enzymes: No results for input(s): "CKTOTAL", "CKMB", "CKMBINDEX", "TROPONINI" in the last 168 hours. BNP (last 3 results) No results for input(s): "PROBNP" in the last 8760 hours. HbA1C: No results for input(s): "HGBA1C" in the last 72 hours. CBG: No results for input(s): "GLUCAP" in the last 168 hours. Lipid Profile: No results for input(s): "CHOL", "HDL", "LDLCALC", "TRIG", "CHOLHDL", "LDLDIRECT" in the last 72 hours. Thyroid Function Tests: No results for input(s): "TSH", "T4TOTAL", "FREET4", "T3FREE", "THYROIDAB" in the last 72 hours. Anemia Panel: Recent Labs    06/04/22 0300  VITAMINB12 219  FOLATE 8.4  FERRITIN 11  TIBC 381  IRON 16*  RETICCTPCT 5.0*   Sepsis Labs: No results for input(s): "PROCALCITON", "LATICACIDVEN" in the last 168 hours.  No results found for this or any previous visit (from the past 240 hour(s)).       Radiology Studies: CT ABDOMEN PELVIS W CONTRAST  Result Date: 06/03/2022 CLINICAL DATA:  Abdominal pain. History of chronic aortic dissection. History of acute pancreatitis. EXAM: CT ABDOMEN AND PELVIS WITH CONTRAST TECHNIQUE: Multidetector CT imaging of the abdomen and pelvis was performed using the standard protocol following bolus administration of intravenous contrast. RADIATION DOSE REDUCTION: This exam was performed according to the departmental dose-optimization program which includes automated exposure control, adjustment of the mA and/or kV according to patient size and/or use of iterative reconstruction technique. CONTRAST:  123m OMNIPAQUE IOHEXOL 300 MG/ML  SOLN COMPARISON:  None Available. FINDINGS: Lower chest: Band of atelectasis of LEFT lung  base. No pleural fluid. Hepatobiliary: Thin walled fluid collection in the porta hepatis measures 7.1 by 5.1 cm (image 21/2) increased in volume from 5.1 by 3.3 cm. There is narrowing of the proximal portal vein related to inflammation in the porta hepatis (image 28/2). This finding is slightly improved from comparison exam. LEFT and RIGHT portal veins are patent. No focal hepatic lesion. Gallbladder normal. Pancreas: There is enlargement of the pancreatic head by ill-defined cystic collections measuring 3.7 x 3.6 cm (image 33/2) compared to 3.4 by 3.9 cm. Minimal change visually. There is mild cystic change in the tail the pancreas measuring 3.5 by 2.6 cm slightly improved from comparison exam. The splenic vein is patent. Spleen: Spleen is normal. Splenic vein is patent. There are venous collaterals in the gastrosplenic ligament. Adrenals/urinary tract: Adrenal glands and kidneys are normal. The ureters and bladder normal. Stomach/Bowel: Stomach, small bowel, appendix, and cecum are normal. The colon and rectosigmoid colon are normal. Vascular/Lymphatic: chronic abdominal aortic dissection again noted. The celiac trunk and SMA are opacified. IMA opacified. Renal arteries opacified. Reproductive: Uterus and adnexa unremarkable. Other: No free fluid. Musculoskeletal: No aggressive osseous lesion. IMPRESSION: 1. Interval  enlargement well-circumscribed fluid collection in the porta hepatis. Findings consistent with enlarging pseudocysts. 2. Narrowing of the main portal vein related to pancreatitis is improved from comparison exam. 3. Again demonstrated ill-defined cystic enlargement in the head of pancreas and tail the pancreas consistent with chronic pancreatitis. Minimal change from comparison exam. Some improvement the tail. 4. Spleen is normal. Venous collaterals in the gastrohepatic ligament. 5. State stable chronic dissection of the abdominal aorta. Electronically Signed   By: Suzy Bouchard M.D.   On:  06/03/2022 18:57        Scheduled Meds:  pantoprazole (PROTONIX) IV  40 mg Intravenous Q12H   Continuous Infusions:  lactated ringers       LOS: 1 day    Time spent: 35 minutes    Barb Merino, MD [facility] Pager [phone]

## 2022-06-05 NOTE — TOC Initial Note (Signed)
Transition of Care (TOC) - Initial/Assessment Note    Patient Details  Name: Robin Guerrero MRN: [id number] Date of Birth: Aug 08, 1986  Transition of Care [facility]) CM/SW Contact:    Dorita Rowlands, Marjie Skiff, RN Phone Number: 06/05/2022, 3:19 PM  Clinical Narrative:                   Expected Discharge Plan: Home/Self Care Barriers to Discharge: Continued Medical Work up   Patient Goals and CMS Choice Patient states their goals for this hospitalization and ongoing recovery are:: Home      Expected Discharge Plan and Services Expected Discharge Plan: Home/Self Care   Discharge Planning Services: CM Consult   Living arrangements for the past 2 months: Apartment                     Prior Living Arrangements/Services Living arrangements for the past 2 months: Apartment   Patient language and need for interpreter reviewed:: Yes        Need for Family Participation in Patient Care: No (Comment) Care giver support system in place?: Yes (comment)   Criminal Activity/Legal Involvement Pertinent to Current Situation/Hospitalization: No - Comment as needed  Activities of Daily Living Home Assistive Devices/Equipment: None ADL Screening (condition at time of admission) Patient's cognitive ability adequate to safely complete daily activities?: Yes Is the patient deaf or have difficulty hearing?: No Does the patient have difficulty seeing, even when wearing glasses/contacts?: No Does the patient have difficulty concentrating, remembering, or making decisions?: No Patient able to express need for assistance with ADLs?: Yes Does the patient have difficulty dressing or bathing?: No Independently performs ADLs?: Yes (appropriate for developmental age) Does the patient have difficulty walking or climbing stairs?: No Weakness of Legs: None Weakness of Arms/Hands: None        Orientation: : Oriented to Self, Oriented to Place, Oriented to  Time, Oriented to Situation Alcohol / Substance  Use: Not Applicable Psych Involvement: No (comment)  Admission diagnosis:  Acute blood loss anemia [D62] Pancreatic pseudocyst [K86.3] Iron deficiency anemia, unspecified iron deficiency anemia type [D50.9] Nausea and vomiting, unspecified vomiting type [R11.2] Patient Active Problem List   Diagnosis Date Noted   Acute blood loss anemia 06/04/2022   GERD without esophagitis 06/04/2022   History of alcohol abuse 06/04/2022   Pseudocyst, pancreas 06/04/2022   Acute pancreatitis 04/22/2022   Aortic dissection ([place]) 02/20/2022   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   ARF (acute renal failure) ([place]) 06/04/2021   Acute on chronic pancreatitis ([place]) 06/04/2021   H/O aortic dissection 01/31/2021   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   Malignant hypertension    Nausea    Back pain    Hypocalcemia 34/19/6222   Acute alcoholic pancreatitis 97/98/9211   Thrombocytopenia ([place]) 08/19/2020   Normocytic anemia 94/17/4081   Alcoholic pancreatitis 44/81/8563   SIRS (systemic inflammatory response syndrome) ([place]) 08/18/2020   Hypokalemia 08/18/2020   Hypomagnesemia 08/18/2020   Prolonged QT interval 08/18/2020   Essential hypertension 08/18/2020   Depression 08/18/2020   Tobacco use 08/18/2020   PCP:  Trey Sailors, PA Pharmacy:   [id number] [address] [place] [address]Phone: [phone]: [phone] [facility] [address]Phone: [phone]: [phone]    Social Determinants of Health (SDOH) Interventions    Readmission Risk Interventions    06/05/2022    3:15 PM  Readmission Risk Prevention  Plan  Transportation Screening Complete  PCP or Specialist Appt within 3-5 Days Complete  HRI or [place] Complete  Social Work Consult for [place] Planning/Counseling Complete  Palliative Care Screening Not Applicable   Medication Review Press photographer) Complete

## 2022-06-05 NOTE — Progress Notes (Signed)
Patient sister in [place] wants the provider to call her and get an update for patient status on her lab results and EGD test results in the morning.

## 2022-06-06 ENCOUNTER — Inpatient Hospital Stay (HOSPITAL_COMMUNITY): Payer: 59

## 2022-06-06 ENCOUNTER — Inpatient Hospital Stay (HOSPITAL_COMMUNITY): Payer: 59 | Admitting: Anesthesiology

## 2022-06-06 ENCOUNTER — Encounter (HOSPITAL_COMMUNITY): Payer: Self-pay | Admitting: Internal Medicine

## 2022-06-06 ENCOUNTER — Encounter (HOSPITAL_COMMUNITY)
Admission: EM | Disposition: A | Discharge status: Home / Self Care | Payer: Self-pay | Source: Home / Self Care | Attending: Internal Medicine

## 2022-06-06 DIAGNOSIS — D62 Acute posthemorrhagic anemia: Secondary | ICD-10-CM | POA: Diagnosis not present

## 2022-06-06 DIAGNOSIS — I864 Gastric varices: Secondary | ICD-10-CM

## 2022-06-06 DIAGNOSIS — K2971 Gastritis, unspecified, with bleeding: Secondary | ICD-10-CM

## 2022-06-06 DIAGNOSIS — I10 Essential (primary) hypertension: Secondary | ICD-10-CM

## 2022-06-06 DIAGNOSIS — F17210 Nicotine dependence, cigarettes, uncomplicated: Secondary | ICD-10-CM

## 2022-06-06 HISTORY — PX: ESOPHAGOGASTRODUODENOSCOPY: SHX5428

## 2022-06-06 LAB — CBC WITH DIFFERENTIAL/PLATELET
Abs Immature Granulocytes: 0.02 10*3/uL (ref 0.00–0.07)
Basophils Absolute: 0 10*3/uL (ref 0.0–0.1)
Basophils Relative: 0 %
Eosinophils Absolute: 0.1 10*3/uL (ref 0.0–0.5)
Eosinophils Relative: 1 %
HCT: 23.9 % — ABNORMAL LOW (ref 36.0–46.0)
Hemoglobin: 7.4 g/dL — ABNORMAL LOW (ref 12.0–15.0)
Immature Granulocytes: 0 %
Lymphocytes Relative: 19 %
Lymphs Abs: 1 10*3/uL (ref 0.7–4.0)
MCH: 28.7 pg (ref 26.0–34.0)
MCHC: 31 g/dL (ref 30.0–36.0)
MCV: 92.6 fL (ref 80.0–100.0)
Monocytes Absolute: 0.5 10*3/uL (ref 0.1–1.0)
Monocytes Relative: 9 %
Neutro Abs: 3.7 10*3/uL (ref 1.7–7.7)
Neutrophils Relative %: 71 %
Platelets: 179 10*3/uL (ref 150–400)
RBC: 2.58 MIL/uL — ABNORMAL LOW (ref 3.87–5.11)
RDW: 15.8 % — ABNORMAL HIGH (ref 11.5–15.5)
WBC: 5.2 10*3/uL (ref 4.0–10.5)
nRBC: 0 % (ref 0.0–0.2)

## 2022-06-06 LAB — COMPREHENSIVE METABOLIC PANEL
ALT: 10 U/L (ref 0–44)
AST: 10 U/L — ABNORMAL LOW (ref 15–41)
Albumin: 2.9 g/dL — ABNORMAL LOW (ref 3.5–5.0)
Alkaline Phosphatase: 46 U/L (ref 38–126)
Anion gap: 5 (ref 5–15)
BUN: 7 mg/dL (ref 6–20)
CO2: 23 mmol/L (ref 22–32)
Calcium: 8.9 mg/dL (ref 8.9–10.3)
Chloride: 114 mmol/L — ABNORMAL HIGH (ref 98–111)
Creatinine, Ser: 0.66 mg/dL (ref 0.44–1.00)
GFR, Estimated: >60 mL/min (ref >60)
Glucose, Bld: 97 mg/dL (ref 70–99)
Potassium: 3.3 mmol/L — ABNORMAL LOW (ref 3.5–5.1)
Sodium: 142 mmol/L (ref 135–145)
Total Bilirubin: 0.8 mg/dL (ref 0.3–1.2)
Total Protein: 6.2 g/dL — ABNORMAL LOW (ref 6.5–8.1)

## 2022-06-06 LAB — MAGNESIUM: Magnesium: 1.9 mg/dL (ref 1.7–2.4)

## 2022-06-06 LAB — PHOSPHORUS: Phosphorus: 3.5 mg/dL (ref 2.5–4.6)

## 2022-06-06 SURGERY — EGD (ESOPHAGOGASTRODUODENOSCOPY)
Anesthesia: Monitor Anesthesia Care

## 2022-06-06 MED ORDER — PROPOFOL 10 MG/ML IV BOLUS
INTRAVENOUS | Status: AC
  Filled 2022-06-06: qty 20

## 2022-06-06 MED ORDER — IOHEXOL 350 MG/ML SOLN
100.0000 mL | Freq: Once | INTRAVENOUS | Status: AC | PRN
  Administered 2022-06-06: 100 mL via INTRAVENOUS

## 2022-06-06 MED ORDER — LACTATED RINGERS IV SOLN: INTRAVENOUS | Status: DC

## 2022-06-06 MED ORDER — PROPOFOL 500 MG/50ML IV EMUL
INTRAVENOUS | Status: AC
  Filled 2022-06-06: qty 50

## 2022-06-06 MED ORDER — PROPOFOL 500 MG/50ML IV EMUL
INTRAVENOUS | Status: DC | PRN
  Administered 2022-06-06: 130 ug/kg/min via INTRAVENOUS

## 2022-06-06 MED ORDER — POTASSIUM CHLORIDE CRYS ER 20 MEQ PO TBCR
20.0000 meq | EXTENDED_RELEASE_TABLET | Freq: Two times a day (BID) | ORAL | Status: DC
  Administered 2022-06-06 – 2022-06-08 (×5): 20 meq via ORAL
  Filled 2022-06-06 (×5): qty 1

## 2022-06-06 MED ORDER — LIDOCAINE 2% (20 MG/ML) 5 ML SYRINGE
INTRAMUSCULAR | Status: DC | PRN
  Administered 2022-06-06: 100 mg via INTRAVENOUS

## 2022-06-06 MED ORDER — SODIUM CHLORIDE 0.9 % IV SOLN: INTRAVENOUS | Status: DC

## 2022-06-06 MED ORDER — PROPOFOL 10 MG/ML IV BOLUS
INTRAVENOUS | Status: DC | PRN
  Administered 2022-06-06: 10 mg via INTRAVENOUS
  Administered 2022-06-06 (×3): 20 mg via INTRAVENOUS
  Administered 2022-06-06: 40 mg via INTRAVENOUS
  Administered 2022-06-06 (×2): 20 mg via INTRAVENOUS
  Administered 2022-06-06: 50 mg via INTRAVENOUS

## 2022-06-06 NOTE — Anesthesia Preprocedure Evaluation (Signed)
Anesthesia Evaluation  Patient identified by MRN, date of birth, ID band Patient awake    Reviewed: Allergy & Precautions, NPO status , Patient's Chart, lab work & pertinent test results, reviewed documented beta blocker date and time   Airway Mallampati: III  TM Distance: >3 FB Neck ROM: Full    Dental  (+) Teeth Intact, Dental Advisory Given   Pulmonary Current Smoker and Patient abstained from smoking.,    Pulmonary exam normal breath sounds clear to auscultation       Cardiovascular hypertension, Pt. on home beta blockers and Pt. on medications + Peripheral Vascular Disease (Descending thoracic aortic dissection )  Normal cardiovascular exam Rhythm:Regular Rate:Normal     Neuro/Psych PSYCHIATRIC DISORDERS Depression negative neurological ROS     GI/Hepatic GERD  Medicated,Chronic recurrent pancreatitis hematemesis   Endo/Other  negative endocrine ROS  Renal/GU negative Renal ROS     Musculoskeletal negative musculoskeletal ROS (+)   Abdominal   Peds  Hematology  (+) Blood dyscrasia, anemia ,   Anesthesia Other Findings Day of surgery medications reviewed with the patient.  Reproductive/Obstetrics                             Anesthesia Physical Anesthesia Plan  ASA: 3  Anesthesia Plan: MAC   Post-op Pain Management: Minimal or no pain anticipated   Induction: Intravenous  PONV Risk Score and Plan: 1 and TIVA and Treatment may vary due to age or medical condition  Airway Management Planned: Nasal Cannula and Natural Airway  Additional Equipment:   Intra-op Plan:   Post-operative Plan:   Informed Consent: I have reviewed the patients History and Physical, chart, labs and discussed the procedure including the risks, benefits and alternatives for the proposed anesthesia with the patient or authorized representative who has indicated his/her understanding and acceptance.      Dental advisory given  Plan Discussed with: CRNA and Anesthesiologist  Anesthesia Plan Comments:         Anesthesia Quick Evaluation

## 2022-06-06 NOTE — Anesthesia Postprocedure Evaluation (Signed)
Anesthesia Post Note  Patient: Robin Guerrero  Procedure(s) Performed: ESOPHAGOGASTRODUODENOSCOPY (EGD)     Patient location during evaluation: Endoscopy Anesthesia Type: MAC Level of consciousness: awake and alert Pain management: pain level controlled Vital Signs Assessment: post-procedure vital signs reviewed and stable Respiratory status: spontaneous breathing, nonlabored ventilation, respiratory function stable and patient connected to nasal cannula oxygen Cardiovascular status: blood pressure returned to baseline and stable Postop Assessment: no apparent nausea or vomiting Anesthetic complications: no   No notable events documented.  Last Vitals:  Vitals:   06/06/22 1445 06/06/22 1500  BP: 116/67 133/76  Pulse: (!) 102 (!) 101  Resp: (!) 22 13  Temp:    SpO2: 95% 95%    Last Pain:  Vitals:   06/06/22 1500  TempSrc:   PainSc: 0-No pain                 Barnet Glasgow

## 2022-06-06 NOTE — Interval H&P Note (Signed)
History and Physical Interval Note:  06/06/2022 2:10 PM  Robin Guerrero  has presented today for surgery, with the diagnosis of hematemesis.  The various methods of treatment have been discussed with the patient and family. After consideration of risks, benefits and other options for treatment, the patient has consented to  Procedure(s): ESOPHAGOGASTRODUODENOSCOPY (EGD) (N/A) as a surgical intervention.  The patient's history has been reviewed, patient examined, no change in status, stable for surgery.  I have reviewed the patient's chart and labs.  Questions were answered to the patient's satisfaction.     Landry Dyke

## 2022-06-06 NOTE — Transfer of Care (Signed)
Immediate Anesthesia Transfer of Care Note  Patient: Robin Guerrero  Procedure(s) Performed: ESOPHAGOGASTRODUODENOSCOPY (EGD)  Patient Location: PACU  Anesthesia Type:MAC  Level of Consciousness: sedated  Airway & Oxygen Therapy: Patient Spontanous Breathing and Patient connected to face mask oxygen  Post-op Assessment: Report given to RN and Post -op Vital signs reviewed and stable  Post vital signs: Reviewed and stable  Last Vitals:  Vitals Value Taken Time  BP    Temp    Pulse 108 06/06/22 1438  Resp 19 06/06/22 1438  SpO2 100 % 06/06/22 1438  Vitals shown include unvalidated device data.  Last Pain:  Vitals:   06/06/22 1334  TempSrc: Temporal  PainSc: 0-No pain         Complications: No notable events documented.

## 2022-06-06 NOTE — Progress Notes (Signed)
PROGRESS NOTE    Robin Guerrero  LOV:[id number] DOB: 09-04-1986 DOA: 06/03/2022 PCP: Trey Sailors, PA    Brief Narrative:  36 year old female with history of essential hypertension, alcohol use, iron deficiency anemia, history of chronic recurrent pancreatitis recently complicated with pseudocyst presented to the ER with abdominal pain, nausea and vomiting, history of hematemesis.  In the emergency room hemodynamically stable.  Hemoglobin was found to be 7.1 from recent hemoglobin of 10.5.  Hemoglobin further dropped to 5.7.  CT scan abdomen pelvis with interval enlargement of fluid collection in the porta hepatis consistent with enlarging pseudocyst and narrowing of the main portal vein.  Started on blood transfusions, IV fluids and admitted to the hospital.   Assessment & Plan:   Acute blood loss anemia with history of chronic iron deficiency anemia: Ongoing use of NSAIDs. Hemoglobin 5.7-2 units of PRBC transfusion, appropriately responded to blood transfusion.  Transfuse for hemoglobin less than 7.  Will benefit with IV iron while in the hospital.  We will transfuse 1 unit of IV iron today.  Recheck hemoglobin tomorrow morning.  Acute on chronic pancreatitis, acute on chronic abdominal pain with increasing size of pseudocyst: Tolerating liquid diet.  Continue.  Adequate pain medications.  IV fluids.  IV Protonix twice daily. Schedule for upper GI endoscopy today. Lipase is borderline elevated.  LFTs are normal. Replace potassium today for hypokalemia.  Essential hypertension: Blood pressure stable now.  Only on as needed blood pressure medications.  Acute kidney injury: Due to above.  Improved.   DVT prophylaxis: SCDs Start: 06/04/22 0659   Code Status: Full code Family Communication: None Disposition Plan: Status is: Inpatient Remains inpatient appropriate because: Acute GI bleeding, inpatient procedure planned     Consultants:  Gastroenterology  Procedures:   None  Antimicrobials:  None   Subjective:  Seen and examined.  Intermittent nausea but was able to eat her dinner last night.  N.p.o. today.  Mild epigastric pain persist.  Passing flatus.  No BM.  Waiting for procedure today.  Objective: Vitals:   06/05/22 1355 06/05/22 1952 06/06/22 0436 06/06/22 0929  BP: (!) 155/116 (!) 128/92 119/72 (!) 142/97  Pulse: (!) 101 (!) 108 89   Resp: '18 18 18   '$ Temp: 98.5 F (36.9 C) 99.2 F (37.3 C) 98.8 F (37.1 C)   TempSrc: Oral Oral Oral   SpO2: 99% 98% 93%   Weight:      Height:        Intake/Output Summary (Last 24 hours) at 06/06/2022 1133 Last data filed at 06/06/2022 0300 Gross per 24 hour  Intake 1237.14 ml  Output --  Net 1237.14 ml   Filed Weights   06/03/22 1526  Weight: 88 kg    Examination:  General exam: Appears comfortable at rest. Respiratory system: No added sounds. Cardiovascular system: S1 & S2 heard, RRR. No JVD, murmurs, rubs, gallops or clicks. No pedal edema. Gastrointestinal system: Soft.  Mildly tender in the epigastrium.  No rigidity or guarding.  No palpable mass  Central nervous system: Alert and oriented. No focal neurological deficits. Extremities: Symmetric 5 x 5 power. Skin: No rashes, lesions or ulcers Psychiatry: Flat affect.    Data Reviewed: I have personally reviewed following labs and imaging studies  CBC: Recent Labs  Lab 06/03/22 1615 06/04/22 0740 06/04/22 1703 06/04/22 2314 06/06/22 0602  WBC 10.3 7.6 7.2 7.3 5.2  NEUTROABS 7.7  --   --   --  3.7  HGB 7.1* 5.7* 7.0* 8.0* 7.4*  HCT 23.7* 19.4* 23.2* 25.4* 23.9*  MCV 91.9 93.7 94.7 91.7 92.6  PLT 346 245 211 215 532   Basic Metabolic Panel: Recent Labs  Lab 06/03/22 1615 06/05/22 1120 06/06/22 0602  NA 140 140 142  K 3.7 3.5 3.3*  CL 112* 111 114*  CO2 21* 21* 23  GLUCOSE 101* 88 97  BUN '13 7 7  '$ CREATININE 1.15* 0.71 0.66  CALCIUM 9.0 8.7* 8.9  MG  --  1.9 1.9  PHOS  --   --  3.5   GFR: Estimated Creatinine  Clearance: 115 mL/min (by C-G formula based on SCr of 0.66 mg/dL). Liver Function Tests: Recent Labs  Lab 06/03/22 1615 06/05/22 1120 06/06/22 0602  AST 12* 12* 10*  ALT '9 10 10  '$ ALKPHOS 55 51 46  BILITOT 0.9 1.3* 0.8  PROT 8.1 6.8 6.2*  ALBUMIN 3.7 3.3* 2.9*   Recent Labs  Lab 06/03/22 1615  LIPASE 75*   No results for input(s): "AMMONIA" in the last 168 hours. Coagulation Profile: Recent Labs  Lab 06/04/22 0740  INR 1.1   Cardiac Enzymes: No results for input(s): "CKTOTAL", "CKMB", "CKMBINDEX", "TROPONINI" in the last 168 hours. BNP (last 3 results) No results for input(s): "PROBNP" in the last 8760 hours. HbA1C: No results for input(s): "HGBA1C" in the last 72 hours. CBG: No results for input(s): "GLUCAP" in the last 168 hours. Lipid Profile: No results for input(s): "CHOL", "HDL", "LDLCALC", "TRIG", "CHOLHDL", "LDLDIRECT" in the last 72 hours. Thyroid Function Tests: No results for input(s): "TSH", "T4TOTAL", "FREET4", "T3FREE", "THYROIDAB" in the last 72 hours. Anemia Panel: Recent Labs    06/04/22 0300  VITAMINB12 219  FOLATE 8.4  FERRITIN 11  TIBC 381  IRON 16*  RETICCTPCT 5.0*   Sepsis Labs: No results for input(s): "PROCALCITON", "LATICACIDVEN" in the last 168 hours.  No results found for this or any previous visit (from the past 240 hour(s)).       Radiology Studies: No results found.      Scheduled Meds:  amLODipine  10 mg Oral Daily   carvedilol  25 mg Oral BID WC   cloNIDine  0.2 mg Oral Q8H   pantoprazole (PROTONIX) IV  40 mg Intravenous Q12H   potassium chloride  20 mEq Oral BID   Continuous Infusions:  lactated ringers 75 mL/hr at 06/06/22 0517     LOS: 2 days    Time spent: 35 minutes    Barb Merino, MD [facility] Pager [phone]

## 2022-06-06 NOTE — Plan of Care (Signed)
Pt c/o abdominal pain 5/10.  States that she wants to wait until EGD is done today for any additional meds.    Problem: Education: Goal: Knowledge of General Education information will improve Description: Including pain rating scale, medication(s)/side effects and non-pharmacologic comfort measures Outcome: Progressing   Problem: Health Behavior/Discharge Planning: Goal: Ability to manage health-related needs will improve Outcome: Progressing   Problem: Clinical Measurements: Goal: Ability to maintain clinical measurements within normal limits will improve Outcome: Progressing Goal: Will remain free from infection Outcome: Progressing Goal: Diagnostic test results will improve Outcome: Progressing Goal: Respiratory complications will improve Outcome: Progressing Goal: Cardiovascular complication will be avoided Outcome: Progressing   Problem: Activity: Goal: Risk for activity intolerance will decrease Outcome: Progressing   Problem: Nutrition: Goal: Adequate nutrition will be maintained Outcome: Progressing   Problem: Coping: Goal: Level of anxiety will decrease Outcome: Progressing   Problem: Elimination: Goal: Will not experience complications related to urinary retention Outcome: Progressing   Problem: Safety: Goal: Ability to remain free from injury will improve Outcome: Progressing   Problem: Skin Integrity: Goal: Risk for impaired skin integrity will decrease Outcome: Progressing

## 2022-06-06 NOTE — Op Note (Signed)
[facility] Patient Name: Robin Guerrero Procedure Date: 06/06/2022 MRN: [id number] Attending MD: Arta Silence , MD Date of Birth: [birth year] CSN: [id number] Age: 36 Admit Type: Inpatient Procedure:                Upper GI endoscopy Indications:              Acute post hemorrhagic anemia, , Hematemesis,                            chronic pancreatitis with pseudocyst Providers:                Arta Silence, MD, Carlyn Reichert, RN, Theodora Blow,                            Technician Referring MD:             [facility] Medicines:                Monitored Anesthesia Care Complications:            No immediate complications. Estimated Blood Loss:     Estimated blood loss: none. Procedure:                Pre-Anesthesia Assessment:                           - Prior to the procedure, a History and Physical                            was performed, and patient medications and                            allergies were reviewed. The patient's tolerance of                            previous anesthesia was also reviewed. The risks                            and benefits of the procedure and the sedation                            options and risks were discussed with the patient.                            All questions were answered, and informed consent                            was obtained. Prior Anticoagulants: The patient has                            taken no previous anticoagulant or antiplatelet                            agents. ASA Grade Assessment: III - A patient with  severe systemic disease. After reviewing the risks                            and benefits, the patient was deemed in                            satisfactory condition to undergo the procedure.                           After obtaining informed consent, the endoscope was                            passed under direct vision. Throughout the                             procedure, the patient's blood pressure, pulse, and                            oxygen saturations were monitored continuously. The                            [id number]-H190 ([id number] Olympus endoscope was introduced                            through the mouth, and advanced to the second part                            of duodenum. The upper GI endoscopy was                            accomplished without difficulty. The patient                            tolerated the procedure well. Scope In: Scope Out: Findings:      The examined esophagus was normal.      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 Alternatively,       this could represent extrinsic compression or some other sort of       vascular anomaly. There were no stigmata of recent bleeding.      Patchy mild inflammation was found in the entire examined stomach.      The exam of the stomach was otherwise normal.      Some red patches of blood in proximal/mid duodenum. After lavage, there       was what appeared to be a transient episode of blood withnessed through       the ampulla; upon inspection for another couple minutes, however, no       further bleeding transpired.      The exam of the duodenum was otherwise normal. Impression:               - Normal esophagus.                           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                           - Gastritis.                           - Sporadic blood per ampulla, self-limited. History                            chronic pancreatitis. Moderate Sedation:      None Recommendation:           - Return patient to hospital ward for ongoing care.                           - Clear liquid diet today.                           -  Continue present medications.                           - I have discussed case with Dr. 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                           - [facility] will follow. Procedure Code(s):        --- Professional ---                           [id number], Esophagogastroduodenoscopy, flexible,                            transoral; diagnostic, including collection of                            specimen(s) by brushing or washing, when performed                            (separate procedure) Diagnosis Code(s):        --- Professional ---                           I86.4, Gastric varices                           K29.70, Gastritis, unspecified, without bleeding                           D62, Acute posthemorrhagic anemia  K92.0, Hematemesis CPT copyright 2019 [employer]. All rights reserved. The codes documented in this report are preliminary and upon coder review may  be revised to meet current compliance requirements. Arta Silence, MD 06/06/2022 2:52:26 PM This report has been signed electronically. Number of Addenda: 0

## 2022-06-07 DIAGNOSIS — D62 Acute posthemorrhagic anemia: Secondary | ICD-10-CM | POA: Diagnosis not present

## 2022-06-07 LAB — BASIC METABOLIC PANEL
Anion gap: 7 (ref 5–15)
BUN: 6 mg/dL (ref 6–20)
CO2: 22 mmol/L (ref 22–32)
Calcium: 8.6 mg/dL — ABNORMAL LOW (ref 8.9–10.3)
Chloride: 112 mmol/L — ABNORMAL HIGH (ref 98–111)
Creatinine, Ser: 0.6 mg/dL (ref 0.44–1.00)
GFR, Estimated: >60 mL/min (ref >60)
Glucose, Bld: 118 mg/dL — ABNORMAL HIGH (ref 70–99)
Potassium: 3.6 mmol/L (ref 3.5–5.1)
Sodium: 141 mmol/L (ref 135–145)

## 2022-06-07 LAB — HEMOGLOBIN AND HEMATOCRIT, BLOOD
HCT: 23.9 % — ABNORMAL LOW (ref 36.0–46.0)
Hemoglobin: 7.3 g/dL — ABNORMAL LOW (ref 12.0–15.0)

## 2022-06-07 MED ORDER — SODIUM CHLORIDE 0.9 % IV SOLN
250.0000 mg | Freq: Once | INTRAVENOUS | Status: AC
Start: 2022-06-07 — End: 2022-06-07
  Administered 2022-06-07: 250 mg via INTRAVENOUS
  Filled 2022-06-07: qty 10

## 2022-06-08 DIAGNOSIS — D62 Acute posthemorrhagic anemia: Secondary | ICD-10-CM | POA: Diagnosis not present

## 2022-06-08 LAB — HEMOGLOBIN AND HEMATOCRIT, BLOOD
HCT: 24 % — ABNORMAL LOW (ref 36.0–46.0)
Hemoglobin: 7.4 g/dL — ABNORMAL LOW (ref 12.0–15.0)

## 2022-06-08 MED ORDER — LOSARTAN POTASSIUM 100 MG PO TABS: 100.0000 mg | ORAL_TABLET | Freq: Every day | ORAL | 2 refills | Status: DC

## 2022-06-08 MED ORDER — PROPRANOLOL HCL 10 MG PO TABS: 10.0000 mg | ORAL_TABLET | Freq: Two times a day (BID) | ORAL | 2 refills | Status: DC

## 2022-06-08 MED ORDER — OXYCODONE HCL 5 MG PO TABS: 5.0000 mg | ORAL_TABLET | Freq: Three times a day (TID) | ORAL | 0 refills | Status: DC | PRN

## 2022-06-08 MED ORDER — ONDANSETRON HCL 4 MG PO TABS: 4.0000 mg | ORAL_TABLET | Freq: Three times a day (TID) | ORAL | 0 refills | Status: DC | PRN

## 2022-06-10 ENCOUNTER — Inpatient Hospital Stay (HOSPITAL_COMMUNITY)
Admission: EM | Admit: 2022-06-10 | Discharge: 2022-06-14 | DRG: 438 | Disposition: A | Discharge status: Home / Self Care | Payer: 59 | Attending: Internal Medicine | Admitting: Internal Medicine

## 2022-06-10 DIAGNOSIS — F1011 Alcohol abuse, in remission: Secondary | ICD-10-CM | POA: Diagnosis present

## 2022-06-10 DIAGNOSIS — K2971 Gastritis, unspecified, with bleeding: Secondary | ICD-10-CM | POA: Diagnosis not present

## 2022-06-10 DIAGNOSIS — F17210 Nicotine dependence, cigarettes, uncomplicated: Secondary | ICD-10-CM | POA: Diagnosis present

## 2022-06-10 DIAGNOSIS — K861 Other chronic pancreatitis: Secondary | ICD-10-CM | POA: Diagnosis present

## 2022-06-10 DIAGNOSIS — I7100 Dissection of unspecified site of aorta: Secondary | ICD-10-CM | POA: Diagnosis present

## 2022-06-10 DIAGNOSIS — I71012 Dissection of descending thoracic aorta: Secondary | ICD-10-CM | POA: Diagnosis present

## 2022-06-10 DIAGNOSIS — Z8249 Family history of ischemic heart disease and other diseases of the circulatory system: Secondary | ICD-10-CM

## 2022-06-10 DIAGNOSIS — I1A0 Resistant hypertension: Secondary | ICD-10-CM | POA: Diagnosis present

## 2022-06-10 DIAGNOSIS — K859 Acute pancreatitis without necrosis or infection, unspecified: Principal | ICD-10-CM | POA: Diagnosis present

## 2022-06-10 DIAGNOSIS — F32A Depression, unspecified: Secondary | ICD-10-CM | POA: Diagnosis present

## 2022-06-10 DIAGNOSIS — Z803 Family history of malignant neoplasm of breast: Secondary | ICD-10-CM

## 2022-06-10 DIAGNOSIS — Z888 Allergy status to other drugs, medicaments and biological substances status: Secondary | ICD-10-CM

## 2022-06-10 DIAGNOSIS — K863 Pseudocyst of pancreas: Secondary | ICD-10-CM | POA: Diagnosis present

## 2022-06-10 DIAGNOSIS — K920 Hematemesis: Secondary | ICD-10-CM

## 2022-06-10 DIAGNOSIS — E876 Hypokalemia: Secondary | ICD-10-CM | POA: Diagnosis present

## 2022-06-10 DIAGNOSIS — K219 Gastro-esophageal reflux disease without esophagitis: Secondary | ICD-10-CM | POA: Diagnosis present

## 2022-06-10 DIAGNOSIS — I864 Gastric varices: Secondary | ICD-10-CM | POA: Diagnosis present

## 2022-06-10 DIAGNOSIS — D509 Iron deficiency anemia, unspecified: Secondary | ICD-10-CM

## 2022-06-10 DIAGNOSIS — I10 Essential (primary) hypertension: Secondary | ICD-10-CM | POA: Diagnosis present

## 2022-06-10 DIAGNOSIS — Z79899 Other long term (current) drug therapy: Secondary | ICD-10-CM

## 2022-06-11 ENCOUNTER — Other Ambulatory Visit: Payer: Self-pay

## 2022-06-11 ENCOUNTER — Encounter (HOSPITAL_COMMUNITY): Payer: Self-pay

## 2022-06-11 DIAGNOSIS — K861 Other chronic pancreatitis: Secondary | ICD-10-CM

## 2022-06-11 DIAGNOSIS — K859 Acute pancreatitis without necrosis or infection, unspecified: Secondary | ICD-10-CM | POA: Diagnosis not present

## 2022-06-11 DIAGNOSIS — D509 Iron deficiency anemia, unspecified: Secondary | ICD-10-CM

## 2022-06-11 LAB — CBC WITH DIFFERENTIAL/PLATELET
Abs Immature Granulocytes: 0.12 10*3/uL — ABNORMAL HIGH (ref 0.00–0.07)
Basophils Absolute: 0 10*3/uL (ref 0.0–0.1)
Basophils Relative: 0 %
Eosinophils Absolute: 0.1 10*3/uL (ref 0.0–0.5)
Eosinophils Relative: 1 %
HCT: 28.7 % — ABNORMAL LOW (ref 36.0–46.0)
Hemoglobin: 8.8 g/dL — ABNORMAL LOW (ref 12.0–15.0)
Immature Granulocytes: 1 %
Lymphocytes Relative: 10 %
Lymphs Abs: 1.4 10*3/uL (ref 0.7–4.0)
MCH: 27.9 pg (ref 26.0–34.0)
MCHC: 30.7 g/dL (ref 30.0–36.0)
MCV: 91.1 fL (ref 80.0–100.0)
Monocytes Absolute: 0.7 10*3/uL (ref 0.1–1.0)
Monocytes Relative: 5 %
Neutro Abs: 11.2 10*3/uL — ABNORMAL HIGH (ref 1.7–7.7)
Neutrophils Relative %: 83 %
Platelets: 240 10*3/uL (ref 150–400)
RBC: 3.15 MIL/uL — ABNORMAL LOW (ref 3.87–5.11)
RDW: 17.2 % — ABNORMAL HIGH (ref 11.5–15.5)
WBC: 13.6 10*3/uL — ABNORMAL HIGH (ref 4.0–10.5)
nRBC: 0 % (ref 0.0–0.2)

## 2022-06-11 LAB — URINALYSIS, ROUTINE W REFLEX MICROSCOPIC
Bilirubin Urine: NEGATIVE
Glucose, UA: NEGATIVE mg/dL
Hgb urine dipstick: NEGATIVE
Ketones, ur: 20 mg/dL — AB
Leukocytes,Ua: NEGATIVE
Nitrite: NEGATIVE
Protein, ur: NEGATIVE mg/dL
Specific Gravity, Urine: 1.014 (ref 1.005–1.030)
pH: 6 (ref 5.0–8.0)

## 2022-06-11 LAB — COMPREHENSIVE METABOLIC PANEL
ALT: 11 U/L (ref 0–44)
AST: 15 U/L (ref 15–41)
Albumin: 3.7 g/dL (ref 3.5–5.0)
Alkaline Phosphatase: 54 U/L (ref 38–126)
Anion gap: 9 (ref 5–15)
BUN: 10 mg/dL (ref 6–20)
CO2: 19 mmol/L — ABNORMAL LOW (ref 22–32)
Calcium: 8.7 mg/dL — ABNORMAL LOW (ref 8.9–10.3)
Chloride: 109 mmol/L (ref 98–111)
Creatinine, Ser: 0.78 mg/dL (ref 0.44–1.00)
GFR, Estimated: >60 mL/min (ref >60)
Glucose, Bld: 132 mg/dL — ABNORMAL HIGH (ref 70–99)
Potassium: 3.6 mmol/L (ref 3.5–5.1)
Sodium: 137 mmol/L (ref 135–145)
Total Bilirubin: 0.9 mg/dL (ref 0.3–1.2)
Total Protein: 7.6 g/dL (ref 6.5–8.1)

## 2022-06-11 LAB — LIPASE, BLOOD: Lipase: 328 U/L — ABNORMAL HIGH (ref 11–51)

## 2022-06-11 LAB — PREGNANCY, URINE: Preg Test, Ur: NEGATIVE

## 2022-06-11 MED ORDER — OXYCODONE HCL 5 MG PO TABS
5.0000 mg | ORAL_TABLET | ORAL | Status: DC | PRN
  Administered 2022-06-12 – 2022-06-13 (×5): 5 mg via ORAL
  Filled 2022-06-11 (×5): qty 1

## 2022-06-11 MED ORDER — PROPRANOLOL HCL 10 MG PO TABS
10.0000 mg | ORAL_TABLET | Freq: Two times a day (BID) | ORAL | Status: DC
  Administered 2022-06-11 – 2022-06-14 (×7): 10 mg via ORAL
  Filled 2022-06-11 (×7): qty 1

## 2022-06-11 MED ORDER — ACETAMINOPHEN 325 MG PO TABS
650.0000 mg | ORAL_TABLET | Freq: Four times a day (QID) | ORAL | Status: DC | PRN
  Administered 2022-06-13: 650 mg via ORAL
  Filled 2022-06-11: qty 2

## 2022-06-11 MED ORDER — POLYETHYLENE GLYCOL 3350 17 G PO PACK
17.0000 g | PACK | Freq: Every day | ORAL | Status: DC
  Administered 2022-06-11: 17 g via ORAL
  Filled 2022-06-11 (×2): qty 1

## 2022-06-11 MED ORDER — SODIUM CHLORIDE 0.9 % IV SOLN: INTRAVENOUS | Status: DC

## 2022-06-11 MED ORDER — AMLODIPINE BESYLATE 10 MG PO TABS
10.0000 mg | ORAL_TABLET | Freq: Every day | ORAL | Status: DC
  Administered 2022-06-11 – 2022-06-14 (×3): 10 mg via ORAL
  Filled 2022-06-11 (×4): qty 1

## 2022-06-11 MED ORDER — MORPHINE SULFATE (PF) 2 MG/ML IV SOLN
1.0000 mg | INTRAVENOUS | Status: AC | PRN
  Administered 2022-06-11: 1 mg via INTRAVENOUS
  Filled 2022-06-11: qty 1

## 2022-06-11 MED ORDER — MORPHINE SULFATE (PF) 4 MG/ML IV SOLN
4.0000 mg | Freq: Once | INTRAVENOUS | Status: AC
  Administered 2022-06-11: 4 mg via INTRAVENOUS
  Filled 2022-06-11: qty 1

## 2022-06-11 MED ORDER — FENTANYL CITRATE PF 50 MCG/ML IJ SOSY
100.0000 ug | PREFILLED_SYRINGE | Freq: Once | INTRAMUSCULAR | Status: AC
  Administered 2022-06-11: 100 ug via INTRAVENOUS
  Filled 2022-06-11: qty 2

## 2022-06-11 MED ORDER — PANTOPRAZOLE SODIUM 40 MG IV SOLR
40.0000 mg | Freq: Two times a day (BID) | INTRAVENOUS | Status: DC
  Administered 2022-06-11 – 2022-06-14 (×6): 40 mg via INTRAVENOUS
  Filled 2022-06-11 (×6): qty 10

## 2022-06-11 MED ORDER — MORPHINE SULFATE (PF) 4 MG/ML IV SOLN
4.0000 mg | INTRAVENOUS | Status: DC | PRN
  Administered 2022-06-11 – 2022-06-12 (×4): 4 mg via INTRAVENOUS
  Filled 2022-06-11 (×4): qty 1

## 2022-06-11 MED ORDER — ONDANSETRON HCL 4 MG/2ML IJ SOLN
4.0000 mg | Freq: Once | INTRAMUSCULAR | Status: AC
  Administered 2022-06-11: 4 mg via INTRAVENOUS
  Filled 2022-06-11: qty 2

## 2022-06-11 MED ORDER — METOPROLOL TARTRATE 5 MG/5ML IV SOLN: 5.0000 mg | Freq: Four times a day (QID) | INTRAVENOUS | Status: DC | PRN

## 2022-06-11 MED ORDER — PANCRELIPASE (LIP-PROT-AMYL) 12000-38000 UNITS PO CPEP
36000.0000 [IU] | ORAL_CAPSULE | Freq: Three times a day (TID) | ORAL | Status: DC
  Administered 2022-06-11 – 2022-06-14 (×7): 36000 [IU] via ORAL
  Filled 2022-06-11 (×7): qty 3

## 2022-06-11 MED ORDER — PROCHLORPERAZINE EDISYLATE 10 MG/2ML IJ SOLN
10.0000 mg | Freq: Four times a day (QID) | INTRAMUSCULAR | Status: DC | PRN
  Administered 2022-06-11 – 2022-06-12 (×5): 10 mg via INTRAVENOUS
  Filled 2022-06-11 (×5): qty 2

## 2022-06-11 MED ORDER — ACETAMINOPHEN 650 MG RE SUPP: 650.0000 mg | Freq: Four times a day (QID) | RECTAL | Status: DC | PRN

## 2022-06-11 MED ORDER — CLONIDINE HCL 0.2 MG PO TABS
0.2000 mg | ORAL_TABLET | Freq: Three times a day (TID) | ORAL | Status: DC
  Administered 2022-06-11 – 2022-06-14 (×7): 0.2 mg via ORAL
  Filled 2022-06-11 (×8): qty 1

## 2022-06-11 MED ORDER — MORPHINE SULFATE (PF) 2 MG/ML IV SOLN
1.0000 mg | Freq: Once | INTRAVENOUS | Status: AC | PRN
  Administered 2022-06-11: 1 mg via INTRAVENOUS
  Filled 2022-06-11: qty 1

## 2022-06-11 MED ORDER — LOSARTAN POTASSIUM 50 MG PO TABS
100.0000 mg | ORAL_TABLET | Freq: Every day | ORAL | Status: DC
  Administered 2022-06-11 – 2022-06-14 (×3): 100 mg via ORAL
  Filled 2022-06-11 (×4): qty 2

## 2022-06-11 NOTE — Consult Note (Signed)
Referring Provider: Teddy Spike, DO Primary Care Physician:  Norm Salt, PA Primary Gastroenterologist:  Enid Baas, Dr. Marca Ancona  Reason for Consultation:  Recurrent pancreatitis, GI bleed  HPI: Robin Guerrero is a 36 y.o. female with medical history significant of iron deficiency anemia, EtOH abuse, type B aortic dissection, HTN, chronic pancreatitis with pseudocyst formation 04/2022. Patient presented to the ED 06/11/22 with abdominal pain, N/V. Recent admission for pancreatitis last week.   Patient states she was feeling okay the first day after discharge home, then yesterday started having nausea and vomiting and worsening abdominal pain.  Pain located to the epigastrium with radiation to LUQ and RUQ.  Patient tried taking Tylenol but was unable to keep it down.  Denies any fever, chills, diarrhea, hematemesis, melena, hematochezia.  Patient came to the ED when symptoms did not improve.  Denies any aggravating or alleviating factors.  Patient denies recent alcohol use or smoking.  States she was able to eat a little bit at home, has not had anything since yesterday morning.  Denies NSAIDs, blood thinners, history of MI/stroke.  Denies any family history of GI malignancy or disease.  Patient is on fluids, antiemetics, and pain control.  NPO.  States her pain is worse than it was during last admission.  Patient underwent EGD 06/06/2022, findings of possible type I isolated gastric varices located in the fundus without bleeding.  Gastritis.  Sporadic blood per ampulla, self-limited.  CT A/P 06/03/2022 - Interval enlargement well-circumscribed fluid collection in the porta hepatis.  Findings consistent with enlarging pseudocyst.  Narrowing of the main portal vein and related to pancreatitis is improved from comparison exam.  Again demonstrated ill-defined cystic enlargement in the head of the pancreas and tail of the pancreas consistent with chronic pancreatitis.  Minimal change from comparison  exam.  Some improvement in the tail.  CT angio abd/pelvis 06/06/2022 - Negative for evidence of acute GI bleeding.   Past Medical History:  Diagnosis Date   Descending thoracic dissection (HCC)    History of anemia    no current med.   Hypertension    states under control with med., has been on med. x 3 yr.   Pancreatitis     Past Surgical History:  Procedure Laterality Date   ESOPHAGOGASTRODUODENOSCOPY N/A 06/06/2022   Procedure: ESOPHAGOGASTRODUODENOSCOPY (EGD);  Surgeon: Willis Modena, MD;  Location: [facility];  Service: Gastroenterology;  Laterality: N/A;   FOREIGN BODY REMOVAL Left 02/10/2015   Procedure: ATTEMPTED EXPLANTATION OF BIRTH CONTROL DEVICE LEFT ARM;  Surgeon: Luretha Murphy, MD;  Location: [facility];  Service: General;  Laterality: Left;   FOREIGN BODY REMOVAL Left 05/12/2015   Procedure: REMOVAL OF LEFT ARM BIRTH CONTROL DEVICE;  Surgeon: Luretha Murphy, MD;  Location: [facility];  Service: General;  Laterality: Left;   SUBMANDIBULAR GLAND EXCISION Right 05/01/2009    Prior to Admission medications   Medication Sig Start Date End Date Taking? Authorizing Provider  amLODipine (NORVASC) 10 MG tablet Take 1 tablet (10 mg total) by mouth daily. 03/08/22  Yes Patwardhan, Manish J, MD  cloNIDine (CATAPRES) 0.2 MG tablet Take 1 tablet (0.2 mg total) by mouth every 8 (eight) hours. 03/08/22 06/12/23 Yes Patwardhan, Manish J, MD  ferrous sulfate 325 (65 FE) MG tablet TAKE 1 TABLET BY MOUTH EVERY DAY WITH BREAKFAST Patient taking differently: Take 325 mg by mouth daily. 03/25/22  Yes Patwardhan, Manish J, MD  hydrALAZINE (APRESOLINE) 100 MG tablet Take 1 tablet (100 mg total) by  mouth 3 (three) times daily. 03/08/22 03/08/23 Yes Patwardhan, Manish J, MD  losartan (COZAAR) 100 MG tablet Take 1 tablet (100 mg total) by mouth daily. 06/08/22 09/06/22 Yes Dorcas Carrow, MD  ondansetron (ZOFRAN) 4 MG tablet Take 1 tablet (4 mg total) by mouth every 8  (eight) hours as needed for nausea or vomiting. 06/08/22  Yes Dorcas Carrow, MD  oxyCODONE (ROXICODONE) 5 MG immediate release tablet Take 1 tablet (5 mg total) by mouth every 8 (eight) hours as needed for moderate pain or severe pain. 06/08/22 06/08/23 Yes Ghimire, Lyndel Safe, MD  pantoprazole (PROTONIX) 40 MG tablet Take 40 mg by mouth daily. 05/29/22  Yes [provider]  propranolol (INDERAL) 10 MG tablet Take 1 tablet (10 mg total) by mouth 2 (two) times daily. 06/08/22 09/06/22 Yes Dorcas Carrow, MD    Scheduled Meds:  amLODipine  10 mg Oral Daily   cloNIDine  0.2 mg Oral Q8H   losartan  100 mg Oral Daily   propranolol  10 mg Oral BID   Continuous Infusions:  sodium chloride     PRN Meds:.acetaminophen **OR** acetaminophen, metoprolol tartrate, morphine injection, oxyCODONE, prochlorperazine  Allergies as of 06/10/2022 - Review Complete 06/07/2022  Allergen Reaction Noted   Ativan [lorazepam] Other (See Comments) 11/13/2020    Family History  Problem Relation Age of Onset   Breast cancer Mother 19   Heart disease Mother    Hypertension Mother     Social History   Socioeconomic History   Marital status: Single    Spouse name: Not on file   Number of children: 2   Years of education: Not on file   Highest education level: Not on file  Occupational History   Not on file  Tobacco Use   Smoking status: Every Day    Packs/day: 0.25    Years: 5.00    Total pack years: 1.25    Types: Cigarettes   Smokeless tobacco: Never   Tobacco comments:    1 pack/week  Vaping Use   Vaping Use: Never used  Substance and Sexual Activity   Alcohol use: Yes    Comment: occasionally   Drug use: No   Sexual activity: Not Currently  Other Topics Concern   Not on file  Social History Narrative   Not on file   Social Determinants of Health   Financial Resource Strain: Not on file  Food Insecurity: Not on file  Transportation Needs: Not on file  Physical Activity: Not on file   Stress: Not on file  Social Connections: Not on file  Intimate Partner Violence: Not on file    Review of Systems: Review of Systems  Constitutional:  Negative for chills and fever.  HENT:  Negative for sore throat.   Eyes:  Negative for discharge and redness.  Respiratory:  Negative for cough, wheezing and stridor.   Cardiovascular:  Negative for chest pain and leg swelling.  Gastrointestinal:  Positive for abdominal pain, nausea and vomiting. Negative for blood in stool, constipation, diarrhea, heartburn and melena.  Genitourinary:  Negative for dysuria and urgency.  Musculoskeletal:  Negative for falls and joint pain.  Skin:  Negative for itching and rash.  Neurological:  Negative for seizures and loss of consciousness.  Endo/Heme/Allergies:  Negative for environmental allergies and polydipsia.  Psychiatric/Behavioral:  Negative for memory loss. The patient does not have insomnia.      Physical Exam: Physical Exam Constitutional:      General: She is not in acute distress.  Appearance: She is ill-appearing.  HENT:     Head: Normocephalic and atraumatic.     Right Ear: External ear normal.     Left Ear: External ear normal.     Nose: Nose normal.     Mouth/Throat:     Mouth: Mucous membranes are moist.     Pharynx: Oropharynx is clear.  Eyes:     General: No scleral icterus.    Extraocular Movements: Extraocular movements intact.     Conjunctiva/sclera: Conjunctivae normal.  Cardiovascular:     Rate and Rhythm: Normal rate and regular rhythm.     Pulses: Normal pulses.     Heart sounds: Normal heart sounds.  Pulmonary:     Effort: Pulmonary effort is normal.     Breath sounds: Normal breath sounds.  Abdominal:     General: Bowel sounds are normal.     Palpations: Abdomen is soft.     Tenderness: There is abdominal tenderness (RUQ, LUQ).  Musculoskeletal:     Cervical back: Normal range of motion and neck supple.     Right lower leg: No edema.     Left lower  leg: No edema.  Skin:    General: Skin is warm and dry.  Neurological:     General: No focal deficit present.     Mental Status: She is alert and oriented to person, place, and time.  Psychiatric:        Behavior: Behavior normal.        Thought Content: Thought content normal.      Vital signs: Vitals:   06/11/22 0536 06/11/22 0605  BP: (!) 168/104 (!) 167/102  Pulse: 98 (!) 101  Resp: 18 18  Temp: 98.2 F (36.8 C) 97.9 F (36.6 C)  SpO2: 99% 99%   Last BM Date : 06/09/22    GI:  Lab Results: Recent Labs    06/11/22 0025  WBC 13.6*  HGB 8.8*  HCT 28.7*  PLT 240   BMET Recent Labs    06/11/22 0025  NA 137  K 3.6  CL 109  CO2 19*  GLUCOSE 132*  BUN 10  CREATININE 0.78  CALCIUM 8.7*   LFT Recent Labs    06/11/22 0025  PROT 7.6  ALBUMIN 3.7  AST 15  ALT 11  ALKPHOS 54  BILITOT 0.9   PT/INR No results for input(s): "LABPROT", "INR" in the last 72 hours.   Studies/Results: No results found.  Impression: Acute on chronic pancreatitis - Currently receiving fluids, antiemetics, pain control. - NPO - Patient had CT A/P and CT angio during last admission.  Has not had any repeat imaging since presentation today. - Leukocytosis with WBC 13.6 - Lipase 328 - Patient denies current alcohol use  GERD - On Protonix  Iron Deficiency Anemia - Hemoglobin 8.8, improved since last admission with no evidence of active GI bleeding  Plan: Patient with recurrent pancreatitis. Okay to hold off on repeat imaging for now. Continue IV fluids, pain control, antiemetics. NPO. No evidence of overt GI bleeding and hemoglobin has improved since last admission.Continue daily CBC and transfuse as needed to maintain HGB > 7.  Continue supportive care. [facility] will follow.    LOS: 0 days   Berdine Dance  PA-C 06/11/2022, 9:20 AM  Contact #  [phone]

## 2022-06-11 NOTE — ED Triage Notes (Signed)
Pt reports with upper abdominal pain, nausea, and vomiting for over a week. Pt states that she has chronic pancreatitis and was just in the hospital for it.

## 2022-06-12 DIAGNOSIS — K219 Gastro-esophageal reflux disease without esophagitis: Secondary | ICD-10-CM | POA: Diagnosis present

## 2022-06-12 DIAGNOSIS — E876 Hypokalemia: Secondary | ICD-10-CM | POA: Diagnosis present

## 2022-06-12 DIAGNOSIS — K2971 Gastritis, unspecified, with bleeding: Secondary | ICD-10-CM | POA: Diagnosis not present

## 2022-06-12 DIAGNOSIS — K861 Other chronic pancreatitis: Secondary | ICD-10-CM | POA: Diagnosis present

## 2022-06-12 DIAGNOSIS — Z888 Allergy status to other drugs, medicaments and biological substances status: Secondary | ICD-10-CM | POA: Diagnosis not present

## 2022-06-12 DIAGNOSIS — K859 Acute pancreatitis without necrosis or infection, unspecified: Secondary | ICD-10-CM | POA: Diagnosis present

## 2022-06-12 DIAGNOSIS — D509 Iron deficiency anemia, unspecified: Secondary | ICD-10-CM | POA: Diagnosis present

## 2022-06-12 DIAGNOSIS — Z79899 Other long term (current) drug therapy: Secondary | ICD-10-CM | POA: Diagnosis not present

## 2022-06-12 DIAGNOSIS — I864 Gastric varices: Secondary | ICD-10-CM | POA: Diagnosis present

## 2022-06-12 DIAGNOSIS — K863 Pseudocyst of pancreas: Secondary | ICD-10-CM | POA: Diagnosis present

## 2022-06-12 DIAGNOSIS — F32A Depression, unspecified: Secondary | ICD-10-CM | POA: Diagnosis present

## 2022-06-12 DIAGNOSIS — I10 Essential (primary) hypertension: Secondary | ICD-10-CM | POA: Diagnosis present

## 2022-06-12 DIAGNOSIS — K2970 Gastritis, unspecified, without bleeding: Secondary | ICD-10-CM | POA: Diagnosis not present

## 2022-06-12 DIAGNOSIS — F17210 Nicotine dependence, cigarettes, uncomplicated: Secondary | ICD-10-CM | POA: Diagnosis present

## 2022-06-12 DIAGNOSIS — I71012 Dissection of descending thoracic aorta: Secondary | ICD-10-CM | POA: Diagnosis present

## 2022-06-12 DIAGNOSIS — Z803 Family history of malignant neoplasm of breast: Secondary | ICD-10-CM | POA: Diagnosis not present

## 2022-06-12 DIAGNOSIS — Z8249 Family history of ischemic heart disease and other diseases of the circulatory system: Secondary | ICD-10-CM | POA: Diagnosis not present

## 2022-06-12 LAB — COMPREHENSIVE METABOLIC PANEL
ALT: 11 U/L (ref 0–44)
AST: 13 U/L — ABNORMAL LOW (ref 15–41)
Albumin: 3.1 g/dL — ABNORMAL LOW (ref 3.5–5.0)
Alkaline Phosphatase: 47 U/L (ref 38–126)
Anion gap: 6 (ref 5–15)
BUN: 8 mg/dL (ref 6–20)
CO2: 21 mmol/L — ABNORMAL LOW (ref 22–32)
Calcium: 8.7 mg/dL — ABNORMAL LOW (ref 8.9–10.3)
Chloride: 115 mmol/L — ABNORMAL HIGH (ref 98–111)
Creatinine, Ser: 0.69 mg/dL (ref 0.44–1.00)
GFR, Estimated: >60 mL/min (ref >60)
Glucose, Bld: 84 mg/dL (ref 70–99)
Potassium: 3.6 mmol/L (ref 3.5–5.1)
Sodium: 142 mmol/L (ref 135–145)
Total Bilirubin: 0.8 mg/dL (ref 0.3–1.2)
Total Protein: 6.4 g/dL — ABNORMAL LOW (ref 6.5–8.1)

## 2022-06-12 LAB — CBC
HCT: 25.9 % — ABNORMAL LOW (ref 36.0–46.0)
Hemoglobin: 7.8 g/dL — ABNORMAL LOW (ref 12.0–15.0)
MCH: 28.1 pg (ref 26.0–34.0)
MCHC: 30.1 g/dL (ref 30.0–36.0)
MCV: 93.2 fL (ref 80.0–100.0)
Platelets: 156 10*3/uL (ref 150–400)
RBC: 2.78 MIL/uL — ABNORMAL LOW (ref 3.87–5.11)
RDW: 16.8 % — ABNORMAL HIGH (ref 11.5–15.5)
WBC: 7.4 10*3/uL (ref 4.0–10.5)
nRBC: 0 % (ref 0.0–0.2)

## 2022-06-12 MED ORDER — POTASSIUM CHLORIDE CRYS ER 20 MEQ PO TBCR
20.0000 meq | EXTENDED_RELEASE_TABLET | Freq: Once | ORAL | Status: AC
  Administered 2022-06-12: 20 meq via ORAL
  Filled 2022-06-12: qty 1

## 2022-06-12 MED ORDER — MORPHINE SULFATE (PF) 2 MG/ML IV SOLN
2.0000 mg | INTRAVENOUS | Status: DC | PRN
  Administered 2022-06-12: 2 mg via INTRAVENOUS
  Filled 2022-06-12: qty 1

## 2022-06-12 NOTE — Plan of Care (Signed)

## 2022-06-12 NOTE — Progress Notes (Signed)
PROGRESS NOTE    Robin Guerrero  DPO:[id number] DOB: 10-01-1986 DOA: 06/10/2022 PCP: Trey Sailors, PA   Brief Narrative: 36 year old with past medical history significant for iron deficiency anemia, EtOH abuse, type B aortic dissection, hypertension, chronic pancreatitis presented with abdominal pain, nausea and vomiting.  She was admitted for pancreatitis last week.  She was okay the first day after she got home.  The day prior to admission she developed nausea vomiting.  She has not had any further alcohol intake.  She was also complaining of epigastric abdominal pain.  She was evaluated by GI who recommended pancreatic enzyme and advance diet as tolerated.   Assessment & Plan:   Principal Problem:   Acute on chronic pancreatitis (HCC) Active Problems:   GERD without esophagitis   History of alcohol abuse   Resistant hypertension   Aortic dissection (HCC)   Iron deficiency anemia   1-Acute on Chronic Pancreatitis: Continue with IV fluids, but will decrease the rate. He had 2 recent CT last week which show continued pseudocyst.  [facility] recommended stat pancreatic enzyme, and follow-up as an outpatient Plan to monitor overnight to make sure that she tolerates diet  2-Hypertension: Norvasc and Cozaar held this morning due to soft systolic blood pressure in the 110. Continue with clonidine, but hold the parameter for systolic blood pressure less than 130.  3-GERD: Continue with PPI  Descending thoracic aortic dissection: Continue with outpatient follow-up.  Blood pressure control  EtOH: Denies drinking alcohol  Iron deficiency anemia: Continue to monitor Recent GI bleed: Endoscopy showed possible gastric varix versus gastritis: Continue with PPI     Estimated body mass index is 29.53 kg/m as calculated from the following:   Height as of this encounter: '5\' 9"'$  (1.753 m).   Weight as of this encounter: 90.7 kg.   DVT prophylaxis: SCD, recent GI bleed.  Code  Status: Full code Family Communication: Care discussed with patient.  Disposition Plan:  Status is: Observation The patient remains OBS appropriate and will d/c before 2 midnights.    Consultants:  GI  Procedures:  None  Antimicrobials:    Subjective: She report some improvement of abdominal pain.  Denies any recent  alcohol use.   Objective: Vitals:   06/11/22 1546 06/11/22 2016 06/12/22 0523 06/12/22 0842  BP: 129/83 134/88 109/79 100/61  Pulse: 86 (!) 102 93 94  Resp: 16  16   Temp: 98 F (36.7 C) 98.1 F (36.7 C) 98.3 F (36.8 C) 98.6 F (37 C)  TempSrc: Oral Oral Oral Oral  SpO2: 94% 99% 95% 93%  Weight:      Height:        Intake/Output Summary (Last 24 hours) at 06/12/2022 1046 Last data filed at 06/12/2022 0651 Gross per 24 hour  Intake 2823.54 ml  Output --  Net 2823.54 ml   Filed Weights   06/11/22 0002  Weight: 90.7 kg    Examination:  General exam: Appears calm and comfortable  Respiratory system: Clear to auscultation. Respiratory effort normal. Cardiovascular system: S1 & S2 heard, RRR. No JVD, murmurs, rubs, gallops or clicks. No pedal edema. Gastrointestinal system: Abdomen is nondistended, soft and nontender. No organomegaly or masses felt. Normal bowel sounds heard. Central nervous system: Alert and oriented.  Extremities: Symmetric 5 x 5 power.   Data Reviewed: I have personally reviewed following labs and imaging studies  CBC: Recent Labs  Lab 06/06/22 0602 06/07/22 0751 06/08/22 0742 06/11/22 0025 06/12/22 0638  WBC 5.2  --   --  13.6* 7.4  NEUTROABS 3.7  --   --  11.2*  --   HGB 7.4* 7.3* 7.4* 8.8* 7.8*  HCT 23.9* 23.9* 24.0* 28.7* 25.9*  MCV 92.6  --   --  91.1 93.2  PLT 179  --   --  240 825   Basic Metabolic Panel: Recent Labs  Lab 06/05/22 1120 06/06/22 0602 06/07/22 0633 06/11/22 0025 06/12/22 0638  NA 140 142 141 137 142  K 3.5 3.3* 3.6 3.6 3.6  CL 111 114* 112* 109 115*  CO2 21* 23 22 19* 21*  GLUCOSE  88 97 118* 132* 84  BUN '7 7 6 10 8  '$ CREATININE 0.71 0.66 0.60 0.78 0.69  CALCIUM 8.7* 8.9 8.6* 8.7* 8.7*  MG 1.9 1.9  --   --   --   PHOS  --  3.5  --   --   --    GFR: Estimated Creatinine Clearance: 116.6 mL/min (by C-G formula based on SCr of 0.69 mg/dL). Liver Function Tests: Recent Labs  Lab 06/05/22 1120 06/06/22 0602 06/11/22 0025 06/12/22 0638  AST 12* 10* 15 13*  ALT '10 10 11 11  '$ ALKPHOS 51 46 54 47  BILITOT 1.3* 0.8 0.9 0.8  PROT 6.8 6.2* 7.6 6.4*  ALBUMIN 3.3* 2.9* 3.7 3.1*   Recent Labs  Lab 06/11/22 0025  LIPASE 328*   No results for input(s): "AMMONIA" in the last 168 hours. Coagulation Profile: No results for input(s): "INR", "PROTIME" in the last 168 hours. Cardiac Enzymes: No results for input(s): "CKTOTAL", "CKMB", "CKMBINDEX", "TROPONINI" in the last 168 hours. BNP (last 3 results) No results for input(s): "PROBNP" in the last 8760 hours. HbA1C: No results for input(s): "HGBA1C" in the last 72 hours. CBG: No results for input(s): "GLUCAP" in the last 168 hours. Lipid Profile: No results for input(s): "CHOL", "HDL", "LDLCALC", "TRIG", "CHOLHDL", "LDLDIRECT" in the last 72 hours. Thyroid Function Tests: No results for input(s): "TSH", "T4TOTAL", "FREET4", "T3FREE", "THYROIDAB" in the last 72 hours. Anemia Panel: No results for input(s): "VITAMINB12", "FOLATE", "FERRITIN", "TIBC", "IRON", "RETICCTPCT" in the last 72 hours. Sepsis Labs: No results for input(s): "PROCALCITON", "LATICACIDVEN" in the last 168 hours.  No results found for this or any previous visit (from the past 240 hour(s)).       Radiology Studies: No results found.      Scheduled Meds:  amLODipine  10 mg Oral Daily   cloNIDine  0.2 mg Oral Q8H   lipase/protease/amylase  36,000 Units Oral TID AC   losartan  100 mg Oral Daily   pantoprazole (PROTONIX) IV  40 mg Intravenous Q12H   polyethylene glycol  17 g Oral Daily   propranolol  10 mg Oral BID   Continuous  Infusions:  sodium chloride 150 mL/hr at 06/12/22 0651     LOS: 0 days    Time spent: 35 minutes    Robin Guerrero A Robin Hitsman, MD [facility]   If 7PM-7AM, please contact night-coverage [email or web address]  06/12/2022, 10:46 AM

## 2022-06-12 NOTE — Progress Notes (Signed)
[facility] Progress Note  Robin Guerrero 36 y.o. 12/19/1985  CC:  Recurrent pancreatitis, GI bleed   Subjective: Patient seen and examined at bedside.  States she is feeling better today.  Abdominal pain improved, denies any nausea or vomiting today.  Has not had a bowel movement but is hesitant to take MiraLAX because she does not want to upset her stomach further.  Tolerating clear liquids.  Denies fever, chills, dysuria, heartburn.  ROS : Review of Systems  Constitutional:  Negative for chills and fever.  Gastrointestinal:  Positive for abdominal pain and constipation. Negative for blood in stool, diarrhea, heartburn, melena, nausea and vomiting.  Genitourinary:  Negative for dysuria and urgency.      Objective: Vital signs in last 24 hours: Vitals:   06/12/22 0842 06/12/22 1057  BP: 100/61 106/81  Pulse: 94 89  Resp:    Temp: 98.6 F (37 C)   SpO2: 93%     Physical Exam:  General:  Alert, cooperative, no distress, appears stated age  Head:  Normocephalic, without obvious abnormality, atraumatic  Eyes:  Anicteric sclera, EOM's intact  Lungs:   Clear to auscultation bilaterally, respirations unlabored  Heart:  Regular rate and rhythm, S1, S2 normal  Abdomen:   Soft, some tenderness to palpation of epigastrium/upper abdomen, bowel sounds active all four quadrants,  no masses  Extremities: Extremities normal, atraumatic, no  edema  Pulses: 2+ and symmetric    Lab Results: Recent Labs    06/11/22 0025 06/12/22 0638  NA 137 142  K 3.6 3.6  CL 109 115*  CO2 19* 21*  GLUCOSE 132* 84  BUN 10 8  CREATININE 0.78 0.69  CALCIUM 8.7* 8.7*   Recent Labs    06/11/22 0025 06/12/22 0638  AST 15 13*  ALT 11 11  ALKPHOS 54 47  BILITOT 0.9 0.8  PROT 7.6 6.4*  ALBUMIN 3.7 3.1*   Recent Labs    06/11/22 0025 06/12/22 0638  WBC 13.6* 7.4  NEUTROABS 11.2*  --   HGB 8.8* 7.8*  HCT 28.7* 25.9*  MCV 91.1 93.2  PLT 240 156   No results for input(s):  "LABPROT", "INR" in the last 72 hours.    Assessment Acute on chronic pancreatitis - Currently receiving fluids, antiemetics, pain control. - Patient had CT A/P and CT angio during last admission.  Has not had any repeat imaging since presentation today. - Leukocytosis improved - Lipase 328 - Patient denies current alcohol use   GERD - On Protonix   Iron Deficiency Anemia - Hemoglobin 7.8, no evidence of active GI bleeding   Plan: Patient with recurrent pancreatitis, improving. Okay to hold off on repeat imaging for now. Continue IV fluids, pain control, antiemetics.  Continue full liquid diet for now. Hopefully can continue to advance tomorrow. No evidence of overt GI bleeding and hemoglobin has improved since last admission.Continue daily CBC and transfuse as needed to maintain HGB > 7.  Continue supportive care. [facility] will follow  Angelique Holm PA-C 06/12/2022, 12:07 PM  Contact #  [phone]

## 2022-06-13 DIAGNOSIS — K859 Acute pancreatitis without necrosis or infection, unspecified: Secondary | ICD-10-CM | POA: Diagnosis not present

## 2022-06-13 DIAGNOSIS — K861 Other chronic pancreatitis: Secondary | ICD-10-CM | POA: Diagnosis not present

## 2022-06-13 LAB — CBC
HCT: 21.9 % — ABNORMAL LOW (ref 36.0–46.0)
HCT: 27.1 % — ABNORMAL LOW (ref 36.0–46.0)
Hemoglobin: 6.5 g/dL — CL (ref 12.0–15.0)
Hemoglobin: 8.4 g/dL — ABNORMAL LOW (ref 12.0–15.0)
MCH: 27.9 pg (ref 26.0–34.0)
MCH: 28.2 pg (ref 26.0–34.0)
MCHC: 29.7 g/dL — ABNORMAL LOW (ref 30.0–36.0)
MCHC: 31 g/dL (ref 30.0–36.0)
MCV: 94 fL (ref 80.0–100.0)
MCV: 90.9 fL (ref 80.0–100.0)
Platelets: 136 10*3/uL — ABNORMAL LOW (ref 150–400)
Platelets: 161 10*3/uL (ref 150–400)
RBC: 2.33 MIL/uL — ABNORMAL LOW (ref 3.87–5.11)
RBC: 2.98 MIL/uL — ABNORMAL LOW (ref 3.87–5.11)
RDW: 16.9 % — ABNORMAL HIGH (ref 11.5–15.5)
RDW: 17.4 % — ABNORMAL HIGH (ref 11.5–15.5)
WBC: 4.8 10*3/uL (ref 4.0–10.5)
WBC: 6 10*3/uL (ref 4.0–10.5)
nRBC: 0 % (ref 0.0–0.2)
nRBC: 0 % (ref 0.0–0.2)

## 2022-06-13 LAB — PREPARE RBC (CROSSMATCH)

## 2022-06-13 LAB — BASIC METABOLIC PANEL
Anion gap: 4 — ABNORMAL LOW (ref 5–15)
BUN: 9 mg/dL (ref 6–20)
CO2: 20 mmol/L — ABNORMAL LOW (ref 22–32)
Calcium: 8.4 mg/dL — ABNORMAL LOW (ref 8.9–10.3)
Chloride: 117 mmol/L — ABNORMAL HIGH (ref 98–111)
Creatinine, Ser: 0.64 mg/dL (ref 0.44–1.00)
GFR, Estimated: >60 mL/min (ref >60)
Glucose, Bld: 91 mg/dL (ref 70–99)
Potassium: 3.4 mmol/L — ABNORMAL LOW (ref 3.5–5.1)
Sodium: 141 mmol/L (ref 135–145)

## 2022-06-13 MED ORDER — SODIUM CHLORIDE 0.9% IV SOLUTION
Freq: Once | INTRAVENOUS | Status: AC
Start: 2022-06-13 — End: 2022-06-13

## 2022-06-13 MED ORDER — SODIUM CHLORIDE 0.9 % IV SOLN: INTRAVENOUS | Status: DC

## 2022-06-13 MED ORDER — VITAMIN B-12 1000 MCG PO TABS
1000.0000 ug | ORAL_TABLET | Freq: Every day | ORAL | Status: DC
  Administered 2022-06-13 – 2022-06-14 (×2): 1000 ug via ORAL
  Filled 2022-06-13 (×2): qty 1

## 2022-06-13 MED ORDER — POTASSIUM CHLORIDE CRYS ER 20 MEQ PO TBCR
40.0000 meq | EXTENDED_RELEASE_TABLET | Freq: Once | ORAL | Status: AC
  Administered 2022-06-13: 40 meq via ORAL
  Filled 2022-06-13: qty 2

## 2022-06-13 NOTE — TOC Initial Note (Addendum)
Transition of Care [facility]) - Initial/Assessment Note    Patient Details  Name: Robin Guerrero MRN: [id number] Date of Birth: 1986-09-10  Transition of Care [facility]) CM/SW Contact:    Robin Catalan, RN Phone Guerrero: 06/13/2022, 9:58 AM  Clinical Narrative:                 Spoke with pt at bedside for dc planning. Pt asking about getting FMLA paperwork completed for her work. Pt states her current PCP is Robin Number PA, and she has been seen there several times. Pt states that now she has new insurance she is having difficulty with communication with the office. She states that they never call her back. Pt encouraged to have family member take FMLA paperwork to the MD office to be filled out. She was also make aware that if that PCP no longer takes her insurance that she may need to call the phone Guerrero on the back of her insurance card to see which providers are in her network. She states that she will do this.   Expected Discharge Plan: Home/Self Care Barriers to Discharge: Continued Medical Work up   Patient Goals and CMS Choice Patient states their goals for this hospitalization and ongoing recovery are:: Home      Expected Discharge Plan and Services Expected Discharge Plan: Home/Self Care   Discharge Planning Services: CM Consult   Living arrangements for the past 2 months: Apartment                                      Prior Living Arrangements/Services Living arrangements for the past 2 months: Apartment   Patient language and need for interpreter reviewed:: Yes        Need for Family Participation in Patient Care: No (Comment) Care giver support system in place?: Yes (comment)   Criminal Activity/Legal Involvement Pertinent to Current Situation/Hospitalization: No - Comment as needed  Activities of Daily Living Home Assistive Devices/Equipment: None ADL Screening (condition at time of admission) Patient's cognitive ability adequate to safely complete  daily activities?: Yes Is the patient deaf or have difficulty hearing?: No Does the patient have difficulty seeing, even when wearing glasses/contacts?: No Does the patient have difficulty concentrating, remembering, or making decisions?: No Patient able to express need for assistance with ADLs?: Yes Does the patient have difficulty dressing or bathing?: No Independently performs ADLs?: Yes (appropriate for developmental age) Does the patient have difficulty walking or climbing stairs?: No Weakness of Legs: None Weakness of Arms/Hands: None  Permission Sought/Granted                  Emotional Assessment Appearance:: Appears stated age Attitude/Demeanor/Rapport: Gracious Affect (typically observed): Calm Orientation: : Oriented to Self, Oriented to Place, Oriented to  Time, Oriented to Situation Alcohol / Substance Use: Not Applicable Psych Involvement: No (comment)  Admission diagnosis:  Acute pancreatitis [K85.90] Acute pancreatitis, unspecified complication status, unspecified pancreatitis type [K85.90] Patient Active Problem List   Diagnosis Date Noted   Iron deficiency anemia 06/11/2022   Acute blood loss anemia 06/04/2022   GERD without esophagitis 06/04/2022   History of alcohol abuse 06/04/2022   Pseudocyst, pancreas 06/04/2022   Acute pancreatitis 04/22/2022   Aortic dissection ([place]) 02/20/2022   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   ARF (acute renal failure) ([place]) 06/04/2021   Acute on chronic pancreatitis ([place]) 06/04/2021   H/O aortic dissection 01/31/2021   Descending  thoracic aortic dissection ([place]) 11/09/2020   Malignant hypertension    Nausea    Back pain    Hypocalcemia 33/00/7622   Acute alcoholic pancreatitis 63/33/5456   Thrombocytopenia ([place]) 08/19/2020   Normocytic anemia 25/63/8937   Alcoholic pancreatitis 34/28/7681   SIRS (systemic inflammatory response syndrome) ([place]) 08/18/2020   Hypokalemia 08/18/2020   Hypomagnesemia 08/18/2020    Prolonged QT interval 08/18/2020   Essential hypertension 08/18/2020   Depression 08/18/2020   Tobacco use 08/18/2020   PCP:  Robin Sailors, PA Pharmacy:   [id number] [place] [place] [place] [address] [place] [address]Phone: [phone]: [phone] [facility] [address] [place]Phone: [phone]: [phone]    Social Determinants of Health (SDOH) Interventions    Readmission Risk Interventions    06/13/2022    9:56 AM 06/05/2022    3:15 PM  Readmission Risk Prevention Plan  Transportation Screening Complete Complete  PCP or Specialist Appt within 3-5 Days Complete Complete  HRI or Home Care Consult Complete Complete  Social Work Consult for [place] Complete Complete  Palliative Care Screening Not Applicable Not Applicable  Medication Review (Press photographer Complete Complete

## 2022-06-13 NOTE — Progress Notes (Signed)
PROGRESS NOTE    Robin Guerrero  BDZ:[id number] DOB: [birth year] DOA: 06/10/2022 PCP: Trey Sailors, PA   Brief Narrative: 36 year old with past medical history significant for iron deficiency anemia, EtOH abuse, type B aortic dissection, hypertension, chronic pancreatitis presented with abdominal pain, nausea and vomiting.  She was admitted for pancreatitis last week.  She was okay the first day after she got home.  The day prior to admission she developed nausea vomiting.  She has not had any further alcohol intake.  She was also complaining of epigastric abdominal pain.  She was evaluated by GI who recommended pancreatic enzyme and advance diet as tolerated.   Assessment & Plan:   Principal Problem:   Acute on chronic pancreatitis (HCC) Active Problems:   GERD without esophagitis   History of alcohol abuse   Resistant hypertension   Aortic dissection (HCC)   Acute pancreatitis   Iron deficiency anemia   1-Acute on Chronic Pancreatitis: Continue with IV fluids, but will decrease the rate. He had 2 recent CT last week which show continued pseudocyst.  [facility] recommended pancreatic enzyme, and follow-up as an outpatient. Denies worsening abdominal pain.    2-GI bleed; She reported vomiting 300 cc dark blood and food yesterday.  Hb down to 6.5  Getting one unit PRBC>  GI informed of change in condition.  IV PPI Plan for endoscopy tomorrow.   Hypertension: Norvasc and Cozaar held this morning due to soft systolic blood pressure in the 110. Continue with clonidine, but hold the parameter for systolic blood pressure less than 130.  3-GERD: Continue with PPI  Descending thoracic aortic dissection: Continue with outpatient follow-up.  Blood pressure control  EtOH: Denies drinking alcohol  Iron deficiency anemia: Continue to monitor. Getting blood transfusion.  Recent GI bleed: Endoscopy showed possible gastric varix versus gastritis: Continue with  PPI  Hypokalemia; replete orally.    Estimated body mass index is 29.53 kg/m as calculated from the following:   Height as of this encounter: '5\' 9"'$  (1.753 m).   Weight as of this encounter: 90.7 kg.   DVT prophylaxis: SCD, recent GI bleed.  Code Status: Full code Family Communication: Care discussed with patient.  Disposition Plan:  Status is: Observation The patient remains OBS appropriate and will d/c before 2 midnights.    Consultants:  GI  Procedures:  None  Antimicrobials:    Subjective: She denies worsening abdominal pain. She vomited 300 cc dark blood mixt with food yesterday   Objective: Vitals:   06/13/22 0444 06/13/22 0920 06/13/22 1000 06/13/22 1215  BP: 121/80 140/86 135/85 (!) 159/100  Pulse: 89 86 95 94  Resp: '16 18 16 18  '$ Temp: 98.2 F (36.8 C) 98.6 F (37 C) 98.6 F (37 C) 98.4 F (36.9 C)  TempSrc: Oral Oral Oral Oral  SpO2: 98% 100% 98% 99%  Weight:      Height:        Intake/Output Summary (Last 24 hours) at 06/13/2022 1540 Last data filed at 06/13/2022 1300 Gross per 24 hour  Intake 2165.97 ml  Output --  Net 2165.97 ml    Filed Weights   06/11/22 0002  Weight: 90.7 kg    Examination:  General exam: ANAD Respiratory system: CTA Cardiovascular system; S 1., S 2 RRR Gastrointestinal system: BS present, soft, nt Central nervous system: alert Extremities: non edema   Data Reviewed: I have personally reviewed following labs and imaging studies  CBC: Recent Labs  Lab 06/07/22 0751 06/08/22 0742 06/11/22 0025  06/12/22 0638 06/13/22 0518  WBC  --   --  13.6* 7.4 4.8  NEUTROABS  --   --  11.2*  --   --   HGB 7.3* 7.4* 8.8* 7.8* 6.5*  HCT 23.9* 24.0* 28.7* 25.9* 21.9*  MCV  --   --  91.1 93.2 94.0  PLT  --   --  240 156 136*    Basic Metabolic Panel: Recent Labs  Lab 06/07/22 0633 06/11/22 0025 06/12/22 0638 06/13/22 0518  NA 141 137 142 141  K 3.6 3.6 3.6 3.4*  CL 112* 109 115* 117*  CO2 22 19* 21* 20*   GLUCOSE 118* 132* 84 91  BUN '6 10 8 9  '$ CREATININE 0.60 0.78 0.69 0.64  CALCIUM 8.6* 8.7* 8.7* 8.4*    GFR: Estimated Creatinine Clearance: 116.6 mL/min (by C-G formula based on SCr of 0.64 mg/dL). Liver Function Tests: Recent Labs  Lab 06/11/22 0025 06/12/22 0638  AST 15 13*  ALT 11 11  ALKPHOS 54 47  BILITOT 0.9 0.8  PROT 7.6 6.4*  ALBUMIN 3.7 3.1*    Recent Labs  Lab 06/11/22 0025  LIPASE 328*    No results for input(s): "AMMONIA" in the last 168 hours. Coagulation Profile: No results for input(s): "INR", "PROTIME" in the last 168 hours. Cardiac Enzymes: No results for input(s): "CKTOTAL", "CKMB", "CKMBINDEX", "TROPONINI" in the last 168 hours. BNP (last 3 results) No results for input(s): "PROBNP" in the last 8760 hours. HbA1C: No results for input(s): "HGBA1C" in the last 72 hours. CBG: No results for input(s): "GLUCAP" in the last 168 hours. Lipid Profile: No results for input(s): "CHOL", "HDL", "LDLCALC", "TRIG", "CHOLHDL", "LDLDIRECT" in the last 72 hours. Thyroid Function Tests: No results for input(s): "TSH", "T4TOTAL", "FREET4", "T3FREE", "THYROIDAB" in the last 72 hours. Anemia Panel: No results for input(s): "VITAMINB12", "FOLATE", "FERRITIN", "TIBC", "IRON", "RETICCTPCT" in the last 72 hours. Sepsis Labs: No results for input(s): "PROCALCITON", "LATICACIDVEN" in the last 168 hours.  No results found for this or any previous visit (from the past 240 hour(s)).       Radiology Studies: No results found.      Scheduled Meds:  sodium chloride   Intravenous Once   amLODipine  10 mg Oral Daily   cloNIDine  0.2 mg Oral Q8H   lipase/protease/amylase  36,000 Units Oral TID AC   losartan  100 mg Oral Daily   pantoprazole (PROTONIX) IV  40 mg Intravenous Q12H   polyethylene glycol  17 g Oral Daily   propranolol  10 mg Oral BID   vitamin B-12  1,000 mcg Oral Daily   Continuous Infusions:  sodium chloride 100 mL/hr at 06/13/22 0500     LOS:  1 day    Time spent: 35 minutes    Mattalynn Crandle A Chaddrick Brue, MD [facility]   If 7PM-7AM, please contact night-coverage [email or web address]  06/13/2022, 3:40 PM

## 2022-06-13 NOTE — H&P (View-Only) (Signed)
[facility] Progress Note  Robin Guerrero 36 y.o. 16-Feb-1986  CC:  GI bleeding   Subjective: Patient seen and examined at bedside.  States she had an episode of vomiting yesterday evening, dark blood mixed with food present in her vomitus.  Patient tearful this morning and eager to go home.  States her rent is due for July and she is overdue on her payments for June.  Concerned about this extended period of time out of work. She still has not had a bowel movement but is agreeable to MiraLAX.  ROS : Review of Systems  Constitutional:  Negative for chills and fever.  Gastrointestinal:  Positive for abdominal pain, constipation, nausea and vomiting. Negative for blood in stool, diarrhea, heartburn and melena.  Genitourinary:  Negative for dysuria and urgency.  Musculoskeletal:  Negative for falls.     Objective: Vital signs in last 24 hours: Vitals:   06/13/22 0920 06/13/22 1000  BP: 140/86 135/85  Pulse: 86 95  Resp: 18 16  Temp: 98.6 F (37 C) 98.6 F (37 C)  SpO2: 100% 98%    Physical Exam:  General:  Alert, cooperative, no distress but is tearful due to present circumstances  Head:  Normocephalic, without obvious abnormality, atraumatic  Eyes:  Anicteric sclera, EOM's intact  Lungs:   Clear to auscultation bilaterally, respirations unlabored  Heart:  Regular rate and rhythm, S1, S2 normal  Abdomen:   Soft, minimal tenderness to palpation of epigastrium, bowel sounds active all four quadrants,  no masses  Extremities: Extremities normal, atraumatic, no  edema  Pulses: 2+ and symmetric    Lab Results: Recent Labs    06/12/22 0638 06/13/22 0518  NA 142 141  K 3.6 3.4*  CL 115* 117*  CO2 21* 20*  GLUCOSE 84 91  BUN 8 9  CREATININE 0.69 0.64  CALCIUM 8.7* 8.4*   Recent Labs    06/11/22 0025 06/12/22 0638  AST 15 13*  ALT 11 11  ALKPHOS 54 47  BILITOT 0.9 0.8  PROT 7.6 6.4*  ALBUMIN 3.7 3.1*   Recent Labs    06/11/22 0025 06/12/22 0638  06/13/22 0518  WBC 13.6* 7.4 4.8  NEUTROABS 11.2*  --   --   HGB 8.8* 7.8* 6.5*  HCT 28.7* 25.9* 21.9*  MCV 91.1 93.2 94.0  PLT 240 156 136*   No results for input(s): "LABPROT", "INR" in the last 72 hours.    Assessment  GI bleeding, Iron Deficiency Anemia - Patient had episode of hematemesis yesterday evening. - Drop in hemoglobin overnight from 7.8 to 6.5.  - Patient has just received 1 unit PRBCs  Plan: Patient will need repeat EGD to evaluate hematemesis. Endoscopy schedule for tomorrow currently full. Will discuss with endo unit and Dr. Alessandra Bevels to determine timing of procedure. Clear liquid diet for now.  Continue daily CBC and transfuse as needed to maintain HGB > 7. Continue supportive care.  [facility] will follow.    Angelique Holm PA-C 06/13/2022, 12:20 PM  Contact #  [phone]

## 2022-06-13 NOTE — Progress Notes (Signed)
[facility] Progress Note  Robin Guerrero 36 y.o. 1986-10-31  CC:  GI bleeding   Subjective: Patient seen and examined at bedside.  States she had an episode of vomiting yesterday evening, dark blood mixed with food present in her vomitus.  Patient tearful this morning and eager to go home.  States her rent is due for July and she is overdue on her payments for June.  Concerned about this extended period of time out of work. She still has not had a bowel movement but is agreeable to [employer].  ROS : Review of Systems  Constitutional:  Negative for chills and fever.  Gastrointestinal:  Positive for abdominal pain, constipation, nausea and vomiting. Negative for blood in stool, diarrhea, heartburn and melena.  Genitourinary:  Negative for dysuria and urgency.  Musculoskeletal:  Negative for falls.     Objective: Vital signs in last 24 hours: Vitals:   06/13/22 0920 06/13/22 1000  BP: 140/86 135/85  Pulse: 86 95  Resp: 18 16  Temp: 98.6 F (37 C) 98.6 F (37 C)  SpO2: 100% 98%    Physical Exam:  General:  Alert, cooperative, no distress but is tearful due to present circumstances  Head:  Normocephalic, without obvious abnormality, atraumatic  Eyes:  Anicteric sclera, EOM's intact  Lungs:   Clear to auscultation bilaterally, respirations unlabored  Heart:  Regular rate and rhythm, S1, S2 normal  Abdomen:   Soft, minimal tenderness to palpation of epigastrium, bowel sounds active all four quadrants,  no masses  Extremities: Extremities normal, atraumatic, no  edema  Pulses: 2+ and symmetric    Lab Results: Recent Labs    06/12/22 0638 06/13/22 0518  NA 142 141  K 3.6 3.4*  CL 115* 117*  CO2 21* 20*  GLUCOSE 84 91  BUN 8 9  CREATININE 0.69 0.64  CALCIUM 8.7* 8.4*   Recent Labs    06/11/22 0025 06/12/22 0638  AST 15 13*  ALT 11 11  ALKPHOS 54 47  BILITOT 0.9 0.8  PROT 7.6 6.4*  ALBUMIN 3.7 3.1*   Recent Labs    06/11/22 0025 06/12/22 0638  06/13/22 0518  WBC 13.6* 7.4 4.8  NEUTROABS 11.2*  --   --   HGB 8.8* 7.8* 6.5*  HCT 28.7* 25.9* 21.9*  MCV 91.1 93.2 94.0  PLT 240 156 136*   No results for input(s): "LABPROT", "INR" in the last 72 hours.    Assessment  GI bleeding, Iron Deficiency Anemia - Patient had episode of hematemesis yesterday evening. - Drop in hemoglobin overnight from 7.8 to 6.5.  - Patient has just received 1 unit PRBCs  Plan: Patient will need repeat EGD to evaluate hematemesis. Endoscopy schedule for tomorrow currently full. Will discuss with endo unit and Dr. Alessandra Bevels to determine timing of procedure. Clear liquid diet for now.  Continue daily CBC and transfuse as needed to maintain HGB > 7. Continue supportive care.  [facility] will follow.    Angelique Holm PA-C 06/13/2022, 12:20 PM  Contact #  [phone]

## 2022-06-14 ENCOUNTER — Encounter (HOSPITAL_COMMUNITY)
Admission: EM | Disposition: A | Discharge status: Home / Self Care | Payer: Self-pay | Source: Home / Self Care | Attending: Internal Medicine

## 2022-06-14 ENCOUNTER — Encounter (HOSPITAL_COMMUNITY): Payer: Self-pay | Admitting: Internal Medicine

## 2022-06-14 ENCOUNTER — Inpatient Hospital Stay (HOSPITAL_COMMUNITY): Payer: 59 | Admitting: Anesthesiology

## 2022-06-14 DIAGNOSIS — K861 Other chronic pancreatitis: Secondary | ICD-10-CM | POA: Diagnosis not present

## 2022-06-14 DIAGNOSIS — I10 Essential (primary) hypertension: Secondary | ICD-10-CM

## 2022-06-14 DIAGNOSIS — I864 Gastric varices: Secondary | ICD-10-CM

## 2022-06-14 DIAGNOSIS — K859 Acute pancreatitis without necrosis or infection, unspecified: Secondary | ICD-10-CM | POA: Diagnosis not present

## 2022-06-14 DIAGNOSIS — K2970 Gastritis, unspecified, without bleeding: Secondary | ICD-10-CM

## 2022-06-14 DIAGNOSIS — F17210 Nicotine dependence, cigarettes, uncomplicated: Secondary | ICD-10-CM

## 2022-06-14 HISTORY — PX: ESOPHAGOGASTRODUODENOSCOPY: SHX5428

## 2022-06-14 LAB — CBC
HCT: 24.2 % — ABNORMAL LOW (ref 36.0–46.0)
Hemoglobin: 7.5 g/dL — ABNORMAL LOW (ref 12.0–15.0)
MCH: 27.8 pg (ref 26.0–34.0)
MCHC: 31 g/dL (ref 30.0–36.0)
MCV: 89.6 fL (ref 80.0–100.0)
Platelets: 144 10*3/uL — ABNORMAL LOW (ref 150–400)
RBC: 2.7 MIL/uL — ABNORMAL LOW (ref 3.87–5.11)
RDW: 17.3 % — ABNORMAL HIGH (ref 11.5–15.5)
WBC: 4.6 10*3/uL (ref 4.0–10.5)
nRBC: 0 % (ref 0.0–0.2)

## 2022-06-14 LAB — BASIC METABOLIC PANEL
Anion gap: 6 (ref 5–15)
BUN: 6 mg/dL (ref 6–20)
CO2: 20 mmol/L — ABNORMAL LOW (ref 22–32)
Calcium: 8.2 mg/dL — ABNORMAL LOW (ref 8.9–10.3)
Chloride: 112 mmol/L — ABNORMAL HIGH (ref 98–111)
Creatinine, Ser: 0.67 mg/dL (ref 0.44–1.00)
GFR, Estimated: >60 mL/min (ref >60)
Glucose, Bld: 92 mg/dL (ref 70–99)
Potassium: 3.5 mmol/L (ref 3.5–5.1)
Sodium: 138 mmol/L (ref 135–145)

## 2022-06-14 LAB — PREPARE RBC (CROSSMATCH)

## 2022-06-14 SURGERY — EGD (ESOPHAGOGASTRODUODENOSCOPY)
Anesthesia: Monitor Anesthesia Care

## 2022-06-14 MED ORDER — CYANOCOBALAMIN 1000 MCG PO TABS: 1000.0000 ug | ORAL_TABLET | Freq: Every day | ORAL | 0 refills | Status: DC

## 2022-06-14 MED ORDER — LIP MEDEX EX OINT
TOPICAL_OINTMENT | CUTANEOUS | Status: DC | PRN
  Administered 2022-06-14: 75 via TOPICAL
  Filled 2022-06-14: qty 7

## 2022-06-14 MED ORDER — POLYETHYLENE GLYCOL 3350 17 G PO PACK: 17.0000 g | PACK | Freq: Every day | ORAL | 0 refills | Status: DC

## 2022-06-14 MED ORDER — SODIUM CHLORIDE 0.9% IV SOLUTION
Freq: Once | INTRAVENOUS | Status: AC
Start: 2022-06-14 — End: 2022-06-14

## 2022-06-14 MED ORDER — PANCRELIPASE (LIP-PROT-AMYL) 36000-114000 UNITS PO CPEP: 36000.0000 [IU] | ORAL_CAPSULE | Freq: Three times a day (TID) | ORAL | 3 refills | Status: DC

## 2022-06-14 MED ORDER — LIDOCAINE 2% (20 MG/ML) 5 ML SYRINGE
INTRAMUSCULAR | Status: DC | PRN
  Administered 2022-06-14: 100 mg via INTRAVENOUS

## 2022-06-14 MED ORDER — OXYCODONE HCL 5 MG PO TABS: 5.0000 mg | ORAL_TABLET | Freq: Three times a day (TID) | ORAL | 0 refills | Status: AC | PRN

## 2022-06-14 MED ORDER — PROPOFOL 500 MG/50ML IV EMUL
INTRAVENOUS | Status: DC | PRN
  Administered 2022-06-14: 125 ug/kg/min via INTRAVENOUS

## 2022-06-14 MED ORDER — LACTATED RINGERS IV SOLN: INTRAVENOUS | Status: DC | PRN

## 2022-06-14 MED ORDER — PANTOPRAZOLE SODIUM 40 MG PO TBEC: 40.0000 mg | DELAYED_RELEASE_TABLET | Freq: Two times a day (BID) | ORAL | 1 refills | Status: DC

## 2022-06-14 NOTE — Transfer of Care (Signed)
Immediate Anesthesia Transfer of Care Note  Patient: Robin Guerrero  Procedure(s) Performed: ESOPHAGOGASTRODUODENOSCOPY (EGD)  Patient Location: Endoscopy Unit  Anesthesia Type:MAC  Level of Consciousness: awake, alert  and oriented  Airway & Oxygen Therapy: Patient Spontanous Breathing and Patient connected to face mask oxygen  Post-op Assessment: Report given to RN and Post -op Vital signs reviewed and stable  Post vital signs: Reviewed and stable  Last Vitals:  Vitals Value Taken Time  BP 164/101   Temp    Pulse 78   Resp 12   SpO2 98%     Last Pain:  Vitals:   06/14/22 1339  TempSrc: Axillary  PainSc: 0-No pain      Patients Stated Pain Goal: 0 (42/55/25 8948)  Complications: No notable events documented.

## 2022-06-14 NOTE — Op Note (Signed)
[facility] Patient Name: Robin Guerrero Procedure Date: 06/14/2022 MRN: [id number] Attending MD: [name] , MD Date of Birth: March 18, 1986 CSN: [id number] Age: 36 Admit Type: Inpatient Procedure:                Upper GI endoscopy Indications:              Hematemesis Providers:                [name], MD, Jaci Carrel, RN,                            William Dalton, Technician, Darliss Cheney, Technician Referring MD:              Medicines:                Sedation Administered by an Anesthesia Professional Complications:            No immediate complications. Estimated Blood Loss:     Estimated blood loss was minimal. Procedure:                Pre-Anesthesia Assessment:                           - Prior to the procedure, a History and Physical                            was performed, and patient medications and                            allergies were reviewed. The patient's tolerance of                            previous anesthesia was also reviewed. The risks                            and benefits of the procedure and the sedation                            options and risks were discussed with the patient.                            All questions were answered, and informed consent                            was obtained. Prior Anticoagulants: The patient has                            taken no previous anticoagulant or antiplatelet                            agents. ASA Grade Assessment: III - A patient with                            severe systemic disease. After reviewing the risks  and benefits, the patient was deemed in                            satisfactory condition to undergo the procedure.                           After obtaining informed consent, the endoscope was                            passed under direct vision. Throughout the                            procedure, the patient's blood pressure,  pulse, and                            oxygen saturations were monitored continuously. The                            [id number]-[id number] ([id number]) Olympus endoscope was introduced                            through the mouth, and advanced to the second part                            of duodenum. The upper GI endoscopy was                            accomplished without difficulty. The patient                            tolerated the procedure well. Scope In: Scope Out: Findings:      The Z-line was regular and was found 37 cm from the incisors.      Type 1 isolated gastric varices (IGV1, varices located in the fundus)       with no bleeding were found in the cardia. There were no stigmata of       recent bleeding. They were large in largest diameter.      Scattered mild inflammation characterized by congestion (edema) and       erythema was found in the gastric body.      The duodenal bulb, first portion of the duodenum and second portion of       the duodenum were normal. Impression:               - Z-line regular, 37 cm from the incisors.                           - Type 1 isolated gastric varices (IGV1, varices                            located in the fundus), without bleeding.                           - Gastritis.                           -  Normal duodenal bulb, first portion of the                            duodenum and second portion of the duodenum.                           - No specimens collected. Moderate Sedation:      Moderate (conscious) sedation was personally administered by an       anesthesia professional. The following parameters were monitored: oxygen       saturation, heart rate, blood pressure, and response to care. Recommendation:           - Return patient to hospital ward for ongoing care.                           - Soft diet.                           - Continue present medications. Procedure Code(s):        --- Professional ---                           [phone],  Esophagogastroduodenoscopy, flexible,                            transoral; diagnostic, including collection of                            specimen(s) by brushing or washing, when performed                            (separate procedure) Diagnosis Code(s):        --- Professional ---                           I86.4, Gastric varices                           K29.70, Gastritis, unspecified, without bleeding                           K92.0, Hematemesis CPT copyright 2019 [employer]. All rights reserved. The codes documented in this report are preliminary and upon coder review may  be revised to meet current compliance requirements. [name], MD [name], MD 06/14/2022 2:22:59 PM Number of Addenda: 0

## 2022-06-14 NOTE — Brief Op Note (Addendum)
06/10/2022 - 06/14/2022  2:25 PM  PATIENT:  Robin Guerrero  36 y.o. female  PRE-OPERATIVE DIAGNOSIS:  hematemesis, anemia  POST-OPERATIVE DIAGNOSIS:  no active bleeding noted  PROCEDURE:  Procedure(s): ESOPHAGOGASTRODUODENOSCOPY (EGD) (N/A)  SURGEON:  Surgeon(s) and Role:    * Ellianne Gowen, MD - Primary   Findings ---------- -EGD was negative for active bleeding.  It showed mild gastritis and large gastric varices in the cardia.  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 -Recommend outpatient follow-up with vascular intervention radiology team after discharge -Okay to discharge from GI standpoint.  She has follow-up with Dr. Rush Landmark in July to discuss cyst gastrostomy. -GI will sign off.  Call us back if needed  [name] MD, Lydia 06/14/2022, 2:27 PM  Contact #  [phone]

## 2022-06-14 NOTE — Interval H&P Note (Signed)
History and Physical Interval Note:  06/14/2022 1:59 PM  Robin Guerrero  has presented today for surgery, with the diagnosis of hematemesis, anemia.  The various methods of treatment have been discussed with the patient and family. After consideration of risks, benefits and other options for treatment, the patient has consented to  Procedure(s): ESOPHAGOGASTRODUODENOSCOPY (EGD) (N/A) as a surgical intervention.  The patient's history has been reviewed, patient examined, no change in status, stable for surgery.  I have reviewed the patient's chart and labs.  Questions were answered to the patient's satisfaction.     [name]

## 2022-06-14 NOTE — Discharge Summary (Signed)
Physician Discharge Summary   Patient: Robin Guerrero MRN: [id number] DOB: July 08, 1986  Admit date:     06/10/2022  Discharge date: 06/14/22  Discharge Physician: Elmarie Shiley   PCP: Trey Sailors, PA   Recommendations at discharge:   Patient will need to follow-up with Dr. Rush Landmark  to discuss cyst gastrostomy. Needs to follow up with IR  Discharge Diagnoses: Principal Problem:   Acute on chronic pancreatitis ([place]) Active Problems:   GERD without esophagitis   History of alcohol abuse   Resistant hypertension   Aortic dissection ([facility])   Acute pancreatitis   Iron deficiency anemia  Resolved Problems:   * No resolved hospital problems. *  Hospital Course: 36 year old with past medical history significant for iron deficiency anemia, EtOH abuse, type B aortic dissection, hypertension, chronic pancreatitis presented with abdominal pain, nausea and vomiting.  She was admitted for pancreatitis last week.  She was okay the first day after she got home.  The day prior to admission she developed nausea vomiting.  She has not had any further alcohol intake.  She was also complaining of epigastric abdominal pain.   She was evaluated by GI who recommended pancreatic enzyme and advance diet as tolerated. Patient subsequently developed hematemesis.  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  GI recommended supportive care.  She will need to follow-up by interventional radiology team after discharge.  Patient to follow-up with Dr. Rush Landmark in July to discuss cyst gastrostomy    Assessment and Plan: 1-Acute on Chronic Pancreatitis: Continue with IV fluids, but will decrease the rate. He had 2 recent CT last week which show continued pseudocyst.  [facility] recommended pancreatic enzyme, and follow-up as an outpatient. Denies worsening abdominal pain.  Continue with soft, diet.  Patient will need to follow-up with Dr. Rush Landmark   to discuss cyst gastrostomy.   2-GI bleed; She reported vomiting 300 cc dark blood and food 6/28 Hb down to 6.5. Received one unit  PRBC> 6/29 Hb increase to 7.5 plan to transfuse another unit PRBC> prior to discharge.  IV PPI Reconsulted, patient underwent endoscopy 6/29; which show type I isolated gastric varices, gastritis, normal duodenal bulb. Patient was previously evaluated by interventional radiology and was deemed high risk because of type B aortic dissection.  GI is recommending patient to follow-up with IR as an outpatient.  Patient requested to be discharge today.   Hypertension: Norvasc and Cozaar held this morning due to soft systolic blood pressure in the 110. Continue with clonidine, but hold the parameter for systolic blood pressure less than 130.   3-GERD: Continue with PPI   Descending thoracic aortic dissection: Continue with outpatient follow-up.  Blood pressure control   EtOH: Denies drinking alcohol   Iron deficiency anemia: Continue to monitor. Getting blood transfusion.  Recent GI bleed: Endoscopy showed possible gastric varix versus gastritis: Continue with PPI   Hypokalemia; Replaced.             Consultants: GI Procedures performed: Endoscopy  Disposition: Home Diet recommendation:  Discharge Diet Orders (From admission, onward)     Start     Ordered   06/14/22 0000  Diet - low sodium heart healthy        06/14/22 1728           Soft diet DISCHARGE MEDICATION: Allergies as of 06/14/2022       Reactions   Ativan [lorazepam] Other (See Comments)   Disorientated, combative  Medication List     TAKE these medications    amLODipine 10 MG tablet Commonly known as: NORVASC Take 1 tablet (10 mg total) by mouth daily.   cloNIDine 0.2 MG tablet Commonly known as: CATAPRES Take 1 tablet (0.2 mg total) by mouth every 8 (eight) hours.   cyanocobalamin 1000 MCG tablet Take 1 tablet (1,000 mcg total) by mouth daily. Start  taking on: June 15, 2022   ferrous sulfate 325 (65 FE) MG tablet TAKE 1 TABLET BY MOUTH EVERY DAY WITH BREAKFAST What changed: See the new instructions.   hydrALAZINE 100 MG tablet Commonly known as: APRESOLINE Take 1 tablet (100 mg total) by mouth 3 (three) times daily.   lipase/protease/amylase 36000 UNITS Cpep capsule Commonly known as: CREON Take 1 capsule (36,000 Units total) by mouth 3 (three) times daily before meals. Start taking on: June 15, 2022   losartan 100 MG tablet Commonly known as: COZAAR Take 1 tablet (100 mg total) by mouth daily.   ondansetron 4 MG tablet Commonly known as: Zofran Take 1 tablet (4 mg total) by mouth every 8 (eight) hours as needed for nausea or vomiting.   oxyCODONE 5 MG immediate release tablet Commonly known as: Roxicodone Take 1 tablet (5 mg total) by mouth every 8 (eight) hours as needed for up to 5 days for moderate pain or severe pain.   pantoprazole 40 MG tablet Commonly known as: Protonix Take 1 tablet (40 mg total) by mouth 2 (two) times daily. What changed: when to take this   polyethylene glycol 17 g packet Commonly known as: MIRALAX / GLYCOLAX Take 17 g by mouth daily. Start taking on: June 15, 2022   propranolol 10 MG tablet Commonly known as: INDERAL Take 1 tablet (10 mg total) by mouth 2 (two) times daily.        Discharge Exam: Filed Weights   06/11/22 0002  Weight: 90.7 kg   NAD Lung; CTA  Condition at discharge: stable  The results of significant diagnostics from this hospitalization (including imaging, microbiology, ancillary and laboratory) are listed below for reference.   Imaging Studies: CT Angio Abd/Pel w/ and/or w/o  Result Date: 06/07/2022 CLINICAL DATA:  36 year old female with possible GI bleeding EXAM: CTA ABDOMEN AND PELVIS WITHOUT AND WITH CONTRAST TECHNIQUE: Multidetector CT imaging of the abdomen and pelvis was performed using the standard protocol during bolus administration of intravenous  contrast. Multiplanar reconstructed images and MIPs were obtained and reviewed to evaluate the vascular anatomy. RADIATION DOSE REDUCTION: This exam was performed according to the departmental dose-optimization program which includes automated exposure control, adjustment of the mA and/or kV according to patient size and/or use of iterative reconstruction technique. CONTRAST:  133m OMNIPAQUE IOHEXOL 350 MG/ML SOLN COMPARISON:  Multiple prior most recent 06/03/2022 Upper endoscopy 06/06/2022 FINDINGS: VASCULAR Aorta: Redemonstration of type B aortic dissection with the dissection flap entering the right iliac system and terminating in the proximal external iliac artery. No significant change in the configuration. Diameter of the aorta at the hiatus is unchanged from the comparison, 3.2 cm. No periaortic fluid or inflammatory changes. Celiac: Celiac artery arises from the leftward channel, although there is likely a fenestration at the celiac artery origin. Celiac artery contributes to splenic artery, left gastric artery, and the common hepatic artery. No pseudoaneurysm or extravasation of contrast. The pancreaticoduodenal branches from the GDA are attenuated. SMA: SMA arises from the leftward channel.  Branches are patent. Renals: - Right: Main right renal artery from the rightward channel. Accessory  right renal artery from the rightward channel. No high-grade stenosis or occlusion. No significant atherosclerotic changes. - Left: Main left renal artery from the leftward channel without significant stenosis. Accessory left renal artery from the leftward channel. IMA: IMA arises from the leftward channel and remains patent. Right lower extremity: Dissection flap continues through the common iliac artery where the diameter is estimated 15 mm. The flap terminates in the proximal external iliac artery. Hypogastric artery patent. External iliac artery patent. Common femoral artery patent without significant  atherosclerosis. Proximal profunda femoris and SFA patent. Left lower extremity: Unremarkable course, caliber, and contour of the left iliac system. No aneurysm, dissection, or occlusion. Hypogastric artery is patent. Common femoral artery patent. Proximal SFA and profunda femoris patent. Veins: Nonocclusive focal portal vein thrombus, associated with the inflammatory changes in the porta hepatis. Right and left portal veins are patent. Distal splenic veins are patent. Review of the MIP images confirms the above findings. NON-VASCULAR Lower chest: Atelectatic changes at the lung bases. Hepatobiliary: Unremarkable appearance of the liver. Unremarkable gallbladder. Redemonstration of well-defined fluid collection/cyst in the subhepatic region, posterior to the left liver and abutting the caudate lobe. Fluid density with no internal flow or complexity. No evidence of internal hemorrhage. Greatest dimension 6.7 cm. Pancreas: Similar appearance of edematous, inflammatory changes of the pancreas, with loss of the fat plane between the duodenum and the pancreatic head. Multi cystic changes of the pancreas tail again demonstrated. No radiopaque stones identified. Specifically, there is no evidence of pseudoaneurysm, hematoma, pooling of blood at the head of the pancreas. Spleen: Borderline enlarged spleen, measuring 12.5 cm craniocaudal span on the coronal images. Developing portal-portal collateral venous drainage in the hilum of the spleen. Adrenals/Urinary Tract: - Right adrenal gland: Unremarkable - Left adrenal gland: Unremarkable. - Right kidney: No hydronephrosis, nephrolithiasis, inflammation, or ureteral dilation. No focal lesion. - Left Kidney: No hydronephrosis, nephrolithiasis, inflammation, or ureteral dilation. No focal lesion. - Urinary Bladder: Unremarkable. Stomach/Bowel: - Stomach: There appears to be a single gastric varix in the cardia of the stomach, which might correlate with findings on recent  endoscopy. This appears to be associated with either an efferent or affarent posterior gastric vein. - Small bowel: Unremarkable - Appendix: Normal. - Colon: Unremarkable. Lymphatic: No adenopathy. Mesenteric: No free fluid or air. No mesenteric adenopathy. Reproductive: Fibroid uterus. Other: Unremarkable adnexa. Musculoskeletal: No evidence of acute fracture. No bony canal narrowing. No significant degenerative changes of the hips. IMPRESSION: CT angiogram is negative for evidence of acute GI bleeding. No pseudoaneurysm or hematoma identified. Redemonstration inflammatory changes related to known chronic pancreatitis. This includes a subhepatic pseudocyst adjacent to the caudate lobe. Evidence of gastric varix in the cardia of the stomach which is associated with the posterior gastric vein Nonocclusive portal vein thrombus. Redemonstration of developing portal-portal draining veins in the hilum of the spleen. Borderline splenomegaly. These changes may reflect developing sinistral portal hypertension secondary to chronic pancreatitis of the pancreatic tail. Chronic type B dissection. Additional ancillary findings as above. Signed, Dulcy Fanny. Nadene Rubins, RPVI [facility] Electronically Signed   By: Corrie Mckusick D.O.   On: 06/07/2022 17:57   CT ABDOMEN PELVIS W CONTRAST  Result Date: 06/03/2022 CLINICAL DATA:  Abdominal pain. History of chronic aortic dissection. History of acute pancreatitis. EXAM: CT ABDOMEN AND PELVIS WITH CONTRAST TECHNIQUE: Multidetector CT imaging of the abdomen and pelvis was performed using the standard protocol following bolus administration of intravenous contrast. RADIATION DOSE REDUCTION: This  exam was performed according to the departmental dose-optimization program which includes automated exposure control, adjustment of the mA and/or kV according to patient size and/or use of iterative reconstruction technique.  CONTRAST:  182m OMNIPAQUE IOHEXOL 300 MG/ML  SOLN COMPARISON:  None Available. FINDINGS: Lower chest: Band of atelectasis of LEFT lung base. No pleural fluid. Hepatobiliary: Thin walled fluid collection in the porta hepatis measures 7.1 by 5.1 cm (image 21/2) increased in volume from 5.1 by 3.3 cm. There is narrowing of the proximal portal vein related to inflammation in the porta hepatis (image 28/2). This finding is slightly improved from comparison exam. LEFT and RIGHT portal veins are patent. No focal hepatic lesion. Gallbladder normal. Pancreas: There is enlargement of the pancreatic head by ill-defined cystic collections measuring 3.7 x 3.6 cm (image 33/2) compared to 3.4 by 3.9 cm. Minimal change visually. There is mild cystic change in the tail the pancreas measuring 3.5 by 2.6 cm slightly improved from comparison exam. The splenic vein is patent. Spleen: Spleen is normal. Splenic vein is patent. There are venous collaterals in the gastrosplenic ligament. Adrenals/urinary tract: Adrenal glands and kidneys are normal. The ureters and bladder normal. Stomach/Bowel: Stomach, small bowel, appendix, and cecum are normal. The colon and rectosigmoid colon are normal. Vascular/Lymphatic: chronic abdominal aortic dissection again noted. The celiac trunk and SMA are opacified. IMA opacified. Renal arteries opacified. Reproductive: Uterus and adnexa unremarkable. Other: No free fluid. Musculoskeletal: No aggressive osseous lesion. IMPRESSION: 1. Interval enlargement well-circumscribed fluid collection in the porta hepatis. Findings consistent with enlarging pseudocysts. 2. Narrowing of the main portal vein related to pancreatitis is improved from comparison exam. 3. Again demonstrated ill-defined cystic enlargement in the head of pancreas and tail the pancreas consistent with chronic pancreatitis. Minimal change from comparison exam. Some improvement the tail. 4. Spleen is normal. Venous collaterals in the  gastrohepatic ligament. 5. State stable chronic dissection of the abdominal aorta. Electronically Signed   By: SSuzy BouchardM.D.   On: 06/03/2022 18:57   CT ABDOMEN W WO CONTRAST  Result Date: 05/17/2022 CLINICAL DATA:  Six months of intermittent nausea and vomiting. History of pancreatitis. EXAM: CT ABDOMEN WITHOUT AND WITH CONTRAST TECHNIQUE: Multidetector CT imaging of the abdomen was performed following the standard protocol before and following the bolus administration of intravenous contrast. RADIATION DOSE REDUCTION: This exam was performed according to the departmental dose-optimization program which includes automated exposure control, adjustment of the mA and/or kV according to patient size and/or use of iterative reconstruction technique. CONTRAST:  1025mISOVUE-300 IOPAMIDOL (ISOVUE-300) INJECTION 61% COMPARISON:  CT May 2023 FINDINGS: Lower chest: Bibasilar atelectasis versus scarring. Hepatobiliary: No suspicious hepatic lesion. Nondistended gallbladder without wall thickening. Mild pericystic fluid likely related to pancreatic inflammation. No biliary ductal dilation. Pancreas: Diffuse pancreatic edema. No definite areas of pancreatic necrosis. Fluid collection in the uncinate process measures 15 mm on image 51/3 previously 12 mm. Small fluid collection in the pancreatic tail measures 16 mm on image 34/3 previously 11 mm. Peripancreatic fluid with a lobular pancreatic fluid collection which is new from prior and extends into the hepatic hilum measuring approximally 5.3 x 3.8 cm on image 29/3. No pancreatic ductal dilation. Spleen: Splenomegaly or focal splenic lesion. Adrenals/Urinary Tract: Bilateral adrenal glands appear normal. No hydronephrosis. Kidneys demonstrate symmetric enhancement. Stomach/Bowel: No radiopaque enteric contrast material was administered. Stomach is unremarkable for degree of distension. Chronic inflammation along the proximal duodenum. No pathologic dilation of small  or large bowel. Moderate volume of formed stool in  the visualized colon. Vascular/Lymphatic: Stable partially visualized type B aortic dissection of the abdominal aorta. Possible occlusion of the gastroduodenal artery secondary to chronic pancreatitis. Portal, splenic and superior mesenteric veins are patent. Pathologically enlarged abdominal lymph nodes. Other: No pneumatosis or pneumoperitoneum. Musculoskeletal: No acute osseous abnormality. IMPRESSION: 1. Acute on chronic interstitial pancreatitis with slight interval increase in size in the 2 small intrapancreatic fluid collections which are favored to reflect pseudocysts. 2. New walled-off fluid collection extending from the pancreas into the hepatic hilum measuring approximally 5.3 x 3.8 cm. 3. Possible occlusion of the gastroduodenal artery secondary to chronic pancreatitis. Portal, splenic and superior mesenteric veins appear patent. 4. Stable partially visualized chronic type B dissection of the abdominal aorta. Electronically Signed   By: Dahlia Bailiff M.D.   On: 05/17/2022 12:06    Microbiology: Results for orders placed or performed during the hospital encounter of 02/20/22  Resp Panel by RT-PCR (Flu A&B, Covid) Nasopharyngeal Swab     Status: None   Collection Time: 02/20/22 11:30 AM   Specimen: Nasopharyngeal Swab; Nasopharyngeal(NP) swabs in vial transport medium  Result Value Ref Range Status   SARS Coronavirus 2 by RT PCR NEGATIVE NEGATIVE Final    Comment: (NOTE) SARS-CoV-2 target nucleic acids are NOT DETECTED.  The SARS-CoV-2 RNA is generally detectable in upper respiratory specimens during the acute phase of infection. The lowest concentration of SARS-CoV-2 viral copies this assay can detect is 138 copies/mL. A negative result does not preclude SARS-Cov-2 infection and should not be used as the sole basis for treatment or other patient management decisions. A negative result may occur with  improper specimen  collection/handling, submission of specimen other than nasopharyngeal swab, presence of viral mutation(s) within the areas targeted by this assay, and inadequate number of viral copies(<138 copies/mL). A negative result must be combined with clinical observations, patient history, and epidemiological information. The expected result is Negative.  Fact Sheet for Patients:  EntrepreneurPulse.com.au  Fact Sheet for Healthcare Providers:  IncredibleEmployment.be  This test is no t yet approved or cleared by the [place] FDA and  has been authorized for detection and/or diagnosis of SARS-CoV-2 by FDA under an Emergency Use Authorization (EUA). This EUA will remain  in effect (meaning this test can be used) for the duration of the COVID-19 declaration under Section 564(b)(1) of the Act, 21 U.S.C.section 360bbb-3(b)(1), unless the authorization is terminated  or revoked sooner.       Influenza A by PCR NEGATIVE NEGATIVE Final   Influenza B by PCR NEGATIVE NEGATIVE Final    Comment: (NOTE) The Xpert Xpress SARS-CoV-2/FLU/RSV plus assay is intended as an aid in the diagnosis of influenza from Nasopharyngeal swab specimens and should not be used as a sole basis for treatment. Nasal washings and aspirates are unacceptable for Xpert Xpress SARS-CoV-2/FLU/RSV testing.  Fact Sheet for Patients: EntrepreneurPulse.com.au  Fact Sheet for Healthcare Providers: IncredibleEmployment.be  This test is not yet approved or cleared by the [place] FDA and has been authorized for detection and/or diagnosis of SARS-CoV-2 by FDA under an Emergency Use Authorization (EUA). This EUA will remain in effect (meaning this test can be used) for the duration of the COVID-19 declaration under Section 564(b)(1) of the Act, 21 U.S.C. section 360bbb-3(b)(1), unless the authorization is terminated or revoked.  Performed at [facility], [place] [address], [place], [place] [address]   MRSA Next Gen by PCR, Nasal     Status: None   Collection Time: 02/20/22 10:00 PM  Specimen: Nasal Mucosa; Nasal Swab  Result Value Ref Range Status   MRSA by PCR Next Gen NOT DETECTED NOT DETECTED Final    Comment: (NOTE) The GeneXpert MRSA Assay (FDA approved for NASAL specimens only), is one component of a comprehensive MRSA colonization surveillance program. It is not intended to diagnose MRSA infection nor to guide or monitor treatment for MRSA infections. Test performance is not FDA approved in patients less than 47 years old. Performed at [facility], [place] [address], [place], [place] [address]     Labs: CBC: Recent Labs  Lab 06/11/22 0025 06/12/22 8403 06/13/22 0518 06/13/22 2023 06/14/22 0549  WBC 13.6* 7.4 4.8 6.0 4.6  NEUTROABS 11.2*  --   --   --   --   HGB 8.8* 7.8* 6.5* 8.4* 7.5*  HCT 28.7* 25.9* 21.9* 27.1* 24.2*  MCV 91.1 93.2 94.0 90.9 89.6  PLT 240 156 136* 161 754*   Basic Metabolic Panel: Recent Labs  Lab 06/11/22 0025 06/12/22 0638 06/13/22 0518 06/14/22 0648  NA 137 142 141 138  K 3.6 3.6 3.4* 3.5  CL 109 115* 117* 112*  CO2 19* 21* 20* 20*  GLUCOSE 132* 84 91 92  BUN '10 8 9 6  '$ CREATININE 0.78 0.69 0.64 0.67  CALCIUM 8.7* 8.7* 8.4* 8.2*   Liver Function Tests: Recent Labs  Lab 06/11/22 0025 06/12/22 0638  AST 15 13*  ALT 11 11  ALKPHOS 54 47  BILITOT 0.9 0.8  PROT 7.6 6.4*  ALBUMIN 3.7 3.1*   CBG: No results for input(s): "GLUCAP" in the last 168 hours.  Discharge time spent: greater than 30 minutes.  Signed: Elmarie Shiley, MD [facility] 06/14/2022

## 2022-06-14 NOTE — Progress Notes (Signed)
PROGRESS NOTE    Robin Guerrero  YJE:[id number] DOB: 02-25-1986 DOA: 06/10/2022 PCP: Trey Sailors, PA   Brief Narrative: 36 year old with past medical history significant for iron deficiency anemia, EtOH abuse, type B aortic dissection, hypertension, chronic pancreatitis presented with abdominal pain, nausea and vomiting.  She was admitted for pancreatitis last week.  She was okay the first day after she got home.  The day prior to admission she developed nausea vomiting.  She has not had any further alcohol intake.  She was also complaining of epigastric abdominal pain.  She was evaluated by GI who recommended pancreatic enzyme and advance diet as tolerated. Patient subsequently developed hematemesis.  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  GI recommended supportive care.  She will need to follow-up by interventional radiology team after discharge.  Patient to follow-up with Dr. Rush Landmark in July to discuss cyst gastrostomy  Assessment & Plan:   Principal Problem:   Acute on chronic pancreatitis [facility] Active Problems:   GERD without esophagitis   History of alcohol abuse   Resistant hypertension   Aortic dissection (HCC)   Acute pancreatitis   Iron deficiency anemia   1-Acute on Chronic Pancreatitis: Continue with IV fluids, but will decrease the rate. He had 2 recent CT last week which show continued pseudocyst.  [facility] recommended pancreatic enzyme, and follow-up as an outpatient. Denies worsening abdominal pain.  Continue with soft, diet.  Patient will need to follow-up with Dr. Rush Landmark  to discuss cyst gastrostomy.  2-GI bleed; She reported vomiting 300 cc dark blood and food 6/28 Hb down to 6.5. Received one unit  PRBC> 6/29 Hb increase to 7.5 plan to transfuse another unit PRBC>  IV PPI Reconsulted, patient underwent endoscopy 6/29; which show type I isolated gastric varices, gastritis, normal duodenal  bulb. Patient was previously evaluated by interventional radiology and was deemed high risk because of type B aortic dissection.  GI is recommending patient to follow-up with IR as an outpatient.  Hypertension: Norvasc and Cozaar held this morning due to soft systolic blood pressure in the 110. Continue with clonidine, but hold the parameter for systolic blood pressure less than 130.  3-GERD: Continue with PPI  Descending thoracic aortic dissection: Continue with outpatient follow-up.  Blood pressure control  EtOH: Denies drinking alcohol  Iron deficiency anemia: Continue to monitor. Getting blood transfusion.  Recent GI bleed: Endoscopy showed possible gastric varix versus gastritis: Continue with PPI  Hypokalemia; Replaced.    Estimated body mass index is 29.53 kg/m as calculated from the following:   Height as of this encounter: '5\' 9"'$  (1.753 m).   Weight as of this encounter: 90.7 kg.   DVT prophylaxis: SCD, recent GI bleed.  Code Status: Full code Family Communication: Care discussed with patient.  Disposition Plan:  Status is: Observation The patient remains OBS appropriate and will d/c before 2 midnights.    Consultants:  GI  Procedures:  None  Antimicrobials:    Subjective: She report no further vomiting. No worsening abdominal pain.   Objective: Vitals:   06/14/22 1420 06/14/22 1430 06/14/22 1450 06/14/22 1454  BP: (!) 168/93 (!) 176/99 (!) 169/107 (!) 172/106  Pulse: 92 89 84 85  Resp: (!) 27 (!) '23 19 18  '$ Temp: 98.2 F (36.8 C)     TempSrc: Temporal     SpO2: 100% 99% 98% 100%  Weight:      Height:  Intake/Output Summary (Last 24 hours) at 06/14/2022 1546 Last data filed at 06/14/2022 1500 Gross per 24 hour  Intake 553.5 ml  Output --  Net 553.5 ml    Filed Weights   06/11/22 0002  Weight: 90.7 kg    Examination:  General exam: NAD Respiratory system: CTA Cardiovascular system; S 1, S 2 RRR Gastrointestinal system: BS  present, soft, nt Central nervous system: Alert, follows command Extremities: No edema   Data Reviewed: I have personally reviewed following labs and imaging studies  CBC: Recent Labs  Lab 06/11/22 0025 06/12/22 0638 06/13/22 0518 06/13/22 2023 06/14/22 0549  WBC 13.6* 7.4 4.8 6.0 4.6  NEUTROABS 11.2*  --   --   --   --   HGB 8.8* 7.8* 6.5* 8.4* 7.5*  HCT 28.7* 25.9* 21.9* 27.1* 24.2*  MCV 91.1 93.2 94.0 90.9 89.6  PLT 240 156 136* 161 144*    Basic Metabolic Panel: Recent Labs  Lab 06/11/22 0025 06/12/22 0638 06/13/22 0518 06/14/22 0648  NA 137 142 141 138  K 3.6 3.6 3.4* 3.5  CL 109 115* 117* 112*  CO2 19* 21* 20* 20*  GLUCOSE 132* 84 91 92  BUN '10 8 9 6  '$ CREATININE 0.78 0.69 0.64 0.67  CALCIUM 8.7* 8.7* 8.4* 8.2*    GFR: Estimated Creatinine Clearance: 116.6 mL/min (by C-G formula based on SCr of 0.67 mg/dL). Liver Function Tests: Recent Labs  Lab 06/11/22 0025 06/12/22 0638  AST 15 13*  ALT 11 11  ALKPHOS 54 47  BILITOT 0.9 0.8  PROT 7.6 6.4*  ALBUMIN 3.7 3.1*    Recent Labs  Lab 06/11/22 0025  LIPASE 328*    No results for input(s): "AMMONIA" in the last 168 hours. Coagulation Profile: No results for input(s): "INR", "PROTIME" in the last 168 hours. Cardiac Enzymes: No results for input(s): "CKTOTAL", "CKMB", "CKMBINDEX", "TROPONINI" in the last 168 hours. BNP (last 3 results) No results for input(s): "PROBNP" in the last 8760 hours. HbA1C: No results for input(s): "HGBA1C" in the last 72 hours. CBG: No results for input(s): "GLUCAP" in the last 168 hours. Lipid Profile: No results for input(s): "CHOL", "HDL", "LDLCALC", "TRIG", "CHOLHDL", "LDLDIRECT" in the last 72 hours. Thyroid Function Tests: No results for input(s): "TSH", "T4TOTAL", "FREET4", "T3FREE", "THYROIDAB" in the last 72 hours. Anemia Panel: No results for input(s): "VITAMINB12", "FOLATE", "FERRITIN", "TIBC", "IRON", "RETICCTPCT" in the last 72 hours. Sepsis Labs: No  results for input(s): "PROCALCITON", "LATICACIDVEN" in the last 168 hours.  No results found for this or any previous visit (from the past 240 hour(s)).       Radiology Studies: No results found.      Scheduled Meds:  amLODipine  10 mg Oral Daily   cloNIDine  0.2 mg Oral Q8H   lipase/protease/amylase  36,000 Units Oral TID AC   losartan  100 mg Oral Daily   pantoprazole (PROTONIX) IV  40 mg Intravenous Q12H   polyethylene glycol  17 g Oral Daily   propranolol  10 mg Oral BID   vitamin B-12  1,000 mcg Oral Daily   Continuous Infusions:  sodium chloride 100 mL/hr at 06/13/22 2003     LOS: 2 days    Time spent: 35 minutes    Veneta Sliter A Peirce Deveney, MD [facility]   If 7PM-7AM, please contact night-coverage [email or web address]  06/14/2022, 3:46 PM

## 2022-06-14 NOTE — Anesthesia Preprocedure Evaluation (Signed)
Anesthesia Evaluation  Patient identified by MRN, date of birth, ID band Patient awake    Reviewed: Allergy & Precautions, NPO status , Patient's Chart, lab work & pertinent test results, reviewed documented beta blocker date and time   Airway Mallampati: III  TM Distance: >3 FB Neck ROM: Full    Dental  (+) Teeth Intact, Dental Advisory Given   Pulmonary Current Smoker and Patient abstained from smoking.,    Pulmonary exam normal breath sounds clear to auscultation       Cardiovascular hypertension, Pt. on home beta blockers and Pt. on medications + Peripheral Vascular Disease (Descending thoracic aortic dissection )  Normal cardiovascular exam Rhythm:Regular Rate:Normal     Neuro/Psych PSYCHIATRIC DISORDERS Depression negative neurological ROS     GI/Hepatic GERD  Medicated,Chronic recurrent pancreatitis hematemesis   Endo/Other  negative endocrine ROS  Renal/GU negative Renal ROS     Musculoskeletal negative musculoskeletal ROS (+)   Abdominal   Peds  Hematology  (+) Blood dyscrasia, anemia ,   Anesthesia Other Findings Day of surgery medications reviewed with the patient.  Reproductive/Obstetrics                             Anesthesia Physical  Anesthesia Plan  ASA: 3  Anesthesia Plan: MAC   Post-op Pain Management: Minimal or no pain anticipated   Induction: Intravenous  PONV Risk Score and Plan: 1 and TIVA and Treatment may vary due to age or medical condition  Airway Management Planned: Nasal Cannula and Natural Airway  Additional Equipment:   Intra-op Plan:   Post-operative Plan:   Informed Consent: I have reviewed the patients History and Physical, chart, labs and discussed the procedure including the risks, benefits and alternatives for the proposed anesthesia with the patient or authorized representative who has indicated his/her understanding and acceptance.      Dental advisory given  Plan Discussed with: CRNA and Anesthesiologist  Anesthesia Plan Comments:         Anesthesia Quick Evaluation

## 2022-06-15 LAB — TYPE AND SCREEN
ABO/RH(D): A POS
Antibody Screen: NEGATIVE
Unit division: 0
Unit division: 0

## 2022-06-15 LAB — BPAM RBC
Blood Product Expiration Date: 202307182359
Blood Product Expiration Date: 202307202359
ISSUE DATE / TIME: 202306290932
ISSUE DATE / TIME: 202306300954
Unit Type and Rh: 6200
Unit Type and Rh: 6200

## 2022-06-17 NOTE — Anesthesia Postprocedure Evaluation (Signed)
Anesthesia Post Note  Patient: Robin Guerrero  Procedure(s) Performed: ESOPHAGOGASTRODUODENOSCOPY (EGD)     Patient location during evaluation: PACU Anesthesia Type: MAC Level of consciousness: awake and alert Pain management: pain level controlled Vital Signs Assessment: post-procedure vital signs reviewed and stable Respiratory status: spontaneous breathing, nonlabored ventilation, respiratory function stable and patient connected to nasal cannula oxygen Cardiovascular status: stable and blood pressure returned to baseline Postop Assessment: no apparent nausea or vomiting Anesthetic complications: no   No notable events documented.  Last Vitals:  Vitals:   06/14/22 1450 06/14/22 1454  BP: (!) 169/107 (!) 172/106  Pulse: 84 85  Resp: 19 18  Temp:    SpO2: 98% 100%    Last Pain:  Vitals:   06/14/22 1454  TempSrc:   PainSc: 0-No pain   Pain Goal: Patients Stated Pain Goal: 0 (06/12/22 1313)                 Jericka Kadar

## 2022-06-25 ENCOUNTER — Ambulatory Visit: Payer: 59 | Admitting: Vascular Surgery

## 2022-06-27 ENCOUNTER — Ambulatory Visit (HOSPITAL_COMMUNITY): Payer: 59

## 2022-07-02 ENCOUNTER — Other Ambulatory Visit: Payer: Self-pay

## 2022-07-02 DIAGNOSIS — I71012 Dissection of descending thoracic aorta: Secondary | ICD-10-CM

## 2022-07-04 ENCOUNTER — Ambulatory Visit (HOSPITAL_COMMUNITY)
Admission: RE | Admit: 2022-07-04 | Discharge: 2022-07-04 | Disposition: A | Discharge status: Home / Self Care | Payer: 59 | Source: Ambulatory Visit | Attending: Vascular Surgery | Admitting: Vascular Surgery

## 2022-07-04 DIAGNOSIS — I71019 Dissection of thoracic aorta, unspecified: Secondary | ICD-10-CM | POA: Insufficient documentation

## 2022-07-04 MED ORDER — IOHEXOL 350 MG/ML SOLN
100.0000 mL | Freq: Once | INTRAVENOUS | Status: AC | PRN
  Administered 2022-07-04: 100 mL via INTRAVENOUS

## 2022-07-05 ENCOUNTER — Other Ambulatory Visit: Payer: 59

## 2022-07-05 ENCOUNTER — Encounter: Payer: Self-pay | Admitting: Gastroenterology

## 2022-07-05 ENCOUNTER — Ambulatory Visit (INDEPENDENT_AMBULATORY_CARE_PROVIDER_SITE_OTHER): Payer: 59 | Admitting: Gastroenterology

## 2022-07-05 VITALS — BP 106/72 | HR 110 | Ht 69.0 in | Wt 188.0 lb

## 2022-07-05 DIAGNOSIS — I81 Portal vein thrombosis: Secondary | ICD-10-CM

## 2022-07-05 DIAGNOSIS — I864 Gastric varices: Secondary | ICD-10-CM

## 2022-07-05 DIAGNOSIS — K861 Other chronic pancreatitis: Secondary | ICD-10-CM

## 2022-07-05 DIAGNOSIS — K860 Alcohol-induced chronic pancreatitis: Secondary | ICD-10-CM | POA: Diagnosis not present

## 2022-07-05 DIAGNOSIS — K760 Fatty (change of) liver, not elsewhere classified: Secondary | ICD-10-CM

## 2022-07-05 DIAGNOSIS — K766 Portal hypertension: Secondary | ICD-10-CM

## 2022-07-05 DIAGNOSIS — F1021 Alcohol dependence, in remission: Secondary | ICD-10-CM | POA: Diagnosis not present

## 2022-07-05 DIAGNOSIS — R1084 Generalized abdominal pain: Secondary | ICD-10-CM

## 2022-07-05 DIAGNOSIS — K863 Pseudocyst of pancreas: Secondary | ICD-10-CM

## 2022-07-05 DIAGNOSIS — G8929 Other chronic pain: Secondary | ICD-10-CM

## 2022-07-05 DIAGNOSIS — R933 Abnormal findings on diagnostic imaging of other parts of digestive tract: Secondary | ICD-10-CM

## 2022-07-05 DIAGNOSIS — I71012 Dissection of descending thoracic aorta: Secondary | ICD-10-CM

## 2022-07-05 MED ORDER — PROMETHAZINE HCL 12.5 MG PO TABS: 12.5000 mg | ORAL_TABLET | Freq: Three times a day (TID) | ORAL | 2 refills | Status: DC | PRN

## 2022-07-05 NOTE — Progress Notes (Signed)
[place] VISIT   Primary Care Provider Trey Sailors, [place]  [id number] [phone]  Referring Provider Trey Sailors, [place],   [id number] [phone]  Patient Profile: Robin Guerrero is a 36 y.o. female with a pmh significant for  The patient presents to the [facility] for an evaluation and management of problem(s) noted below:  Problem List No diagnosis found.  History of Present Illness This is the patient's first visit to the [facility].   The patient does/does not take NSAIDs or BC/Goody Powder. Patient has/has not had an EGD. Patient has/has not had a Colonoscopy.  GI Review of Systems Positive as above Negative for  Pyrosis; Reflux; Regurgitation; Dysphagia; Odynophagia; Globus; Post-prandial cough; Nocturnal cough; Nasal regurgitation; Epigastric pain; Nausea; Vomiting; Hematemesis; Jaundice; Change in Appetite; Early satiety; Abdominal pain; Abdominal bloating; Eructation; Flatulence; Change in BM Frequency; Change in BM Consistency; Constipation; Diarrhea; Incontinence; Urgency; Tenesmus; Hematochezia; Melena  Review of Systems General: Denies fevers/chills/weight loss/night sweats HEENT: Denies oral lesions/sore throat/headaches/visual changes Cardiovascular: Denies chest pain/palpitations Pulmonary: Denies shortness of breath/cough Gastroenterological: See HPI Genitourinary: Denies darkened urine or hematuria Hematological: Denies easy bruising/bleeding Endocrine: Denies temperature intolerance Dermatological: Denies skin changes Psychological: Mood is stable Allergy & Immunology: Denies severe allergic reactions Musculoskeletal: Denies new arthralgias   Medications Current Outpatient Medications  Medication Sig Dispense Refill   amLODipine (NORVASC) 10 MG tablet Take 1 tablet (10 mg total) by mouth daily. 90 tablet 3   cloNIDine  (CATAPRES) 0.2 MG tablet Take 1 tablet (0.2 mg total) by mouth every 8 (eight) hours. 270 tablet 3   ferrous sulfate 325 (65 FE) MG tablet TAKE 1 TABLET BY MOUTH EVERY DAY WITH BREAKFAST (Patient taking differently: Take 325 mg by mouth daily.) 90 tablet 1   hydrALAZINE (APRESOLINE) 100 MG tablet Take 1 tablet (100 mg total) by mouth 3 (three) times daily. 90 tablet 3   lipase/protease/amylase (CREON) 36000 UNITS CPEP capsule Take 1 capsule (36,000 Units total) by mouth 3 (three) times daily before meals. 270 capsule 3   losartan (COZAAR) 100 MG tablet Take 1 tablet (100 mg total) by mouth daily. 30 tablet 2   ondansetron (ZOFRAN) 4 MG tablet Take 1 tablet (4 mg total) by mouth every 8 (eight) hours as needed for nausea or vomiting. 30 tablet 0   pantoprazole (PROTONIX) 40 MG tablet Take 1 tablet (40 mg total) by mouth 2 (two) times daily. 60 tablet 1   polyethylene glycol (MIRALAX / GLYCOLAX) 17 g packet Take 17 g by mouth daily. 14 each 0   propranolol (INDERAL) 10 MG tablet Take 1 tablet (10 mg total) by mouth 2 (two) times daily. 60 tablet 2   vitamin B-12 1000 MCG tablet Take 1 tablet (1,000 mcg total) by mouth daily. 30 tablet 0   No current facility-administered medications for this visit.    Allergies Allergies  Allergen Reactions   Ativan [Lorazepam] Other (See Comments)    Disorientated, combative    Histories Past Medical History:  Diagnosis Date   Descending thoracic dissection ([place])    History of anemia    no current med.   Hypertension    states under control with med., has been on med. x 3 yr.   Pancreatitis    Past Surgical History:  Procedure Laterality Date   ESOPHAGOGASTRODUODENOSCOPY N/A 06/06/2022   Procedure: ESOPHAGOGASTRODUODENOSCOPY (EGD);  Surgeon: Arta Silence, MD;  Location: [facility];  Service: Gastroenterology;  Laterality: N/A;   ESOPHAGOGASTRODUODENOSCOPY N/A 06/14/2022   Procedure: ESOPHAGOGASTRODUODENOSCOPY (EGD);  Surgeon: [name],  MD;  Location: [facility];  Service: Gastroenterology;  Laterality: N/A;   FOREIGN BODY REMOVAL Left 02/10/2015   Procedure: ATTEMPTED EXPLANTATION OF BIRTH CONTROL DEVICE LEFT ARM;  Surgeon: Johnathan Hausen, MD;  Location: [facility] [facility];  Service: General;  Laterality: Left;   FOREIGN BODY REMOVAL Left 05/12/2015   Procedure: REMOVAL OF LEFT ARM BIRTH CONTROL DEVICE;  Surgeon: Johnathan Hausen, MD;  Location: [place];  Service: General;  Laterality: Left;   SUBMANDIBULAR GLAND EXCISION Right 05/01/2009   Social History   Socioeconomic History   Marital status: Single    Spouse name: Not on file   Number of children: 2   Years of education: Not on file   Highest education level: Not on file  Occupational History   Not on file  Tobacco Use   Smoking status: Every Day    Packs/day: 0.25    Years: 5.00    Total pack years: 1.25    Types: Cigarettes   Smokeless tobacco: Never   Tobacco comments:    1 pack/week  Vaping Use   Vaping Use: Never used  Substance and Sexual Activity   Alcohol use: Yes    Comment: occasionally   Drug use: No   Sexual activity: Not Currently  Other Topics Concern   Not on file  Social History Narrative   Not on file   Social Determinants of Health   Financial Resource Strain: Not on file  Food Insecurity: Not on file  Transportation Needs: Not on file  Physical Activity: Not on file  Stress: Not on file  Social Connections: Not on file  Intimate Partner Violence: Not on file   Family History  Problem Relation Age of Onset   Breast cancer Mother 39   Heart disease Mother    Hypertension Mother    I have reviewed her medical, social, and family history in detail and updated the electronic medical record as necessary.    PHYSICAL EXAMINATION  There were no vitals taken for this visit. Wt Readings from Last 3 Encounters:  06/11/22 200 lb (90.7 kg)  06/06/22 194 lb 0.1 oz (88 kg)  04/22/22 209 lb 7 oz (95 kg)    GEN: NAD, appears stated age, doesn't appear chronically ill PSYCH: Cooperative, without pressured speech EYE: Conjunctivae pink, sclerae anicteric ENT: MMM, without oral ulcers, no erythema or exudates noted NECK: Supple CV: RR without R/Gs  RESP: CTAB posteriorly, without wheezing GI: NABS, soft, NT/ND, without rebound or guarding, no HSM appreciated GU: DRE shows MSK/EXT: _ edema, no palmar erythema SKIN: No jaundice, no spider angiomata, no concerning rashes NEURO:  Alert & Oriented x 3, no focal deficits, no evidence of asterixis   REVIEW OF DATA  I reviewed the following data at the time of this encounter:  GI Procedures and Studies  IMPRESSION: 1. 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 Recommend follow-up every 3 years. This recommendation follows ACR consensus guidelines: White Paper of the ACR Incidental Findings Committee II on Vascular Findings. J Am Coll Radiol 2013; 10:789-794. 2. Improving perihepatic pancreatic pseudocyst compared to 06/03/2022. 3. Changes of chronic pancreatitis without evidence of active inflammation. 4. Additional ancillary findings as above without interval change.  Laboratory Studies  ***  Imaging Studies  ***   ASSESSMENT  Robin Guerrero is a 36 y.o. female with a pmh significant for The patient is seen today for evaluation and management of:  No diagnosis found.  ***   PLAN  There are no diagnoses linked to this encounter.   No orders of the defined types were placed in this encounter.   New Prescriptions   No medications on file   Modified Medications   No medications on file    Planned Follow Up No follow-ups on file.   Total Time in Face-to-Face and in Coordination of Care for patient including independent/personal interpretation/review of  prior testing, medical history, examination, medication adjustment, communicating results with the patient directly, and documentation within the EHR is ***.   Justice Britain, MD [facility] Office # [phone]

## 2022-07-05 NOTE — Patient Instructions (Signed)
Your provider has requested that you go to the basement level for lab work before leaving today. Press "B" on the elevator. The lab is located at the first door on the left as you exit the elevator.  We have sent the following medications to your pharmacy for you to pick up at your convenience: phenergan.   You have been scheduled for an MRI/MRCP at [facility] on 07/15/22. Your appointment time is 8:00am . Please arrive to admitting (at main entrance of the hospital) 15 minutes prior to your appointment time for registration purposes. Please make certain not to have anything to eat or drink after midngiht. In addition, if you have any metal in your body, have a pacemaker or defibrillator, please be sure to let your ordering physician know. This test typically takes 45 minutes to 1 hour to complete. Should you need to reschedule, please call [phone] to do so.  The [facility] providers would like to encourage you to use [facility] to communicate with providers for non-urgent requests or questions.  Due to long hold times on the telephone, sending your provider a message by [facility] may be a faster and more efficient way to get a response.  Please allow 48 business hours for a response.  Please remember that this is for non-urgent requests.   Due to recent changes in healthcare laws, you may see the results of your imaging and laboratory studies on MyChart before your provider has had a chance to review them.  We understand that in some cases there may be results that are confusing or concerning to you. Not all laboratory results come back in the same time frame and the provider may be waiting for multiple results in order to interpret others.  Please give [place] 48 hours in order for your provider to thoroughly review all the results before contacting the office for clarification of your results.

## 2022-07-07 ENCOUNTER — Encounter: Payer: Self-pay | Admitting: Gastroenterology

## 2022-07-07 DIAGNOSIS — K860 Alcohol-induced chronic pancreatitis: Secondary | ICD-10-CM | POA: Insufficient documentation

## 2022-07-07 DIAGNOSIS — G8929 Other chronic pain: Secondary | ICD-10-CM | POA: Insufficient documentation

## 2022-07-07 DIAGNOSIS — K766 Portal hypertension: Secondary | ICD-10-CM | POA: Insufficient documentation

## 2022-07-07 DIAGNOSIS — I864 Gastric varices: Secondary | ICD-10-CM | POA: Insufficient documentation

## 2022-07-07 DIAGNOSIS — R933 Abnormal findings on diagnostic imaging of other parts of digestive tract: Secondary | ICD-10-CM | POA: Insufficient documentation

## 2022-07-07 DIAGNOSIS — K760 Fatty (change of) liver, not elsewhere classified: Secondary | ICD-10-CM | POA: Insufficient documentation

## 2022-07-07 DIAGNOSIS — F1021 Alcohol dependence, in remission: Secondary | ICD-10-CM | POA: Insufficient documentation

## 2022-07-07 DIAGNOSIS — I81 Portal vein thrombosis: Secondary | ICD-10-CM | POA: Insufficient documentation

## 2022-07-08 NOTE — Progress Notes (Deleted)
   ASSESSMENT & PLAN:  Robin Guerrero is a 36 y.o. female with stable type B aortic dissection.  Continue medical management with goal systolic blood pressure less than 130.  Goal heart rate less than 80.  Continue annual CTA surveillance to ensure no aneurysmal degeneration.   She was recently admitted to the hospital in mid March for left shoulder pain.  This was concerning in the setting of her known chronic type B aortic dissection.  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  The patient improved with medical therapy and was discharged.  We a long discussion about prophylactic intervention for chronic type B dissection.  I quoted her the limited data available to [place] in this problem.  I explained that stenting could reduce the risk of aneurysmal degeneration and promote healthy remodeling of the thoracic aorta.  This does carry a 5% risk of stroke, which could be debilitating in this young woman.  She is understandably nervous about her chronic type B dissection.  I encouraged her to avoid intervention if at all possible.  We will continue to monitor this in the outpatient setting.  SUBJECTIVE:  Admitted to hospital 02/20/2022 to 02/24/2022 for left shoulder pain.  A CT angiogram was performed showing possibly new intimal flap immediately distal left subclavian artery.  She is now pain-free.  She is understandably anxious about her dissection and the intermittent need to be hospitalized.  OBJECTIVE:  There were no vitals taken for this visit.  Constitutional: well appearing. no acute distress. Cardiac: RRR. Vascular: 2+ DPs Pulmonary: unlabored Abdominal: soft     Latest Ref Rng & Units 06/14/2022    5:49 AM 06/13/2022    8:23 PM 06/13/2022    5:18 AM  CBC  WBC 4.0 - 10.5 K/uL 4.6  6.0  4.8   Hemoglobin 12.0 - 15.0 g/dL 7.5  8.4  6.5   Hematocrit 36.0 - 46.0 % 24.2  27.1  21.9   Platelets 150 - 400 K/uL 144  161  136         Latest Ref  Rng & Units 06/14/2022    6:48 AM 06/13/2022    5:18 AM 06/12/2022    6:38 AM  CMP  Glucose 70 - 99 mg/dL 92  91  84   BUN 6 - 20 mg/dL '6  9  8   '$ Creatinine 0.44 - 1.00 mg/dL 0.67  0.64  0.69   Sodium 135 - 145 mmol/L 138  141  142   Potassium 3.5 - 5.1 mmol/L 3.5  3.4  3.6   Chloride 98 - 111 mmol/L 112  117  115   CO2 22 - 32 mmol/L '20  20  21   '$ Calcium 8.9 - 10.3 mg/dL 8.2  8.4  8.7   Total Protein 6.5 - 8.1 g/dL   6.4   Total Bilirubin 0.3 - 1.2 mg/dL   0.8   Alkaline Phos 38 - 126 U/L   47   AST 15 - 41 U/L   13   ALT 0 - 44 U/L   11     CrCl cannot be calculated (Patient's most recent lab result is older than the maximum 21 days allowed.).  CTA personally reviewed.  The scan appears overall stable. She has mild aneurysmal degeneration about the descending thoracic aorta to 26m.  Robin Aline HStanford Breed MD [facility] LLCPhone Number: [phone]/24/2023 2:17 PM

## 2022-07-09 ENCOUNTER — Ambulatory Visit: Payer: 59 | Admitting: Vascular Surgery

## 2022-07-11 ENCOUNTER — Other Ambulatory Visit: Payer: 59

## 2022-07-11 DIAGNOSIS — K860 Alcohol-induced chronic pancreatitis: Secondary | ICD-10-CM

## 2022-07-11 DIAGNOSIS — K863 Pseudocyst of pancreas: Secondary | ICD-10-CM

## 2022-07-15 ENCOUNTER — Other Ambulatory Visit: Payer: Self-pay | Admitting: Gastroenterology

## 2022-07-15 ENCOUNTER — Ambulatory Visit (HOSPITAL_COMMUNITY)
Admission: RE | Admit: 2022-07-15 | Discharge: 2022-07-15 | Disposition: A | Discharge status: Home / Self Care | Payer: 59 | Source: Ambulatory Visit | Attending: Gastroenterology | Admitting: Gastroenterology

## 2022-07-15 DIAGNOSIS — K860 Alcohol-induced chronic pancreatitis: Secondary | ICD-10-CM

## 2022-07-15 DIAGNOSIS — K863 Pseudocyst of pancreas: Secondary | ICD-10-CM | POA: Insufficient documentation

## 2022-07-15 MED ORDER — GADOBUTROL 1 MMOL/ML IV SOLN
8.0000 mL | Freq: Once | INTRAVENOUS | Status: AC | PRN
  Administered 2022-07-15: 8 mL via INTRAVENOUS

## 2022-07-17 ENCOUNTER — Telehealth: Payer: Self-pay

## 2022-07-17 LAB — PANCREATIC ELASTASE, FECAL: Pancreatic Elastase-1, Stool: 66 mcg/g — ABNORMAL LOW

## 2022-07-17 NOTE — Telephone Encounter (Signed)
-----   Message from Irving Copas., MD sent at 07/16/2022 11:59 PM EDT ----- Regarding: RE: Referred patient WO, Thanks for followup.  Agree varices of the gastric variety will make this potentially more difficult if at all possible. Chanae Gemma, you can offer this patient an EGD/EUS celiac blockade attempt with me.  Do it on a date where she is not one of the last patients of the day or block in case she has issues where she would need to be monitored postprocedure or admitted/observed. Thanks. GM ----- Message ----- From: Arta Silence, MD Sent: 07/16/2022  10:12 AM EDT To: Irving Copas., MD Subject: RE: Referred patient                           Sloan Leiter, I'm booked out a bit too,  happy for you to take lead, I myself do celiac blocks but wouldn't feel comfortable trying in her case with various nearby.  Thanks as always for your insight into these challenging procedures and cases ----- Message ----- From: Irving Copas., MD Sent: 07/16/2022   5:34 AM EDT To: Arta Silence, MD; Ronnette Juniper, MD Subject: Referred patient                               AK and WO, Hope you are both well. Sent you all a result note in regards to your patient that had been referred for consideration of cystgastrostomy. Thankfully, it does not look like she has any pancreatic duct abnormalities at this point that would require pancreatic ERCP from my standpoint. I think the options for her would be the following: 1) consideration of celiac plexus blockade in an effort of seeing if that may be helpful for her pain or not 2) referral to chronic pain management and effort of getting her chronic pain controlled 3) she does not seem to be tolerating the oral pancreas enzyme replacement therapy, consider switching her from Creon to Zenpep 4) quaternary referral to see if anything else can be considered 5) repeat MRI/MRCP 6 months to follow-up persists 6) continuing beta-blockade for gastric  varices is reasonable, this will be a problem for her moving forward unfortunately so it may be more reason for consideration of a quaternary referral  WO, I had discussed celiac blockade as a temporary strategy to see if that could be effective for mild pain management in the interim but I did tell her about the risks as well as the potential that there was no effectiveness that would occur.  Not sure what your availability is for EUS blockade, currently my schedule is out for 6 to 8 weeks before I have an open slot right now.  Also the gastric varix could prevent [place] from even being able to reach the celiac axis.  She was aware that I will discuss with you all next steps.  Appreciate your thoughts on this referral.  GM

## 2022-07-19 NOTE — Telephone Encounter (Signed)
Mansouraty, Telford Nab., MD  Timothy Lasso, RN Cc: Ronnette Juniper, MD Dejan Angert,  Please let patient know that her fecal elastase is low.  If she cannot tolerate the Creon, even if it is not helping with her pain, it looks like she has reasons to have enzyme replacement therapy on board.  I will forward this to her primary gastroenterologist Dr. Therisa Doyne for her records.  GM

## 2022-07-22 ENCOUNTER — Other Ambulatory Visit: Payer: Self-pay

## 2022-07-22 DIAGNOSIS — K860 Alcohol-induced chronic pancreatitis: Secondary | ICD-10-CM

## 2022-07-22 DIAGNOSIS — K863 Pseudocyst of pancreas: Secondary | ICD-10-CM

## 2022-07-22 NOTE — Telephone Encounter (Signed)
EUS EGD Celiac plexus block scheduled, pt instructed and medications reviewed.  Patient instructions mailed to home.  Patient to call with any questions or concerns.

## 2022-07-22 NOTE — Telephone Encounter (Signed)
EGD EUS Celiac 09/11/22 at 1015 am at [facility] with GM

## 2022-07-30 ENCOUNTER — Emergency Department (HOSPITAL_COMMUNITY): Payer: 59

## 2022-07-30 ENCOUNTER — Inpatient Hospital Stay (HOSPITAL_COMMUNITY)
Admission: EM | Admit: 2022-07-30 | Discharge: 2022-08-02 | DRG: 438 | Disposition: A | Discharge status: Home / Self Care | Payer: 59 | Attending: Internal Medicine | Admitting: Internal Medicine

## 2022-07-30 ENCOUNTER — Other Ambulatory Visit: Payer: Self-pay

## 2022-07-30 ENCOUNTER — Encounter (HOSPITAL_COMMUNITY): Payer: Self-pay

## 2022-07-30 DIAGNOSIS — K449 Diaphragmatic hernia without obstruction or gangrene: Secondary | ICD-10-CM | POA: Diagnosis present

## 2022-07-30 DIAGNOSIS — K859 Acute pancreatitis without necrosis or infection, unspecified: Secondary | ICD-10-CM | POA: Diagnosis not present

## 2022-07-30 DIAGNOSIS — K863 Pseudocyst of pancreas: Secondary | ICD-10-CM | POA: Diagnosis present

## 2022-07-30 DIAGNOSIS — R109 Unspecified abdominal pain: Secondary | ICD-10-CM | POA: Diagnosis not present

## 2022-07-30 DIAGNOSIS — K922 Gastrointestinal hemorrhage, unspecified: Secondary | ICD-10-CM | POA: Diagnosis present

## 2022-07-30 DIAGNOSIS — Z888 Allergy status to other drugs, medicaments and biological substances status: Secondary | ICD-10-CM

## 2022-07-30 DIAGNOSIS — E876 Hypokalemia: Secondary | ICD-10-CM | POA: Diagnosis present

## 2022-07-30 DIAGNOSIS — K861 Other chronic pancreatitis: Secondary | ICD-10-CM | POA: Diagnosis present

## 2022-07-30 DIAGNOSIS — I10 Essential (primary) hypertension: Secondary | ICD-10-CM | POA: Diagnosis present

## 2022-07-30 DIAGNOSIS — I71012 Dissection of descending thoracic aorta: Secondary | ICD-10-CM | POA: Diagnosis present

## 2022-07-30 DIAGNOSIS — E878 Other disorders of electrolyte and fluid balance, not elsewhere classified: Secondary | ICD-10-CM | POA: Diagnosis present

## 2022-07-30 DIAGNOSIS — R000 Tachycardia, unspecified: Secondary | ICD-10-CM | POA: Diagnosis not present

## 2022-07-30 DIAGNOSIS — I864 Gastric varices: Secondary | ICD-10-CM | POA: Diagnosis present

## 2022-07-30 DIAGNOSIS — F17210 Nicotine dependence, cigarettes, uncomplicated: Secondary | ICD-10-CM | POA: Diagnosis present

## 2022-07-30 DIAGNOSIS — K920 Hematemesis: Secondary | ICD-10-CM | POA: Diagnosis present

## 2022-07-30 DIAGNOSIS — Z8249 Family history of ischemic heart disease and other diseases of the circulatory system: Secondary | ICD-10-CM

## 2022-07-30 DIAGNOSIS — I160 Hypertensive urgency: Secondary | ICD-10-CM | POA: Diagnosis not present

## 2022-07-30 DIAGNOSIS — Z79899 Other long term (current) drug therapy: Secondary | ICD-10-CM

## 2022-07-30 DIAGNOSIS — K219 Gastro-esophageal reflux disease without esophagitis: Secondary | ICD-10-CM | POA: Diagnosis present

## 2022-07-30 DIAGNOSIS — K860 Alcohol-induced chronic pancreatitis: Secondary | ICD-10-CM

## 2022-07-30 LAB — I-STAT BETA HCG BLOOD, ED (MC, WL, AP ONLY): I-stat hCG, quantitative: <5 m[IU]/mL (ref <5)

## 2022-07-30 LAB — COMPREHENSIVE METABOLIC PANEL
ALT: 13 U/L (ref 0–44)
AST: 13 U/L — ABNORMAL LOW (ref 15–41)
Albumin: 3.5 g/dL (ref 3.5–5.0)
Alkaline Phosphatase: 56 U/L (ref 38–126)
Anion gap: 7 (ref 5–15)
BUN: 15 mg/dL (ref 6–20)
CO2: 18 mmol/L — ABNORMAL LOW (ref 22–32)
Calcium: 8.9 mg/dL (ref 8.9–10.3)
Chloride: 116 mmol/L — ABNORMAL HIGH (ref 98–111)
Creatinine, Ser: 0.82 mg/dL (ref 0.44–1.00)
GFR, Estimated: >60 mL/min (ref >60)
Glucose, Bld: 108 mg/dL — ABNORMAL HIGH (ref 70–99)
Potassium: 3.4 mmol/L — ABNORMAL LOW (ref 3.5–5.1)
Sodium: 141 mmol/L (ref 135–145)
Total Bilirubin: 1.1 mg/dL (ref 0.3–1.2)
Total Protein: 7.1 g/dL (ref 6.5–8.1)

## 2022-07-30 LAB — CBC
HCT: 31.6 % — ABNORMAL LOW (ref 36.0–46.0)
Hemoglobin: 10.1 g/dL — ABNORMAL LOW (ref 12.0–15.0)
MCH: 28.3 pg (ref 26.0–34.0)
MCHC: 32 g/dL (ref 30.0–36.0)
MCV: 88.5 fL (ref 80.0–100.0)
Platelets: 244 10*3/uL (ref 150–400)
RBC: 3.57 MIL/uL — ABNORMAL LOW (ref 3.87–5.11)
RDW: 22.5 % — ABNORMAL HIGH (ref 11.5–15.5)
WBC: 10.8 10*3/uL — ABNORMAL HIGH (ref 4.0–10.5)
nRBC: 0 % (ref 0.0–0.2)

## 2022-07-30 LAB — URINALYSIS, ROUTINE W REFLEX MICROSCOPIC
Bacteria, UA: NONE SEEN
Bilirubin Urine: NEGATIVE
Glucose, UA: NEGATIVE mg/dL
Ketones, ur: 5 mg/dL — AB
Leukocytes,Ua: NEGATIVE
Nitrite: NEGATIVE
Protein, ur: 30 mg/dL — AB
Specific Gravity, Urine: 1.032 — ABNORMAL HIGH (ref 1.005–1.030)
pH: 5 (ref 5.0–8.0)

## 2022-07-30 LAB — TYPE AND SCREEN
ABO/RH(D): A POS
Antibody Screen: NEGATIVE

## 2022-07-30 LAB — LIPASE, BLOOD: Lipase: 139 U/L — ABNORMAL HIGH (ref 11–51)

## 2022-07-30 LAB — MAGNESIUM: Magnesium: 1.7 mg/dL (ref 1.7–2.4)

## 2022-07-30 MED ORDER — IOHEXOL 300 MG/ML  SOLN
100.0000 mL | Freq: Once | INTRAMUSCULAR | Status: AC | PRN
  Administered 2022-07-30: 100 mL via INTRAVENOUS

## 2022-07-30 MED ORDER — SODIUM CHLORIDE 0.9 % IV SOLN
1.0000 g | Freq: Once | INTRAVENOUS | Status: AC
  Administered 2022-07-30: 1 g via INTRAVENOUS
  Filled 2022-07-30: qty 10

## 2022-07-30 MED ORDER — PANTOPRAZOLE SODIUM 40 MG IV SOLR
40.0000 mg | Freq: Two times a day (BID) | INTRAVENOUS | Status: DC
  Administered 2022-07-30 – 2022-08-01 (×5): 40 mg via INTRAVENOUS
  Filled 2022-07-30 (×7): qty 10

## 2022-07-30 MED ORDER — HYDROMORPHONE HCL 2 MG/ML IJ SOLN
1.0000 mg | Freq: Once | INTRAMUSCULAR | Status: AC
  Administered 2022-07-30: 1 mg via INTRAVENOUS
  Filled 2022-07-30: qty 1

## 2022-07-30 MED ORDER — LACTATED RINGERS IV BOLUS
1000.0000 mL | Freq: Once | INTRAVENOUS | Status: AC
  Administered 2022-07-30: 1000 mL via INTRAVENOUS

## 2022-07-30 MED ORDER — CLONIDINE HCL 0.1 MG PO TABS
0.2000 mg | ORAL_TABLET | Freq: Once | ORAL | Status: AC
  Administered 2022-07-30: 0.2 mg via ORAL
  Filled 2022-07-30: qty 2

## 2022-07-30 MED ORDER — METOCLOPRAMIDE HCL 5 MG/ML IJ SOLN
10.0000 mg | Freq: Once | INTRAMUSCULAR | Status: AC | PRN
  Administered 2022-07-30: 10 mg via INTRAVENOUS
  Filled 2022-07-30: qty 2

## 2022-07-30 MED ORDER — PROPRANOLOL HCL 10 MG PO TABS
10.0000 mg | ORAL_TABLET | Freq: Two times a day (BID) | ORAL | Status: DC
  Administered 2022-07-30 – 2022-08-02 (×5): 10 mg via ORAL
  Filled 2022-07-30 (×6): qty 1

## 2022-07-30 MED ORDER — AMLODIPINE BESYLATE 10 MG PO TABS
10.0000 mg | ORAL_TABLET | Freq: Every day | ORAL | Status: DC
  Administered 2022-07-30 – 2022-08-02 (×4): 10 mg via ORAL
  Filled 2022-07-30: qty 1
  Filled 2022-07-30 (×2): qty 2
  Filled 2022-07-30 (×2): qty 1

## 2022-07-30 MED ORDER — HYDROMORPHONE HCL 1 MG/ML IJ SOLN
1.0000 mg | Freq: Once | INTRAMUSCULAR | Status: AC
  Administered 2022-07-30: 1 mg via INTRAVENOUS
  Filled 2022-07-30: qty 1

## 2022-07-30 MED ORDER — LOSARTAN POTASSIUM 50 MG PO TABS
100.0000 mg | ORAL_TABLET | Freq: Every day | ORAL | Status: DC
  Administered 2022-07-30 – 2022-08-02 (×4): 100 mg via ORAL
  Filled 2022-07-30: qty 2
  Filled 2022-07-30: qty 4
  Filled 2022-07-30: qty 2
  Filled 2022-07-30: qty 4
  Filled 2022-07-30: qty 2

## 2022-07-30 NOTE — ED Provider Notes (Signed)
[facility] DEPT Provider Note   CSN: [id number] Arrival date & time: 07/30/22  1633     History {Add pertinent medical, surgical, social history, OB history to HPI:1} Chief Complaint  Patient presents with   Abdominal Pain   Hematemesis    Robin Guerrero is a 36 y.o. female.   Abdominal Pain Patient presents for abdominal pain and vomiting.  She describes***.  Medical history includes descending thoracic dissection, anemia, HTN, pancreatitis, alcohol abuse, GERD, portal vein thrombosis, gastric varices.  She last underwent EGD on 6/30 with Dr. Alessandra Bevels.  At that time, she had evidence of gastritis and type I isolated gastric varices without bleeding.***. 11am. Off of protonix and creon. On alt enzyme replacement. Sober x 1 year. Emesis x 6-7. All bloody. Dark blood.      Home Medications Prior to Admission medications   Medication Sig Start Date End Date Taking? Authorizing Provider  amLODipine (NORVASC) 10 MG tablet Take 1 tablet (10 mg total) by mouth daily. 03/08/22   Patwardhan, Reynold Bowen, MD  cloNIDine (CATAPRES) 0.2 MG tablet Take 1 tablet (0.2 mg total) by mouth every 8 (eight) hours. 03/08/22 06/12/23  Patwardhan, Reynold Bowen, MD  ferrous sulfate 325 (65 FE) MG tablet TAKE 1 TABLET BY MOUTH EVERY DAY WITH BREAKFAST Patient taking differently: Take 325 mg by mouth daily. 03/25/22   Patwardhan, Reynold Bowen, MD  hydrALAZINE (APRESOLINE) 100 MG tablet Take 1 tablet (100 mg total) by mouth 3 (three) times daily. 03/08/22 03/08/23  Patwardhan, Reynold Bowen, MD  lipase/protease/amylase (CREON) 36000 UNITS CPEP capsule Take 1 capsule (36,000 Units total) by mouth 3 (three) times daily before meals. 06/15/22   Regalado, Belkys A, MD  losartan (COZAAR) 100 MG tablet Take 1 tablet (100 mg total) by mouth daily. 06/08/22 09/06/22  Barb Merino, MD  ondansetron (ZOFRAN) 4 MG tablet Take 1 tablet (4 mg total) by mouth every 8 (eight) hours as needed for nausea or  vomiting. 06/08/22   Barb Merino, MD  pantoprazole (PROTONIX) 40 MG tablet Take 1 tablet (40 mg total) by mouth 2 (two) times daily. Patient not taking: Reported on 07/05/2022 06/14/22 06/14/23  Regalado, Jerald Kief A, MD  polyethylene glycol (MIRALAX / GLYCOLAX) 17 g packet Take 17 g by mouth daily. 06/15/22   Regalado, Belkys A, MD  promethazine (PHENERGAN) 12.5 MG tablet Take 1 tablet (12.5 mg total) by mouth every 8 (eight) hours as needed for nausea or vomiting. 07/05/22   Mansouraty, Telford Nab., MD  propranolol (INDERAL) 10 MG tablet Take 1 tablet (10 mg total) by mouth 2 (two) times daily. 06/08/22 09/06/22  Barb Merino, MD  vitamin B-12 1000 MCG tablet Take 1 tablet (1,000 mcg total) by mouth daily. 06/15/22   Regalado, Cassie Freer, MD      Allergies    Ativan [lorazepam]    Review of Systems   Review of Systems  Gastrointestinal:  Positive for abdominal pain.    Physical Exam Updated Vital Signs BP (!) 160/108 (BP Location: Left Arm)   Pulse (!) 130   Temp 99.9 F (37.7 C)   Resp 20   Ht '5\' 8"'$  (1.727 m)   Wt 82.1 kg   SpO2 100%   BMI 27.52 kg/m  Physical Exam  ED Results / Procedures / Treatments   Labs (all labs ordered are listed, but only abnormal results are displayed) Labs Reviewed  LIPASE, BLOOD  COMPREHENSIVE METABOLIC PANEL  CBC  URINALYSIS, ROUTINE W REFLEX MICROSCOPIC  I-STAT BETA HCG BLOOD,  ED (MC, WL, AP ONLY)    EKG None  Radiology No results found.  Procedures Procedures  {Document cardiac monitor, telemetry assessment procedure when appropriate:1}  Medications Ordered in ED Medications - No data to display  ED Course/ Medical Decision Making/ A&P                           Medical Decision Making Amount and/or Complexity of Data Reviewed Labs: ordered.  Risk Prescription drug management.   ***  {Document critical care time when appropriate:1} {Document review of labs and clinical decision tools ie heart score, Chads2Vasc2 etc:1}   {Document your independent review of radiology images, and any outside records:1} {Document your discussion with family members, caretakers, and with consultants:1} {Document social determinants of health affecting pt's care:1} {Document your decision making why or why not admission, treatments were needed:1} Final Clinical Impression(s) / ED Diagnoses Final diagnoses:  None    Rx / DC Orders ED Discharge Orders     None

## 2022-07-30 NOTE — ED Triage Notes (Signed)
Pt reports severe abdominal pain and vomiting blood that began today. Pt reports hx of pancreatitis.

## 2022-07-30 NOTE — ED Notes (Signed)
Patient transported to CT 

## 2022-07-31 ENCOUNTER — Encounter (HOSPITAL_COMMUNITY): Payer: Self-pay | Admitting: Internal Medicine

## 2022-07-31 ENCOUNTER — Observation Stay
Admission: RE | Admit: 2022-07-31 | Discharge: 2022-07-31 | Disposition: A | Discharge status: Home / Self Care | Payer: 59 | Source: Ambulatory Visit | Attending: Radiology | Admitting: Radiology

## 2022-07-31 ENCOUNTER — Encounter (HOSPITAL_COMMUNITY): Payer: Self-pay | Admitting: Anesthesiology

## 2022-07-31 ENCOUNTER — Encounter (HOSPITAL_COMMUNITY)
Admission: EM | Disposition: A | Discharge status: Home / Self Care | Payer: Self-pay | Source: Home / Self Care | Attending: Internal Medicine

## 2022-07-31 DIAGNOSIS — K920 Hematemesis: Secondary | ICD-10-CM

## 2022-07-31 DIAGNOSIS — E876 Hypokalemia: Secondary | ICD-10-CM

## 2022-07-31 DIAGNOSIS — I10 Essential (primary) hypertension: Secondary | ICD-10-CM

## 2022-07-31 DIAGNOSIS — I71012 Dissection of descending thoracic aorta: Secondary | ICD-10-CM

## 2022-07-31 DIAGNOSIS — K859 Acute pancreatitis without necrosis or infection, unspecified: Secondary | ICD-10-CM | POA: Diagnosis not present

## 2022-07-31 DIAGNOSIS — K219 Gastro-esophageal reflux disease without esophagitis: Secondary | ICD-10-CM

## 2022-07-31 DIAGNOSIS — K922 Gastrointestinal hemorrhage, unspecified: Secondary | ICD-10-CM | POA: Diagnosis not present

## 2022-07-31 LAB — MAGNESIUM: Magnesium: 1.7 mg/dL (ref 1.7–2.4)

## 2022-07-31 LAB — APTT: aPTT: 30 seconds (ref 24–36)

## 2022-07-31 LAB — COMPREHENSIVE METABOLIC PANEL
ALT: 13 U/L (ref 0–44)
AST: 12 U/L — ABNORMAL LOW (ref 15–41)
Albumin: 3.2 g/dL — ABNORMAL LOW (ref 3.5–5.0)
Alkaline Phosphatase: 52 U/L (ref 38–126)
Anion gap: 6 (ref 5–15)
BUN: 13 mg/dL (ref 6–20)
CO2: 21 mmol/L — ABNORMAL LOW (ref 22–32)
Calcium: 8.7 mg/dL — ABNORMAL LOW (ref 8.9–10.3)
Chloride: 115 mmol/L — ABNORMAL HIGH (ref 98–111)
Creatinine, Ser: 0.61 mg/dL (ref 0.44–1.00)
GFR, Estimated: >60 mL/min (ref >60)
Glucose, Bld: 82 mg/dL (ref 70–99)
Potassium: 3.4 mmol/L — ABNORMAL LOW (ref 3.5–5.1)
Sodium: 142 mmol/L (ref 135–145)
Total Bilirubin: 1 mg/dL (ref 0.3–1.2)
Total Protein: 6.8 g/dL (ref 6.5–8.1)

## 2022-07-31 LAB — CBC
HCT: 27.1 % — ABNORMAL LOW (ref 36.0–46.0)
Hemoglobin: 8.6 g/dL — ABNORMAL LOW (ref 12.0–15.0)
MCH: 28.2 pg (ref 26.0–34.0)
MCHC: 31.7 g/dL (ref 30.0–36.0)
MCV: 88.9 fL (ref 80.0–100.0)
Platelets: 192 10*3/uL (ref 150–400)
RBC: 3.05 MIL/uL — ABNORMAL LOW (ref 3.87–5.11)
RDW: 22.5 % — ABNORMAL HIGH (ref 11.5–15.5)
WBC: 6.2 10*3/uL (ref 4.0–10.5)
nRBC: 0 % (ref 0.0–0.2)

## 2022-07-31 LAB — LACTIC ACID, PLASMA: Lactic Acid, Venous: 0.9 mmol/L (ref 0.5–1.9)

## 2022-07-31 LAB — IRON AND TIBC
Iron: 63 ug/dL (ref 28–170)
Saturation Ratios: 22 % (ref 10.4–31.8)
TIBC: 286 ug/dL (ref 250–450)
UIBC: 223 ug/dL

## 2022-07-31 LAB — LIPASE, BLOOD: Lipase: 193 U/L — ABNORMAL HIGH (ref 11–51)

## 2022-07-31 LAB — PROTIME-INR
INR: 1.2 (ref 0.8–1.2)
Prothrombin Time: 15 seconds (ref 11.4–15.2)

## 2022-07-31 SURGERY — CANCELLED PROCEDURE

## 2022-07-31 MED ORDER — VITAMIN B-12 1000 MCG PO TABS
1000.0000 ug | ORAL_TABLET | Freq: Every day | ORAL | Status: DC
  Administered 2022-07-31 – 2022-08-02 (×3): 1000 ug via ORAL
  Filled 2022-07-31 (×4): qty 1

## 2022-07-31 MED ORDER — ENALAPRILAT 1.25 MG/ML IV SOLN: 1.2500 mg | Freq: Four times a day (QID) | INTRAVENOUS | Status: DC | PRN

## 2022-07-31 MED ORDER — ACETAMINOPHEN 650 MG RE SUPP: 650.0000 mg | Freq: Four times a day (QID) | RECTAL | Status: DC | PRN

## 2022-07-31 MED ORDER — POLYETHYLENE GLYCOL 3350 17 G PO PACK: 17.0000 g | PACK | Freq: Every day | ORAL | Status: DC | PRN

## 2022-07-31 MED ORDER — BUTALBITAL-APAP-CAFFEINE 50-325-40 MG PO TABS
1.0000 | ORAL_TABLET | Freq: Once | ORAL | Status: AC
Start: 2022-08-01 — End: 2022-08-01
  Administered 2022-08-01: 1 via ORAL
  Filled 2022-07-31: qty 1

## 2022-07-31 MED ORDER — PANCRELIPASE (LIP-PROT-AMYL) 12000-38000 UNITS PO CPEP
36000.0000 [IU] | ORAL_CAPSULE | Freq: Three times a day (TID) | ORAL | Status: DC
  Administered 2022-07-31 – 2022-08-01 (×3): 36000 [IU] via ORAL
  Filled 2022-07-31: qty 1
  Filled 2022-07-31: qty 3
  Filled 2022-07-31: qty 1
  Filled 2022-07-31: qty 3
  Filled 2022-07-31: qty 1
  Filled 2022-07-31: qty 3

## 2022-07-31 MED ORDER — HYDROCODONE-ACETAMINOPHEN 5-325 MG PO TABS
1.0000 | ORAL_TABLET | Freq: Four times a day (QID) | ORAL | Status: DC | PRN
  Administered 2022-07-31 (×3): 1 via ORAL
  Filled 2022-07-31 (×4): qty 1

## 2022-07-31 MED ORDER — LABETALOL HCL 5 MG/ML IV SOLN
20.0000 mg | INTRAVENOUS | Status: DC | PRN
  Administered 2022-08-01 (×2): 20 mg via INTRAVENOUS
  Filled 2022-07-31 (×4): qty 4

## 2022-07-31 MED ORDER — SODIUM CHLORIDE 0.9 % IV SOLN: INTRAVENOUS | Status: DC

## 2022-07-31 MED ORDER — LACTATED RINGERS IV SOLN: INTRAVENOUS | Status: AC

## 2022-07-31 MED ORDER — ACETAMINOPHEN 325 MG PO TABS: 650.0000 mg | ORAL_TABLET | Freq: Four times a day (QID) | ORAL | Status: DC | PRN

## 2022-07-31 MED ORDER — HYDRALAZINE HCL 50 MG PO TABS
100.0000 mg | ORAL_TABLET | Freq: Three times a day (TID) | ORAL | Status: DC
  Administered 2022-07-31 – 2022-08-02 (×5): 100 mg via ORAL
  Filled 2022-07-31 (×8): qty 2

## 2022-07-31 SURGICAL SUPPLY — 15 items

## 2022-07-31 NOTE — Consult Note (Signed)
Reason for Consult: Upper GI bleed chronic pancreatitis Referring Physician: Hospital team  Robin Guerrero is an 36 y.o. female.  HPI: Patient seen and examined in her office computer chart and her hospital computer chart was reviewed and her case is known to multiple of my partners and she had been in her normal state of health until yesterday when she threw up blood about 6 times without any black diarrhea and no sick contacts and she had not eaten and she is not on any aspirin or nonsteroidals and only occasionally takes Tylenol had no problems with her previous endoscopy unfortunately she did eat some of the sandwich in the ER at 6 AM and anesthesia will not proceed for at least 8 hours unfortunately that would make her an add-on and since she seems very stable without signs of bleeding since she has been here we will plan on proceeding as needed or in a.m. and patient agrees with the plan and she does have a outpatient IR consult today but she does not know why and she has not discussed TIPS or brto  with anyone in the past that she remembers and no liver or pancreas problems run in the family and her pancreatic enzymes tend to give her diarrhea and she has no other complaints  Past Medical History:  Diagnosis Date   Descending thoracic dissection ([place])    History of anemia    no current med.   Hypertension    states under control with med., has been on med. x 3 yr.   Pancreatitis     Past Surgical History:  Procedure Laterality Date   ESOPHAGOGASTRODUODENOSCOPY N/A 06/06/2022   Procedure: ESOPHAGOGASTRODUODENOSCOPY (EGD);  Surgeon: Arta Silence, MD;  Location: [facility];  Service: Gastroenterology;  Laterality: N/A;   ESOPHAGOGASTRODUODENOSCOPY N/A 06/14/2022   Procedure: ESOPHAGOGASTRODUODENOSCOPY (EGD);  Surgeon: [name], MD;  Location: [facility];  Service: Gastroenterology;  Laterality: N/A;   FOREIGN BODY REMOVAL Left 02/10/2015   Procedure: ATTEMPTED EXPLANTATION  OF BIRTH CONTROL DEVICE LEFT ARM;  Surgeon: Johnathan Hausen, MD;  Location: [facility];  Service: General;  Laterality: Left;   FOREIGN BODY REMOVAL Left 05/12/2015   Procedure: REMOVAL OF LEFT ARM BIRTH CONTROL DEVICE;  Surgeon: Johnathan Hausen, MD;  Location: [facility];  Service: General;  Laterality: Left;   SUBMANDIBULAR GLAND EXCISION Right 05/01/2009    Family History  Problem Relation Age of Onset   Breast cancer Mother 39   Heart disease Mother    Hypertension Mother    Colon cancer Neg Hx    Esophageal cancer Neg Hx    Inflammatory bowel disease Neg Hx    Liver disease Neg Hx    Pancreatic cancer Neg Hx    Rectal cancer Neg Hx    Stomach cancer Neg Hx     Social History:  reports that she has been smoking cigarettes. She has a 1.25 pack-year smoking history. She has never used smokeless tobacco. She reports that she does not currently use alcohol. She reports that she does not use drugs.  Allergies:  Allergies  Allergen Reactions   Ativan [Lorazepam] Other (See Comments)    Disorientated, combative    Medications: I have reviewed the patient's current medications.  Results for orders placed or performed during the hospital encounter of 07/30/22 (from the past 48 hour(s))  Urinalysis, Routine w reflex microscopic Urine, Clean Catch     Status: Abnormal   Collection Time: 07/30/22  5:44 PM  Result Value  Ref Range   Color, Urine AMBER (A) YELLOW    Comment: BIOCHEMICALS MAY BE AFFECTED BY COLOR   APPearance CLEAR CLEAR   Specific Gravity, Urine 1.032 (H) 1.005 - 1.030   pH 5.0 5.0 - 8.0   Glucose, UA NEGATIVE NEGATIVE mg/dL   Hgb urine dipstick LARGE (A) NEGATIVE   Bilirubin Urine NEGATIVE NEGATIVE   Ketones, ur 5 (A) NEGATIVE mg/dL   Protein, ur 30 (A) NEGATIVE mg/dL   Nitrite NEGATIVE NEGATIVE   Leukocytes,Ua NEGATIVE NEGATIVE   RBC / HPF 0-5 0 - 5 RBC/hpf   WBC, UA 0-5 0 - 5 WBC/hpf   Bacteria, UA NONE SEEN NONE SEEN   Squamous  Epithelial / LPF 0-5 0 - 5   Mucus PRESENT    Hyaline Casts, UA PRESENT     Comment: Performed at [facility], [place] [address], [place], [place] [address]  Lipase, blood     Status: Abnormal   Collection Time: 07/30/22  5:57 PM  Result Value Ref Range   Lipase 139 (H) 11 - 51 U/L    Comment: Performed at [facility], [place] [address], [place], [place] [address]  Comprehensive metabolic panel     Status: Abnormal   Collection Time: 07/30/22  5:57 PM  Result Value Ref Range   Sodium 141 135 - 145 mmol/L   Potassium 3.4 (L) 3.5 - 5.1 mmol/L   Chloride 116 (H) 98 - 111 mmol/L   CO2 18 (L) 22 - 32 mmol/L   Glucose, Bld 108 (H) 70 - 99 mg/dL    Comment: Glucose reference range applies only to samples taken after fasting for at least 8 hours.   BUN 15 6 - 20 mg/dL   Creatinine, Ser 0.82 0.44 - 1.00 mg/dL   Calcium 8.9 8.9 - 10.3 mg/dL   Total Protein 7.1 6.5 - 8.1 g/dL   Albumin 3.5 3.5 - 5.0 g/dL   AST 13 (L) 15 - 41 U/L   ALT 13 0 - 44 U/L   Alkaline Phosphatase 56 38 - 126 U/L   Total Bilirubin 1.1 0.3 - 1.2 mg/dL   GFR, Estimated >60 >60 mL/min    Comment: (NOTE) Calculated using the CKD-EPI Creatinine Equation (2021)    Anion gap 7 5 - 15    Comment: Performed at [facility], [place] [address] [place], [place] [address]  CBC     Status: Abnormal   Collection Time: 07/30/22  5:57 PM  Result Value Ref Range   WBC 10.8 (H) 4.0 - 10.5 K/uL   RBC 3.57 (L) 3.87 - 5.11 MIL/uL   Hemoglobin 10.1 (L) 12.0 - 15.0 g/dL   HCT 31.6 (L) 36.0 - 46.0 %   MCV 88.5 80.0 - 100.0 fL   MCH 28.3 26.0 - 34.0 pg   MCHC 32.0 30.0 - 36.0 g/dL   RDW 22.5 (H) 11.5 - 15.5 %   Platelets 244 150 - 400 K/uL   nRBC 0.0 0.0 - 0.2 %    Comment: Performed at [facility], [place] [address] [place], [place] [address]  Type and screen [place]     Status: None   Collection Time: 07/30/22  5:57 PM  Result Value Ref  Range   ABO/RH(D) A POS    Antibody Screen NEG    Sample Expiration      08/02/2022,2359 Performed at [facility], [place] [address], [place], [place] [address]  Magnesium     Status: None   Collection Time: 07/30/22  5:57 PM  Result Value Ref Range   Magnesium 1.7 1.7 - 2.4 mg/dL    Comment: Performed at [facility], [place] [address], [place], [place] [address]  I-Stat beta hCG blood, ED     Status: None   Collection Time: 07/30/22  6:10 PM  Result Value Ref Range   I-stat hCG, quantitative <5.0 <5 mIU/mL   Comment 3            Comment:   GEST. AGE      CONC.  (mIU/mL)   <=1 WEEK        5 - 50     2 WEEKS       50 - 500     3 WEEKS       100 - 10,000     4 WEEKS     1,000 - 30,000        FEMALE AND NON-PREGNANT FEMALE:     LESS THAN 5 mIU/mL   CBC     Status: Abnormal   Collection Time: 07/31/22  6:38 AM  Result Value Ref Range   WBC 6.2 4.0 - 10.5 K/uL   RBC 3.05 (L) 3.87 - 5.11 MIL/uL   Hemoglobin 8.6 (L) 12.0 - 15.0 g/dL   HCT 27.1 (L) 36.0 - 46.0 %   MCV 88.9 80.0 - 100.0 fL   MCH 28.2 26.0 - 34.0 pg   MCHC 31.7 30.0 - 36.0 g/dL   RDW 22.5 (H) 11.5 - 15.5 %   Platelets 192 150 - 400 K/uL   nRBC 0.0 0.0 - 0.2 %    Comment: Performed at [facility], [place] [address], [place], [place] [address]  Lipase, blood     Status: Abnormal   Collection Time: 07/31/22  6:38 AM  Result Value Ref Range   Lipase 193 (H) 11 - 51 U/L    Comment: Performed at [facility], [place] [address] [place], [place] [address]  Comprehensive metabolic panel     Status: Abnormal   Collection Time: 07/31/22  6:38 AM  Result Value Ref Range   Sodium 142 135 - 145 mmol/L   Potassium 3.4 (L) 3.5 - 5.1 mmol/L   Chloride 115 (H) 98 - 111 mmol/L   CO2 21 (L) 22 - 32 mmol/L   Glucose, Bld 82 70 - 99 mg/dL    Comment: Glucose reference range applies only to samples taken after fasting for at least 8 hours.   BUN 13 6 - 20 mg/dL    Creatinine, Ser 0.61 0.44 - 1.00 mg/dL   Calcium 8.7 (L) 8.9 - 10.3 mg/dL   Total Protein 6.8 6.5 - 8.1 g/dL   Albumin 3.2 (L) 3.5 - 5.0 g/dL   AST 12 (L) 15 - 41 U/L   ALT 13 0 - 44 U/L   Alkaline Phosphatase 52 38 - 126 U/L   Total Bilirubin 1.0 0.3 - 1.2 mg/dL   GFR, Estimated >60 >60 mL/min    Comment: (NOTE) Calculated using the CKD-EPI Creatinine Equation (2021)    Anion gap 6 5 - 15    Comment: Performed at [facility], [place] [address], [place],  [address]  Magnesium     Status: None   Collection Time: 07/31/22  6:38 AM  Result Value Ref Range   Magnesium 1.7 1.7 - 2.4 mg/dL    Comment: Performed at [facility]  [place] [address], [place], [place] [address]  APTT     Status: None   Collection Time: 07/31/22  6:38 AM  Result Value Ref Range   aPTT 30 24 - 36 seconds    Comment: Performed at [facility], [place] [address] [place], [place] [address]  Protime-INR     Status: None   Collection Time: 07/31/22  6:38 AM  Result Value Ref Range   Prothrombin Time 15.0 11.4 - 15.2 seconds   INR 1.2 0.8 - 1.2    Comment: (NOTE) INR goal varies based on device and disease states. Performed at [facility], [place] [address] [place], [place] [address]   Iron and TIBC     Status: None   Collection Time: 07/31/22  6:38 AM  Result Value Ref Range   Iron 63 28 - 170 ug/dL   TIBC 286 250 - 450 ug/dL   Saturation Ratios 22 10.4 - 31.8 %   UIBC 223 ug/dL    Comment: Performed at [facility], [place] [address] [place], [place] [address]  Lactic acid, plasma     Status: None   Collection Time: 07/31/22  7:38 AM  Result Value Ref Range   Lactic Acid, Venous 0.9 0.5 - 1.9 mmol/L    Comment: Performed at [facility], [place] [address], [place], [place] [address]    CT ABDOMEN PELVIS W CONTRAST  Result Date: 07/30/2022 CLINICAL DATA:  Nausea and vomiting with abdominal  pain, mild hematemesis EXAM: CT ABDOMEN AND PELVIS WITH CONTRAST TECHNIQUE: Multidetector CT imaging of the abdomen and pelvis was performed using the standard protocol following bolus administration of intravenous contrast. RADIATION DOSE REDUCTION: This exam was performed according to the departmental dose-optimization program which includes automated exposure control, adjustment of the mA and/or kV according to patient size and/or use of iterative reconstruction technique. CONTRAST:  186m OMNIPAQUE IOHEXOL 300 MG/ML  SOLN COMPARISON:  07/04/2022 FINDINGS: Lower chest: No acute abnormality. Hepatobiliary: No focal liver abnormality is seen. No gallstones, gallbladder wall thickening, or biliary dilatation. Pancreas: There are peripancreatic inflammatory changes identified in the region of the head of the pancreas consistent with mild acute pancreatitis. Somewhat mottled appearance of the pancreas is noted stable in appearance from prior exams. Multiple small pseudo cysts are again identified and stable. Spleen: Normal in size without focal abnormality. Adrenals/Urinary Tract: Adrenal glands are within normal limits. Kidneys demonstrate a normal enhancement pattern bilaterally. No renal calculi or obstructive changes are seen. The bladder is well distended. Stomach/Bowel: The appendix is within normal limits. No obstructive or inflammatory changes of the colon are seen. Stomach is within normal limits. No small bowel abnormality is noted. Vascular/Lymphatic: Changes consistent with chronic type B dissection of the abdominal aorta are seen. This is stable in appearance from the prior exam and extends to the level of the iliac bifurcation and into the right common iliac artery. No adenopathy is noted. Some venous collaterals from the splenic vein are noted related to long-term splenic vein narrowing. Reproductive: Uterus again demonstrates fibroid change. No adnexal mass is noted. Other: No abdominal wall hernia or  abnormality. No abdominopelvic ascites. Musculoskeletal: No acute or significant osseous findings. IMPRESSION: Changes of mild acute pancreatitis involving the pancreatic head. Changes of previous pancreatitis with multiple small peripancreatic pseudocysts are again identified. 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 Fibroid uterus. Electronically Signed   By: MInez CatalinaM.D.   On: 07/30/2022  21:35    ROS negative except above Blood pressure (!) 135/96, pulse (!) 103, temperature 98.4 F (36.9 C), temperature source Oral, resp. rate 16, height '5\' 8"'$  (1.727 m), weight 82.1 kg, SpO2 100 %. Physical Exam vital signs stable afebrile no acute distress her exam is pertinent for being soft very minimal midepigastric discomfort no guarding or rebound labs and CT reviewed her pseudocysts seem to be shrinking and the one larger one is definitely smaller MRI was reviewed  Assessment/Plan: Chronic pancreatitis recurrent upper GI bleeding questionable etiology Plan: We discussed endoscopy and will proceed as needed or at 9 AM tomorrow and will allow clear liquids today and consider conventional radiology consult as an inpatient if we truly think her gastric varix is playing a role with her recurrent bleeding and we discussed her September EUS as well please call me sooner if any other GI question or problem or signs of active bleeding occur Robin Guerrero E 07/31/2022, 12:55 PM

## 2022-07-31 NOTE — Progress Notes (Signed)
Patient seen and examined personally, I reviewed the chart, history and physical and admission note, done by admitting physician this morning and agree with the same with following addendum.  Please refer to the morning admission note for more detailed plan of care.  Briefly,  36-yof w/ PMH of chronic pancreatitis, GERD,hypertension, type B aortic dissection presented with complaints of abdominal pain and hematemesis- 6-7 episodes of dark red hematemesis 8/15 afternoon.  Prior Hx EtOH abuse, sober x 1 year.  Had EGD in June with varices done by Dr. Rosalie Gums with varix noted.  Patient actually follows with [facility]. 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 EDP D/W  Dr. Henrene Pastor - who will see in AM for possible scope and patient was admitted.     Seen and examined complains of diffuse abdominal pain Passing gas no nausea or vomiting  issues Acute upper GI bleed with hematemesis Abdominal pain GERD without esophagitis: Continue PPI twice daily, IV fluids, antiemetics, await GI evaluation-Dr. Watt Climes has been informed as she followed by [facility].  Acute pancreatitis-CT scan with mild acute pancreatitis.  Lipase mildly elevated, continue symptomatic management Essential hypertension: BP controlled Descending thoracic aortic dissection: stable Hypokalemia: Repleted

## 2022-07-31 NOTE — Progress Notes (Signed)
Transition of Care [facility]) Screening Note  Patient Details  Name: Trishelle Devora Date of Birth: 1986-06-06  Transition of [facility]) CM/SW Contact:    Sherie Don, LCSW Phone Number: 07/31/2022, 10:43 AM  Transition of Care Department [facility]) has reviewed patient and no TOC needs have been identified at this time. We will continue to monitor patient advancement through interdisciplinary progression rounds. If new patient transition needs arise, please place a TOC consult.

## 2022-07-31 NOTE — Progress Notes (Signed)
Patient called nurse desk c/o moderate headache across forehead, Norco/vicodin not due until 2230.   Chriss Driver NP for new orders or recommendations. Gave care order to give 1 hour earlier at 2130.

## 2022-07-31 NOTE — H&P (Signed)
History and Physical    Patient: Robin Guerrero MRN: [id number] DOA: 07/30/2022  Date of Service: the patient was seen and examined on 07/31/2022  Patient coming from: Home  Chief Complaint:  Chief Complaint  Patient presents with   Abdominal Pain   Hematemesis    HPI:   36 year old female with past medical history of chronic pancreatitis, gastroesophageal reflux disease alcohol abuse, hypertension, type B aortic dissection presenting to [facility] emergency department with complaints of abdominal pain and hematemesis.  Last upper endscopy 06/14/2022 with Dr. Alessandra Bevels (however patient follows with [facility] as an outpatient) revealed type I isolated gastric varices as well as congestion and erythema found in the gastric body.  Patient states that she suddenly began to develop epigastric pain earlier in the day on 8/15.  This pain is sharp in quality, epigastric in location, radiating to the back and severe in intensity.  Pain is worse with movement and episodes of vomiting.  Patient has been experiencing intense nausea throughout the afternoon.  Patient reports that she has been vomiting dark blood on each occasion.  Patient denies any associated melena, fever chest pain or shortness of breath.  Symptoms persisted throughout the afternoon and into the evening into the patient eventually presented to [facility] emergency department for evaluation.  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  The chronic type B aortic dissection the patient is noted to have appeared stable. Patient was placed on intravenous Protonix.  Intravenous Dilaudid was administered for pain.  EDP discussed case with Dr. Henrene Pastor with [facility] gastroenterology who stated that gastroenterology would  consult on patient in the morning.  The hospitalist group was then called to assess the patient for admission to the hospital.  Review of Systems: Review of Systems  Gastrointestinal:  Positive for abdominal pain, nausea and vomiting.  All other systems reviewed and are negative.    Past Medical History:  Diagnosis Date   Descending thoracic dissection ([place])    History of anemia    no current med.   Hypertension    states under control with med., has been on med. x 3 yr.   Pancreatitis     Past Surgical History:  Procedure Laterality Date   ESOPHAGOGASTRODUODENOSCOPY N/A 06/06/2022   Procedure: ESOPHAGOGASTRODUODENOSCOPY (EGD);  Surgeon: Arta Silence, MD;  Location: [facility];  Service: Gastroenterology;  Laterality: N/A;   ESOPHAGOGASTRODUODENOSCOPY N/A 06/14/2022   Procedure: ESOPHAGOGASTRODUODENOSCOPY (EGD);  Surgeon: [name], MD;  Location: [facility];  Service: Gastroenterology;  Laterality: N/A;   FOREIGN BODY REMOVAL Left 02/10/2015   Procedure: ATTEMPTED EXPLANTATION OF BIRTH CONTROL DEVICE LEFT ARM;  Surgeon: Johnathan Hausen, MD;  Location: [facility];  Service: General;  Laterality: Left;   FOREIGN BODY REMOVAL Left 05/12/2015   Procedure: REMOVAL OF LEFT ARM BIRTH CONTROL DEVICE;  Surgeon: Johnathan Hausen, MD;  Location: [facility];  Service: General;  Laterality: Left;   SUBMANDIBULAR GLAND EXCISION Right 05/01/2009    Social History:  reports that she has been smoking cigarettes. She has a 1.25 pack-year smoking history. She has never used smokeless tobacco. She reports that she does not currently use alcohol. She reports that she does not use drugs.  Allergies  Allergen Reactions   Ativan [Lorazepam] Other (See Comments)    Disorientated, combative    Family History  Problem Relation  Age of Onset   Breast cancer Mother 5   Heart disease Mother    Hypertension Mother    Colon cancer Neg Hx    Esophageal cancer Neg Hx     Inflammatory bowel disease Neg Hx    Liver disease Neg Hx    Pancreatic cancer Neg Hx    Rectal cancer Neg Hx    Stomach cancer Neg Hx     Prior to Admission medications   Medication Sig Start Date End Date Taking? Authorizing Provider  amLODipine (NORVASC) 10 MG tablet Take 1 tablet (10 mg total) by mouth daily. 03/08/22  Yes Patwardhan, Manish J, MD  cloNIDine (CATAPRES) 0.2 MG tablet Take 1 tablet (0.2 mg total) by mouth every 8 (eight) hours. 03/08/22 06/12/23 Yes Patwardhan, Manish J, MD  hydrALAZINE (APRESOLINE) 100 MG tablet Take 1 tablet (100 mg total) by mouth 3 (three) times daily. 03/08/22 03/08/23 Yes Patwardhan, Manish J, MD  losartan (COZAAR) 100 MG tablet Take 1 tablet (100 mg total) by mouth daily. 06/08/22 09/06/22 Yes Ghimire, Dante Gang, MD  ondansetron (ZOFRAN) 4 MG tablet Take 1 tablet (4 mg total) by mouth every 8 (eight) hours as needed for nausea or vomiting. 06/08/22  Yes Ghimire, Dante Gang, MD  Pancrelipase, Lip-Prot-Amyl, (ZENPEP) 40000-126000 units CPEP Take by mouth. 2 tablets with each  meal and 1 tablet with snacks   Yes [provider]  polyethylene glycol (MIRALAX / GLYCOLAX) 17 g packet Take 17 g by mouth daily. 06/15/22  Yes Regalado, Belkys A, MD  promethazine (PHENERGAN) 12.5 MG tablet Take 1 tablet (12.5 mg total) by mouth every 8 (eight) hours as needed for nausea or vomiting. 07/05/22  Yes Mansouraty, Telford Nab., MD  propranolol (INDERAL) 10 MG tablet Take 1 tablet (10 mg total) by mouth 2 (two) times daily. 06/08/22 09/06/22 Yes Ghimire, Dante Gang, MD  ferrous sulfate 325 (65 FE) MG tablet TAKE 1 TABLET BY MOUTH EVERY DAY WITH BREAKFAST Patient not taking: Reported on 07/30/2022 03/25/22   Patwardhan, Reynold Bowen, MD  lipase/protease/amylase (CREON) 36000 UNITS CPEP capsule Take 1 capsule (36,000 Units total) by mouth 3 (three) times daily before meals. Patient not taking: Reported on 07/30/2022 06/15/22   Regalado, Jerald Kief A, MD  pantoprazole (PROTONIX) 40 MG tablet Take 1  tablet (40 mg total) by mouth 2 (two) times daily. Patient not taking: Reported on 07/05/2022 06/14/22 06/14/23  Niel Hummer A, MD  vitamin B-12 1000 MCG tablet Take 1 tablet (1,000 mcg total) by mouth daily. Patient not taking: Reported on 07/30/2022 06/15/22   Elmarie Shiley, MD    Physical Exam:  Vitals:   07/31/22 0545 07/31/22 0830 07/31/22 0855 07/31/22 0919  BP:  (!) 139/104  (!) 135/96  Pulse: 85 (!) 106  (!) 103  Resp: '13 16  16  '$ Temp:   99.1 F (37.3 C) 98.4 F (36.9 C)  TempSrc:   Oral Oral  SpO2: 98% 100%  100%  Weight:      Height:        Constitutional: Awake alert and oriented x3, no associated distress.   Skin: no rashes, no lesions, good skin turgor noted. Eyes: Pupils are equally reactive to light.  No evidence of scleral icterus or conjunctival pallor.  ENMT: Moist mucous membranes noted.  Posterior pharynx clear of any exudate or lesions.   Neck: normal, supple, no masses, no thyromegaly.  No evidence of jugular venous distension.   Respiratory: clear to auscultation bilaterally, no wheezing, no crackles. Normal respiratory effort. No  accessory muscle use.  Cardiovascular: Regular rate and rhythm, no murmurs / rubs / gallops. No extremity edema. 2+ pedal pulses. No carotid bruits.  Chest:   Nontender without crepitus or deformity.   Back:   Nontender without crepitus or deformity. Abdomen: Diffuse tenderness, abdomen is soft.   No evidence of intra-abdominal masses.  Positive bowel sounds noted in all quadrants.   Musculoskeletal: No joint deformity upper and lower extremities. Good ROM, no contractures. Normal muscle tone.  Neurologic: CN 2-12 grossly intact. Sensation intact.  Patient moving all 4 extremities spontaneously.  Patient is following all commands.  Patient is responsive to verbal stimuli.   Psychiatric: Patient exhibits normal mood with appropriate affect.  Patient seems to possess insight as to their current situation.    Data Reviewed:  I  have personally reviewed and interpreted labs, imaging.  Significant findings are:  Urinalysis revealing specific gravity of 1.032 Lipase 139 Chemistry revealing sodium 141, potassium 3.4, chloride 116, bicarbonate 18, BUN 15, creatinine 0.82 CBC revealing white blood cell count of 10.8, hemoglobin 10.1, hematocrit 31.6, platelet count 244 Magnesium 1.7 Mild acute pancreatitis involving the pancreatic head.  Changes of previous pancreatitis with multiple small peripancreatic pseudocysts are again identified.  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  EKG: Personally reviewed.  Rhythm is sinus tachycardia with heart rate of 114 bpm.  No dynamic ST segment changes appreciated.   Assessment and Plan: * Acute upper GI bleed Patient reports experiencing at least 5 episodes of dark red vomitus in the past 24 hours This seems to have coincided with a new episode of acute on chronic pancreatitis  Currently hemodynamically stable, Hgb is much higher than prior however CBC appears hemoconcentrated.   EDP discussed case with Dr. Henrene Pastor who stated GI will see patient in consultation for possible endoscopic evaluation Protonix IV Q12hrs initiated NPO for now IVF initiated CBC 6hrs  Acute pancreatitis Evidence of acute on chronic pancreatitis on CT imaging NPO for now as above Hydrating with IVF PRN opiate based analgesics for associated pain Advance diet as tolerated once GI evaluation / possible endoscopy are complete.   Essential hypertension Resume patients home regimen of oral antihypertensives Titrate antihypertensive regimen as necessary to achieve adequate BP control PRN intravenous antihypertensives for excessively elevated blood pressure    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[facility]) Stable in appearance on Ct when compared to prior imaging Will ensure tight BP control Outpatient F/U  GERD without esophagitis IV PPI as noted above.   Hypokalemia Replacing  with potassium chloride Evaluating for concurrent hypomagnesemia  Monitoring potassium levels with serial chemistries.        Code Status:  Full code  code status decision has been confirmed with: patient Family Communication: Partner at bedside who has been updated on plan of care.   Consults: Dr. Henrene Pastor with Gastroenterology  Severity of Illness:  The appropriate patient status for this patient is OBSERVATION. Observation status is judged to be reasonable and necessary in order to provide the required intensity of service to ensure the patient's safety. The patient's presenting symptoms, physical exam findings, and initial radiographic and laboratory data in the context of their medical condition is felt to place them at decreased risk for further clinical deterioration. Furthermore, it is anticipated that the patient will be medically stable for discharge from the hospital within 2 midnights of admission.   Author:  Vernelle Emerald MD  07/31/2022 9:26 AM

## 2022-07-31 NOTE — Hospital Course (Addendum)
36-yof w/ PMH of chronic pancreatitis, GERD,hypertension, type B aortic dissection presented with complaints of abdominal pain and hematemesis- 6-7 episodes of dark red hematemesis 8/15 afternoon.  Prior Hx EtOH abuse, sober x 1 year.  Had EGD in June with varices done by Dr. Rosalie Gums with varix noted.  Patient actually follows with [facility]. 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 EDP D/W  Dr. Henrene Pastor - but pt of [facility] so Dr Watt Climes was consulted.  Underwent EGD no significant findings in the report.  Post EGD had issue with tachycardia hypertension pain that is stabilized with supportive care.  Diet advanced at this time tolerating soft diet, discussed with GI and okay for discharge-we will need outpatient pain clinic follow-up as well as referral to tertiary care which GI is working on.

## 2022-07-31 NOTE — Assessment & Plan Note (Addendum)
   Evidence of acute on chronic pancreatitis on CT imaging  NPO for now as above  Hydrating with IVF  PRN opiate based analgesics for associated pain  Advance diet as tolerated once GI evaluation / possible endoscopy are complete.

## 2022-07-31 NOTE — Assessment & Plan Note (Signed)
   Patient reports experiencing at least 5 episodes of dark red vomitus in the past 24 hours  This seems to have coincided with a new episode of acute on chronic pancreatitis   Currently hemodynamically stable, Hgb is much higher than prior however CBC appears hemoconcentrated.    EDP discussed case with Dr. Henrene Pastor who stated GI will see patient in consultation for possible endoscopic evaluation  Protonix IV Q12hrs initiated  NPO for now  IVF initiated  CBC 6hrs

## 2022-07-31 NOTE — Assessment & Plan Note (Signed)
·   Replacing with potassium chloride °· Evaluating for concurrent hypomagnesemia  °· Monitoring potassium levels with serial chemistries. ° °

## 2022-07-31 NOTE — Assessment & Plan Note (Signed)
IV PPI as noted above.

## 2022-07-31 NOTE — Anesthesia Preprocedure Evaluation (Signed)
Anesthesia Evaluation  Patient identified by MRN, date of birth, ID band Patient awake    Reviewed: Allergy & Precautions, NPO status , Patient's Chart, lab work & pertinent test results  Airway Mallampati: II  TM Distance: >3 FB Neck ROM: Full    Dental no notable dental hx. (+) Teeth Intact, Dental Advisory Given   Pulmonary Current Smoker and Patient abstained from smoking.,    Pulmonary exam normal breath sounds clear to auscultation       Cardiovascular hypertension, Pt. on medications + Peripheral Vascular Disease  Normal cardiovascular exam Rhythm:Regular Rate:Normal     Neuro/Psych PSYCHIATRIC DISORDERS Depression    GI/Hepatic GERD  ,  Endo/Other    Renal/GU      Musculoskeletal   Abdominal   Peds  Hematology  (+) Blood dyscrasia, anemia , Lab Results      Component                Value               Date                      WBC                      6.2                 07/31/2022                HGB                      8.6 (L)             07/31/2022                HCT                      27.1 (L)            07/31/2022                MCV                      88.9                07/31/2022                PLT                      192                 07/31/2022              Anesthesia Other Findings ALL: ativan  Reproductive/Obstetrics                            Anesthesia Physical Anesthesia Plan  ASA: 3  Anesthesia Plan: MAC   Post-op Pain Management:    Induction: Intravenous  PONV Risk Score and Plan: Treatment may vary due to age or medical condition, Propofol infusion and TIVA  Airway Management Planned: Natural Airway and Nasal Cannula  Additional Equipment: None  Intra-op Plan:   Post-operative Plan:   Informed Consent: I have reviewed the patients History and Physical, chart, labs and discussed the procedure including the risks, benefits and alternatives for  the proposed anesthesia with the patient or authorized representative who has indicated his/her understanding  and acceptance.     Dental advisory given  Plan Discussed with:   Anesthesia Plan Comments: (EGD for hematemesis & Anemia)       Anesthesia Quick Evaluation

## 2022-07-31 NOTE — Assessment & Plan Note (Signed)
   Stable in appearance on Ct when compared to prior imaging  Will ensure tight BP control  Outpatient F/U

## 2022-07-31 NOTE — Progress Notes (Signed)
Patient had 3 bites of sandwich this morning at 6am-7am in the ED this morning. EGD will be moved tomorrow at [facility] on 08/01/22.

## 2022-07-31 NOTE — Assessment & Plan Note (Signed)
.   Resume patients home regimen of oral antihypertensives . Titrate antihypertensive regimen as necessary to achieve adequate BP control . PRN intravenous antihypertensives for excessively elevated blood pressure   

## 2022-07-31 NOTE — Anesthesia Preprocedure Evaluation (Signed)
Anesthesia Evaluation  Patient identified by MRN, date of birth, ID band Patient awake    Reviewed: Allergy & Precautions, NPO status , Patient's Chart, lab work & pertinent test results, reviewed documented beta blocker date and time   Airway        Dental   Pulmonary Current Smoker and Patient abstained from smoking.,           Cardiovascular hypertension, Pt. on medications and Pt. on home beta blockers   Chronic type B dissection descending aorta  CT a/p this admission: Changes of mild acute pancreatitis involving the pancreatic head. Changes of previous pancreatitis with multiple small peripancreatic pseudocysts are again identified.  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    Neuro/Psych PSYCHIATRIC DISORDERS Depression negative neurological ROS     GI/Hepatic GERD  Controlled and Medicated,(+)     substance abuse (sober x 1 year)  alcohol use, Acute on chronic pancreatitis  complaints of abdominal pain and hematemesis- 6-7 episodes of dark red hematemesis 8/15 afternoon.  Prior Hx EtOH abuse, sober x 1 year.   Endo/Other  negative endocrine ROS  Renal/GU negative Renal ROS  negative genitourinary   Musculoskeletal negative musculoskeletal ROS (+)   Abdominal   Peds  Hematology  (+) Blood dyscrasia, anemia , Hb 8.6   Anesthesia Other Findings   Reproductive/Obstetrics negative OB ROS                             Anesthesia Physical Anesthesia Plan  ASA: 3  Anesthesia Plan: General   Post-op Pain Management:    Induction: Rapid sequence and Cricoid pressure planned  PONV Risk Score and Plan: 3 and Ondansetron, Dexamethasone and Treatment may vary due to age or medical condition  Airway Management Planned: Oral ETT  Additional Equipment: None  Intra-op Plan:   Post-operative Plan: Extubation in OR  Informed Consent: I have reviewed the patients  History and Physical, chart, labs and discussed the procedure including the risks, benefits and alternatives for the proposed anesthesia with the patient or authorized representative who has indicated his/her understanding and acceptance.     Dental advisory given  Plan Discussed with: CRNA  Anesthesia Plan Comments:         Anesthesia Quick Evaluation

## 2022-08-01 ENCOUNTER — Encounter (HOSPITAL_COMMUNITY)
Admission: EM | Disposition: A | Discharge status: Home / Self Care | Payer: Self-pay | Source: Home / Self Care | Attending: Internal Medicine

## 2022-08-01 ENCOUNTER — Observation Stay (HOSPITAL_COMMUNITY): Payer: 59 | Admitting: Anesthesiology

## 2022-08-01 ENCOUNTER — Encounter (HOSPITAL_COMMUNITY): Payer: Self-pay | Admitting: Internal Medicine

## 2022-08-01 ENCOUNTER — Observation Stay (HOSPITAL_COMMUNITY): Payer: 59

## 2022-08-01 ENCOUNTER — Observation Stay (HOSPITAL_BASED_OUTPATIENT_CLINIC_OR_DEPARTMENT_OTHER): Payer: 59 | Admitting: Anesthesiology

## 2022-08-01 DIAGNOSIS — K859 Acute pancreatitis without necrosis or infection, unspecified: Secondary | ICD-10-CM | POA: Diagnosis present

## 2022-08-01 DIAGNOSIS — F17210 Nicotine dependence, cigarettes, uncomplicated: Secondary | ICD-10-CM | POA: Diagnosis present

## 2022-08-01 DIAGNOSIS — D649 Anemia, unspecified: Secondary | ICD-10-CM

## 2022-08-01 DIAGNOSIS — Z8249 Family history of ischemic heart disease and other diseases of the circulatory system: Secondary | ICD-10-CM | POA: Diagnosis not present

## 2022-08-01 DIAGNOSIS — I864 Gastric varices: Secondary | ICD-10-CM

## 2022-08-01 DIAGNOSIS — K861 Other chronic pancreatitis: Secondary | ICD-10-CM | POA: Diagnosis present

## 2022-08-01 DIAGNOSIS — R000 Tachycardia, unspecified: Secondary | ICD-10-CM | POA: Diagnosis not present

## 2022-08-01 DIAGNOSIS — E876 Hypokalemia: Secondary | ICD-10-CM | POA: Diagnosis present

## 2022-08-01 DIAGNOSIS — R109 Unspecified abdominal pain: Secondary | ICD-10-CM | POA: Diagnosis present

## 2022-08-01 DIAGNOSIS — K449 Diaphragmatic hernia without obstruction or gangrene: Secondary | ICD-10-CM

## 2022-08-01 DIAGNOSIS — K863 Pseudocyst of pancreas: Secondary | ICD-10-CM | POA: Diagnosis present

## 2022-08-01 DIAGNOSIS — K922 Gastrointestinal hemorrhage, unspecified: Secondary | ICD-10-CM | POA: Diagnosis not present

## 2022-08-01 DIAGNOSIS — Z79899 Other long term (current) drug therapy: Secondary | ICD-10-CM | POA: Diagnosis not present

## 2022-08-01 DIAGNOSIS — Z888 Allergy status to other drugs, medicaments and biological substances status: Secondary | ICD-10-CM | POA: Diagnosis not present

## 2022-08-01 DIAGNOSIS — K219 Gastro-esophageal reflux disease without esophagitis: Secondary | ICD-10-CM | POA: Diagnosis present

## 2022-08-01 DIAGNOSIS — K920 Hematemesis: Secondary | ICD-10-CM | POA: Diagnosis present

## 2022-08-01 DIAGNOSIS — I10 Essential (primary) hypertension: Secondary | ICD-10-CM | POA: Diagnosis not present

## 2022-08-01 DIAGNOSIS — E878 Other disorders of electrolyte and fluid balance, not elsewhere classified: Secondary | ICD-10-CM | POA: Diagnosis present

## 2022-08-01 DIAGNOSIS — I71012 Dissection of descending thoracic aorta: Secondary | ICD-10-CM | POA: Diagnosis present

## 2022-08-01 DIAGNOSIS — I160 Hypertensive urgency: Secondary | ICD-10-CM | POA: Diagnosis not present

## 2022-08-01 HISTORY — PX: ESOPHAGOGASTRODUODENOSCOPY (EGD) WITH PROPOFOL: SHX5813

## 2022-08-01 LAB — COMPREHENSIVE METABOLIC PANEL
ALT: 11 U/L (ref 0–44)
ALT: 13 U/L (ref 0–44)
AST: 11 U/L — ABNORMAL LOW (ref 15–41)
AST: 14 U/L — ABNORMAL LOW (ref 15–41)
Albumin: 3.2 g/dL — ABNORMAL LOW (ref 3.5–5.0)
Albumin: 3.5 g/dL (ref 3.5–5.0)
Alkaline Phosphatase: 53 U/L (ref 38–126)
Alkaline Phosphatase: 56 U/L (ref 38–126)
Anion gap: 9 (ref 5–15)
Anion gap: 12 (ref 5–15)
BUN: 9 mg/dL (ref 6–20)
BUN: 8 mg/dL (ref 6–20)
CO2: 19 mmol/L — ABNORMAL LOW (ref 22–32)
CO2: 18 mmol/L — ABNORMAL LOW (ref 22–32)
Calcium: 8.7 mg/dL — ABNORMAL LOW (ref 8.9–10.3)
Calcium: 8.9 mg/dL (ref 8.9–10.3)
Chloride: 111 mmol/L (ref 98–111)
Chloride: 112 mmol/L — ABNORMAL HIGH (ref 98–111)
Creatinine, Ser: 0.73 mg/dL (ref 0.44–1.00)
Creatinine, Ser: 0.77 mg/dL (ref 0.44–1.00)
GFR, Estimated: >60 mL/min (ref >60)
GFR, Estimated: >60 mL/min (ref >60)
Glucose, Bld: 77 mg/dL (ref 70–99)
Glucose, Bld: 103 mg/dL — ABNORMAL HIGH (ref 70–99)
Potassium: 3.1 mmol/L — ABNORMAL LOW (ref 3.5–5.1)
Potassium: 3.2 mmol/L — ABNORMAL LOW (ref 3.5–5.1)
Sodium: 139 mmol/L (ref 135–145)
Sodium: 142 mmol/L (ref 135–145)
Total Bilirubin: 1.1 mg/dL (ref 0.3–1.2)
Total Bilirubin: 1.3 mg/dL — ABNORMAL HIGH (ref 0.3–1.2)
Total Protein: 6.5 g/dL (ref 6.5–8.1)
Total Protein: 7 g/dL (ref 6.5–8.1)

## 2022-08-01 LAB — CBC
HCT: 26.7 % — ABNORMAL LOW (ref 36.0–46.0)
HCT: 28.2 % — ABNORMAL LOW (ref 36.0–46.0)
Hemoglobin: 8.5 g/dL — ABNORMAL LOW (ref 12.0–15.0)
Hemoglobin: 9.1 g/dL — ABNORMAL LOW (ref 12.0–15.0)
MCH: 29 pg (ref 26.0–34.0)
MCH: 28.4 pg (ref 26.0–34.0)
MCHC: 31.8 g/dL (ref 30.0–36.0)
MCHC: 32.3 g/dL (ref 30.0–36.0)
MCV: 91.1 fL (ref 80.0–100.0)
MCV: 88.1 fL (ref 80.0–100.0)
Platelets: 208 10*3/uL (ref 150–400)
Platelets: 228 10*3/uL (ref 150–400)
RBC: 2.93 MIL/uL — ABNORMAL LOW (ref 3.87–5.11)
RBC: 3.2 MIL/uL — ABNORMAL LOW (ref 3.87–5.11)
RDW: 22.8 % — ABNORMAL HIGH (ref 11.5–15.5)
RDW: 22.4 % — ABNORMAL HIGH (ref 11.5–15.5)
WBC: 6.6 10*3/uL (ref 4.0–10.5)
WBC: 10.8 10*3/uL — ABNORMAL HIGH (ref 4.0–10.5)
nRBC: 0 % (ref 0.0–0.2)
nRBC: 0 % (ref 0.0–0.2)

## 2022-08-01 LAB — TROPONIN I (HIGH SENSITIVITY)
Troponin I (High Sensitivity): 6 ng/L (ref <18)
Troponin I (High Sensitivity): 6 ng/L (ref <18)

## 2022-08-01 LAB — PHOSPHORUS: Phosphorus: 3.7 mg/dL (ref 2.5–4.6)

## 2022-08-01 LAB — LACTIC ACID, PLASMA
Lactic Acid, Venous: 0.9 mmol/L (ref 0.5–1.9)
Lactic Acid, Venous: 0.8 mmol/L (ref 0.5–1.9)

## 2022-08-01 LAB — LIPASE, BLOOD: Lipase: 139 U/L — ABNORMAL HIGH (ref 11–51)

## 2022-08-01 LAB — AMYLASE: Amylase: 295 U/L — ABNORMAL HIGH (ref 28–100)

## 2022-08-01 LAB — MAGNESIUM: Magnesium: 1.6 mg/dL — ABNORMAL LOW (ref 1.7–2.4)

## 2022-08-01 SURGERY — ESOPHAGOGASTRODUODENOSCOPY (EGD) WITH PROPOFOL
Anesthesia: Monitor Anesthesia Care

## 2022-08-01 MED ORDER — SODIUM CHLORIDE 0.9 % IV SOLN
12.5000 mg | Freq: Four times a day (QID) | INTRAVENOUS | Status: DC | PRN
  Filled 2022-08-01: qty 0.5

## 2022-08-01 MED ORDER — ESMOLOL HCL-SODIUM CHLORIDE 2000 MG/100ML IV SOLN
INTRAVENOUS | Status: DC | PRN
  Administered 2022-08-01: 20 mg via INTRAVENOUS

## 2022-08-01 MED ORDER — CLONIDINE HCL 0.1 MG PO TABS
0.2000 mg | ORAL_TABLET | Freq: Three times a day (TID) | ORAL | Status: DC
  Administered 2022-08-01 – 2022-08-02 (×3): 0.2 mg via ORAL
  Filled 2022-08-01 (×3): qty 2

## 2022-08-01 MED ORDER — MIDAZOLAM HCL 2 MG/2ML IJ SOLN
INTRAMUSCULAR | Status: DC | PRN
  Administered 2022-08-01: 2 mg via INTRAVENOUS

## 2022-08-01 MED ORDER — ONDANSETRON HCL 4 MG/2ML IJ SOLN
4.0000 mg | Freq: Four times a day (QID) | INTRAMUSCULAR | Status: DC | PRN
Start: 2022-08-01 — End: 2022-08-02
  Administered 2022-08-01 – 2022-08-02 (×4): 4 mg via INTRAVENOUS
  Filled 2022-08-01 (×3): qty 2

## 2022-08-01 MED ORDER — PROPOFOL 10 MG/ML IV BOLUS
INTRAVENOUS | Status: AC
  Filled 2022-08-01: qty 20

## 2022-08-01 MED ORDER — LACTATED RINGERS IV SOLN
INTRAVENOUS | Status: AC | PRN
  Administered 2022-08-01: 20 mL/h via INTRAVENOUS

## 2022-08-01 MED ORDER — SODIUM CHLORIDE 0.9 % IV SOLN: INTRAVENOUS | Status: DC

## 2022-08-01 MED ORDER — POTASSIUM CHLORIDE CRYS ER 20 MEQ PO TBCR
40.0000 meq | EXTENDED_RELEASE_TABLET | Freq: Once | ORAL | Status: AC
Start: 2022-08-01 — End: 2022-08-01
  Administered 2022-08-01: 40 meq via ORAL
  Filled 2022-08-01: qty 2

## 2022-08-01 MED ORDER — PANCRELIPASE (LIP-PROT-AMYL) 12000-38000 UNITS PO CPEP: 36000.0000 [IU] | ORAL_CAPSULE | Freq: Three times a day (TID) | ORAL | Status: DC

## 2022-08-01 MED ORDER — HYDROMORPHONE HCL 1 MG/ML IJ SOLN
1.0000 mg | INTRAMUSCULAR | Status: AC
  Administered 2022-08-01: 1 mg via INTRAVENOUS
  Filled 2022-08-01: qty 1

## 2022-08-01 MED ORDER — PROPOFOL 10 MG/ML IV BOLUS
INTRAVENOUS | Status: DC | PRN
  Administered 2022-08-01 (×3): 50 mg via INTRAVENOUS

## 2022-08-01 MED ORDER — HYDROMORPHONE HCL 1 MG/ML IJ SOLN
0.5000 mg | INTRAMUSCULAR | Status: DC | PRN
  Administered 2022-08-01 – 2022-08-02 (×3): 0.5 mg via INTRAVENOUS
  Filled 2022-08-01 (×3): qty 0.5

## 2022-08-01 MED ORDER — DEXMEDETOMIDINE HCL IN NACL 80 MCG/20ML IV SOLN
INTRAVENOUS | Status: AC
  Filled 2022-08-01: qty 20

## 2022-08-01 MED ORDER — PROCHLORPERAZINE EDISYLATE 10 MG/2ML IJ SOLN
10.0000 mg | INTRAMUSCULAR | Status: AC
  Administered 2022-08-01: 10 mg via INTRAVENOUS

## 2022-08-01 MED ORDER — DEXMEDETOMIDINE (PRECEDEX) IN NS 20 MCG/5ML (4 MCG/ML) IV SYRINGE
PREFILLED_SYRINGE | INTRAVENOUS | Status: DC | PRN
  Administered 2022-08-01: 20 ug via INTRAVENOUS

## 2022-08-01 MED ORDER — LIDOCAINE HCL (CARDIAC) PF 100 MG/5ML IV SOSY
PREFILLED_SYRINGE | INTRAVENOUS | Status: DC | PRN
  Administered 2022-08-01: 100 mg via INTRAVENOUS

## 2022-08-01 MED ORDER — PROPOFOL 500 MG/50ML IV EMUL
INTRAVENOUS | Status: DC | PRN
  Administered 2022-08-01: 150 ug/kg/min via INTRAVENOUS

## 2022-08-01 MED ORDER — MIDAZOLAM HCL 2 MG/2ML IJ SOLN
INTRAMUSCULAR | Status: AC
  Filled 2022-08-01: qty 2

## 2022-08-01 MED ORDER — POTASSIUM CHLORIDE 10 MEQ/100ML IV SOLN
10.0000 meq | INTRAVENOUS | Status: AC
  Administered 2022-08-01 (×2): 10 meq via INTRAVENOUS
  Filled 2022-08-01 (×2): qty 100

## 2022-08-01 MED ORDER — SODIUM CHLORIDE 0.9 % IV SOLN
INTRAVENOUS | Status: DC
Start: 2022-08-01 — End: 2022-08-01

## 2022-08-01 SURGICAL SUPPLY — 15 items

## 2022-08-01 NOTE — Significant Event (Signed)
Rapid Response Event Note   Reason for Call : Tachycardia increased after Endoscopy     Initial Focused Assessment:  Patient waxing and waning due to increased abdomen pain.  Alert and oriented VSS see flowsheet. Dr. Lupita Leash at bedside.    Interventions:  Compazine per order Dilaudid per order EKG Abdnomal Xray per order   Plan of Care:     Event Summary:   MD Notified: Dr. Lupita Leash, Dr. Watt Climes  Call Time: [place] Time: [id number] End Time: [place], RN

## 2022-08-01 NOTE — Anesthesia Postprocedure Evaluation (Signed)
Anesthesia Post Note  Patient: Robin Guerrero  Procedure(s) Performed: ESOPHAGOGASTRODUODENOSCOPY (EGD) WITH PROPOFOL     Patient location during evaluation: Endoscopy Anesthesia Type: MAC Level of consciousness: awake and alert Pain management: pain level controlled Vital Signs Assessment: post-procedure vital signs reviewed and stable Respiratory status: spontaneous breathing, nonlabored ventilation, respiratory function stable and patient connected to nasal cannula oxygen Cardiovascular status: stable and blood pressure returned to baseline Postop Assessment: no apparent nausea or vomiting Anesthetic complications: no   No notable events documented.  Last Vitals:  Vitals:   08/01/22 1144 08/01/22 1243  BP: (!) 147/95 (!) 172/115  Pulse: (!) 114 (!) 125  Resp: 20   Temp: 37.4 C (!) 36.4 C  SpO2: 95% 100%    Last Pain:  Vitals:   08/01/22 1246  TempSrc:   PainSc: 8                  [place]

## 2022-08-01 NOTE — Progress Notes (Signed)
PROGRESS NOTE Robin Guerrero  DZH:[id number] DOB: 1986-10-18 DOA: 07/30/2022 PCP: Trey Sailors, PA   Brief Narrative/Hospital Course: 16-yof w/ PMH of chronic pancreatitis, GERD,hypertension, type B aortic dissection presented with complaints of abdominal pain and hematemesis- 6-7 episodes of dark red hematemesis 8/15 afternoon.  Prior Hx EtOH abuse, sober x 1 year.  Had EGD in June with varices done by Dr. Rosalie Gums with varix noted.  Patient actually follows with [facility]. 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 EDP D/W  Dr. Henrene Pastor - who will see in AM for possible scope and patient was admitted.     Subjective: Seen and examined this morning urgently Seen with rapid response. Post egd having Post egd having significant hypertension, tachycardia  epigastric abdomen pain nausea BP now in 150s after iv labetalol, HR in 120s  Assessment and Plan: Principal Problem:   Acute upper GI bleed Active Problems:   Acute pancreatitis   Essential hypertension   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HCC)   GERD without esophagitis   Hypokalemia   Nausea/Vomiting and Hematemesis Abdominal pain GERD without esophagitis: S/p EGD- see report below.Post egd having significant hypertension, tachycardia  epigastric abdomen pain nausea- rapid response called- iv zofran did not help- ordering iv compazine, dilaudid x1. Dr Watt Climes also paged to re the events- I ordered cxr/ xray abd, and stat labs-including amylase lipase troponin lactic acid.Cont PPI bid ivf, along with antiemetics and pain medication.  RN informed that Dr. Watt Climes came up to see her and diet is being advanced.   Acute pancreatitis-CT scan with mild acute pancreatitis.  Lipase mildly elevated, continue symptomatic management, ivf.  Essential hypertension HTN  Urgency- back on multiple home meds- als oon iv labetatolo psot egd due to hypertension yrgency  Descending thoracic aortic dissection: stable epr CT on admit. Hypokalemia: Repleted  EGD: IMpression -Gastric varices, without bleeding. - Normal duodenal bulb, first portion of the duodenum, second portion of the duodenum and third portion of the duodenum. - The examination was otherwise normal. - No specimens collected. Recs: - Soft diet today. - Continue present medications. - Return to GI clinic as previously scheduled. - Telephone GI clinic if symptomatic PRN. I discussed her case with IR and based on her CT scan they do not have anything to offer her from a TIPS or BRTO standpoint and think if gastric varices are to blame for her bleeding which is hard to definitively say at this point we need a splenectomy and with her aorta problem complicating things more they think she is a candidate for [facility] care which I agree with on top of her chronic pancreatitis issues  DVT prophylaxis: SCDs Start: 07/31/22 4196 Code Status:   Code Status: Full Code Family Communication: plan of care discussed with patient at bedside. Patient status is: Admitted as observation remains hospitalized because of ongoing issue along with post EGD events of hypertension severe pain. Level of care: Telemetry   Dispo: The patient is from: home            Anticipated disposition: home in 24 hrs if Improves Mobility Assessment (last 72 hours)     Mobility Assessment     Row Name 07/31/22 1000           Does patient have an order for bedrest or is patient medically unstable No - Continue assessment  What is the highest level of mobility based on the progressive mobility assessment? Level 6 (Walks independently in room and hall) - Balance while walking in room without assist - Complete                 Objective: Vitals last 24 hrs: Vitals:   08/01/22 1012 08/01/22 1022 08/01/22 1047  08/01/22 1104  BP: (!) 182/115 (!) 199/137 (!) 181/123 (!) 157/113  Pulse: (!) 115 (!) 134 (!) 147 (!) 116  Resp: '19 12 15 20  '$ Temp:      TempSrc:      SpO2: 100% 99% 100% 100%  Weight:      Height:       Weight change:   Physical Examination: General exam: alert awake, pleasant distress tachycardic tachypneic> started to calm down feel better after Dilaudid and Compazine HEENT:Oral mucosa moist, Ear/Nose WNL grossly, dentition normal. Respiratory system: bilaterally clear BS, no use of accessory muscle Cardiovascular system: S1 & S2 +, No JVD. Gastrointestinal system: Abdomen soft,Tender in mid abdomen,ND, BS+ Nervous System:Alert, awake, moving extremities and grossly nonfocal Extremities: LE edema neg,distal peripheral pulses palpable.  Skin: No rashes,no icterus. MSK: Normal muscle bulk,tone, power  Medications reviewed:  Scheduled Meds:  amLODipine  10 mg Oral Daily   cloNIDine  0.2 mg Oral Q8H   cyanocobalamin  1,000 mcg Oral Daily   hydrALAZINE  100 mg Oral TID    HYDROmorphone (DILAUDID) injection  1 mg Intravenous STAT   lipase/protease/amylase  36,000 Units Oral TID AC   losartan  100 mg Oral Daily   pantoprazole (PROTONIX) IV  40 mg Intravenous Q12H   prochlorperazine  10 mg Intravenous STAT   propranolol  10 mg Oral BID   Continuous Infusions:  promethazine (PHENERGAN) injection (IM or IVPB)        Diet Order             Diet NPO time specified Except for: Sips with Meds  Diet effective midnight                            Intake/Output Summary (Last 24 hours) at 08/01/2022 1129 Last data filed at 08/01/2022 1000 Gross per 24 hour  Intake 1504.25 ml  Output 1200 ml  Net 304.25 ml   Net IO Since Admission: 1,404.25 mL [08/01/22 1129]  Wt Readings from Last 3 Encounters:  07/30/22 82.1 kg  07/05/22 85.3 kg  06/11/22 90.7 kg     Unresulted Labs (From admission, onward)     Start     Ordered   08/01/22 0500  CBC  Daily at 5am,   R       07/31/22 1047   08/01/22 0500  Comprehensive metabolic panel  Daily at 5am,   R      07/31/22 1047          Data Reviewed: I have personally reviewed following labs and imaging studies CBC: Recent Labs  Lab 07/30/22 1757 07/31/22 0638 08/01/22 0453  WBC 10.8* 6.2 6.6  HGB 10.1* 8.6* 8.5*  HCT 31.6* 27.1* 26.7*  MCV 88.5 88.9 91.1  PLT 244 192 809   Basic Metabolic Panel: Recent Labs  Lab 07/30/22 1757 07/31/22 0638 08/01/22 0453  NA 141 142 139  K 3.4* 3.4* 3.1*  CL 116* 115* 111  CO2 18* 21* 19*  GLUCOSE 108* 82 77  BUN '15 13 9  '$ CREATININE 0.82 0.61 0.73  CALCIUM 8.9 8.7* 8.7*  MG 1.7 1.7 1.6*  PHOS  --   --  3.7   GFR: Estimated Creatinine Clearance: 109.3 mL/min (by C-G formula based on SCr of 0.73 mg/dL). Liver Function Tests: Recent Labs  Lab 07/30/22 1757 07/31/22 0638 08/01/22 0453  AST 13* 12* 11*  ALT '13 13 11  '$ ALKPHOS 56 52 53  BILITOT 1.1 1.0 1.1  PROT 7.1 6.8 6.5  ALBUMIN 3.5 3.2* 3.2*   Recent Labs  Lab 07/30/22 1757 07/31/22 0638  LIPASE 139* 193*   No results for input(s): "AMMONIA" in the last 168 hours. Coagulation Profile: Recent Labs  Lab 07/31/22 0638  INR 1.2   BNP (last 3 results) No results for input(s): "PROBNP" in the last 8760 hours. HbA1C: No results for input(s): "HGBA1C" in the last 72 hours. CBG: No results for input(s): "GLUCAP" in the last 168 hours. Lipid Profile: No results for input(s): "CHOL", "HDL", "LDLCALC", "TRIG", "CHOLHDL", "LDLDIRECT" in the last 72 hours. Thyroid Function Tests: No results for input(s): "TSH", "T4TOTAL", "FREET4", "T3FREE", "THYROIDAB" in the last 72 hours. Sepsis Labs: Recent Labs  Lab 07/31/22 0738  LATICACIDVEN 0.9    No results found for this or any previous visit (from the past 240 hour(s)).  Antimicrobials: Anti-infectives (From admission, onward)    Start     Dose/Rate Route Frequency Ordered Stop   07/30/22 1730  cefTRIAXone (ROCEPHIN) 1 g in sodium chloride 0.9  % 100 mL IVPB        1 g 200 mL/hr over 30 Minutes Intravenous  Once 07/30/22 1728 07/30/22 1931      Culture/Microbiology No results found for: "SDES", "SPECREQUEST", "CULT", "REPTSTATUS"  Other culture-see note  Radiology Studies: CT ABDOMEN PELVIS W CONTRAST  Result Date: 07/30/2022 CLINICAL DATA:  Nausea and vomiting with abdominal pain, mild hematemesis EXAM: CT ABDOMEN AND PELVIS WITH CONTRAST TECHNIQUE: Multidetector CT imaging of the abdomen and pelvis was performed using the standard protocol following bolus administration of intravenous contrast. RADIATION DOSE REDUCTION: This exam was performed according to the departmental dose-optimization program which includes automated exposure control, adjustment of the mA and/or kV according to patient size and/or use of iterative reconstruction technique. CONTRAST:  184m OMNIPAQUE IOHEXOL 300 MG/ML  SOLN COMPARISON:  07/04/2022 FINDINGS: Lower chest: No acute abnormality. Hepatobiliary: No focal liver abnormality is seen. No gallstones, gallbladder wall thickening, or biliary dilatation. Pancreas: There are peripancreatic inflammatory changes identified in the region of the head of the pancreas consistent with mild acute pancreatitis. Somewhat mottled appearance of the pancreas is noted stable in appearance from prior exams. Multiple small pseudo cysts are again identified and stable. Spleen: Normal in size without focal abnormality. Adrenals/Urinary Tract: Adrenal glands are within normal limits. Kidneys demonstrate a normal enhancement pattern bilaterally. No renal calculi or obstructive changes are seen. The bladder is well distended. Stomach/Bowel: The appendix is within normal limits. No obstructive or inflammatory changes of the colon are seen. Stomach is within normal limits. No small bowel abnormality is noted. Vascular/Lymphatic: Changes consistent with chronic type B dissection of the abdominal aorta are seen. This is stable in appearance  from the prior exam and extends to the level of the iliac bifurcation and into the right common iliac artery. No adenopathy is noted. Some venous collaterals from the splenic vein are noted related to long-term splenic vein narrowing. Reproductive: Uterus again demonstrates fibroid change. No adnexal mass is noted. Other: No abdominal wall hernia or abnormality. No abdominopelvic ascites. Musculoskeletal: No acute or significant osseous findings. IMPRESSION: Changes  of mild acute pancreatitis involving the pancreatic head. Changes of previous pancreatitis with multiple small peripancreatic pseudocysts are again identified. 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 Fibroid uterus. Electronically Signed   By: Inez Catalina M.D.   On: 07/30/2022 21:35     LOS: 0 days   Antonieta Pert, MD [facility]  08/01/2022, 11:29 AM

## 2022-08-01 NOTE — Progress Notes (Signed)
Robin Guerrero 9:17 AM  Subjective: Patient without any further bleeding and no new complaints and unfortunately never touch base with the IR team her outpatient appointment yesterday  Objective: Vital signs stable afebrile no acute distress exam please see preassessment evaluation hemoglobin stable labs okay  Assessment: Upper GI bleed in a patient with chronic pancreatitis and history of gastric varices  Plan: Okay to proceed with endoscopy today with anesthesia assistance  [facility]  office [phone] After 5PM or if no answer call [phone]

## 2022-08-01 NOTE — Op Note (Signed)
[facility] Patient Name: Robin Guerrero Procedure Date: 08/01/2022 MRN: [id number] Attending MD: Clarene Essex , MD Date of Birth: [birth year] CSN: [id number] Age: 36 Admit Type: Inpatient Procedure:                Upper GI endoscopy Indications:              Hematemesis Providers:                Clarene Essex, MD, Dulcy Fanny, Cletis Athens,                            Technician Referring MD:              Medicines:                Monitored Anesthesia Care Complications:            No immediate complications. Estimated Blood Loss:     Estimated blood loss: none. Procedure:                Pre-Anesthesia Assessment:                           - Prior to the procedure, a History and Physical                            was performed, and patient medications and                            allergies were reviewed. The patient's tolerance of                            previous anesthesia was also reviewed. The risks                            and benefits of the procedure and the sedation                            options and risks were discussed with the patient.                            All questions were answered, and informed consent                            was obtained. Prior Anticoagulants: The patient has                            taken no previous anticoagulant or antiplatelet                            agents. ASA Grade Assessment: III - A patient with                            severe systemic disease. After reviewing the risks                            and benefits, the  patient was deemed in                            satisfactory condition to undergo the procedure.                           After obtaining informed consent, the endoscope was                            passed under direct vision. Throughout the                            procedure, the patient's blood pressure, pulse, and                            oxygen saturations were monitored  continuously. The                            [id number]-H190 ([id number] Olympus endoscope was introduced                            through the mouth, and advanced to the third part                            of duodenum. The upper GI endoscopy was                            accomplished without difficulty. The patient                            tolerated the procedure well. Scope In: Scope Out: Findings:      A small hiatal hernia was present.      Varices with no bleeding were found in the gastric fundus. There were no       stigmata of recent bleeding. They were small in largest diameter.      The duodenal bulb, first portion of the duodenum, second portion of the       duodenum and third portion of the duodenum were normal.      The exam was otherwise without abnormality. Impression:               - Small hiatal hernia.                           - Gastric varices, without bleeding.                           - Normal duodenal bulb, first portion of the                            duodenum, second portion of the duodenum and third                            portion of the duodenum.                           -  The examination was otherwise normal.                           - No specimens collected. Moderate Sedation:      Not Applicable - Patient had care per Anesthesia. Recommendation:           - Soft diet today.                           - Continue present medications.                           - Return to GI clinic as previously scheduled.                           - Telephone GI clinic if symptomatic PRN. I                            discussed her case with IR and based on her CT scan                            they do not have anything to offer her from a TIPS                            or BRTO standpoint and think if gastric varices are                            to blame for her bleeding which is hard to                            definitively say at this point we need a                             splenectomy and with her aorta problem complicating                            things more they think she is a candidate for                            [facility] care which I agree with on top of her                            chronic pancreatitis issues Procedure Code(s):        --- Professional ---                           [phone], Esophagogastroduodenoscopy, flexible,                            transoral; diagnostic, including collection of                            specimen(s) by brushing or washing, when performed                            (  separate procedure) Diagnosis Code(s):        --- Professional ---                           K44.9, Diaphragmatic hernia without obstruction or                            gangrene                           I86.4, Gastric varices                           K92.0, Hematemesis CPT copyright 2019 [employer]. All rights reserved. The codes documented in this report are preliminary and upon coder review may  be revised to meet current compliance requirements. Clarene Essex, MD 08/01/2022 10:21:22 AM This report has been signed electronically. Number of Addenda: 0

## 2022-08-01 NOTE — Transfer of Care (Signed)
Immediate Anesthesia Transfer of Care Note  Patient: Robin Guerrero  Procedure(s) Performed: ESOPHAGOGASTRODUODENOSCOPY (EGD) WITH PROPOFOL  Patient Location: PACU  Anesthesia Type:MAC  Level of Consciousness: awake, alert , oriented and patient cooperative  Airway & Oxygen Therapy: Patient Spontanous Breathing and Patient connected to face mask oxygen  Post-op Assessment: Report given to RN, Post -op Vital signs reviewed and stable and Patient moving all extremities X 4  Post vital signs: Reviewed and stable  Last Vitals:  Vitals Value Taken Time  BP    Temp    Pulse 134 08/01/22 0952  Resp 17 08/01/22 0952  SpO2 99 % 08/01/22 0952  Vitals shown include unvalidated device data.  Last Pain:  Vitals:   08/01/22 0801  TempSrc: Temporal  PainSc: 6       Patients Stated Pain Goal: 0 (15/05/69 7948)  Complications: No notable events documented.

## 2022-08-02 DIAGNOSIS — K922 Gastrointestinal hemorrhage, unspecified: Secondary | ICD-10-CM | POA: Diagnosis not present

## 2022-08-02 LAB — CBC
HCT: 23.9 % — ABNORMAL LOW (ref 36.0–46.0)
Hemoglobin: 7.8 g/dL — ABNORMAL LOW (ref 12.0–15.0)
MCH: 28.7 pg (ref 26.0–34.0)
MCHC: 32.6 g/dL (ref 30.0–36.0)
MCV: 87.9 fL (ref 80.0–100.0)
Platelets: 177 10*3/uL (ref 150–400)
RBC: 2.72 MIL/uL — ABNORMAL LOW (ref 3.87–5.11)
RDW: 22.3 % — ABNORMAL HIGH (ref 11.5–15.5)
WBC: 5.9 10*3/uL (ref 4.0–10.5)
nRBC: 0 % (ref 0.0–0.2)

## 2022-08-02 LAB — COMPREHENSIVE METABOLIC PANEL
ALT: 10 U/L (ref 0–44)
AST: 12 U/L — ABNORMAL LOW (ref 15–41)
Albumin: 2.8 g/dL — ABNORMAL LOW (ref 3.5–5.0)
Alkaline Phosphatase: 48 U/L (ref 38–126)
Anion gap: 4 — ABNORMAL LOW (ref 5–15)
BUN: 8 mg/dL (ref 6–20)
CO2: 20 mmol/L — ABNORMAL LOW (ref 22–32)
Calcium: 8.6 mg/dL — ABNORMAL LOW (ref 8.9–10.3)
Chloride: 117 mmol/L — ABNORMAL HIGH (ref 98–111)
Creatinine, Ser: 0.8 mg/dL (ref 0.44–1.00)
GFR, Estimated: >60 mL/min (ref >60)
Glucose, Bld: 105 mg/dL — ABNORMAL HIGH (ref 70–99)
Potassium: 3.8 mmol/L (ref 3.5–5.1)
Sodium: 141 mmol/L (ref 135–145)
Total Bilirubin: 0.7 mg/dL (ref 0.3–1.2)
Total Protein: 5.9 g/dL — ABNORMAL LOW (ref 6.5–8.1)

## 2022-08-02 MED ORDER — PANTOPRAZOLE SODIUM 40 MG PO TBEC: 40.0000 mg | DELAYED_RELEASE_TABLET | Freq: Two times a day (BID) | ORAL | 0 refills | Status: DC

## 2022-08-02 MED ORDER — DICYCLOMINE HCL 10 MG PO CAPS: 10.0000 mg | ORAL_CAPSULE | Freq: Three times a day (TID) | ORAL | 0 refills | Status: DC | PRN

## 2022-08-02 MED ORDER — ALUMINUM & MAGNESIUM HYDROXIDE 200-200 MG/5ML PO SUSP: 15.0000 mL | Freq: Four times a day (QID) | ORAL | 0 refills | Status: DC | PRN

## 2022-08-02 MED ORDER — PANTOPRAZOLE SODIUM 40 MG PO TBEC
40.0000 mg | DELAYED_RELEASE_TABLET | Freq: Every day | ORAL | Status: DC
  Administered 2022-08-02: 40 mg via ORAL
  Filled 2022-08-02: qty 1

## 2022-08-02 NOTE — Discharge Summary (Signed)
Physician Discharge Summary  Robin Guerrero ZTI:[id number] DOB: 01/08/1986 DOA: 07/30/2022  PCP: Trey Sailors, PA  Admit date: 07/30/2022 Discharge date: 08/02/2022 Recommendations for Outpatient Follow-up:  Follow up with PCP in 1 weeks-call for appointment Follow-up with [facility] GI Dr. Therisa Doyne for referral to [facility] and pain clinic Please obtain BMP/CBC in one week  Discharge Dispo: home Discharge Condition: Stable Code Status:   Code Status: Full Code Diet recommendation:  Diet Order             DIET DYS 3 Room service appropriate? Yes; Fluid consistency: Thin  Diet effective now                    Brief/Interim Summary: 36-yof w/ PMH of chronic pancreatitis, GERD,hypertension, type B aortic dissection presented with complaints of abdominal pain and hematemesis- 6-7 episodes of dark red hematemesis 8/15 afternoon.  Prior Hx EtOH abuse, sober x 1 year.  Had EGD in June with varices done by Dr. Rosalie Gums with varix noted.  Patient actually follows with [facility]. 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 EDP D/W  Dr. Henrene Pastor - but pt of [facility] so Dr Watt Climes was consulted.  Underwent EGD no significant findings in the report.  Post EGD had issue with tachycardia hypertension pain that is stabilized with supportive care.  Diet advanced at this time tolerating soft diet, discussed with GI and okay for discharge-we will need outpatient pain clinic follow-up as well as referral to tertiary care which GI is working on.    Discharge Diagnoses:  Principal Problem:   Acute upper GI bleed Active Problems:   Acute pancreatitis   Essential hypertension   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HCC)   GERD without esophagitis   Hypokalemia   Pain in the abdomen  Nausea/Vomiting and Hematemesis Abdominal pain GERD  without esophagitis: S/p EGD- see report below.Post egd having significant hypertension, tachycardia  epigastric abdomen pain nausea- rapid response called after EGD given her tachycardia nausea-[place] subsequently settled down with pain medication comprising chest x-ray, abdominal x-ray, CBC BMP amylase lipase were done-fairly stable with a lipase decreasing amylase was also elevated seen by Dr. Watt Climes no further recommendation advanced diet, patient tolerating well-discussed with Dr. Watt Climes this morning went over blood work, no further recommendation okay to discharge home.  GI clinic is working on pain clinic referral as well as referral to [place] pancreatitis-CT scan with mild acute pancreatitis.  Lipase mildly elevated, continue symptomatic management> tolerating diet, okay for discharge   Essential hypertension HTN Urgency: Blood pressure was elevated likely from not taking clonidine, at this time BP has stabilized she is on multiple regimen and continue the same   Descending thoracic aortic dissection: stable per CT on admit.  Outpatient follow-up Hypokalemia: Repleted   EGD: IMpression -Gastric varices, without bleeding. - Normal duodenal bulb, first portion of the duodenum, second portion of the duodenum and third portion of the duodenum. - The examination was otherwise normal. - No specimens collected. Recs: - Soft diet today. - Continue present medications. - Return to GI clinic as previously scheduled. - Telephone GI clinic if symptomatic PRN. I discussed her case with IR and based on her CT scan they do not have anything to offer her from a TIPS or BRTO standpoint and think if gastric varices are to blame for her bleeding which is  hard to definitively say at this point we need a splenectomy and with her aorta problem complicating things more they think she is a candidate for [facility] care which I agree with on top of her chronic pancreatitis  issues Consults: GI Subjective: Alert awake oriented no abdominal pain tolerating diet. She feels ready for discharge today  Discharge Exam: Vitals:   08/02/22 0244 08/02/22 0635  BP: 108/79 123/86  Pulse: 92 92  Resp: 16 16  Temp: 98.5 F (36.9 C) 98.5 F (36.9 C)  SpO2: 94% 95%   General: Pt is alert, awake, not in acute distress Cardiovascular: RRR, S1/S2 +, no rubs, no gallops Respiratory: CTA bilaterally, no wheezing, no rhonchi Abdominal: Soft, NT, ND, bowel sounds + Extremities: no edema, no cyanosis  Discharge Instructions  Discharge Instructions     Discharge instructions   Complete by: As directed    Please call call MD or return to ER for similar or worsening recurring problem that brought you to hospital or if any fever,nausea/vomiting,abdominal pain, uncontrolled pain, chest pain,  shortness of breath or any other alarming symptoms.  Please follow-up your doctor as instructed in a week time and call the office for appointment.  Please avoid alcohol, smoking, or any other illicit substance and maintain healthy habits including taking your regular medications as prescribed.  You were cared for by a hospitalist during your hospital stay. If you have any questions about your discharge medications or the care you received while you were in the hospital after you are discharged, you can call the unit and ask to speak with the hospitalist on call if the hospitalist that took care of you is not available.  Once you are discharged, your primary care physician will handle any further medical issues. Please note that NO REFILLS for any discharge medications will be authorized once you are discharged, as it is imperative that you return to your primary care physician (or establish a relationship with a primary care physician if you do not have one) for your aftercare needs so that they can reassess your need for medications and monitor your lab values   Increase activity  slowly   Complete by: As directed       Allergies as of 08/02/2022       Reactions   Ativan [lorazepam] Other (See Comments)   Disorientated, combative        Medication List     TAKE these medications    aluminum-magnesium hydroxide 200-200 MG/5ML suspension Take 15 mLs by mouth every 6 (six) hours as needed for indigestion.   amLODipine 10 MG tablet Commonly known as: NORVASC Take 1 tablet (10 mg total) by mouth daily.   cloNIDine 0.2 MG tablet Commonly known as: CATAPRES Take 1 tablet (0.2 mg total) by mouth every 8 (eight) hours.   cyanocobalamin 1000 MCG tablet Take 1 tablet (1,000 mcg total) by mouth daily.   dicyclomine 10 MG capsule Commonly known as: BENTYL Take 1 capsule (10 mg total) by mouth 3 (three) times daily as needed for spasms.   ferrous sulfate 325 (65 FE) MG tablet TAKE 1 TABLET BY MOUTH EVERY DAY WITH BREAKFAST   hydrALAZINE 100 MG tablet Commonly known as: APRESOLINE Take 1 tablet (100 mg total) by mouth 3 (three) times daily.   Zenpep 40000-126000 units Cpep Generic drug: Pancrelipase (Lip-Prot-Amyl) Take by mouth. 2 tablets with each  meal and 1 tablet with snacks   lipase/protease/amylase 36000 UNITS Cpep capsule Commonly known as: CREON Take 1  capsule (36,000 Units total) by mouth 3 (three) times daily before meals.   losartan 100 MG tablet Commonly known as: COZAAR Take 1 tablet (100 mg total) by mouth daily.   ondansetron 4 MG tablet Commonly known as: Zofran Take 1 tablet (4 mg total) by mouth every 8 (eight) hours as needed for nausea or vomiting.   pantoprazole 40 MG tablet Commonly known as: Protonix Take 1 tablet (40 mg total) by mouth 2 (two) times daily.   polyethylene glycol 17 g packet Commonly known as: MIRALAX / GLYCOLAX Take 17 g by mouth daily.   promethazine 12.5 MG tablet Commonly known as: PHENERGAN Take 1 tablet (12.5 mg total) by mouth every 8 (eight) hours as needed for nausea or vomiting.    propranolol 10 MG tablet Commonly known as: INDERAL Take 1 tablet (10 mg total) by mouth 2 (two) times daily.        Follow-up Information     Trey Sailors, PA Follow up in 1 week(s).   Specialty: Physician Assistant Contact information: [place] [address] [phone]         Ronnette Juniper, MD Follow up in 1 week(s).   Specialty: Gastroenterology Contact information: [place] [address] [phone]                Allergies  Allergen Reactions   Ativan [Lorazepam] Other (See Comments)    Disorientated, combative    The results of significant diagnostics from this hospitalization (including imaging, microbiology, ancillary and laboratory) are listed below for reference.    Microbiology: No results found for this or any previous visit (from the past 240 hour(s)).  Procedures/Studies: DG Abd Portable 1V  Result Date: 08/01/2022 CLINICAL DATA:  Shortness of breath, abdominal pain EXAM: PORTABLE ABDOMEN - 1 VIEW COMPARISON:  08/20/2020 FINDINGS: Bowel gas pattern is nonspecific. There is no significant amount of stool in colon. No abnormal masses or calcifications are seen. Kidneys are partly obscured by bowel contents. Visualized lower lung fields are clear the IMPRESSION: Nonspecific bowel gas pattern. Electronically Signed   By: Elmer Picker M.D.   On: 08/01/2022 12:52   DG Chest Port 1 View  Result Date: 08/01/2022 CLINICAL DATA:  Shortness of breath. EXAM: PORTABLE CHEST 1 VIEW COMPARISON:  02/28/2022 FINDINGS: 1214 hours. The lungs are clear without focal pneumonia, edema, pneumothorax or pleural effusion. The cardiopericardial silhouette is within normal limits for size. The visualized bony structures of the thorax are unremarkable. IMPRESSION: Low volume film without acute cardiopulmonary findings. Electronically Signed   By: Misty Stanley M.D.   On: 08/01/2022 12:41   CT ABDOMEN PELVIS W  CONTRAST  Result Date: 07/30/2022 CLINICAL DATA:  Nausea and vomiting with abdominal pain, mild hematemesis EXAM: CT ABDOMEN AND PELVIS WITH CONTRAST TECHNIQUE: Multidetector CT imaging of the abdomen and pelvis was performed using the standard protocol following bolus administration of intravenous contrast. RADIATION DOSE REDUCTION: This exam was performed according to the departmental dose-optimization program which includes automated exposure control, adjustment of the mA and/or kV according to patient size and/or use of iterative reconstruction technique. CONTRAST:  155m OMNIPAQUE IOHEXOL 300 MG/ML  SOLN COMPARISON:  07/04/2022 FINDINGS: Lower chest: No acute abnormality. Hepatobiliary: No focal liver abnormality is seen. No gallstones, gallbladder wall thickening, or biliary dilatation. Pancreas: There are peripancreatic inflammatory changes identified in the region of the head of the pancreas consistent with mild acute pancreatitis. Somewhat mottled appearance of the pancreas is noted  stable in appearance from prior exams. Multiple small pseudo cysts are again identified and stable. Spleen: Normal in size without focal abnormality. Adrenals/Urinary Tract: Adrenal glands are within normal limits. Kidneys demonstrate a normal enhancement pattern bilaterally. No renal calculi or obstructive changes are seen. The bladder is well distended. Stomach/Bowel: The appendix is within normal limits. No obstructive or inflammatory changes of the colon are seen. Stomach is within normal limits. No small bowel abnormality is noted. Vascular/Lymphatic: Changes consistent with chronic type B dissection of the abdominal aorta are seen. This is stable in appearance from the prior exam and extends to the level of the iliac bifurcation and into the right common iliac artery. No adenopathy is noted. Some venous collaterals from the splenic vein are noted related to long-term splenic vein narrowing. Reproductive: Uterus again  demonstrates fibroid change. No adnexal mass is noted. Other: No abdominal wall hernia or abnormality. No abdominopelvic ascites. Musculoskeletal: No acute or significant osseous findings. IMPRESSION: Changes of mild acute pancreatitis involving the pancreatic head. Changes of previous pancreatitis with multiple small peripancreatic pseudocysts are again identified. 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 Fibroid uterus. Electronically Signed   By: Inez Catalina M.D.   On: 07/30/2022 21:35   MR ABDOMEN MRCP W WO CONTAST  Result Date: 07/15/2022 CLINICAL DATA:  Abdominal pain, alcoholic pancreatitis, pseudocyst EXAM: MRI ABDOMEN WITHOUT AND WITH CONTRAST (INCLUDING MRCP) TECHNIQUE: Multiplanar multisequence MR imaging of the abdomen was performed both before and after the administration of intravenous contrast. Heavily T2-weighted images of the biliary and pancreatic ducts were obtained, and three-dimensional MRCP images were rendered by post processing. CONTRAST:  28m GADAVIST GADOBUTROL 1 MMOL/ML IV SOLN COMPARISON:  CTA chest abdomen pelvis, 07/04/2022, CT abdomen pelvis, 06/03/2022 FINDINGS: Lower chest: No acute findings. Hepatobiliary: No mass or other parenchymal abnormality identified. No gallstones. No biliary ductal dilatation. Pancreas: Continued interval decrease in size of a hemorrhagic or proteinaceous fluid collection superior to the pancreatic head and neck, adjacent to the caudate lobe of the liver, measuring 2.1 x 1.9 cm, previously 3.7 x 2.8 cm (series 18, image 40). Additional small cystic lesions within and adjacent to the pancreatic neck, body, and tail are unchanged, largest superior to the pancreatic body measuring 1.9 x 1.8 cm (series 26, image 43). No mass or acute inflammatory findings. No pancreatic ductal dilatation. Spleen:  Mild splenomegaly, maximum span 13.2 cm. Adrenals/Urinary Tract: Normal adrenal glands. No renal masses or suspicious contrast enhancement  identified. No evidence of hydronephrosis. Stomach/Bowel: Visualized portions within the abdomen are unremarkable. Vascular/Lymphatic: No pathologically enlarged lymph nodes identified. 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 The central portal vein is sharply narrowed and nearly effaced adjacent to the pancreatic head (series 30, image 50). Varices about the left upper quadrant (series 30, image 43). Other:  None. Musculoskeletal: No suspicious osseous lesions identified. IMPRESSION: 1. Continued interval decrease in size of a hemorrhagic or proteinaceous fluid collection superior to the pancreatic head and neck, adjacent to the caudate lobe of the liver, measuring 2.1 x 1.9 cm, previously 3.7 x 2.8 cm. This is consistent with a resolving acute pancreatic fluid collection/pseudocyst. 2. Additional small cystic lesions within and adjacent to the pancreatic neck, body, and tail are unchanged, consistent with chronic pseudocysts. 3. No acute inflammatory findings of pancreatitis at this time. 4. The central portal vein is sharply narrowed and nearly effaced adjacent to the pancreatic head, with  varices about the left upper quadrant suggesting portal hypertension. 5. 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 Electronically Signed   By: Delanna Ahmadi M.D.   On: 07/15/2022 11:00   MR 3D Recon At Scanner  Result Date: 07/15/2022 CLINICAL DATA:  Abdominal pain, alcoholic pancreatitis, pseudocyst EXAM: MRI ABDOMEN WITHOUT AND WITH CONTRAST (INCLUDING MRCP) TECHNIQUE: Multiplanar multisequence MR imaging of the abdomen was performed both before and after the administration of intravenous contrast. Heavily T2-weighted images of the biliary and pancreatic ducts were obtained, and three-dimensional MRCP images were rendered by post processing.  CONTRAST:  48m GADAVIST GADOBUTROL 1 MMOL/ML IV SOLN COMPARISON:  CTA chest abdomen pelvis, 07/04/2022, CT abdomen pelvis, 06/03/2022 FINDINGS: Lower chest: No acute findings. Hepatobiliary: No mass or other parenchymal abnormality identified. No gallstones. No biliary ductal dilatation. Pancreas: Continued interval decrease in size of a hemorrhagic or proteinaceous fluid collection superior to the pancreatic head and neck, adjacent to the caudate lobe of the liver, measuring 2.1 x 1.9 cm, previously 3.7 x 2.8 cm (series 18, image 40). Additional small cystic lesions within and adjacent to the pancreatic neck, body, and tail are unchanged, largest superior to the pancreatic body measuring 1.9 x 1.8 cm (series 26, image 43). No mass or acute inflammatory findings. No pancreatic ductal dilatation. Spleen:  Mild splenomegaly, maximum span 13.2 cm. Adrenals/Urinary Tract: Normal adrenal glands. No renal masses or suspicious contrast enhancement identified. No evidence of hydronephrosis. Stomach/Bowel: Visualized portions within the abdomen are unremarkable. Vascular/Lymphatic: No pathologically enlarged lymph nodes identified. 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 The central portal vein is sharply narrowed and nearly effaced adjacent to the pancreatic head (series 30, image 50). Varices about the left upper quadrant (series 30, image 43). Other:  None. Musculoskeletal: No suspicious osseous lesions identified. IMPRESSION: 1. Continued interval decrease in size of a hemorrhagic or proteinaceous fluid collection superior to the pancreatic head and neck, adjacent to the caudate lobe of the liver, measuring 2.1 x 1.9 cm, previously 3.7 x 2.8 cm. This is consistent with a resolving acute pancreatic fluid collection/pseudocyst. 2. Additional small cystic lesions within and adjacent to the pancreatic neck, body, and tail are  unchanged, consistent with chronic pseudocysts. 3. No acute inflammatory findings of pancreatitis at this time. 4. The central portal vein is sharply narrowed and nearly effaced adjacent to the pancreatic head, with varices about the left upper quadrant suggesting portal hypertension. 5. 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 Electronically Signed   By: ADelanna AhmadiM.D.   On: 07/15/2022 11:00   CTA CHEST/ABD/PEL DISSECTION W&/OR WO & GATING  Result Date: 07/05/2022 CLINICAL DATA:  Chronic type B thoracic aortic dissection EXAM: CT ANGIOGRAPHY CHEST, ABDOMEN AND PELVIS TECHNIQUE: Non-contrast CT of the chest was initially obtained. Multidetector CT imaging through the chest, abdomen and pelvis was performed using the standard protocol during bolus administration of intravenous contrast. Multiplanar reconstructed images and MIPs were obtained and reviewed to evaluate the vascular anatomy. RADIATION DOSE REDUCTION: This exam was performed according to the departmental dose-optimization program which includes automated exposure control, adjustment of the mA and/or kV according to patient size and/or use of iterative reconstruction technique. CONTRAST:  1086mOMNIPAQUE IOHEXOL 350 MG/ML SOLN COMPARISON:  CT scan of the abdomen and pelvis 06/06/2022; most recent prior CT scan  of the chest, abdomen and pelvis 03/29/2022 FINDINGS: CTA CHEST FINDINGS Cardiovascular: 2 vessel aortic arch. The right brachiocephalic and left common carotid artery share common origin. The aortic root and ascending thoracic aorta remain normal in appearance. Chronic Stanford type B dissection again noted beginning in the aortic isthmus just beyond the origin of the left subclavian artery. There is mild aneurysmal dilation of the proximal descending thoracic aorta with a maximal diameter of 4.5 cm which remains unchanged. The dissection flap extends throughout  the remaining descending thoracic aorta. Both the true and false lumen are equally opacified. No evidence of thrombus formation. The main pulmonary artery is normal in caliber. No evidence of central PE. The heart is normal in size. No pericardial effusion. Mediastinum/Nodes: No enlarged mediastinal, hilar, or axillary lymph nodes. Thyroid gland, trachea, and esophagus demonstrate no significant findings. Lungs/Pleura: Small 3 mm right upper lobe pulmonary nodule is almost certainly benign and warrants no further follow-up. The lungs are otherwise clear. Musculoskeletal: No acute fracture or aggressive appearing lytic or blastic osseous lesion. Review of the MIP images confirms the above findings. CTA ABDOMEN AND PELVIS FINDINGS VASCULAR Aorta: Dissection flap extends throughout the abdominal aorta in into the right common iliac artery. No evidence of stable mild aneurysmal dilation of the visceral aorta with a maximal diameter of 3.4 cm. The aorta then tapers to normal caliber. 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 No evidence of aneurysm or dissection. Conventional hepatic artery anatomy. SMA: Arises from the true lumen.  No dissection or aneurysm. Renals: There are 2 renal arteries bilaterally. No evidence of dissection extension into the renal arteries. No evidence of aneurysm, stenosis or fibromuscular dysplasia. IMA: Widely patent and unremarkable. Inflow: The dissection flap extends into the right common iliac artery which is mildly aneurysmal at 1.6 cm. The dissection flap terminates at the bifurcation. No evidence of dissection extension into the left iliac system. Veins: No obvious venous abnormality within the limitations of this arterial phase study. Review of the MIP images confirms the above findings. NON-VASCULAR Hepatobiliary: Decreasing size of pancreatic pseudocyst in the region of the caudate lobe of the liver now measuring only 4.1 x 2.4 cm compared to 6.7 x 4.8 cm.  Mildly heterogeneous hepatic parenchyma. Geographic hypoattenuation in the left hemi-liver adjacent to the fissure for the falciform ligament is nonspecific but most suggestive of benign focal fatty infiltration. No discrete solid lesion. Gallbladder is unremarkable. No intra or extrahepatic biliary ductal dilatation. Pancreas: Changes of chronic pancreatitis without evidence of active inflammation. The pancreas is diffusely heterogeneous. Multiple focal areas was ductal dilatation. Small peripancreatic pseudocysts are stable. Spleen: Stable borderline splenomegaly. Adrenals/Urinary Tract: Adrenal glands are unremarkable. Kidneys are normal, without renal calculi, focal lesion, or hydronephrosis. Bladder is unremarkable. Stomach/Bowel: Stomach is within normal limits. Appendix appears normal. No evidence of bowel wall thickening, distention, or inflammatory changes. Lymphatic: No suspicious lymphadenopathy. Reproductive: Globular heterogeneous enlargement of the uterus most consistent with uterine fibroids. No definite adnexal mass. Trace free fluid in the pelvis is likely within normal limits for a physiologic age female. Other: Trace physiologic free fluid.  No abdominal wall hernia. Musculoskeletal: No acute fracture or aggressive appearing lytic or blastic osseous lesion. Review of the MIP images confirms the above findings. IMPRESSION: 1. 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 Recommend follow-up every  3 years. This recommendation follows ACR consensus guidelines: White Paper of the ACR Incidental Findings Committee II on Vascular Findings. J Am Coll Radiol 2013; 10:789-794. 2. Improving perihepatic pancreatic pseudocyst compared to 06/03/2022. 3. Changes of chronic pancreatitis without evidence of active inflammation. 4. Additional  ancillary findings as above without interval change. Signed, Criselda Peaches, MD, [facility] Electronically Signed   By: Jacqulynn Cadet M.D.   On: 07/05/2022 08:01    Labs: BNP (last 3 results) No results for input(s): "BNP" in the last 8760 hours. Basic Metabolic Panel: Recent Labs  Lab 07/30/22 1757 07/31/22 0638 08/01/22 0453 08/01/22 1239 08/02/22 0501  NA 141 142 139 142 141  K 3.4* 3.4* 3.1* 3.2* 3.8  CL 116* 115* 111 112* 117*  CO2 18* 21* 19* 18* 20*  GLUCOSE 108* 82 77 103* 105*  BUN '15 13 9 8 8  '$ CREATININE 0.82 0.61 0.73 0.77 0.80  CALCIUM 8.9 8.7* 8.7* 8.9 8.6*  MG 1.7 1.7 1.6*  --   --   PHOS  --   --  3.7  --   --    Liver Function Tests: Recent Labs  Lab 07/30/22 1757 07/31/22 0638 08/01/22 0453 08/01/22 1239 08/02/22 0501  AST 13* 12* 11* 14* 12*  ALT '13 13 11 13 10  '$ ALKPHOS 56 52 53 56 48  BILITOT 1.1 1.0 1.1 1.3* 0.7  PROT 7.1 6.8 6.5 7.0 5.9*  ALBUMIN 3.5 3.2* 3.2* 3.5 2.8*   Recent Labs  Lab 07/30/22 1757 07/31/22 0638 08/01/22 1239  LIPASE 139* 193* 139*  AMYLASE  --   --  295*   No results for input(s): "AMMONIA" in the last 168 hours. CBC: Recent Labs  Lab 07/30/22 1757 07/31/22 0638 08/01/22 0453 08/01/22 1239 08/02/22 0501  WBC 10.8* 6.2 6.6 10.8* 5.9  HGB 10.1* 8.6* 8.5* 9.1* 7.8*  HCT 31.6* 27.1* 26.7* 28.2* 23.9*  MCV 88.5 88.9 91.1 88.1 87.9  PLT 244 192 208 228 177   Cardiac Enzymes: No results for input(s): "CKTOTAL", "CKMB", "CKMBINDEX", "TROPONINI" in the last 168 hours. BNP: Invalid input(s): "POCBNP" CBG: No results for input(s): "GLUCAP" in the last 168 hours. D-Dimer No results for input(s): "DDIMER" in the last 72 hours. Hgb A1c No results for input(s): "HGBA1C" in the last 72 hours. Lipid Profile No results for input(s): "CHOL", "HDL", "LDLCALC", "TRIG", "CHOLHDL", "LDLDIRECT" in the last 72 hours. Thyroid function studies No results  for input(s): "TSH", "T4TOTAL", "T3FREE", "THYROIDAB" in the last 72 hours.  Invalid input(s): "FREET3" Anemia work up Recent Labs    07/31/22 0638  TIBC 286  IRON 63   Urinalysis    Component Value Date/Time   COLORURINE AMBER (A) 07/30/2022 [place] 07/30/2022 1744   LABSPEC 1.032 (H) 07/30/2022 1744   PHURINE 5.0 07/30/2022 1744   GLUCOSEU NEGATIVE 07/30/2022 1744   HGBUR LARGE (A) 07/30/2022 1744   BILIRUBINUR NEGATIVE 07/30/2022 1744   KETONESUR 5 (A) 07/30/2022 1744   PROTEINUR 30 (A) 07/30/2022 1744   UROBILINOGEN 1.0 04/13/2014 1138   NITRITE NEGATIVE 07/30/2022 1744   LEUKOCYTESUR NEGATIVE 07/30/2022 1744   Sepsis Labs Recent Labs  Lab 07/31/22 6295 08/01/22 0453 08/01/22 1239 08/02/22 0501  WBC 6.2 6.6 10.8* 5.9   Microbiology No results found for this or any previous visit (from the past 240 hour(s)).   Time coordinating discharge: 25 minutes  SIGNED: Antonieta Pert, MD  [facility] 08/02/2022, 10:37 AM  If 7PM-7AM, please contact night-coverage [email or web address]

## 2022-08-02 NOTE — Plan of Care (Signed)
Patient discharging home via private vehicle. Awaiting MD note for work and then will leave unit. Ivan Anchors, RN 08/02/22 12:35 PM

## 2022-08-02 NOTE — Progress Notes (Signed)
Robin Guerrero 10:33 AM  Subjective: Patient seen and examined and she is in much better spirits decreased pain wants to go home eating soft solids fine no new complaints no bowel movements no signs of bleeding  Objective: Vital signs stable afebrile no acute distress abdomen is soft nontender BUN and creatinine okay LFTs okay hemoglobin minimal drop white count okay  Assessment: Multiple medical problems currently stable  Plan: Okay for discharge and close outpatient follow-up which she has an appointment with my partner Dr. Roena Malady and I have discussed referrals with her nurse as well and case discussed with the hospital team  [facility]  office [phone] After 5PM or if no answer call [phone]

## 2022-08-04 ENCOUNTER — Encounter (HOSPITAL_COMMUNITY): Payer: Self-pay | Admitting: Gastroenterology

## 2022-08-19 ENCOUNTER — Other Ambulatory Visit: Payer: Self-pay | Admitting: Cardiology

## 2022-08-19 DIAGNOSIS — I10 Essential (primary) hypertension: Secondary | ICD-10-CM

## 2022-08-30 ENCOUNTER — Ambulatory Visit
Admission: EM | Admit: 2022-08-30 | Discharge: 2022-08-30 | Disposition: A | Discharge status: Home / Self Care | Payer: 59 | Attending: Physician Assistant | Admitting: Physician Assistant

## 2022-08-30 DIAGNOSIS — Z1152 Encounter for screening for COVID-19: Secondary | ICD-10-CM

## 2022-08-30 DIAGNOSIS — J069 Acute upper respiratory infection, unspecified: Secondary | ICD-10-CM

## 2022-08-30 LAB — POCT INFLUENZA A/B
Influenza A, POC: NEGATIVE
Influenza B, POC: NEGATIVE

## 2022-08-30 NOTE — ED Triage Notes (Signed)
Pt presents to uc with co of fatigue, sore throat, generalized body aches and pains, hoarseness, nausea. Pt reports unable to keep down otc medications today

## 2022-08-30 NOTE — ED Provider Notes (Signed)
[facility]    CSN: [id number] Arrival date & time: 08/30/22  1730      History   Chief Complaint Chief Complaint  Patient presents with   Chills   Generalized Body Aches    HPI Robin Guerrero is a 36 y.o. female.   Patient here today for evaluation of fatigue, sore throat, body aches and nausea that started today.  She has not taken any medication today other than Tylenol and states she has had trouble keeping that down.  She has not had any diarrhea.  She denies any fever.  The history is provided by the patient.    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   Hypertension    states under control with med., has been on med. x 3 yr.   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   Alcohol use disorder, severe, in early remission ([place]) 07/07/2022   Gastric varices 07/07/2022   Hepatic steatosis 07/07/2022   Chronic generalized abdominal pain 07/07/2022   Abnormal endoscopy of upper gastrointestinal tract 07/07/2022   Portal hypertension ([place]) 07/07/2022   Portal vein thrombosis 07/07/2022   Iron deficiency anemia 06/11/2022   Acute blood loss anemia 06/04/2022   GERD without esophagitis 06/04/2022   History of alcohol abuse 06/04/2022   Pseudocyst, pancreas 06/04/2022   Acute pancreatitis 04/22/2022   Aortic dissection ([place]) 02/20/2022   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   ARF (acute renal failure) ([place]) 06/04/2021   Acute on chronic pancreatitis ([place]) 06/04/2021   H/O aortic dissection 01/31/2021   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[place]) 11/09/2020   Malignant hypertension    Nausea    Back pain    Hypocalcemia 08/65/7846   Acute alcoholic pancreatitis 96/29/5284   Thrombocytopenia ([place]) 08/19/2020   Normocytic  anemia 13/24/4010   Alcoholic pancreatitis 27/25/3664   SIRS (systemic inflammatory response syndrome) ([name]) 08/18/2020   Hypokalemia 08/18/2020   Hypomagnesemia 08/18/2020   Essential hypertension 08/18/2020   Depression 08/18/2020   Tobacco use 08/18/2020    Past Surgical History:  Procedure Laterality Date   ESOPHAGOGASTRODUODENOSCOPY N/A 06/06/2022   Procedure: ESOPHAGOGASTRODUODENOSCOPY (EGD);  Surgeon: Arta Silence, MD;  Location: [facility];  Service: Gastroenterology;  Laterality: N/A;   ESOPHAGOGASTRODUODENOSCOPY N/A 06/14/2022   Procedure: ESOPHAGOGASTRODUODENOSCOPY (EGD);  Surgeon: [name], MD;  Location: [facility];  Service: Gastroenterology;  Laterality: N/A;   ESOPHAGOGASTRODUODENOSCOPY (EGD) WITH PROPOFOL N/A 08/01/2022   Procedure: ESOPHAGOGASTRODUODENOSCOPY (EGD) WITH PROPOFOL;  Surgeon: Clarene Essex, MD;  Location: [facility];  Service: Gastroenterology;  Laterality: N/A;   FOREIGN BODY REMOVAL Left 02/10/2015   Procedure: ATTEMPTED EXPLANTATION OF BIRTH CONTROL DEVICE LEFT ARM;  Surgeon: Johnathan Hausen, MD;  Location: [place];  Service: General;  Laterality: Left;   FOREIGN BODY REMOVAL Left 05/12/2015   Procedure: REMOVAL OF LEFT ARM BIRTH CONTROL DEVICE;  Surgeon: Johnathan Hausen, MD;  Location: [facility];  Service: General;  Laterality: Left;   SUBMANDIBULAR GLAND EXCISION Right 05/01/2009    OB History     Gravida  4   Para  2   Term  2   Preterm      AB  2   Living  2      SAB  IAB  2   Ectopic      Multiple      Live Births  2            Home Medications    Prior to Admission medications   Medication Sig Start Date End Date Taking? Authorizing Provider  aluminum-magnesium hydroxide 200-200 MG/5ML suspension Take 15 mLs by mouth every 6 (six) hours as needed for indigestion. 08/02/22   Antonieta Pert, MD  amLODipine (NORVASC) 10 MG tablet Take 1 tablet (10 mg total) by mouth daily.  03/08/22   Patwardhan, Reynold Bowen, MD  cloNIDine (CATAPRES) 0.2 MG tablet Take 1 tablet (0.2 mg total) by mouth every 8 (eight) hours. 03/08/22 06/12/23  Patwardhan, Reynold Bowen, MD  dicyclomine (BENTYL) 10 MG capsule Take 1 capsule (10 mg total) by mouth 3 (three) times daily as needed for spasms. 08/02/22 09/01/22  Antonieta Pert, MD  ferrous sulfate 325 (65 FE) MG tablet TAKE 1 TABLET BY MOUTH EVERY DAY WITH BREAKFAST Patient not taking: Reported on 07/30/2022 03/25/22   Patwardhan, Reynold Bowen, MD  hydrALAZINE (APRESOLINE) 100 MG tablet TAKE 1 TABLET BY MOUTH 3 TIMES DAILY. 08/20/22   Patwardhan, Manish J, MD  lipase/protease/amylase (CREON) 36000 UNITS CPEP capsule Take 1 capsule (36,000 Units total) by mouth 3 (three) times daily before meals. Patient not taking: Reported on 07/30/2022 06/15/22   Regalado, Jerald Kief A, MD  losartan (COZAAR) 100 MG tablet Take 1 tablet (100 mg total) by mouth daily. 06/08/22 09/06/22  Barb Merino, MD  ondansetron (ZOFRAN) 4 MG tablet Take 1 tablet (4 mg total) by mouth every 8 (eight) hours as needed for nausea or vomiting. 06/08/22   Barb Merino, MD  Pancrelipase, Lip-Prot-Amyl, (ZENPEP) 40000-126000 units CPEP Take by mouth. 2 tablets with each  meal and 1 tablet with snacks    [provider]  pantoprazole (PROTONIX) 40 MG tablet Take 1 tablet (40 mg total) by mouth 2 (two) times daily. 08/02/22 09/01/22  Antonieta Pert, MD  polyethylene glycol (MIRALAX / GLYCOLAX) 17 g packet Take 17 g by mouth daily. 06/15/22   Regalado, Belkys A, MD  promethazine (PHENERGAN) 12.5 MG tablet Take 1 tablet (12.5 mg total) by mouth every 8 (eight) hours as needed for nausea or vomiting. 07/05/22   Mansouraty, Telford Nab., MD  propranolol (INDERAL) 10 MG tablet Take 1 tablet (10 mg total) by mouth 2 (two) times daily. 06/08/22 09/06/22  Barb Merino, MD  vitamin B-12 1000 MCG tablet Take 1 tablet (1,000 mcg total) by mouth daily. Patient not taking: Reported on 07/30/2022 06/15/22   Elmarie Shiley,  MD    Family History Family History  Problem Relation Age of Onset   Breast cancer Mother 1   Heart disease Mother    Hypertension Mother    Colon cancer Neg Hx    Esophageal cancer Neg Hx    Inflammatory bowel disease Neg Hx    Liver disease Neg Hx    Pancreatic cancer Neg Hx    Rectal cancer Neg Hx    Stomach cancer Neg Hx     Social History Social History   Tobacco Use   Smoking status: Every Day    Packs/day: 0.12    Years: 5.00    Total pack years: 0.60    Types: Cigarettes   Smokeless tobacco: Never   Tobacco comments:    1 pack/week  Vaping Use   Vaping Use: Never used  Substance Use Topics   Alcohol use: Not Currently  Comment: occasionally   Drug use: No     Allergies   Ativan [lorazepam]   Review of Systems Review of Systems  Constitutional:  Negative for chills and fever.  HENT:  Positive for congestion and sore throat. Negative for ear pain.   Eyes:  Negative for discharge and redness.  Respiratory:  Positive for cough. Negative for shortness of breath and wheezing.   Gastrointestinal:  Positive for nausea. Negative for abdominal pain, diarrhea and vomiting.  Musculoskeletal:  Positive for myalgias.     Physical Exam Triage Vital Signs ED Triage Vitals  Enc Vitals Group     BP 08/30/22 1822 129/82     Pulse Rate 08/30/22 1822 (!) 102     Resp 08/30/22 1822 18     Temp 08/30/22 1822 98.6 F (37 C)     Temp src --      SpO2 08/30/22 1822 96 %     Weight --      Height --      Head Circumference --      Peak Flow --      Pain Score 08/30/22 1821 8     Pain Loc --      Pain Edu? --      Excl. in [place]? --    No data found.  Updated Vital Signs BP 129/82   Pulse (!) 102   Temp 98.6 F (37 C)   Resp 18   LMP  (Within Days)   SpO2 96%       Physical Exam Vitals and nursing note reviewed.  Constitutional:      General: She is not in acute distress.    Appearance: Normal appearance. She is not ill-appearing.  HENT:      Head: Normocephalic and atraumatic.     Nose: Congestion present.     Mouth/Throat:     Mouth: Mucous membranes are moist.     Pharynx: No oropharyngeal exudate or posterior oropharyngeal erythema.  Eyes:     Conjunctiva/sclera: Conjunctivae normal.  Cardiovascular:     Rate and Rhythm: Normal rate and regular rhythm.     Heart sounds: Normal heart sounds. No murmur heard. Pulmonary:     Effort: Pulmonary effort is normal. No respiratory distress.     Breath sounds: Normal breath sounds. No wheezing, rhonchi or rales.  Skin:    General: Skin is warm and dry.  Neurological:     Mental Status: She is alert.  Psychiatric:        Mood and Affect: Mood normal.        Thought Content: Thought content normal.      UC Treatments / Results  Labs (all labs ordered are listed, but only abnormal results are displayed) Labs Reviewed  SARS CORONAVIRUS 2 (TAT 6-24 HRS)  POCT INFLUENZA A/B    EKG   Radiology No results found.  Procedures Procedures (including critical care time)  Medications Ordered in UC Medications - No data to display  Initial Impression / Assessment and Plan / UC Course  I have reviewed the triage vital signs and the nursing notes.  Pertinent labs & imaging results that were available during my care of the patient were reviewed by me and considered in my medical decision making (see chart for details).    Suspect viral etiology of symptoms.  Point-of-care flu testing negative.  Will order screening for COVID.  Recommended symptomatic treatment, increase fluids and rest with follow-up with any further concerns.  Final Clinical  Impressions(s) / UC Diagnoses   Final diagnoses:  Acute upper respiratory infection  Encounter for screening for COVID-19   Discharge Instructions   None    ED Prescriptions   None    PDMP not reviewed this encounter.   Francene Finders, PA-C 08/30/22 1918

## 2022-09-01 LAB — SARS CORONAVIRUS 2 (TAT 6-24 HRS): SARS Coronavirus 2: NEGATIVE

## 2022-09-04 ENCOUNTER — Encounter (HOSPITAL_COMMUNITY): Payer: Self-pay | Admitting: Gastroenterology

## 2022-09-05 NOTE — Progress Notes (Signed)
Attempted to obtain medical history via telephone, unable to reach at this time. HIPAA compliant voicemail message left requesting return call to pre surgical testing department. 

## 2022-09-10 ENCOUNTER — Encounter (HOSPITAL_COMMUNITY): Payer: Self-pay | Admitting: Gastroenterology

## 2022-09-11 ENCOUNTER — Other Ambulatory Visit: Payer: Self-pay

## 2022-09-11 ENCOUNTER — Ambulatory Visit (HOSPITAL_COMMUNITY)
Admission: RE | Admit: 2022-09-11 | Discharge: 2022-09-11 | Disposition: A | Discharge status: Home / Self Care | Payer: 59 | Attending: Gastroenterology | Admitting: Gastroenterology

## 2022-09-11 ENCOUNTER — Encounter (HOSPITAL_COMMUNITY)
Admission: RE | Disposition: A | Discharge status: Home / Self Care | Payer: Self-pay | Source: Home / Self Care | Attending: Gastroenterology

## 2022-09-11 ENCOUNTER — Ambulatory Visit (HOSPITAL_COMMUNITY): Payer: 59 | Admitting: Anesthesiology

## 2022-09-11 ENCOUNTER — Ambulatory Visit (HOSPITAL_BASED_OUTPATIENT_CLINIC_OR_DEPARTMENT_OTHER): Payer: 59 | Admitting: Anesthesiology

## 2022-09-11 ENCOUNTER — Encounter (HOSPITAL_COMMUNITY): Payer: Self-pay | Admitting: Gastroenterology

## 2022-09-11 DIAGNOSIS — K2289 Other specified disease of esophagus: Secondary | ICD-10-CM | POA: Insufficient documentation

## 2022-09-11 DIAGNOSIS — K869 Disease of pancreas, unspecified: Secondary | ICD-10-CM

## 2022-09-11 DIAGNOSIS — K3189 Other diseases of stomach and duodenum: Secondary | ICD-10-CM | POA: Diagnosis not present

## 2022-09-11 DIAGNOSIS — K859 Acute pancreatitis without necrosis or infection, unspecified: Secondary | ICD-10-CM | POA: Diagnosis not present

## 2022-09-11 DIAGNOSIS — K920 Hematemesis: Secondary | ICD-10-CM | POA: Insufficient documentation

## 2022-09-11 DIAGNOSIS — I864 Gastric varices: Secondary | ICD-10-CM | POA: Insufficient documentation

## 2022-09-11 DIAGNOSIS — K861 Other chronic pancreatitis: Secondary | ICD-10-CM | POA: Diagnosis not present

## 2022-09-11 DIAGNOSIS — K862 Cyst of pancreas: Secondary | ICD-10-CM | POA: Diagnosis not present

## 2022-09-11 DIAGNOSIS — I714 Abdominal aortic aneurysm, without rupture, unspecified: Secondary | ICD-10-CM | POA: Insufficient documentation

## 2022-09-11 DIAGNOSIS — K838 Other specified diseases of biliary tract: Secondary | ICD-10-CM | POA: Diagnosis not present

## 2022-09-11 DIAGNOSIS — I10 Essential (primary) hypertension: Secondary | ICD-10-CM

## 2022-09-11 DIAGNOSIS — R200 Anesthesia of skin: Secondary | ICD-10-CM

## 2022-09-11 DIAGNOSIS — F17210 Nicotine dependence, cigarettes, uncomplicated: Secondary | ICD-10-CM | POA: Diagnosis not present

## 2022-09-11 DIAGNOSIS — R590 Localized enlarged lymph nodes: Secondary | ICD-10-CM | POA: Diagnosis not present

## 2022-09-11 HISTORY — PX: NEUROLYTIC CELIAC PLEXUS: SHX5435

## 2022-09-11 HISTORY — PX: BIOPSY: SHX5522

## 2022-09-11 HISTORY — PX: ESOPHAGOGASTRODUODENOSCOPY (EGD) WITH PROPOFOL: SHX5813

## 2022-09-11 HISTORY — PX: EUS: SHX5427

## 2022-09-11 LAB — PREGNANCY, URINE: Preg Test, Ur: NEGATIVE

## 2022-09-11 SURGERY — UPPER ENDOSCOPIC ULTRASOUND (EUS) LINEAR
Anesthesia: Monitor Anesthesia Care

## 2022-09-11 SURGERY — ESOPHAGOGASTRODUODENOSCOPY (EGD) WITH PROPOFOL
Anesthesia: Monitor Anesthesia Care

## 2022-09-11 MED ORDER — PROPOFOL 500 MG/50ML IV EMUL
INTRAVENOUS | Status: AC
  Filled 2022-09-11: qty 50

## 2022-09-11 MED ORDER — PHENYLEPHRINE HCL (PRESSORS) 10 MG/ML IV SOLN
INTRAVENOUS | Status: DC | PRN
  Administered 2022-09-11: 80 ug via INTRAVENOUS

## 2022-09-11 MED ORDER — MIDAZOLAM HCL 2 MG/2ML IJ SOLN
INTRAMUSCULAR | Status: AC
  Filled 2022-09-11: qty 2

## 2022-09-11 MED ORDER — OXYCODONE HCL 5 MG PO TABS: 5.0000 mg | ORAL_TABLET | Freq: Three times a day (TID) | ORAL | 0 refills | Status: DC | PRN

## 2022-09-11 MED ORDER — TRIAMCINOLONE ACETONIDE 40 MG/ML IJ SUSP
INTRAMUSCULAR | Status: DC | PRN
  Administered 2022-09-11: 17 mL via PERINEURAL

## 2022-09-11 MED ORDER — ONDANSETRON HCL 4 MG/2ML IJ SOLN
INTRAMUSCULAR | Status: DC | PRN
  Administered 2022-09-11: 4 mg via INTRAVENOUS

## 2022-09-11 MED ORDER — LACTATED RINGERS IV SOLN: INTRAVENOUS | Status: DC

## 2022-09-11 MED ORDER — SODIUM CHLORIDE 0.9 % IV SOLN: INTRAVENOUS | Status: DC

## 2022-09-11 MED ORDER — BUPIVACAINE HCL (PF) 0.25 % IJ SOLN
20.0000 mL | Freq: Once | INTRAMUSCULAR | Status: DC
  Filled 2022-09-11: qty 20

## 2022-09-11 MED ORDER — TRIAMCINOLONE ACETONIDE 40 MG/ML IJ SUSP: 20.0000 mL | Freq: Once | INTRAMUSCULAR | Status: DC

## 2022-09-11 MED ORDER — PROPOFOL 1000 MG/100ML IV EMUL
INTRAVENOUS | Status: AC
  Filled 2022-09-11: qty 100

## 2022-09-11 MED ORDER — LIDOCAINE HCL (CARDIAC) PF 100 MG/5ML IV SOSY
PREFILLED_SYRINGE | INTRAVENOUS | Status: DC | PRN
  Administered 2022-09-11: 100 mg via INTRAVENOUS

## 2022-09-11 MED ORDER — MIDAZOLAM HCL 5 MG/5ML IJ SOLN
INTRAMUSCULAR | Status: DC | PRN
  Administered 2022-09-11: 2 mg via INTRAVENOUS

## 2022-09-11 MED ORDER — CIPROFLOXACIN IN D5W 400 MG/200ML IV SOLN
INTRAVENOUS | Status: DC | PRN
  Administered 2022-09-11: 400 mg via INTRAVENOUS

## 2022-09-11 MED ORDER — TRIAMCINOLONE ACETONIDE 40 MG/ML IJ SUSP
INTRAMUSCULAR | Status: AC
  Filled 2022-09-11: qty 2

## 2022-09-11 MED ORDER — PROPOFOL 10 MG/ML IV BOLUS
INTRAVENOUS | Status: DC | PRN
  Administered 2022-09-11: 60 mg via INTRAVENOUS

## 2022-09-11 MED ORDER — TRIAMCINOLONE ACETONIDE 40 MG/ML IJ SUSP: 80.0000 mg | Freq: Once | INTRAMUSCULAR | Status: DC

## 2022-09-11 MED ORDER — CIPROFLOXACIN IN D5W 400 MG/200ML IV SOLN
INTRAVENOUS | Status: AC
  Filled 2022-09-11: qty 200

## 2022-09-11 MED ORDER — PROPOFOL 500 MG/50ML IV EMUL
INTRAVENOUS | Status: DC | PRN
  Administered 2022-09-11: 150 ug/kg/min via INTRAVENOUS

## 2022-09-11 SURGICAL SUPPLY — 15 items
BLOCK BITE 60FR ADLT L/F BLUE (MISCELLANEOUS) ×2 IMPLANT
ELECT REM PT RETURN 9FT ADLT (ELECTROSURGICAL)
ELECTRODE REM PT RTRN 9FT ADLT (ELECTROSURGICAL) IMPLANT
FORCEP RJ3 GP 1.8X160 W-NEEDLE (CUTTING FORCEPS) IMPLANT
FORCEPS BIOP RAD 4 LRG CAP 4 (CUTTING FORCEPS) IMPLANT
NDL SCLEROTHERAPY 25GX240 (NEEDLE) IMPLANT
NEEDLE SCLEROTHERAPY 25GX240 (NEEDLE) IMPLANT
PROBE APC STR FIRE (PROBE) IMPLANT
PROBE INJECTION GOLD (MISCELLANEOUS)
PROBE INJECTION GOLD 7FR (MISCELLANEOUS) IMPLANT
SNARE SHORT THROW 13M SML OVAL (MISCELLANEOUS) IMPLANT
SYR 50ML LL SCALE MARK (SYRINGE) IMPLANT
TUBING ENDO SMARTCAP PENTAX (MISCELLANEOUS) ×4 IMPLANT
TUBING IRRIGATION ENDOGATOR (MISCELLANEOUS) ×3 IMPLANT
WATER STERILE IRR 1000ML POUR (IV SOLUTION) IMPLANT

## 2022-09-11 NOTE — H&P (Signed)
GASTROENTEROLOGY PROCEDURE H&P NOTE   Primary Care Physician: Trey Sailors, PA  HPI: Robin Guerrero is a 36 y.o. female who presents for EGD/EUS for attempt at celiac axis blockade in setting of recurrent and chronic abdominal pain from chronic pancreatitis.  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   Hypertension    states under control with med., has been on med. x 3 yr.   Pancreatitis    Past Surgical History:  Procedure Laterality Date   ESOPHAGOGASTRODUODENOSCOPY N/A 06/06/2022   Procedure: ESOPHAGOGASTRODUODENOSCOPY (EGD);  Surgeon: Arta Silence, MD;  Location: [facility];  Service: Gastroenterology;  Laterality: N/A;   ESOPHAGOGASTRODUODENOSCOPY N/A 06/14/2022   Procedure: ESOPHAGOGASTRODUODENOSCOPY (EGD);  Surgeon: [name], MD;  Location: [facility];  Service: Gastroenterology;  Laterality: N/A;   ESOPHAGOGASTRODUODENOSCOPY (EGD) WITH PROPOFOL N/A 08/01/2022   Procedure: ESOPHAGOGASTRODUODENOSCOPY (EGD) WITH PROPOFOL;  Surgeon: Clarene Essex, MD;  Location: [facility];  Service: Gastroenterology;  Laterality: N/A;   FOREIGN BODY REMOVAL Left 02/10/2015   Procedure: ATTEMPTED EXPLANTATION OF BIRTH CONTROL DEVICE LEFT ARM;  Surgeon: Johnathan Hausen, MD;  Location: [place];  Service: General;  Laterality: Left;   FOREIGN BODY REMOVAL Left 05/12/2015   Procedure: REMOVAL OF LEFT ARM BIRTH CONTROL DEVICE;  Surgeon: Johnathan Hausen, MD;  Location: [place];  Service: General;  Laterality: Left;   SUBMANDIBULAR GLAND EXCISION Right 05/01/2009   Current Facility-Administered Medications  Medication Dose Route Frequency Provider Last Rate Last Admin   bupivacaine 0.25%, 30 mL vial/triamcinolone 40 mg/mL, 2 x 1 mL for celiac plexus block inj mixture  20 mL Celiac Plexus Block Once Mansouraty, Telford Nab., MD       lactated ringers infusion   Intravenous  Continuous Mansouraty, Telford Nab., MD 20 mL/hr at 09/11/22 0917 New Bag at 09/11/22 0917    Current Facility-Administered Medications:    bupivacaine 0.25%, 30 mL vial/triamcinolone 40 mg/mL, 2 x 1 mL for celiac plexus block inj mixture, 20 mL, Celiac Plexus Block, Once, Mansouraty, Telford Nab., MD   lactated ringers infusion, , Intravenous, Continuous, Mansouraty, Telford Nab., MD, Last Rate: 20 mL/hr at 09/11/22 0917, New Bag at 09/11/22 0917 Allergies  Allergen Reactions   Ativan [Lorazepam] Other (See Comments)    Disorientated, combative   Family History  Problem Relation Age of Onset   Breast cancer Mother 33   Heart disease Mother    Hypertension Mother    Colon cancer Neg Hx    Esophageal cancer Neg Hx    Inflammatory bowel disease Neg Hx    Liver disease Neg Hx    Pancreatic cancer Neg Hx    Rectal cancer Neg Hx    Stomach cancer Neg Hx    Social History   Socioeconomic History   Marital status: Single    Spouse name: Not on file   Number of children: 2   Years of education: Not on file   Highest education level: Not on file  Occupational History   Not on file  Tobacco Use   Smoking status: Every Day    Packs/day: 0.12    Years: 5.00    Total pack years: 0.60    Types: Cigarettes   Smokeless tobacco: Never   Tobacco comments:    1 pack/week  Vaping Use   Vaping Use: Never used  Substance and Sexual Activity   Alcohol use: Not Currently  Comment: occasionally   Drug use: No   Sexual activity: Not Currently  Other Topics Concern   Not on file  Social History Narrative   Not on file   Social Determinants of Health   Financial Resource Strain: Not on file  Food Insecurity: Not on file  Transportation Needs: Not on file  Physical Activity: Not on file  Stress: Not on file  Social Connections: Not on file  Intimate Partner Violence: Not on file    Physical Exam: Today's Vitals   09/06/22 1318 09/11/22 0908  BP:  122/86  Pulse:  97  Resp:   (!) 21  TempSrc:  Temporal  SpO2:  100%  Weight: 84.4 kg 78.5 kg  Height:  '5\' 8"'$  (1.727 m)  PainSc:  4    Body mass index is 26.3 kg/m. GEN: NAD EYE: Sclerae anicteric ENT: MMM CV: Non-tachycardic GI: Soft, TTP in MEG 4 out of 10 pain currently NEURO:  Alert & Oriented x 3  Lab Results: No results for input(s): "WBC", "HGB", "HCT", "PLT" in the last 72 hours. BMET No results for input(s): "NA", "K", "CL", "CO2", "GLUCOSE", "BUN", "CREATININE", "CALCIUM" in the last 72 hours. LFT No results for input(s): "PROT", "ALBUMIN", "AST", "ALT", "ALKPHOS", "BILITOT", "BILIDIR", "IBILI" in the last 72 hours. PT/INR No results for input(s): "LABPROT", "INR" in the last 72 hours.   Impression / Plan: This is a 36 y.o.female who presents for EGD/EUS for attempt at celiac axis blockade in setting of recurrent and chronic abdominal pain from chronic pancreatitis.  The risks of an EUS including intestinal perforation, bleeding, infection, aspiration, and medication effects were discussed as was the possibility it may not give a definitive diagnosis if a biopsy is performed.  When a biopsy of the pancreas is done as part of the EUS, there is an additional risk of pancreatitis at the rate of about 1-2%.  It was explained that procedure related pancreatitis is typically mild, although it can be severe and even life threatening, which is why we do not perform random pancreatic biopsies and only biopsy a lesion/area we feel is concerning enough to warrant the risk.  The amount of varices that she has may complicate our ability to access the celiac safely and she also has a known abdominal aortic aneurysm   The risks and benefits of endoscopic evaluation/treatment were discussed with the patient and/or family; these include but are not limited to the risk of perforation, infection, bleeding, missed lesions, lack of diagnosis, severe illness requiring hospitalization, as well as anesthesia and sedation  related illnesses.  The patient's history has been reviewed, patient examined, no change in status, and deemed stable for procedure.  The patient and/or family is agreeable to proceed.    Justice Britain, MD [facility] Office # [phone]

## 2022-09-11 NOTE — Discharge Instructions (Signed)
YOU HAD AN ENDOSCOPIC PROCEDURE TODAY: Refer to the procedure report and other information in the discharge instructions given to you for any specific questions about what was found during the examination. If this information does not answer your questions, please call [place] office at [phone] to clarify.   YOU SHOULD EXPECT: Some feelings of bloating in the abdomen. Passage of more gas than usual. Walking can help get rid of the air that was put into your GI tract during the procedure and reduce the bloating. If you had a lower endoscopy (such as a colonoscopy or flexible sigmoidoscopy) you may notice spotting of blood in your stool or on the toilet paper. Some abdominal soreness may be present for a day or two, also.  DIET: Your first meal following the procedure should be a light meal and then it is ok to progress to your normal diet. A half-sandwich or bowl of soup is an example of a good first meal. Heavy or fried foods are harder to digest and may make you feel nauseous or bloated. Drink plenty of fluids but you should avoid alcoholic beverages for 24 hours. If you had a esophageal dilation, please see attached instructions for diet.    ACTIVITY: Your care partner should take you home directly after the procedure. You should plan to take it easy, moving slowly for the rest of the day. You can resume normal activity the day after the procedure however YOU SHOULD NOT DRIVE, use power tools, machinery or perform tasks that involve climbing or major physical exertion for 24 hours (because of the sedation medicines used during the test).   SYMPTOMS TO REPORT IMMEDIATELY: A gastroenterologist can be reached at any hour. Please call [phone]  for any of the following symptoms:   Following upper endoscopy (EGD, EUS, ERCP, esophageal dilation) Vomiting of blood or coffee ground material  New, significant abdominal pain  New, significant chest pain or pain under the shoulder blades  Painful or  persistently difficult swallowing  New shortness of breath  Black, tarry-looking or red, bloody stools  FOLLOW UP:  If any biopsies were taken you will be contacted by phone or by letter within the next 1-3 weeks. Call [phone]  if you have not heard about the biopsies in 3 weeks.  Please also call with any specific questions about appointments or follow up tests.  

## 2022-09-11 NOTE — Anesthesia Postprocedure Evaluation (Signed)
Anesthesia Post Note  Patient: Stefannie Mood  Procedure(s) Performed: ESOPHAGOGASTRODUODENOSCOPY (EGD) WITH PROPOFOL UPPER ENDOSCOPIC ULTRASOUND (EUS) RADIAL NEUROLYTIC CELIAC PLEXUS BIOPSY     Patient location during evaluation: Endoscopy Anesthesia Type: MAC Level of consciousness: awake Pain management: pain level controlled Vital Signs Assessment: post-procedure vital signs reviewed and stable Respiratory status: spontaneous breathing Cardiovascular status: blood pressure returned to baseline Postop Assessment: no apparent nausea or vomiting Anesthetic complications: no   No notable events documented.  Last Vitals:  Vitals:   09/11/22 1150 09/11/22 1200  BP: 118/85 120/84  Pulse: 82 82  Resp: (!) 22 (!) 21  Temp:    SpO2: 95% 97%    Last Pain:  Vitals:   09/11/22 1200  TempSrc:   PainSc: 0-No pain                 Zach Tietje

## 2022-09-11 NOTE — Anesthesia Preprocedure Evaluation (Signed)
Anesthesia Evaluation  Patient identified by MRN, date of birth, ID band Patient awake    Reviewed: Allergy & Precautions, NPO status , Patient's Chart, lab work & pertinent test results  Airway Mallampati: II       Dental   Pulmonary Current Smoker and Patient abstained from smoking.,    breath sounds clear to auscultation       Cardiovascular hypertension,  Rhythm:Regular Rate:Normal     Neuro/Psych PSYCHIATRIC DISORDERS    GI/Hepatic Neg liver ROS, GERD  ,  Endo/Other    Renal/GU Renal disease     Musculoskeletal   Abdominal   Peds  Hematology   Anesthesia Other Findings   Reproductive/Obstetrics                             Anesthesia Physical Anesthesia Plan  ASA: 3  Anesthesia Plan: MAC   Post-op Pain Management:    Induction: Intravenous  PONV Risk Score and Plan: 2 and Ondansetron and Dexamethasone  Airway Management Planned: Simple Face Mask and Nasal Cannula  Additional Equipment:   Intra-op Plan:   Post-operative Plan:   Informed Consent: I have reviewed the patients History and Physical, chart, labs and discussed the procedure including the risks, benefits and alternatives for the proposed anesthesia with the patient or authorized representative who has indicated his/her understanding and acceptance.     Dental advisory given  Plan Discussed with: CRNA and Anesthesiologist  Anesthesia Plan Comments:         Anesthesia Quick Evaluation

## 2022-09-11 NOTE — Op Note (Signed)
[facility] Patient Name: Robin Guerrero Procedure Date: 09/11/2022 MRN: [id number] Attending MD: Justice Britain , MD Date of Birth: [birth year] CSN: [id number] Age: 36 Admit Type: Outpatient Procedure:                Upper EUS Indications:              Pancreatic cyst on CT scan, Exclusion of chronic                            pancreatitis, Acute recurrent pancreatitis, Celiac                            plexus block for pain secondary to chronic                            pancreatitis, Hematemesis Providers:                Justice Britain, MD, Jeanella Cara, RN,                            [facility] Technician, Technician Referring MD:             Ronnette Juniper, MD, Clarene Essex, MD, Trey Sailors Medicines:                Monitored Anesthesia Care, Cipro 456 mg IV Complications:            No immediate complications. Estimated Blood Loss:     Estimated blood loss was minimal. Procedure:                Pre-Anesthesia Assessment:                           - Prior to the procedure, a History and Physical                            was performed, and patient medications and                            allergies were reviewed. The patient's tolerance of                            previous anesthesia was also reviewed. The risks                            and benefits of the procedure and the sedation                            options and risks were discussed with the patient.                            All questions were answered, and informed consent                            was obtained. Prior Anticoagulants: The patient has  taken no previous anticoagulant or antiplatelet                            agents. ASA Grade Assessment: III - A patient with                            severe systemic disease. After reviewing the risks                            and benefits, the patient was deemed in                             satisfactory condition to undergo the procedure.                           After obtaining informed consent, the endoscope was                            passed under direct vision. Throughout the                            procedure, the patient's blood pressure, pulse, and                            oxygen saturations were monitored continuously. The                            [id number]-H190 ([id number]) Olympus endoscope was introduced                            through the mouth, and advanced to the second part                            of duodenum. The [id number]-Q190V ([id number]) Olympus                            duodenoscope was introduced through the mouth, and                            advanced to the area of papilla. The GF-UE190-AL5                            ([id number]) Olympus radial ultrasound scope was                            introduced through the mouth, and advanced to the                            stomach for ultrasound examination. The [id number]                            ([id number]) Olympus linear ultrasound scope was  introduced through the mouth, and advanced to the                            duodenum for ultrasound examination from the                            stomach and duodenum. The upper EUS was                            accomplished without difficulty. The patient                            tolerated the procedure. Scope In: Scope Out: Findings:      ENDOSCOPIC FINDING: :      No gross lesions were noted in the entire esophagus.      The Z-line was irregular and was found 39 cm from the incisors.      Type 1 isolated gastric varices (IGV1, varices located in the fundus)       with no bleeding were found in the gastric fundus. There were no       stigmata of recent bleeding. They were medium in largest diameter.      Patchy mildly erythematous mucosa without bleeding was found in the       entire examined stomach. Biopsies were taken with a cold  forceps for       histology and Helicobacter pylori testing.      No gross lesions were noted in the duodenal bulb, in the first portion       of the duodenum and in the second portion of the duodenum.      The major papilla was normal and hidden under a hood.      ENDOSONOGRAPHIC FINDING: :      Pancreatic parenchymal abnormalities were noted in the entire pancreas.       These consisted of atrophy, diffuse echogenicity, lobularity without       honeycombing and hyperechoic strands.      The pancreatic duct in the pancreatic head measured 1.5 mm, genu of the       pancreas measured 1.5 mm to 2.3 mm, body of the pancreas measured 2.4 to       1.6 mm and tail of the pancreas measured 1.7 mm. There was some dilated       side-branching in the pancreatic body and tail region.      Anechoic lesions suggestive of a few cysts were identified in the genu       of the pancreas and pancreatic body. The first cyst in the genu measured       13 mm by 14 mm. The second cyst in the body measured 6 mm by 3 mm. The       third cyst in the body measured 5 mm by 4 mm. There was no associated       mass.      There was no sign of significant endosonographic abnormality in the       common bile duct (2.3 mm to 3.9 mm) and in the common hepatic duct (7       mm). Ducts of normal caliber were identified otherwise.      A small amount of hyperechoic material consistent with sludge was  visualized endosonographically in the gallbladder.      Endosonographic imaging of the ampulla showed no intramural       (subepithelial) lesion.      Endosonographic imaging in the visualized portion of the liver showed no       mass.      A few enlarged lymph nodes were visualized in the perigastric region and       peripancreatic region. The nodes were triangular, isoechoic and had well       defined margins.      Endoscopic ultrasound examination revealed multiple tubal, anechoic       structures in the cardia of the  stomach and in the fundus of the stomach       consistent with varices.      The celiac region was visualized. After the completion of the EUS, and       after close evaluation to try to minimize risk of gastric varix being       manipulated or touched, a celiac plexus block was performed. The region       of the celiac plexus and celiac ganglia was identified       endosonographically with Color Doppler imaging, using the take-off of       the celiac trunk from the anterior aspect of the aorta as the main       anatomical landmark. Color Doppler guidance was also used to confirm a       lack of significant vascular structures within the injection needle       path. Using a transgastric approach, a 20 gauge celiac plexus needle was       advanced to an injection site in the area of the celiac artery take-off.       Needle aspiration was performed prior to injection to exclude entry into       a blood vessel. A total of 20 mL of 0.25% bupivacaine and 2 mg of       triamcinolone (40 mg/mL) were injected for the celiac plexus block. The       needle was then withdrawn. Impression:               EGD impression:                           - No gross lesions in esophagus. Z-line irregular,                            39 cm from the incisors.                           - Type 1 isolated gastric varices (IGV1, varices                            located in the fundus), without bleeding.                           - Erythematous mucosa in the stomach. Biopsied.                           - No gross lesions in the duodenal bulb, in the  first portion of the duodenum and in the second                            portion of the duodenum.                           - Normal major papilla hidden under a hood.                           EUS impression:                           - Pancreatic parenchymal abnormalities consisting                            of atrophy, diffuse echogenicity,  lobularity and                            hyperechoic strands were noted in the entire                            pancreas.                           - The pancreatic duct was normal in the head,                            slightly prominent in the neck and body area and                            normal in the tail. Sidebranch dilation in the                            body/tail area was noted.                           - A few cystic lesions were seen in the genu of the                            pancreas and pancreatic body. Tissue has not been                            obtained. 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                           - There was no sign of significant pathology in the                            common bile duct and in the common hepatic duct.                           -  Small amount of hyperechoic material consistent                            with sludge was visualized endosonographically in                            the gallbladder.                           - A few enlarged lymph nodes were visualized in the                            perigastric region and peripancreatic region.                            Tissue has not been obtained. However, the                            endosonographic appearance is suggestive of benign                            inflammatory changes.                           - Varices were visualized endosonographically in                            the cardia of the stomach and in the fundus of the                            stomach.                           - Celiac plexus block performed. Moderate Sedation:      Not Applicable - Patient had care per Anesthesia. Recommendation:           - The patient will be observed post-procedure,                            until all discharge criteria are met.                           - Discharge patient to home.                            - Patient has a contact number available for                            emergencies. The signs and symptoms of potential                            delayed complications were discussed with the                            patient. Return to normal activities tomorrow.  Written discharge instructions were provided to the                            patient.                           - Low fat diet.                           - Observe patient's clinical course.                           - Follow-up with [facility] gastroenterology and [facility]                            hepatology/gastroenterology.                           - We will see in the next week or so whether the                            patient has improvement with the celiac axis block.                            If she does not it is long-lasting, consider repeat                            treatment in a few months if necessary.                           - There are discussions ongoing as to whether                            patient could need a splenectomy or further                            evaluation and management at a quatern[facility] center so                            advanced endoscopy at [facility] may also become involved.                           - Continue present medications.                           - Await path results.                           - The findings and recommendations were discussed                            with the patient.                           - The findings and recommendations were discussed  with the patient's family. Procedure Code(s):        --- Professional ---                           [phone], Esophagogastroduodenoscopy, flexible,                            transoral; with transendoscopic ultrasound-guided                            transmural injection of diagnostic or therapeutic                            substance(s) (eg,  anesthetic, neurolytic agent) or                            fiducial marker(s) (includes endoscopic ultrasound                            examination of the esophagus, stomach, and either                            the duodenum or a surgically altered stomach where                            the jejunum is examined distal to the anastomosis) Diagnosis Code(s):        --- Professional ---                           K22.8, Other specified diseases of esophagus                           I86.4, Gastric varices                           K31.89, Other diseases of stomach and duodenum                           K86.9, Disease of pancreas, unspecified                           K86.2, Cyst of pancreas                           R59.0, Localized enlarged lymph nodes                           K85.90, Acute pancreatitis without necrosis or                            infection, unspecified                           K86.1, Other chronic pancreatitis                           K92.0, Hematemesis  R93.3, Abnormal findings on diagnostic imaging of                            other parts of digestive tract                           K83.8, Other specified diseases of biliary tract CPT copyright 2019 [employer]. All rights reserved. The codes documented in this report are preliminary and upon coder review may  be revised to meet current compliance requirements. Justice Britain, MD 09/11/2022 11:49:19 AM Number of Addenda: 0

## 2022-09-11 NOTE — Transfer of Care (Signed)
Immediate Anesthesia Transfer of Care Note  Patient: Robin Guerrero  Procedure(s) Performed: Procedure(s): ESOPHAGOGASTRODUODENOSCOPY (EGD) WITH PROPOFOL (N/A) UPPER ENDOSCOPIC ULTRASOUND (EUS) RADIAL (N/A) NEUROLYTIC CELIAC PLEXUS (N/A) BIOPSY  Patient Location: PACU  Anesthesia Type:MAC  Level of Consciousness:  sedated, patient cooperative and responds to stimulation  Airway & Oxygen Therapy:Patient Spontanous Breathing and Patient connected to face mask oxgen  Post-op Assessment:  Report given to PACU RN and Post -op Vital signs reviewed and stable  Post vital signs:  Reviewed and stable  Last Vitals:  Vitals:   09/11/22 0908 09/11/22 1117  BP: 122/86 116/74  Pulse: 97 96  Resp: (!) 21 15  Temp:  36.6 C  SpO2: 053% 976%    Complications: No apparent anesthesia complications

## 2022-09-16 ENCOUNTER — Other Ambulatory Visit: Payer: Self-pay

## 2022-09-16 ENCOUNTER — Encounter: Payer: Self-pay | Admitting: Gastroenterology

## 2022-09-16 LAB — SURGICAL PATHOLOGY

## 2022-09-16 MED ORDER — BISMUTH/METRONIDAZ/TETRACYCLIN 140-125-125 MG PO CAPS: 3.0000 | ORAL_CAPSULE | Freq: Four times a day (QID) | ORAL | 0 refills | Status: DC

## 2022-09-23 ENCOUNTER — Telehealth: Payer: Self-pay | Admitting: Gastroenterology

## 2022-09-23 NOTE — Telephone Encounter (Signed)
Left message

## 2022-09-23 NOTE — Telephone Encounter (Signed)
Patient called states her pharmacy told her to give [place] a call back. States they don't carry the Bismuth and to see if another kind can be called in instead,

## 2022-09-24 NOTE — Telephone Encounter (Signed)
Robin Guerrero, Okay to send the patient in individual ingredients of Pylera x14 days. Thanks. GM

## 2022-09-24 NOTE — Telephone Encounter (Signed)
Recd fax from pharmacy stating that Pylera is not available. Pharmacy is requesting alternate medication be sent.   Please advise.

## 2022-09-25 NOTE — Telephone Encounter (Signed)
Inbound call from patient returning call. Please give a call back to further advise.  Thank you 

## 2022-09-26 MED ORDER — METRONIDAZOLE 250 MG PO TABS: 250.0000 mg | ORAL_TABLET | Freq: Four times a day (QID) | ORAL | 0 refills | Status: DC

## 2022-09-26 MED ORDER — DOXYCYCLINE HYCLATE 100 MG PO CAPS: 100.0000 mg | ORAL_CAPSULE | Freq: Two times a day (BID) | ORAL | 0 refills | Status: DC

## 2022-09-26 MED ORDER — OMEPRAZOLE 20 MG PO CPDR: 20.0000 mg | DELAYED_RELEASE_CAPSULE | Freq: Two times a day (BID) | ORAL | 0 refills | Status: DC

## 2022-09-26 MED ORDER — BISMUTH SUBSALICYLATE 262 MG PO CHEW: 524.0000 mg | CHEWABLE_TABLET | Freq: Four times a day (QID) | ORAL | 0 refills | Status: AC

## 2022-09-26 NOTE — Addendum Note (Signed)
Addended byDebbe Mounts on: 09/26/2022 04:28 PM   Modules accepted: Orders

## 2022-09-26 NOTE — Addendum Note (Signed)
Addended byDebbe Mounts on: 09/26/2022 02:04 PM   Modules accepted: Orders

## 2022-09-26 NOTE — Telephone Encounter (Signed)
Returned call to patient. Pt has been informed that alternate medication for H. Pylori treatment has been sent to pharmacy. Pt understands that she will need to hold Pantoprazole while taking treatment.Once treatment has been completed patient may resume Pantoprazole.

## 2022-10-04 ENCOUNTER — Other Ambulatory Visit: Payer: Self-pay | Admitting: Physician Assistant

## 2022-10-04 DIAGNOSIS — K861 Other chronic pancreatitis: Secondary | ICD-10-CM

## 2022-10-04 DIAGNOSIS — K766 Portal hypertension: Secondary | ICD-10-CM

## 2022-10-28 ENCOUNTER — Emergency Department (HOSPITAL_COMMUNITY): Payer: 59

## 2022-10-28 ENCOUNTER — Emergency Department (HOSPITAL_COMMUNITY)
Admission: EM | Admit: 2022-10-28 | Discharge: 2022-10-28 | Disposition: A | Discharge status: Home / Self Care | Payer: 59 | Source: Home / Self Care | Attending: Emergency Medicine | Admitting: Emergency Medicine

## 2022-10-28 ENCOUNTER — Encounter (HOSPITAL_COMMUNITY): Payer: Self-pay

## 2022-10-28 DIAGNOSIS — Z79899 Other long term (current) drug therapy: Secondary | ICD-10-CM | POA: Insufficient documentation

## 2022-10-28 DIAGNOSIS — K861 Other chronic pancreatitis: Secondary | ICD-10-CM | POA: Insufficient documentation

## 2022-10-28 DIAGNOSIS — R1084 Generalized abdominal pain: Secondary | ICD-10-CM

## 2022-10-28 DIAGNOSIS — R000 Tachycardia, unspecified: Secondary | ICD-10-CM | POA: Diagnosis not present

## 2022-10-28 DIAGNOSIS — K859 Acute pancreatitis without necrosis or infection, unspecified: Secondary | ICD-10-CM | POA: Diagnosis not present

## 2022-10-28 DIAGNOSIS — R112 Nausea with vomiting, unspecified: Secondary | ICD-10-CM

## 2022-10-28 DIAGNOSIS — I10 Essential (primary) hypertension: Secondary | ICD-10-CM | POA: Insufficient documentation

## 2022-10-28 LAB — COMPREHENSIVE METABOLIC PANEL
ALT: 14 U/L (ref 0–44)
AST: 13 U/L — ABNORMAL LOW (ref 15–41)
Albumin: 3.9 g/dL (ref 3.5–5.0)
Alkaline Phosphatase: 50 U/L (ref 38–126)
Anion gap: 9 (ref 5–15)
BUN: 12 mg/dL (ref 6–20)
CO2: 22 mmol/L (ref 22–32)
Calcium: 9.1 mg/dL (ref 8.9–10.3)
Chloride: 108 mmol/L (ref 98–111)
Creatinine, Ser: 0.73 mg/dL (ref 0.44–1.00)
GFR, Estimated: >60 mL/min (ref >60)
Glucose, Bld: 89 mg/dL (ref 70–99)
Potassium: 3.5 mmol/L (ref 3.5–5.1)
Sodium: 139 mmol/L (ref 135–145)
Total Bilirubin: 1.2 mg/dL (ref 0.3–1.2)
Total Protein: 7.5 g/dL (ref 6.5–8.1)

## 2022-10-28 LAB — I-STAT BETA HCG BLOOD, ED (MC, WL, AP ONLY): I-stat hCG, quantitative: <5 m[IU]/mL (ref <5)

## 2022-10-28 LAB — CBC WITH DIFFERENTIAL/PLATELET
Abs Immature Granulocytes: 0.02 10*3/uL (ref 0.00–0.07)
Basophils Absolute: 0 10*3/uL (ref 0.0–0.1)
Basophils Relative: 0 %
Eosinophils Absolute: 0 10*3/uL (ref 0.0–0.5)
Eosinophils Relative: 0 %
HCT: 39.4 % (ref 36.0–46.0)
Hemoglobin: 12.5 g/dL (ref 12.0–15.0)
Immature Granulocytes: 0 %
Lymphocytes Relative: 18 %
Lymphs Abs: 1.3 10*3/uL (ref 0.7–4.0)
MCH: 30.3 pg (ref 26.0–34.0)
MCHC: 31.7 g/dL (ref 30.0–36.0)
MCV: 95.4 fL (ref 80.0–100.0)
Monocytes Absolute: 0.3 10*3/uL (ref 0.1–1.0)
Monocytes Relative: 4 %
Neutro Abs: 5.5 10*3/uL (ref 1.7–7.7)
Neutrophils Relative %: 78 %
Platelets: 279 10*3/uL (ref 150–400)
RBC: 4.13 MIL/uL (ref 3.87–5.11)
RDW: 14.4 % (ref 11.5–15.5)
WBC: 7.2 10*3/uL (ref 4.0–10.5)
nRBC: 0 % (ref 0.0–0.2)

## 2022-10-28 LAB — LIPASE, BLOOD: Lipase: 98 U/L — ABNORMAL HIGH (ref 11–51)

## 2022-10-28 LAB — ETHANOL: Alcohol, Ethyl (B): <10 mg/dL (ref <10)

## 2022-10-28 MED ORDER — METOCLOPRAMIDE HCL 10 MG PO TABS: 10.0000 mg | ORAL_TABLET | Freq: Four times a day (QID) | ORAL | 0 refills | Status: DC

## 2022-10-28 MED ORDER — DROPERIDOL 2.5 MG/ML IJ SOLN
1.2500 mg | Freq: Once | INTRAMUSCULAR | Status: AC
  Administered 2022-10-28: 1.25 mg via INTRAVENOUS
  Filled 2022-10-28: qty 2

## 2022-10-28 MED ORDER — IOHEXOL 350 MG/ML SOLN
100.0000 mL | Freq: Once | INTRAVENOUS | Status: AC | PRN
  Administered 2022-10-28: 100 mL via INTRAVENOUS

## 2022-10-28 MED ORDER — LACTATED RINGERS IV BOLUS
1000.0000 mL | Freq: Once | INTRAVENOUS | Status: AC
  Administered 2022-10-28: 1000 mL via INTRAVENOUS

## 2022-10-28 MED ORDER — MORPHINE SULFATE (PF) 4 MG/ML IV SOLN
4.0000 mg | Freq: Once | INTRAVENOUS | Status: AC
  Administered 2022-10-28: 4 mg via INTRAVENOUS
  Filled 2022-10-28: qty 1

## 2022-10-28 NOTE — Discharge Instructions (Signed)
Today you were seen in the emergency department for your nausea and vomiting.    In the emergency department you had a CT scan that showed your chronic pancreatitis but did not show any other explanation for your symptoms.  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    At home, please take the Reglan we have prescribed you for your nausea and vomiting.    Follow-up with your primary doctor in 2-3 days regarding your visit.  Follow-up with vascular surgery when possible.  Return immediately to the emergency department if you experience any of the following: Worsening pain, vomiting despite the medications, or any other concerning symptoms.    Thank you for visiting our Emergency Department. It was a pleasure taking care of you today.

## 2022-10-28 NOTE — ED Provider Notes (Signed)
[facility]-EMERGENCY DEPT Provider Note   CSN: [id number] Arrival date & time: 10/28/22  1036     History  Chief Complaint  Patient presents with   Abdominal Pain    Robin Guerrero is a 36 y.o. female.  36 year old female with a history of alcoholic pancreatitis, portal hypertension and isolated esophageal varices, type B aortic dissection, GERD, and hypertension who presents emergency department with right flank pain and nausea and vomiting.  Patient has longstanding history of the symptoms.  Says that he was having frequent hematemesis with last episode on October 20.  Over the past several days her vomiting is gotten worse but has been yellow without blood.  Denies any bowel changes at all or melena or hematochezia.  Says that she is also having right-sided flank pain that is constant without any exacerbating or alleviating factors.  Has not drink alcohol in several months.  Says her symptoms persisted despite Zofran so decided to come in.  Denies any dysuria or frequency. No fevers. Is followed at [facility] by GI see below for last EGD.  Does use vape pen  September 2023 EGD/EUS: -type 1 isolated gastric varices; erythema in the stomach, biopsied; e/o chronic panc with atrophy, diffuse echogenicity, lobularity without honeycombing or hyper echoic strands; PD normal aside from some dilation of side-branches in the panc body and tail; few cysts in the menu (first measured 13 x 14 mm, 2nd measured 6 x 3, 3rd measured 5 x 4); normal bile duct, small amount sludge in gallbladder, few enlarged LN in perigastric and peripancreatic lesion, celiac block performed.        Home Medications Prior to Admission medications   Medication Sig Start Date End Date Taking? Authorizing Provider  metoCLOPramide (REGLAN) 10 MG tablet Take 1 tablet (10 mg total) by mouth every 6 (six) hours. 10/28/22  Yes Rondel Baton, MD  acetaminophen (TYLENOL) 500 MG tablet Take 1,000 mg by  mouth every 6 (six) hours as needed for moderate pain.    [provider]  amLODipine (NORVASC) 10 MG tablet Take 1 tablet (10 mg total) by mouth daily. 03/08/22   Patwardhan, Anabel Bene, MD  cloNIDine (CATAPRES) 0.2 MG tablet Take 1 tablet (0.2 mg total) by mouth every 8 (eight) hours. 03/08/22 06/12/23  Patwardhan, Anabel Bene, MD  doxycycline (VIBRAMYCIN) 100 MG capsule Take 1 capsule (100 mg total) by mouth 2 (two) times daily. 09/26/22   Mansouraty, Netty Starring., MD  folic acid (FOLVITE) 800 MCG tablet Take 400 mcg by mouth daily.    [provider]  hydrALAZINE (APRESOLINE) 100 MG tablet TAKE 1 TABLET BY MOUTH 3 TIMES DAILY. 08/20/22   Patwardhan, Manish J, MD  losartan (COZAAR) 100 MG tablet Take 1 tablet (100 mg total) by mouth daily. 06/08/22 09/06/22  Dorcas Carrow, MD  metroNIDAZOLE (FLAGYL) 250 MG tablet Take 1 tablet (250 mg total) by mouth 4 (four) times daily. 09/26/22   Mansouraty, Netty Starring., MD  Multiple Vitamins-Minerals (MULTIVITAMIN WITH MINERALS) tablet Take 1 tablet by mouth daily.    [provider]  omeprazole (PRILOSEC) 20 MG capsule Take 1 capsule (20 mg total) by mouth 2 (two) times daily before a meal. 09/26/22   Mansouraty, Netty Starring., MD  ondansetron (ZOFRAN) 4 MG tablet Take 1 tablet (4 mg total) by mouth every 8 (eight) hours as needed for nausea or vomiting. 06/08/22   Dorcas Carrow, MD  oxyCODONE (ROXICODONE) 5 MG immediate release tablet Take 1 tablet (5 mg total) by mouth  every 8 (eight) hours as needed. 09/11/22 09/11/23  Mansouraty, Netty Starring., MD  Pancrelipase, Lip-Prot-Amyl, (ZENPEP) 40000-126000 units CPEP Take 1-2 capsules by mouth See admin instructions. 2 tablets with each meal and 1 tablet with snacks    [provider]  pantoprazole (PROTONIX) 40 MG tablet Take 40 mg by mouth 2 (two) times daily before a meal.    [provider]  polyethylene glycol (MIRALAX / GLYCOLAX) 17 g packet Take 17 g by mouth daily. Patient taking  differently: Take 17 g by mouth daily as needed for moderate constipation. 06/15/22   Regalado, Belkys A, MD  promethazine (PHENERGAN) 12.5 MG tablet Take 1 tablet (12.5 mg total) by mouth every 8 (eight) hours as needed for nausea or vomiting. 07/05/22   Mansouraty, Netty Starring., MD  propranolol (INDERAL) 10 MG tablet Take 1 tablet (10 mg total) by mouth 2 (two) times daily. 06/08/22 09/06/22  Dorcas Carrow, MD  vitamin B-12 1000 MCG tablet Take 1 tablet (1,000 mcg total) by mouth daily. 06/15/22   Regalado, Belkys A, MD      Allergies    Ativan [lorazepam]    Review of Systems   Review of Systems  Physical Exam Updated Vital Signs BP (!) 144/104   Pulse 97   Temp 98.6 F (37 C) (Oral)   Resp 18   SpO2 97%  Physical Exam Vitals and nursing note reviewed.  Constitutional:      General: She is not in acute distress.    Appearance: She is well-developed.  HENT:     Head: Normocephalic and atraumatic.     Right Ear: External ear normal.     Left Ear: External ear normal.     Nose: Nose normal.     Mouth/Throat:     Mouth: Mucous membranes are dry.     Pharynx: Oropharynx is clear.  Eyes:     Extraocular Movements: Extraocular movements intact.     Conjunctiva/sclera: Conjunctivae normal.     Pupils: Pupils are equal, round, and reactive to light.  Cardiovascular:     Rate and Rhythm: Regular rhythm. Tachycardia present.     Heart sounds: No murmur heard. Pulmonary:     Effort: Pulmonary effort is normal. No respiratory distress.  Abdominal:     General: Abdomen is flat. There is no distension.     Palpations: Abdomen is soft. There is no mass.     Tenderness: There is abdominal tenderness (Diffuse). There is no guarding.  Musculoskeletal:        General: No swelling.     Cervical back: Normal range of motion and neck supple.     Right lower leg: No edema.     Left lower leg: No edema.  Skin:    General: Skin is warm and dry.     Capillary Refill: Capillary refill takes more  than 3 seconds.  Neurological:     Mental Status: She is alert and oriented to person, place, and time. Mental status is at baseline.  Psychiatric:        Mood and Affect: Mood normal.     ED Results / Procedures / Treatments   Labs (all labs ordered are listed, but only abnormal results are displayed) Labs Reviewed  COMPREHENSIVE METABOLIC PANEL - Abnormal; Notable for the following components:      Result Value   AST 13 (*)    All other components within normal limits  LIPASE, BLOOD - Abnormal; Notable for the following components:  Lipase 98 (*)    All other components within normal limits  ETHANOL  CBC WITH DIFFERENTIAL/PLATELET  I-STAT BETA HCG BLOOD, ED ([facility], [facility], [facility] ONLY)    EKG None  Radiology CT ANGIO GI BLEED  Addendum Date: 10/28/2022   ADDENDUM REPORT: 10/28/2022 22:47 ADDENDUM: There has been chronic narrowing of the splenic vein on multiple recent priors with collateral vessels in the left upper quadrant. Nonvisualized short segment of splenic vein on series 13, image 23 but some motion degradation could reflect more chronic string like narrowing of splenic vein versus short segment occlusion in the region of chronically narrowed splenic vein. Electronically Signed   By: Jasmine Pang M.D.   On: 10/28/2022 22:47   Result Date: 10/28/2022 CLINICAL DATA:  Nausea vomiting EXAM: CTA ABDOMEN AND PELVIS WITHOUT AND WITH CONTRAST TECHNIQUE: Multidetector CT imaging of the abdomen and pelvis was performed using the standard protocol during bolus administration of intravenous contrast. Multiplanar reconstructed images and MIPs were obtained and reviewed to evaluate the vascular anatomy. RADIATION DOSE REDUCTION: This exam was performed according to the departmental dose-optimization program which includes automated exposure control, adjustment of the mA and/or kV according to patient size and/or use of iterative reconstruction technique. 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 No stenosis or occlusion. Celiac: Arises from the true lumen. Patent and without dissection occlusion aneurysm or stenosis SMA: Patent without evidence of aneurysm, dissection, vasculitis or significant stenosis. Arises from the true lumen Renals: Both renal arteries are patent without evidence of aneurysm, dissection, vasculitis, fibromuscular dysplasia or significant stenosis. Two right renal arteries arise from the false lumen. Two left renal arteries arise from the true lumen. IMA: Patent without evidence of aneurysm, dissection, vasculitis or significant stenosis. Arises from the true lumen. Inflow: Dissection extends into the right common iliac artery to the level of the bifurcation. Negative for stenosis or occlusion. 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 Proximal Outflow: Bilateral common femoral and visualized portions of the superficial and profunda femoral arteries are patent without evidence of aneurysm, dissection, vasculitis or significant stenosis. Veins: Probable occlusion of the splenic vein, series 13, image 22. Multiple collateral vessels in the left upper quadrant. Review of the MIP images confirms the above findings. NON-VASCULAR Lower chest: Lung bases demonstrate no acute airspace disease. Hepatobiliary: No focal hepatic abnormality. Fluid surrounding the gallbladder. No calcified stones. No biliary dilatation. Pancreas: Peripancreatic fluid and stranding, greatest about the head of pancreas. Multiple low-density cystic lesions in the pancreas. Spleen: Normal in size without focal abnormality. Adrenals/Urinary Tract: Adrenal glands are unremarkable. Kidneys are normal, without renal  calculi, focal lesion, or hydronephrosis. Bladder is unremarkable. Stomach/Bowel: Stomach is within normal limits. Appendix appears normal. No evidence of bowel wall thickening, distention, or inflammatory changes. Negative for extravasation into the bowel to suggest acute GI bleeding at this time. Lymphatic: No suspicious lymph nodes. Subcentimeter retroperitoneal nodes. Reproductive: Lobulated mass in the right pelvis presumably corresponds to lower uterine segment fibroid. No adnexal mass. Other: No free air.  Small volume free fluid in the pelvis. Musculoskeletal: No acute or significant osseous findings. IMPRESSION: VASCULAR 1. Negative for acute GI bleeding at this time. 2. 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 3. Findings suspicious for splenic vein occlusion. Multiple collateral vessels in the left upper quadrant. NON-VASCULAR 1. Fluid and probable inflammatory change surrounding the gallbladder 2. Peripancreatic stranding with heterogeneous pancreatic enhancement likely due to pancreatitis. Diffuse pancreatic ductal dilatation with multiple small cysts/pseudocysts. 3.  Fibroid uterus Electronically Signed: By: Jasmine Pang M.D. On: 10/28/2022 19:50   US Abdomen Limited RUQ (LIVER/GB)  Result Date: 10/28/2022 CLINICAL DATA:  Chronic right upper quadrant abdominal pain EXAM: ULTRASOUND ABDOMEN LIMITED RIGHT UPPER QUADRANT COMPARISON:  CTA abdomen/pelvis dated 10/28/2022. CT abdomen/pelvis dated 07/30/2022. FINDINGS: Gallbladder: No gallstones or gallbladder wall thickening. However, there is pericholecystic fluid. When correlating with prior CT, this is likely secondary to the patient's acute pancreatitis. Common bile duct: Diameter: 10 mm, unchanged.  No  intrahepatic ductal dilatation. Liver: No focal lesion identified. Within normal limits in parenchymal echogenicity. Portal vein is patent on color Doppler imaging with normal direction of blood flow towards the liver. Other: None. IMPRESSION: Pericholecystic fluid, likely secondary to the patient's acute pancreatitis. No cholelithiasis. Dilated common duct, measuring 10 mm, unchanged. Electronically Signed   By: Charline Bills M.D.   On: 10/28/2022 21:29    Procedures Procedures   Medications Ordered in ED Medications  droperidol (INAPSINE) 2.5 MG/ML injection 1.25 mg (1.25 mg Intravenous Given 10/28/22 1849)  lactated ringers bolus 1,000 mL (0 mLs Intravenous Stopped 10/28/22 2036)  morphine (PF) 4 MG/ML injection 4 mg (4 mg Intravenous Given 10/28/22 1849)  iohexol (OMNIPAQUE) 350 MG/ML injection 100 mL (100 mLs Intravenous Contrast Given 10/28/22 1901)  lactated ringers bolus 1,000 mL (0 mLs Intravenous Stopped 10/28/22 2256)    ED Course/ Medical Decision Making/ A&P Clinical Course as of 10/29/22 1557  Mon Oct 28, 2022  2240 Discussed with radiologist Dr. 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  Patient reassessed and is not having significant tenderness in her left upper quadrant so do not feel any acute intervention is required and will have her follow-up with vascular surgery about this. [RP]    Clinical Course User Index [RP] Rondel Baton, MD                           Medical Decision Making Amount and/or Complexity of Data Reviewed Radiology: ordered.  Risk Prescription drug management.   Robin Guerrero is a 36 y.o. female with comorbidities that complicate the patient evaluation including history of alcoholic pancreatitis, portal hypertension and isolated esophageal varices, type B aortic dissection, GERD,  and hypertension who presents emergency department with right flank pain and nausea and vomiting.  This patient presents to the ED for concern of complaints listed in HPI, this involves an extensive number of treatment options, and is a complaint that carries with it a high risk of complications and morbidity. Disposition including potential need for admission considered.   Initial Ddx:  Chronic pancreatitis, bowel obstruction, cannabinoid hyperemesis, GI bleed, dissection  MDM:  With patient's nausea and vomiting feel it is likely due to her chronic pancreatitis or cannabinoid hyperemesis.  No abdominal surgeries so low risk for bowel obstruction.  Does not appear to have been having GI bleeding symptoms since October 20 so feel  this is less likely as well but is high risk so will obtain CT scan and labs.  With her flank pain and history of dissection we will also obtain CTA to ensure that her dissection has not worsened.  Plan:  Labs Lipase Antiemetics Pain medication Fluids CTA abdomen pelvis  ED Summary/Re-evaluation:  Patient reevaluated and is feeling much better after the droperidol and morphine.  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  Her dissection was stable on the scan.  Also had inflammation surrounding the pancreas that be concerning for possible acute on chronic pancreatitis.  It also showed possible inflammation surrounding the gallbladder so right upper quadrant ultrasound was obtained which did not show evidence of cholecystitis.  Patient was informed of these findings and the need to follow-up with vascular surgery about her splenic vein occlusion.  Her hemoglobin was stable at her hemodynamics also remained stable so feel that GI bleed is highly unlikely.  Patient was able to tolerate p.o. and was informed to follow-up with her primary doctor, GI, and  vascular surgeon.  She was given a prescription of Reglan to take at home.  Dispo: DC Home. Return precautions discussed including, but not limited to, those listed in the AVS. Allowed pt time to ask questions which were answered fully prior to dc.   Records reviewed Outpatient Clinic Notes and ED Visit Notes The following labs were independently interpreted: Chemistry and CBC and show no acute abnormality I personally reviewed and interpreted cardiac monitoring: normal sinus rhythm  I have reviewed the patients home medications and made adjustments as needed  Final Clinical Impression(s) / ED Diagnoses Final diagnoses:  Generalized abdominal pain  Nausea and vomiting, unspecified vomiting type  Chronic pancreatitis, unspecified pancreatitis type (HCC)    Rx / DC Orders ED Discharge Orders          Ordered    metoCLOPramide (REGLAN) 10 MG tablet  Every 6 hours        10/28/22 2242              Rondel Baton, MD 10/29/22 1558

## 2022-10-28 NOTE — ED Triage Notes (Signed)
Pt arrived via POV, c/o n/v blood, states has been going on since May, worsening today. Has been seen in the past with no definitive dx.

## 2022-10-28 NOTE — ED Provider Triage Note (Signed)
Emergency Medicine Provider Triage Evaluation Note  Robin Guerrero , a 36 y.o. female  was evaluated in triage.  Pt complains of upper abdominal pain since May that has gradually worsneing. The patient reports that she has been to [facility] and "they don't know why either" patietn reports it is chronic pancreatitis. No bloody or blakc emesis or stool. She reports she vomits daily since May.  Review of Systems  Positive:  Negative:   Physical Exam  BP (!) 142/108   Pulse 98   Temp 98.5 F (36.9 C) (Oral)   Resp 18   SpO2 100%  Gen:   Awake, no distress   Resp:  Normal effort  MSK:   Moves extremities without difficulty  Other:  Epigastric tenderness. Soft.  Medical Decision Making  Medically screening exam initiated at 12:06 PM.  Appropriate orders placed.  Rin Dray was informed that the remainder of the evaluation will be completed by another provider, this initial triage assessment does not replace that evaluation, and the importance of remaining in the ED until their evaluation is complete.  Labs ordered   Sherrell Puller, [place] 10/28/22 1208

## 2022-10-30 ENCOUNTER — Emergency Department (HOSPITAL_COMMUNITY)
Admission: EM | Admit: 2022-10-30 | Discharge: 2022-10-31 | Disposition: A | Discharge status: Home / Self Care | Payer: 59 | Source: Home / Self Care | Attending: Emergency Medicine | Admitting: Emergency Medicine

## 2022-10-30 ENCOUNTER — Emergency Department (HOSPITAL_COMMUNITY): Payer: 59

## 2022-10-30 ENCOUNTER — Other Ambulatory Visit: Payer: Self-pay

## 2022-10-30 DIAGNOSIS — I71012 Dissection of descending thoracic aorta: Secondary | ICD-10-CM | POA: Diagnosis present

## 2022-10-30 DIAGNOSIS — I10 Essential (primary) hypertension: Secondary | ICD-10-CM | POA: Diagnosis present

## 2022-10-30 DIAGNOSIS — Z8579 Personal history of other malignant neoplasms of lymphoid, hematopoietic and related tissues: Secondary | ICD-10-CM | POA: Insufficient documentation

## 2022-10-30 DIAGNOSIS — F1011 Alcohol abuse, in remission: Secondary | ICD-10-CM | POA: Diagnosis present

## 2022-10-30 DIAGNOSIS — N9489 Other specified conditions associated with female genital organs and menstrual cycle: Secondary | ICD-10-CM | POA: Insufficient documentation

## 2022-10-30 DIAGNOSIS — R000 Tachycardia, unspecified: Secondary | ICD-10-CM | POA: Insufficient documentation

## 2022-10-30 DIAGNOSIS — E119 Type 2 diabetes mellitus without complications: Secondary | ICD-10-CM | POA: Insufficient documentation

## 2022-10-30 DIAGNOSIS — Z720 Tobacco use: Secondary | ICD-10-CM | POA: Diagnosis present

## 2022-10-30 DIAGNOSIS — K859 Acute pancreatitis without necrosis or infection, unspecified: Secondary | ICD-10-CM | POA: Insufficient documentation

## 2022-10-30 DIAGNOSIS — K852 Alcohol induced acute pancreatitis without necrosis or infection: Secondary | ICD-10-CM | POA: Diagnosis present

## 2022-10-30 DIAGNOSIS — Z79899 Other long term (current) drug therapy: Secondary | ICD-10-CM | POA: Insufficient documentation

## 2022-10-30 DIAGNOSIS — F1021 Alcohol dependence, in remission: Secondary | ICD-10-CM

## 2022-10-30 DIAGNOSIS — F32A Depression, unspecified: Secondary | ICD-10-CM | POA: Diagnosis present

## 2022-10-30 DIAGNOSIS — R1084 Generalized abdominal pain: Secondary | ICD-10-CM

## 2022-10-30 DIAGNOSIS — K863 Pseudocyst of pancreas: Secondary | ICD-10-CM | POA: Diagnosis present

## 2022-10-30 DIAGNOSIS — R112 Nausea with vomiting, unspecified: Secondary | ICD-10-CM | POA: Diagnosis present

## 2022-10-30 LAB — RAPID URINE DRUG SCREEN, HOSP PERFORMED
Amphetamines: NOT DETECTED
Barbiturates: NOT DETECTED
Benzodiazepines: NOT DETECTED
Cocaine: NOT DETECTED
Opiates: POSITIVE — AB
Tetrahydrocannabinol: POSITIVE — AB

## 2022-10-30 LAB — URINALYSIS, ROUTINE W REFLEX MICROSCOPIC
Bilirubin Urine: NEGATIVE
Glucose, UA: NEGATIVE mg/dL
Hgb urine dipstick: NEGATIVE
Ketones, ur: 80 mg/dL — AB
Nitrite: NEGATIVE
Protein, ur: 100 mg/dL — AB
Specific Gravity, Urine: 1.023 (ref 1.005–1.030)
pH: 6 (ref 5.0–8.0)

## 2022-10-30 LAB — COMPREHENSIVE METABOLIC PANEL
ALT: 13 U/L (ref 0–44)
AST: 14 U/L — ABNORMAL LOW (ref 15–41)
Albumin: 4 g/dL (ref 3.5–5.0)
Alkaline Phosphatase: 55 U/L (ref 38–126)
Anion gap: 15 (ref 5–15)
BUN: <5 mg/dL — ABNORMAL LOW (ref 6–20)
CO2: 19 mmol/L — ABNORMAL LOW (ref 22–32)
Calcium: 9.7 mg/dL (ref 8.9–10.3)
Chloride: 105 mmol/L (ref 98–111)
Creatinine, Ser: 0.72 mg/dL (ref 0.44–1.00)
GFR, Estimated: >60 mL/min (ref >60)
Glucose, Bld: 85 mg/dL (ref 70–99)
Potassium: 3.7 mmol/L (ref 3.5–5.1)
Sodium: 139 mmol/L (ref 135–145)
Total Bilirubin: 1.4 mg/dL — ABNORMAL HIGH (ref 0.3–1.2)
Total Protein: 8 g/dL (ref 6.5–8.1)

## 2022-10-30 LAB — CBC WITH DIFFERENTIAL/PLATELET
Abs Immature Granulocytes: 0.02 10*3/uL (ref 0.00–0.07)
Basophils Absolute: 0 10*3/uL (ref 0.0–0.1)
Basophils Relative: 0 %
Eosinophils Absolute: 0.1 10*3/uL (ref 0.0–0.5)
Eosinophils Relative: 1 %
HCT: 40.2 % (ref 36.0–46.0)
Hemoglobin: 13.8 g/dL (ref 12.0–15.0)
Immature Granulocytes: 0 %
Lymphocytes Relative: 14 %
Lymphs Abs: 1.2 10*3/uL (ref 0.7–4.0)
MCH: 31.7 pg (ref 26.0–34.0)
MCHC: 34.3 g/dL (ref 30.0–36.0)
MCV: 92.2 fL (ref 80.0–100.0)
Monocytes Absolute: 0.5 10*3/uL (ref 0.1–1.0)
Monocytes Relative: 6 %
Neutro Abs: 6.5 10*3/uL (ref 1.7–7.7)
Neutrophils Relative %: 79 %
Platelets: 279 10*3/uL (ref 150–400)
RBC: 4.36 MIL/uL (ref 3.87–5.11)
RDW: 13.9 % (ref 11.5–15.5)
WBC: 8.3 10*3/uL (ref 4.0–10.5)
nRBC: 0 % (ref 0.0–0.2)

## 2022-10-30 LAB — I-STAT BETA HCG BLOOD, ED (MC, WL, AP ONLY): I-stat hCG, quantitative: <5 m[IU]/mL (ref <5)

## 2022-10-30 LAB — LIPASE, BLOOD: Lipase: 89 U/L — ABNORMAL HIGH (ref 11–51)

## 2022-10-30 MED ORDER — OXYCODONE-ACETAMINOPHEN 5-325 MG PO TABS
2.0000 | ORAL_TABLET | Freq: Once | ORAL | Status: DC
  Filled 2022-10-30: qty 2

## 2022-10-30 MED ORDER — HYDROMORPHONE HCL 1 MG/ML IJ SOLN
1.0000 mg | Freq: Once | INTRAMUSCULAR | Status: DC
  Filled 2022-10-30: qty 1

## 2022-10-30 MED ORDER — DROPERIDOL 2.5 MG/ML IJ SOLN
2.5000 mg | Freq: Once | INTRAMUSCULAR | Status: AC
  Administered 2022-10-30: 2.5 mg via INTRAVENOUS
  Filled 2022-10-30: qty 2

## 2022-10-30 MED ORDER — SODIUM CHLORIDE 0.9 % IV BOLUS
1000.0000 mL | Freq: Once | INTRAVENOUS | Status: AC
  Administered 2022-10-30: 1000 mL via INTRAVENOUS

## 2022-10-30 MED ORDER — MAGNESIUM SULFATE 2 GM/50ML IV SOLN
2.0000 g | Freq: Once | INTRAVENOUS | Status: AC
  Administered 2022-10-30: 2 g via INTRAVENOUS
  Filled 2022-10-30: qty 50

## 2022-10-30 MED ORDER — IOHEXOL 350 MG/ML SOLN
75.0000 mL | Freq: Once | INTRAVENOUS | Status: AC | PRN
  Administered 2022-10-30: 75 mL via INTRAVENOUS

## 2022-10-30 MED ORDER — ONDANSETRON HCL 4 MG/2ML IJ SOLN
4.0000 mg | Freq: Once | INTRAMUSCULAR | Status: AC
  Administered 2022-10-30: 4 mg via INTRAVENOUS
  Filled 2022-10-30: qty 2

## 2022-10-30 MED ORDER — HYDROMORPHONE HCL 1 MG/ML IJ SOLN
1.0000 mg | Freq: Once | INTRAMUSCULAR | Status: AC
  Administered 2022-10-30: 1 mg via INTRAVENOUS
  Filled 2022-10-30: qty 1

## 2022-10-30 NOTE — ED Triage Notes (Signed)
Pt arrives POV c/o abdominal pain and n/v since Saturday. Pt also endorses blood in stool starting this morning. Hx of chronic pancreatitis.

## 2022-10-30 NOTE — ED Notes (Signed)
Patient transported to CT 

## 2022-10-30 NOTE — ED Provider Notes (Signed)
[facility] DEPARTMENT Provider Note   CSN: [id number] Arrival date & time: 10/30/22  1511     History DM,CKD, Renal insuffiencey  Chief Complaint  Patient presents with   Abdominal Pain    Robin Guerrero is a 36 y.o. female.  36 y.o female with a PMH of chronic pancreatitis, diabetes, multiple myeloma, who presents with chief complaint of nausea and vomiting.  Patient reports her symptoms began today, multiple episodes of hematemesis with specks of blood.  She also reports bleeding through her rectum, passing multiple clots.  She is endorsing pain along her entire abdomen severe in nature.  Patient does take Zofran along with Phenergan at home to help with nausea and vomiting but was unable to keep her symptoms at [facility].  She does report having an endoscopy at the end of October, has not follow-up with anyone since.  Patient reports she does not drink alcohol anymore.  She also denies any marijuana use.     The history is provided by the patient and medical records.  Abdominal Pain Pain location:  Generalized Pain quality: aching   Pain radiates to:  Does not radiate Pain severity:  Severe Onset quality:  Sudden Duration:  1 day Timing:  Intermittent Progression:  Worsening Chronicity:  Recurrent Context: not alcohol use, not eating, not laxative use and not recent illness   Associated symptoms: nausea and vomiting   Associated symptoms: no chest pain, no fever, no shortness of breath and no sore throat        Home Medications Prior to Admission medications   Medication Sig Start Date End Date Taking? Authorizing Provider  acetaminophen (TYLENOL) 500 MG tablet Take 1,000 mg by mouth every 6 (six) hours as needed for moderate pain.   Yes [provider]  amLODipine (NORVASC) 10 MG tablet Take 1 tablet (10 mg total) by mouth daily. 03/08/22  Yes Patwardhan, Reynold Bowen, MD  Bismuth/Metronidaz/Tetracyclin [phone] MG CAPS Take 3  capsules by mouth 4 (four) times daily. 09/26/22  Yes [provider]  cloNIDine (CATAPRES) 0.2 MG tablet Take 1 tablet (0.2 mg total) by mouth every 8 (eight) hours. 03/08/22 06/12/23 Yes Patwardhan, Manish J, MD  hydrALAZINE (APRESOLINE) 100 MG tablet TAKE 1 TABLET BY MOUTH 3 TIMES DAILY. 08/20/22  Yes Patwardhan, Manish J, MD  losartan (COZAAR) 100 MG tablet Take 1 tablet (100 mg total) by mouth daily. 06/08/22 10/30/22 Yes GhimireDante Gang, MD  Multiple Vitamins-Minerals (MULTIVITAMIN WITH MINERALS) tablet Take 1 tablet by mouth daily.   Yes [provider]  ondansetron (ZOFRAN) 4 MG tablet Take 1 tablet (4 mg total) by mouth every 8 (eight) hours as needed for nausea or vomiting. 06/08/22  Yes Ghimire, Dante Gang, MD  Pancrelipase, Lip-Prot-Amyl, (ZENPEP) 40000-126000 units CPEP Take 1-2 capsules by mouth See admin instructions. 2 tablets with each meal and 1 tablet with snacks   Yes [provider]  pantoprazole (PROTONIX) 40 MG tablet Take 40 mg by mouth 2 (two) times daily before a meal.   Yes [provider]  polyethylene glycol (MIRALAX / GLYCOLAX) 17 g packet Take 17 g by mouth daily. Patient taking differently: Take 17 g by mouth daily as needed for moderate constipation. 06/15/22  Yes Regalado, Belkys A, MD  propranolol (INDERAL) 10 MG tablet Take 1 tablet (10 mg total) by mouth 2 (two) times daily. 06/08/22 10/30/22 Yes Barb Merino, MD  vitamin B-12 1000 MCG tablet Take 1 tablet (1,000 mcg total) by mouth daily. 06/15/22  Yes  Regalado, Belkys A, MD  doxycycline (VIBRAMYCIN) 100 MG capsule Take 1 capsule (100 mg total) by mouth 2 (two) times daily. Patient not taking: Reported on 10/30/2022 09/26/22   Mansouraty, Telford Nab., MD  metoCLOPramide (REGLAN) 10 MG tablet Take 1 tablet (10 mg total) by mouth every 6 (six) hours. Patient not taking: Reported on 10/30/2022 10/28/22   Fransico Meadow, MD  metroNIDAZOLE (FLAGYL) 250 MG tablet Take 1 tablet (250 mg total) by  mouth 4 (four) times daily. Patient not taking: Reported on 10/30/2022 09/26/22   Mansouraty, Telford Nab., MD  omeprazole (PRILOSEC) 20 MG capsule Take 1 capsule (20 mg total) by mouth 2 (two) times daily before a meal. Patient not taking: Reported on 10/30/2022 09/26/22   Mansouraty, Telford Nab., MD  oxyCODONE (ROXICODONE) 5 MG immediate release tablet Take 1 tablet (5 mg total) by mouth every 8 (eight) hours as needed. Patient not taking: Reported on 10/30/2022 09/11/22 09/11/23  Mansouraty, Telford Nab., MD  promethazine (PHENERGAN) 12.5 MG tablet Take 1 tablet (12.5 mg total) by mouth every 8 (eight) hours as needed for nausea or vomiting. Patient not taking: Reported on 10/30/2022 07/05/22   Mansouraty, Telford Nab., MD      Allergies    Ativan [lorazepam] and Tape    Review of Systems   Review of Systems  Constitutional:  Negative for fever.  HENT:  Negative for sore throat.   Respiratory:  Negative for shortness of breath.   Cardiovascular:  Negative for chest pain.  Gastrointestinal:  Positive for abdominal pain, nausea and vomiting.  Genitourinary:  Negative for flank pain.  Musculoskeletal:  Negative for back pain.  Neurological:  Negative for light-headedness and headaches.  All other systems reviewed and are negative.   Physical Exam Updated Vital Signs BP (!) 151/105   Pulse (!) 109   Temp 98.8 F (37.1 C) (Oral)   Resp 13   Ht _0  (1.727 m)   Wt 79.4 kg   SpO2 99%   BMI 26.61 kg/m  Physical Exam Vitals and nursing note reviewed.  Constitutional:      General: She is in acute distress.     Appearance: She is well-developed. She is not ill-appearing.  HENT:     Head: Normocephalic and atraumatic.  Cardiovascular:     Rate and Rhythm: Tachycardia present.  Pulmonary:     Effort: Pulmonary effort is normal.     Breath sounds: No wheezing or rales.  Abdominal:     General: Abdomen is flat. Bowel sounds are decreased.     Palpations: Abdomen is soft.      Tenderness: There is generalized abdominal tenderness. There is guarding. There is no right CVA tenderness or left CVA tenderness.  Skin:    General: Skin is warm and dry.  Neurological:     Mental Status: She is alert.     ED Results / Procedures / Treatments   Labs (all labs ordered are listed, but only abnormal results are displayed) Labs Reviewed  COMPREHENSIVE METABOLIC PANEL - Abnormal; Notable for the following components:      Result Value   CO2 19 (*)    BUN <5 (*)    AST 14 (*)    Total Bilirubin 1.4 (*)    All other components within normal limits  LIPASE, BLOOD - Abnormal; Notable for the following components:   Lipase 89 (*)    All other components within normal limits  URINALYSIS, ROUTINE W REFLEX MICROSCOPIC - Abnormal; Notable for  the following components:   APPearance HAZY (*)    Ketones, ur 80 (*)    Protein, ur 100 (*)    Leukocytes,Ua TRACE (*)    Bacteria, UA RARE (*)    All other components within normal limits  RAPID URINE DRUG SCREEN, HOSP PERFORMED - Abnormal; Notable for the following components:   Opiates POSITIVE (*)    Tetrahydrocannabinol POSITIVE (*)    All other components within normal limits  CBC WITH DIFFERENTIAL/PLATELET  I-STAT BETA HCG BLOOD, ED ([facility], [facility], [facility] ONLY)    EKG EKG Interpretation  Date/Time:  Wednesday October 30 2022 18:51:19 EST Ventricular Rate:  166 PR Interval:  112 QRS Duration: 90 QT Interval:  307 QTC Calculation: 511 R Axis:   91 Text Interpretation: Sinus tachycardia Borderline right axis deviation Prolonged QT interval  Confirmed by Garnette Gunner [phone]) on 10/30/2022 6:52:53 PM  Radiology CT ABDOMEN PELVIS W CONTRAST  Result Date: 10/30/2022 CLINICAL DATA:  Abdominal pain, blood in stool, known thoracic aortic dissection EXAM: CT ABDOMEN AND PELVIS WITH CONTRAST TECHNIQUE: Multidetector CT imaging of the abdomen and pelvis was performed using the standard protocol following bolus administration of  intravenous contrast. RADIATION DOSE REDUCTION: This exam was performed according to the departmental dose-optimization program which includes automated exposure control, adjustment of the mA and/or kV according to patient size and/or use of iterative reconstruction technique. CONTRAST:  82m OMNIPAQUE IOHEXOL 350 MG/ML SOLN COMPARISON:  10/28/2022, 07/30/2022 FINDINGS: Lower chest: No acute pleural or parenchymal lung disease. Hepatobiliary: No focal liver abnormality is seen. No gallstones, gallbladder wall thickening, or biliary dilatation. Pancreas: Inflammatory changes are seen surrounding the pancreas, with interstitial edema again seen within the pancreatic head and body. Findings are consistent with acute pancreatitis. Stable multiple small pancreatic cysts and cystic dilatation of the pancreatic duct. No evidence of abscess. Spleen: Normal in size without focal abnormality. Adrenals/Urinary Tract: Adrenal glands are unremarkable. Kidneys are normal, without renal calculi, focal lesion, or hydronephrosis. Bladder is unremarkable. Stomach/Bowel: No bowel obstruction or ileus. Normal appendix right lower quadrant. No bowel wall thickening or inflammatory change. Vascular/Lymphatic: Stable dissection of the descending thoracic aorta and abdominal aorta, extending into the right common iliac artery. 3.5 cm proximal abdominal aortic aneurysm at the level of the diaphragmatic hiatus. No change since prior exam. Numerous venous collaterals are seen within the left upper quadrant likely related to extrinsic narrowing of the splenic vein and porta splenic confluence due to inflammatory changes from pancreatitis. No pathologic adenopathy. Reproductive: Exophytic fibroid off the lower uterine segment on the right. Stable appearance of the ovaries. Other: Trace pelvic free fluid. No free intraperitoneal gas. No abdominal wall hernia. Musculoskeletal: No acute or destructive bony lesions. Reconstructed images demonstrate  no additional findings. IMPRESSION: 1. Stable acute pancreatitis. Numerous cystic lesions within the pancreas and irregular dilatation of the pancreatic duct are stable as well. 2. Stable type B chronic aortic dissection, extending from the descending thoracic aorta through the right common iliac artery. 3. 3.5 cm abdominal aortic aneurysm. Recommend follow-up every 2 years. Reference: J Am Coll Radiol 22778;24:235-361 4. 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 Numerous venous collaterals within the left quadrant. Electronically Signed   By: MRanda NgoM.D.   On: 10/30/2022 22:56    Procedures Procedures    Medications Ordered in ED Medications  oxyCODONE-acetaminophen (PERCOCET/ROXICET) 5-325 MG per tablet 2 tablet (2 tablets Oral Not Given 10/30/22 2131)  HYDROmorphone (DILAUDID) injection  1 mg (1 mg Intravenous Not Given 10/30/22 2329)  ondansetron (ZOFRAN) injection 4 mg (4 mg Intravenous Given 10/30/22 2124)  sodium chloride 0.9 % bolus 1,000 mL (0 mLs Intravenous Stopped 10/30/22 2249)  magnesium sulfate IVPB 2 g 50 mL (0 g Intravenous Stopped 10/30/22 2249)  HYDROmorphone (DILAUDID) injection 1 mg (1 mg Intravenous Given 10/30/22 2126)  droperidol (INAPSINE) 2.5 MG/ML injection 2.5 mg (2.5 mg Intravenous Given 10/30/22 2148)  sodium chloride 0.9 % bolus 1,000 mL (1,000 mLs Intravenous New Bag/Given 10/30/22 2250)  iohexol (OMNIPAQUE) 350 MG/ML injection 75 mL (75 mLs Intravenous Contrast Given 10/30/22 2240)    ED Course/ Medical Decision Making/ A&P Clinical Course as of 10/30/22 2346  Wed Oct 30, 2022  1914 Lipase(!): 89 Improved from her prior two days ago [JS]  2317 Tetrahydrocannabinol(!): POSITIVE History of hyperemesis cannabinoid. [JS]  2318 Lipase(!): 89 Trending down from 2 days ago. [JS]    Clinical Course User Index [JS] Janeece Fitting, PA-C                           Medical Decision  Making Amount and/or Complexity of Data Reviewed Labs:  Decision-making details documented in ED Course. Radiology: ordered.  Risk Prescription drug management.    This patient presents to the ED for concern of abdominal pain, nausea, vomiting, this involves a number of treatment options, and is a complaint that carries with it a high risk of complications and morbidity.  The differential diagnosis includes acute on chronic pancreatitis, pancreatic abscess versus cyclic vomiting.   Co morbidities: Discussed in HPI   Brief History:  Patient with underlying history of acute pancreatitis, discontinue alcohol use along with cyclic vomiting here with nausea and vomiting over the past 2 days.  Evaluated in the emergency department approximately 2 days ago with a negative work-up.  Recent endoscopy at the end of October for prior varices.  Does report some bleeding specks on her vomit, has not been able to do her symptoms despite Zofran along with Phenergan.  Also endorsing generalized abdominal pain without any focal point of tenderness.  EMR reviewed including pt PMHx, past surgical history and past visits to ER.   See HPI for more details   Lab Tests:  I ordered and independently interpreted labs.  The pertinent results include:    I personally reviewed all laboratory work and imaging. Metabolic panel without any acute abnormality specifically kidney function within normal limits and no significant electrolyte abnormalities. CBC without leukocytosis or significant anemia.  UDS positive for opioids, THC as well.  UA with rare bacteria and 0-5 white blood cell counts without any urinary symptoms.   Imaging Studies:  CT Abdomen and pelvis: 1. Stable acute pancreatitis. Numerous cystic lesions within the  pancreas and irregular dilatation of the pancreatic duct are stable  as well.  2. Stable type B chronic aortic dissection, extending from the  descending thoracic aorta through the  right common iliac artery.  3. 3.5 cm abdominal aortic aneurysm. Recommend follow-up every 2  years.  Reference: J Am Coll Radiol 9470;76:151-834.  4. 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 Numerous  venous collaterals within the left quadrant.   Cardiac Monitoring:  The patient was maintained on a cardiac monitor.  I personally viewed and interpreted the cardiac monitored which showed an underlying rhythm of: Sinus tachycardia 166 EKG non-ischemic   Medicines ordered:  I ordered medication including dilaudid, droperidol  for symptomatic treatment Reevaluation of the patient after these medicines showed that the patient stayed the same I have reviewed the patients home medicines and have made adjustments as needed   Critical Interventions:   patient given multiple rounds of medication such as droperidol, Dilaudid, Zofran, replacement of magnesium along with 2 L bolus to help with ongoing tachycardia without much improvement in her symptoms.  Reevaluation:  After the interventions noted above I re-evaluated patient and found that they have :stayed the same   Social Determinants of Health:  The patient's social determinants of health were a factor in the care of this patient   Problem List / ED Course:  Patient here with prior history of alcoholic pancreatitis here with nausea and vomiting consistent with specks of blood.  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  She also reports discontinuing marijuana although UDS is positive for THC on today's visit.  When patient arrived to the room she was tachycardic with a heart rate in the 170s, described bilateral hand tingling, and was having a hard time catching her breath, does not have any prior history of anxiety but patient was likely hyperventilating at this time.  Her labs have been  reviewed by me with a CBC that stable, her lipase is actually trending down.  She required multiple rounds of medication such as droperidol, Zofran, Dilaudid, bolus x2 to help with tachycardia without much improvement in symptoms.  She continues to endorse pain along her abdomen. Patient was evaluated in the ED 2 days ago for the same complaints, she was sent home with antiemetics which she is unable to keep down.  She continues to endorse nausea and is not tolerating any of the p.o. meds.  Continues to be tachycardic with a heart rate in the 100s.  He did reCT patient in order to rule out any acute pancreatic abscess or other infection.  CT of her abdomen showed acute pancreatitis, given a second round of Dilaudid.  At this time patient's pain continues to be uncontrolled, I do feel that she will need admission for further management of pain and hydration.  Dispostion:  After consideration of the diagnostic results and the patients response to treatment, I feel that the patent would benefit from admission for pain control.    11:44 PM Spoke to hospitalist Dr. Claria Dice, who agrees to admit patient for further pain control.    Portions of this note were generated with [employer]. Dictation errors may occur despite best attempts at proofreading.   Final Clinical Impression(s) / ED Diagnoses Final diagnoses:  Generalized abdominal pain  Acute pancreatitis, unspecified complication status, unspecified pancreatitis type    Rx / DC Orders ED Discharge Orders     None         Janeece Fitting, PA-C 10/30/22 2346    Cristie Hem, MD 11/07/22 [phone]

## 2022-10-30 NOTE — ED Notes (Signed)
Pt reporting increased anxiety, HR remains in the 140s. Soto PA made aware

## 2022-10-30 NOTE — ED Notes (Signed)
Pt ambulated to restroom at this time. Urine cup given.

## 2022-10-30 NOTE — ED Provider Triage Note (Signed)
Emergency Medicine Provider Triage Evaluation Note  Robin Guerrero , a 36 y.o. female  was evaluated in triage.  Pt complains of abdominal pain nausea and vomiting.  She has a history of the same.  Patient reports specks of blood in her vomitus and blood in her stool.  She has a history of recurrent episodes of pancreatitis but denies any alcohol or marijuana abuse.  She has been unable to hold down any foods or fluid.  She was seen 2 days ago had a CT angiogram of the abdomen that showed pericholecystic fluid thought to be secondary to acute pancreatitis.  Patient's pain and nausea unrelieved.  Review of Systems  Positive: Vomiting Negative: Diarrhea  Physical Exam  BP (!) 171/123   Pulse (!) 145   Temp 98.9 F (37.2 C)   Resp (!) 22   Ht '5\' 8"'$  (1.727 m)   Wt 79.4 kg   SpO2 100%   BMI 26.61 kg/m  Gen:   Awake, no distress   Resp:  Normal effort  MSK:   Moves extremities without difficulty  Other:    Medical Decision Making  Medically screening exam initiated at 5:37 PM.  Appropriate orders placed.  Marbella Laning was informed that the remainder of the evaluation will be completed by another provider, this initial triage assessment does not replace that evaluation, and the importance of remaining in the ED until their evaluation is complete.     Margarita Mail, PA-C 10/30/22 1739

## 2022-10-30 NOTE — ED Notes (Signed)
Unsuccessful IV stick x2 by this RN

## 2022-10-31 ENCOUNTER — Other Ambulatory Visit: Payer: Self-pay

## 2022-10-31 ENCOUNTER — Emergency Department (HOSPITAL_COMMUNITY): Payer: 59

## 2022-10-31 ENCOUNTER — Inpatient Hospital Stay (HOSPITAL_COMMUNITY)
Admission: EM | Admit: 2022-10-31 | Discharge: 2022-11-03 | DRG: 438 | Disposition: A | Discharge status: Home / Self Care | Payer: 59 | Attending: Family Medicine | Admitting: Family Medicine

## 2022-10-31 DIAGNOSIS — D6959 Other secondary thrombocytopenia: Secondary | ICD-10-CM | POA: Diagnosis present

## 2022-10-31 DIAGNOSIS — N189 Chronic kidney disease, unspecified: Secondary | ICD-10-CM | POA: Diagnosis present

## 2022-10-31 DIAGNOSIS — Z5329 Procedure and treatment not carried out because of patient's decision for other reasons: Secondary | ICD-10-CM | POA: Diagnosis present

## 2022-10-31 DIAGNOSIS — F419 Anxiety disorder, unspecified: Secondary | ICD-10-CM | POA: Diagnosis present

## 2022-10-31 DIAGNOSIS — Z79899 Other long term (current) drug therapy: Secondary | ICD-10-CM

## 2022-10-31 DIAGNOSIS — Z888 Allergy status to other drugs, medicaments and biological substances status: Secondary | ICD-10-CM | POA: Diagnosis not present

## 2022-10-31 DIAGNOSIS — I728 Aneurysm of other specified arteries: Secondary | ICD-10-CM | POA: Diagnosis present

## 2022-10-31 DIAGNOSIS — E876 Hypokalemia: Secondary | ICD-10-CM | POA: Diagnosis present

## 2022-10-31 DIAGNOSIS — D72819 Decreased white blood cell count, unspecified: Secondary | ICD-10-CM | POA: Diagnosis present

## 2022-10-31 DIAGNOSIS — F17210 Nicotine dependence, cigarettes, uncomplicated: Secondary | ICD-10-CM | POA: Diagnosis present

## 2022-10-31 DIAGNOSIS — K529 Noninfective gastroenteritis and colitis, unspecified: Secondary | ICD-10-CM | POA: Diagnosis present

## 2022-10-31 DIAGNOSIS — K859 Acute pancreatitis without necrosis or infection, unspecified: Principal | ICD-10-CM | POA: Diagnosis present

## 2022-10-31 DIAGNOSIS — F1990 Other psychoactive substance use, unspecified, uncomplicated: Secondary | ICD-10-CM | POA: Diagnosis present

## 2022-10-31 DIAGNOSIS — R109 Unspecified abdominal pain: Principal | ICD-10-CM

## 2022-10-31 DIAGNOSIS — R000 Tachycardia, unspecified: Secondary | ICD-10-CM | POA: Diagnosis present

## 2022-10-31 DIAGNOSIS — E1122 Type 2 diabetes mellitus with diabetic chronic kidney disease: Secondary | ICD-10-CM | POA: Diagnosis present

## 2022-10-31 DIAGNOSIS — I160 Hypertensive urgency: Secondary | ICD-10-CM

## 2022-10-31 DIAGNOSIS — K219 Gastro-esophageal reflux disease without esophagitis: Secondary | ICD-10-CM | POA: Diagnosis present

## 2022-10-31 DIAGNOSIS — G8929 Other chronic pain: Secondary | ICD-10-CM | POA: Diagnosis present

## 2022-10-31 DIAGNOSIS — K861 Other chronic pancreatitis: Secondary | ICD-10-CM | POA: Diagnosis present

## 2022-10-31 DIAGNOSIS — C9000 Multiple myeloma not having achieved remission: Secondary | ICD-10-CM | POA: Diagnosis present

## 2022-10-31 DIAGNOSIS — Z8249 Family history of ischemic heart disease and other diseases of the circulatory system: Secondary | ICD-10-CM | POA: Diagnosis not present

## 2022-10-31 DIAGNOSIS — Z79891 Long term (current) use of opiate analgesic: Secondary | ICD-10-CM

## 2022-10-31 DIAGNOSIS — I129 Hypertensive chronic kidney disease with stage 1 through stage 4 chronic kidney disease, or unspecified chronic kidney disease: Secondary | ICD-10-CM | POA: Diagnosis present

## 2022-10-31 DIAGNOSIS — Z803 Family history of malignant neoplasm of breast: Secondary | ICD-10-CM

## 2022-10-31 DIAGNOSIS — K863 Pseudocyst of pancreas: Secondary | ICD-10-CM | POA: Diagnosis present

## 2022-10-31 DIAGNOSIS — R112 Nausea with vomiting, unspecified: Secondary | ICD-10-CM

## 2022-10-31 DIAGNOSIS — I7100 Dissection of unspecified site of aorta: Secondary | ICD-10-CM

## 2022-10-31 DIAGNOSIS — I7103 Dissection of thoracoabdominal aorta: Secondary | ICD-10-CM | POA: Diagnosis present

## 2022-10-31 LAB — COMPREHENSIVE METABOLIC PANEL
ALT: 13 U/L (ref 0–44)
AST: 14 U/L — ABNORMAL LOW (ref 15–41)
Albumin: 3.9 g/dL (ref 3.5–5.0)
Alkaline Phosphatase: 55 U/L (ref 38–126)
Anion gap: 14 (ref 5–15)
BUN: 5 mg/dL — ABNORMAL LOW (ref 6–20)
CO2: 18 mmol/L — ABNORMAL LOW (ref 22–32)
Calcium: 9.4 mg/dL (ref 8.9–10.3)
Chloride: 105 mmol/L (ref 98–111)
Creatinine, Ser: 0.84 mg/dL (ref 0.44–1.00)
GFR, Estimated: >60 mL/min (ref >60)
Glucose, Bld: 101 mg/dL — ABNORMAL HIGH (ref 70–99)
Potassium: 3.6 mmol/L (ref 3.5–5.1)
Sodium: 137 mmol/L (ref 135–145)
Total Bilirubin: 1.5 mg/dL — ABNORMAL HIGH (ref 0.3–1.2)
Total Protein: 7.2 g/dL (ref 6.5–8.1)

## 2022-10-31 LAB — URINALYSIS, ROUTINE W REFLEX MICROSCOPIC
Bacteria, UA: NONE SEEN
Bilirubin Urine: NEGATIVE
Glucose, UA: NEGATIVE mg/dL
Hgb urine dipstick: NEGATIVE
Ketones, ur: 80 mg/dL — AB
Leukocytes,Ua: NEGATIVE
Nitrite: NEGATIVE
Protein, ur: 30 mg/dL — AB
Specific Gravity, Urine: 1.019 (ref 1.005–1.030)
pH: 6 (ref 5.0–8.0)

## 2022-10-31 LAB — CBC
HCT: 38.9 % (ref 36.0–46.0)
Hemoglobin: 12.9 g/dL (ref 12.0–15.0)
MCH: 30.9 pg (ref 26.0–34.0)
MCHC: 33.2 g/dL (ref 30.0–36.0)
MCV: 93.3 fL (ref 80.0–100.0)
Platelets: 243 10*3/uL (ref 150–400)
RBC: 4.17 MIL/uL (ref 3.87–5.11)
RDW: 14.2 % (ref 11.5–15.5)
WBC: 8 10*3/uL (ref 4.0–10.5)
nRBC: 0 % (ref 0.0–0.2)

## 2022-10-31 LAB — I-STAT BETA HCG BLOOD, ED (MC, WL, AP ONLY): I-stat hCG, quantitative: <5 m[IU]/mL (ref <5)

## 2022-10-31 LAB — GLUCOSE, CAPILLARY: Glucose-Capillary: 104 mg/dL — ABNORMAL HIGH (ref 70–99)

## 2022-10-31 LAB — MAGNESIUM: Magnesium: 1.7 mg/dL (ref 1.7–2.4)

## 2022-10-31 LAB — TROPONIN I (HIGH SENSITIVITY): Troponin I (High Sensitivity): 10 ng/L (ref <18)

## 2022-10-31 LAB — LIPASE, BLOOD: Lipase: 118 U/L — ABNORMAL HIGH (ref 11–51)

## 2022-10-31 MED ORDER — SODIUM CHLORIDE 0.9 % IV SOLN
8.0000 mg | Freq: Four times a day (QID) | INTRAVENOUS | Status: DC | PRN
  Filled 2022-10-31: qty 4

## 2022-10-31 MED ORDER — DIPHENHYDRAMINE HCL 50 MG/ML IJ SOLN
12.5000 mg | Freq: Once | INTRAMUSCULAR | Status: AC
  Administered 2022-10-31: 12.5 mg via INTRAVENOUS
  Filled 2022-10-31: qty 1

## 2022-10-31 MED ORDER — HYDRALAZINE HCL 20 MG/ML IJ SOLN: 2.0000 mg | Freq: Once | INTRAMUSCULAR | Status: DC

## 2022-10-31 MED ORDER — HYDROMORPHONE HCL 1 MG/ML IJ SOLN
1.0000 mg | Freq: Once | INTRAMUSCULAR | Status: AC
  Administered 2022-10-31: 1 mg via INTRAVENOUS
  Filled 2022-10-31: qty 1

## 2022-10-31 MED ORDER — HYDROCODONE-ACETAMINOPHEN 5-325 MG PO TABS: 2.0000 | ORAL_TABLET | Freq: Once | ORAL | Status: DC

## 2022-10-31 MED ORDER — IOHEXOL 350 MG/ML SOLN
100.0000 mL | Freq: Once | INTRAVENOUS | Status: AC | PRN
  Administered 2022-10-31: 100 mL via INTRAVENOUS

## 2022-10-31 MED ORDER — ESMOLOL HCL-SODIUM CHLORIDE 2000 MG/100ML IV SOLN
25.0000 ug/kg/min | INTRAVENOUS | Status: DC
  Administered 2022-10-31 (×2): 150 ug/kg/min via INTRAVENOUS
  Administered 2022-10-31: 50 ug/kg/min via INTRAVENOUS
  Administered 2022-11-01 (×4): 150 ug/kg/min via INTRAVENOUS
  Administered 2022-11-01: 125 ug/kg/min via INTRAVENOUS
  Filled 2022-10-31 (×10): qty 100

## 2022-10-31 MED ORDER — PROCHLORPERAZINE EDISYLATE 10 MG/2ML IJ SOLN
10.0000 mg | Freq: Four times a day (QID) | INTRAMUSCULAR | Status: DC | PRN
  Administered 2022-10-31 – 2022-11-01 (×2): 10 mg via INTRAVENOUS
  Filled 2022-10-31 (×3): qty 2

## 2022-10-31 MED ORDER — LABETALOL HCL 5 MG/ML IV SOLN
20.0000 mg | Freq: Once | INTRAVENOUS | Status: AC
  Administered 2022-10-31: 20 mg via INTRAVENOUS
  Filled 2022-10-31: qty 4

## 2022-10-31 MED ORDER — MAGNESIUM SULFATE 2 GM/50ML IV SOLN
2.0000 g | Freq: Once | INTRAVENOUS | Status: AC
  Administered 2022-10-31: 2 g via INTRAVENOUS
  Filled 2022-10-31: qty 50

## 2022-10-31 MED ORDER — ONDANSETRON HCL 4 MG/2ML IJ SOLN
4.0000 mg | Freq: Once | INTRAMUSCULAR | Status: AC
  Administered 2022-10-31: 4 mg via INTRAVENOUS
  Filled 2022-10-31: qty 2

## 2022-10-31 MED ORDER — LABETALOL HCL 5 MG/ML IV SOLN
10.0000 mg | Freq: Once | INTRAVENOUS | Status: AC
  Administered 2022-10-31: 10 mg via INTRAVENOUS
  Filled 2022-10-31: qty 4

## 2022-10-31 MED ORDER — NICOTINE 21 MG/24HR TD PT24
21.0000 mg | MEDICATED_PATCH | Freq: Every day | TRANSDERMAL | Status: DC
  Administered 2022-11-01 – 2022-11-02 (×2): 21 mg via TRANSDERMAL
  Filled 2022-10-31 (×3): qty 1

## 2022-10-31 MED ORDER — PANCRELIPASE (LIP-PROT-AMYL) 36000-114000 UNITS PO CPEP
36000.0000 [IU] | ORAL_CAPSULE | Freq: Three times a day (TID) | ORAL | Status: DC
  Administered 2022-11-01 – 2022-11-02 (×5): 36000 [IU] via ORAL
  Filled 2022-10-31 (×8): qty 1

## 2022-10-31 MED ORDER — HYDROMORPHONE HCL 1 MG/ML IJ SOLN: 1.0000 mg | INTRAMUSCULAR | Status: DC | PRN

## 2022-10-31 MED ORDER — PANTOPRAZOLE SODIUM 40 MG IV SOLR
40.0000 mg | Freq: Once | INTRAVENOUS | Status: AC
  Administered 2022-10-31: 40 mg via INTRAVENOUS
  Filled 2022-10-31: qty 10

## 2022-10-31 MED ORDER — OXYCODONE-ACETAMINOPHEN 5-325 MG PO TABS: 1.0000 | ORAL_TABLET | Freq: Four times a day (QID) | ORAL | 0 refills | Status: DC | PRN

## 2022-10-31 MED ORDER — LACTATED RINGERS IV BOLUS
1000.0000 mL | Freq: Once | INTRAVENOUS | Status: AC
  Administered 2022-10-31: 1000 mL via INTRAVENOUS

## 2022-10-31 MED ORDER — HYDROMORPHONE HCL 1 MG/ML IJ SOLN
0.5000 mg | INTRAMUSCULAR | Status: DC | PRN
  Administered 2022-10-31: 2 mg via INTRAVENOUS
  Administered 2022-11-01 – 2022-11-03 (×15): 1 mg via INTRAVENOUS
  Filled 2022-10-31 (×4): qty 1
  Filled 2022-10-31: qty 2
  Filled 2022-10-31 (×11): qty 1

## 2022-10-31 MED ORDER — CHLORHEXIDINE GLUCONATE CLOTH 2 % EX PADS
6.0000 | MEDICATED_PAD | Freq: Every day | CUTANEOUS | Status: DC
  Administered 2022-10-31 – 2022-11-03 (×4): 6 via TOPICAL

## 2022-10-31 MED ORDER — NICARDIPINE HCL IN NACL 20-0.86 MG/200ML-% IV SOLN
3.0000 mg/h | INTRAVENOUS | Status: DC
  Administered 2022-10-31 (×2): 5 mg/h via INTRAVENOUS
  Administered 2022-11-01: 1.5 mg/h via INTRAVENOUS
  Filled 2022-10-31 (×4): qty 200

## 2022-10-31 NOTE — H&P (Signed)
  History and Physical    Patient: Robin Guerrero NKN:[id number] DOB: December 03, 1986 DOA: 10/30/2022  Patient left AMA

## 2022-10-31 NOTE — ED Notes (Signed)
Pts bp 189/144 pulse 136 reported to triage nurse

## 2022-10-31 NOTE — H&P (Signed)
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  Most recent pancreatitis April 2023.  Complicated by portal hypertension due to portal vein and splenic vein narrowing.  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  She is also had EGD September 11, 2022 for celiac plexus block for pain management.  She is also had perihepatic pancreatic pseudocyst.  Most recently started on gabapentin September 04, 2022 for pain management.  Most recently she presented 10/28/2022 to the emergency department at [facility] long with nausea vomiting and some hematemesis intermittently since May 2023.  CT showed chronic splenic vein occlusion with stability of dissection and possible acute on chronic pancreatitis.  Ultrasound did not show any evidence of cholecystitis.  Discharged home on Reglan.  Presented again 10/30/2022 with some episode of hematochezia and ongoing abdominal pain and nausea vomiting.  Given multiple rounds of droperidol Zofran and Dilaudid.  Tachycardic 120.  Signed out AGAINST MEDICAL ADVICE 10/31/2022 early hours due to childcare issues but represented later in the morning with same symptoms.  Blood pressure elevated because she has been unable to take antihypertensives due to abdominal issues.  CT chest again showed stable aortic dissection type B.  Conversation with cardiothoracic surgery recommended systolic blood pressure goal between 110 and 120 and heart rate less  than 60 systolic blood pressure 557/322 pulse of 136.  Started on both Cardene and esmolol and CCM consulted for admission.   Urine tox positive for opioids and tetrahydrocannabinol 10/30/2022.  Last alcoholic drink several years ago.  Ongoing tobacco use present.  Patient requesting transfer post admission to [facility] to be with the hepatology team  Past Medical History:    has a past medical history of Descending thoracic dissection ([place]), History of anemia, Hypertension, and Pancreatitis.   reports that she has been smoking cigarettes. She has a 0.60 pack-year smoking history. She has never used smokeless tobacco.  Past Surgical History:  Procedure Laterality Date   BIOPSY  09/11/2022   Procedure: BIOPSY;  Surgeon: Rush Landmark Telford Nab., MD;  Location: [facility];  Service: Gastroenterology;;   ESOPHAGOGASTRODUODENOSCOPY N/A 06/06/2022   Procedure: ESOPHAGOGASTRODUODENOSCOPY (EGD);  Surgeon: Arta Silence, MD;  Location: [facility];  Service: Gastroenterology;  Laterality: N/A;   ESOPHAGOGASTRODUODENOSCOPY N/A 06/14/2022   Procedure: ESOPHAGOGASTRODUODENOSCOPY (EGD);  Surgeon: [name], MD;  Location: [facility];  Service: Gastroenterology;  Laterality: N/A;   ESOPHAGOGASTRODUODENOSCOPY (EGD) WITH PROPOFOL N/A 08/01/2022   Procedure: ESOPHAGOGASTRODUODENOSCOPY (EGD) WITH PROPOFOL;  Surgeon: Clarene Essex, MD;  Location: [facility];  Service: Gastroenterology;  Laterality: N/A;   ESOPHAGOGASTRODUODENOSCOPY (EGD) WITH PROPOFOL N/A 09/11/2022   Procedure: ESOPHAGOGASTRODUODENOSCOPY (EGD) WITH PROPOFOL;  Surgeon: Rush Landmark Telford Nab., MD;  Location: [facility];  Service: Gastroenterology;  Laterality: N/A;   EUS N/A 09/11/2022   Procedure: UPPER ENDOSCOPIC ULTRASOUND (EUS) RADIAL;  Surgeon: Irving Copas., MD;  Location: [facility];  Service: Gastroenterology;  Laterality: N/A;   FOREIGN BODY REMOVAL Left 02/10/2015   Procedure: ATTEMPTED EXPLANTATION OF  BIRTH CONTROL  DEVICE LEFT ARM;  Surgeon: Johnathan Hausen, MD;  Location: [place];  Service: General;  Laterality: Left;   FOREIGN BODY REMOVAL Left 05/12/2015   Procedure: REMOVAL OF LEFT ARM BIRTH CONTROL DEVICE;  Surgeon: Johnathan Hausen, MD;  Location: [place];  Service: General;  Laterality: Left;   NEUROLYTIC CELIAC PLEXUS N/A 09/11/2022   Procedure: NEUROLYTIC CELIAC PLEXUS;  Surgeon: Irving Copas., MD;  Location: [facility];  Service: Gastroenterology;  Laterality: N/A;   SUBMANDIBULAR GLAND EXCISION Right 05/01/2009    Allergies  Allergen Reactions   Ativan [Lorazepam] Other (See Comments)    Disorientated, combative   Tape Rash    Clear adhesive tape    There is no immunization history for the selected administration types on file for this patient.  Family History  Problem Relation Age of Onset   Breast cancer Mother 25   Heart disease Mother    Hypertension Mother    Colon cancer Neg Hx    Esophageal cancer Neg Hx    Inflammatory bowel disease Neg Hx    Liver disease Neg Hx    Pancreatic cancer Neg Hx    Rectal cancer Neg Hx    Stomach cancer Neg Hx      Current Facility-Administered Medications:    esmolol (BREVIBLOC) 2000 mg / 100 mL (20 mg/mL) infusion, 25-300 mcg/kg/min, Intravenous, Continuous, Branham, Valda Lamb, MD, Last Rate: 35.7 mL/hr at 10/31/22 2016, 150 mcg/kg/min at 10/31/22 2016   HYDROmorphone (DILAUDID) injection 1 mg, 1 mg, Intravenous, Once, Georgina Snell C, MD   nicardipine (CARDENE) '20mg'$  in 0.86% saline 266m IV infusion (0.1 mg/ml), 3-15 mg/hr, Intravenous, Continuous, BElgie Congo MD, Last Rate: 50 mL/hr at 10/31/22 2015, 5 mg/hr at 10/31/22 2015  Current Outpatient Medications:    acetaminophen (TYLENOL) 500 MG tablet, Take 1,000 mg by mouth every 6 (six) hours as needed for moderate pain., Disp: , Rfl:    amLODipine (NORVASC) 10 MG tablet, Take 1 tablet (10 mg total) by mouth daily.,  Disp: 90 tablet, Rfl: 3   cloNIDine (CATAPRES) 0.2 MG tablet, Take 1 tablet (0.2 mg total) by mouth every 8 (eight) hours., Disp: 270 tablet, Rfl: 3   hydrALAZINE (APRESOLINE) 100 MG tablet, TAKE 1 TABLET BY MOUTH 3 TIMES DAILY., Disp: 270 tablet, Rfl: 1   losartan (COZAAR) 100 MG tablet, Take 1 tablet (100 mg total) by mouth daily., Disp: 30 tablet, Rfl: 2   metoCLOPramide (REGLAN) 10 MG tablet, Take 1 tablet (10 mg total) by mouth every 6 (six) hours., Disp: 15 tablet, Rfl: 0   Multiple Vitamins-Minerals (MULTIVITAMIN WITH MINERALS) tablet, Take 1 tablet by mouth daily., Disp: , Rfl:    ondansetron (ZOFRAN) 4 MG tablet, Take 1 tablet (4 mg total) by mouth every 8 (eight) hours as needed for nausea or vomiting., Disp: 30 tablet, Rfl: 0   Pancrelipase, Lip-Prot-Amyl, (ZENPEP) 40000-126000 units CPEP, Take 1-2 capsules by mouth See admin instructions. 2 tablets with each meal and 1 tablet with snacks, Disp: , Rfl:    pantoprazole (PROTONIX) 40 MG tablet, Take 40 mg by mouth 2 (two) times daily before a meal., Disp: , Rfl:    propranolol (INDERAL) 10 MG tablet, Take 1 tablet (10 mg total) by mouth 2 (two) times daily., Disp: 60 tablet, Rfl: 2   vitamin B-12 1000 MCG tablet, Take 1 tablet (1,000 mcg total) by mouth daily., Disp: 30 tablet, Rfl: 0   Bismuth/Metronidaz/Tetracyclin 140-125-125 MG CAPS, Take 3 capsules by mouth 4 (  four) times daily. (Patient not taking: Reported on 10/31/2022), Disp: , Rfl:    oxyCODONE (ROXICODONE) 5 MG immediate release tablet, Take 1 tablet (5 mg total) by mouth every 8 (eight) hours as needed. (Patient not taking: Reported on 10/30/2022), Disp: 15 tablet, Rfl: 0   oxyCODONE-acetaminophen (PERCOCET/ROXICET) 5-325 MG tablet, Take 1 tablet by mouth every 6 (six) hours as needed for severe pain., Disp: 10 tablet, Rfl: 0   polyethylene glycol (MIRALAX / GLYCOLAX) 17 g packet, Take 17 g by mouth daily. (Patient not taking: Reported on 10/31/2022), Disp: 14 each, Rfl: 0    promethazine (PHENERGAN) 12.5 MG tablet, Take 1 tablet (12.5 mg total) by mouth every 8 (eight) hours as needed for nausea or vomiting. (Patient not taking: Reported on 10/30/2022), Disp: 30 tablet, Rfl: 2     Significant Hospital Events:  10/31/2022 - admit   Interim History / Subjective:   10/31/2022 - seen in [facility] 033  Objective   Blood pressure (!) 162/122, pulse (!) 108, temperature 98.1 F (36.7 C), resp. rate 16, height '5\' 8"'$  (1.727 m), weight 79.4 kg, last menstrual period 09/29/2022, SpO2 100 %.        Intake/Output Summary (Last 24 hours) at 10/31/2022 2113 Last data filed at 10/31/2022 2015 Gross per 24 hour  Intake 1000.31 ml  Output --  Net 1000.31 ml   Filed Weights   10/31/22 1051  Weight: 79.4 kg    Examination: General: Pleasant female.  Sitting in the bed in the emergency room stretcher. HENT: No neck nodes no elevated JVP. Lungs: Clear to auscultation bilaterally no respiratory distress Cardiovascular: Normal heart sounds.  Tachycardic sinus.  Blood pressure 353 systolic on esmolol and Cardene Abdomen: Soft nonspecific tenderness.  No rebound or rigidity Extremities: Intact Neuro: Alert and oriented x3 GU: Not examined  Resolved Hospital Problem list   x  Assessment & Plan:   Severe chronic abdominal pain from chronic pancreatitis with possible acute flareup -  status post celiac block in 2023.   - Trial of gabapentin since summer 2023 through [facility] -On chronic opioids at home   P:   Opioids as needed Pancrelipase Get UDS Check serum lead (NOT avail in orders)      Hypertension -on amlodipine, Catapres, hydralazine, losartan, propranolol at home Chronic type B aortic dissection   - 10/31/2022: Severe hypertension without any change in type B aortic dissection after inability to take p.o. 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  Pancreatitis flare     P:   Nausea and vomiting control - zofran prn -> if needed reglan/compazine with QTc monitrong Abdominal pain control NPO but if needed can do Clear liquid diet Might need GI consult (she is requesting [facility] transfer]     At risk for hypo-/hyperglycemia  Plan SSI  Possible substance abuse -Ongoing cigarette smoker -Urine tox positive for marijuana 10/30/2022  Plan -Nicotine patch - Urine drug screen -   Best practice (daily eval):  Diet:  npo but if she insistns clears ok Pain/Anxiety/Delirium protocol (if indicated): dilaudid prn VAP protocol (if indicated): x DVT prophylaxis: SCDs (hx of hmetochezia through Hgb normal) GI prophylaxis: ppi Glucose control: ssid Mobility: bed rest w Code Status: full Family Communication: patient Disposition: ER -> ICU -> 11/01/22 AM Family Updates: no family at bedside     [place]   The patient Diasia Coke is critically ill with multiple organ systems failure and requires high complexity decision making for assessment and support, frequent evaluation and titration of therapies, application of advanced monitoring technologies and extensive interpretation of multiple databases.   Critical Care Time devoted to patient care services described in this note is  75  Minutes. This time reflects time of care of this signee Dr Brand Males. This critical care time does not reflect procedure time, or teaching time or  supervisory time of PA/NP/Med student/Med Resident etc but could involve care discussion time      SIGNATURE    Dr. Brand Males, M.D., F.C.C.P,  Pulmonary and Critical Care Medicine Staff Physician, [place] Director - Interstitial Lung Disease  Program  Pulmonary [place] [facility] [place], [place], [address]  NPI Number:  NPI #[id number]  Pager: [phone], If no answer  -> Check AMION or Try [phone] Telephone (clinical office): [phone] Telephone (research): [phone]  9:13 PM 10/31/2022   10/31/2022 9:13 PM    LABS    Latest Reference Range & Units 06/06/21 02:19 08/01/22 12:39  Amylase, Serum 28 - 100 U/L 371 (H) 295 (H)  (H): Data is abnormally high  Latest Reference Range & Units 08/18/20 07:02 08/19/20 06:09 08/20/20 10:08 08/21/20 05:41 08/23/20 04:07 08/25/20 03:30 11/07/20 22:40 11/09/20 17:33 04/26/21 11:13 06/04/21 00:16 06/05/21 05:28 04/08/22 13:09 04/22/22 14:23 04/23/22 05:07 06/03/22 16:15 06/11/22 00:25 07/30/22 17:57 07/31/22 06:38 08/01/22 12:39 10/28/22 14:23 10/30/22 17:55 10/31/22 15:24  Lipase 11 - 51 U/L 3,901 (H) 1,178 (H) 219 (H) 107 (H) 92 (H) 109 (H) 28 125 (H) 72 (H) 636 (H) 398 (H) 166 (H) 112 (H) 69 (H) 75 (H) 328 (H) 139 (H) 193 (H) 139 (H) 98 (H) 89 (H) 118 (H)  (H): Data is abnormally high PULMONARY No results for input(s): "PHART", "PCO2ART", "PO2ART", "HCO3", "TCO2", "O2SAT" in the last 168 hours.  Invalid input(s): "PCO2", "PO2"  CBC Recent Labs  Lab 10/28/22 1423 10/30/22 1755 10/31/22 1524  HGB 12.5 13.8 12.9  HCT 39.4 40.2 38.9  WBC 7.2 8.3 8.0  PLT 279 279 243    COAGULATION No results for input(s): "INR" in the last 168 hours.  CARDIAC  No results for input(s): "TROPONINI" in the last 168 hours. No results for input(s): "PROBNP" in the last 168 hours.  CHEMISTRY Recent Labs  Lab 10/28/22 1423 10/30/22 1755 10/31/22 1524 10/31/22 1835   NA 139 139 137  --   K 3.5 3.7 3.6  --   CL 108 105 105  --   CO2 22 19* 18*  --   GLUCOSE 89 85 101*  --   BUN 12 <5* 5*  --   CREATININE 0.73 0.72 0.84  --   CALCIUM 9.1 9.7 9.4  --   MG  --   --   --  1.7   Estimated Creatinine Clearance: 102.5 mL/min (by C-G formula based on SCr of 0.84 mg/dL).   LIVER Recent Labs  Lab 10/28/22 1423 10/30/22 1755 10/31/22 1524  AST 13* 14* 14*  ALT '14 13 13  '$ ALKPHOS 50 55 55  BILITOT 1.2 1.4* 1.5*  PROT 7.5 8.0 7.2  ALBUMIN 3.9 4.0 3.9     INFECTIOUS No results for input(s): "LATICACIDVEN", "PROCALCITON" in the last 168 hours.   ENDOCRINE CBG (last 3)  No results for input(s): "GLUCAP" in the last 72 hours.       IMAGING x48h  - image(s) personally visualized  -   highlighted in bold CT Angio Chest/Abd/Pel for Dissection W and/or Wo Contrast  Result Date: 10/31/2022 CLINICAL DATA:  Generalized abdominal pain greatest on right, chronic pancreatitis, nausea EXAM: CT ANGIOGRAPHY CHEST, ABDOMEN AND PELVIS TECHNIQUE: Non-contrast CT of the chest was initially obtained. Multidetector CT imaging through the chest, abdomen and pelvis was performed using the standard protocol during bolus administration of intravenous contrast. Multiplanar reconstructed images and MIPs were obtained and reviewed to evaluate the vascular anatomy. RADIATION DOSE REDUCTION: This exam was performed according to the departmental dose-optimization program which includes automated exposure control, adjustment of the mA and/or kV according to patient size and/or use of iterative reconstruction technique. CONTRAST:  157m OMNIPAQUE IOHEXOL 350 MG/ML SOLN COMPARISON:  10/30/2022, 10/28/2022, 07/04/2022 FINDINGS: CTA CHEST FINDINGS Cardiovascular: Stable type B thoracic aortic dissection beginning distal to the origin of the left subclavian artery. Maximal diameter of the proximal descending thoracic aorta at the level of dissection measures 4.7 cm, unchanged by my  measurements since prior study. No evidence of intramural hematoma, leak, or rupture. The dissection extends through the abdominal aorta into the right common iliac artery, unchanged since the previous exam. Two vessel aortic arch again noted with common origin of the left common carotid and innominate artery. Great vessels are widely patent. The heart is unremarkable without pericardial effusion. There is adequate opacification of the pulmonary vasculature, with no filling defects or pulmonary emboli identified. Mediastinum/Nodes: No enlarged mediastinal or hilar lymph nodes. There is enlarged right axillary lymph node measuring 1.5 x 2.6 cm reference image 23/5. No other axillary adenopathy. Thyroid gland, trachea, and esophagus demonstrate no significant findings. Lungs/Pleura: No acute airspace disease, effusion, or pneumothorax. The central airways are widely patent. Musculoskeletal: No acute or destructive bony lesions. Reconstructed images demonstrate no additional findings. Review of the MIP images confirms the above findings. CTA ABDOMEN AND PELVIS FINDINGS VASCULAR Aorta: The type B thoracic aortic dissection extends the abdominal aorta, terminating in the right common iliac bifurcation. 3.5 cm abdominal aortic aneurysm at the level of the diaphragmatic hiatus unchanged. No evidence of aortic rupture. No critical stenosis. Celiac: The celiac artery is widely patent and arises from the true lumen. The dissection does not extend into the celiac artery. A 1 cm splenic artery aneurysm is identified on image 128/5, unchanged since the 10/28/2022 exam. No critical stenosis. SMA: The SMA arises from the true lumen. Dissection does not involve the SMA. No aneurysm or critical stenosis. 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 There is a fenestration within the dissection at the level of the renal arteries. The left renal artery is supplied by the true lumen. No critical  stenosis, aneurysm, or vasculitis. IMA: The IMA arises from the true lumen and is patent. No critical stenosis. Inflow: Dissection extends into the right common iliac artery to the level the bifurcation. No critical stenosis within either iliac artery. Veins: Numerous venous collaterals left upper quadrant compatible with the known narrowing of the splenic and portal veins, please see discussion on CT  exam yesterday. Review of the MIP images confirms the above findings. NON-VASCULAR Hepatobiliary: No focal liver abnormality is seen. No gallstones, gallbladder wall thickening, or biliary dilatation. Pancreas: Inflammatory changes are again seen surrounding the pancreas, more pronounced on the exam performed yesterday, consistent with worsening acute pancreatitis. There is decreased enhancement of the pancreatic parenchyma within the head and body consistent with severe pancreatitis. Multiple cystic areas within the pancreatic head, as well as irregular cystic dilatation of the pancreatic duct are stable. Spleen: Normal in size without focal abnormality. Adrenals/Urinary Tract: Adrenal glands are unremarkable. Kidneys are normal, without renal calculi, focal lesion, or hydronephrosis. Bladder is unremarkable. Stomach/Bowel: No bowel obstruction or ileus. Normal appendix right lower quadrant. No bowel wall thickening or inflammatory change. Lymphatic: No pathologic adenopathy within the abdomen or pelvis. Subcentimeter lymph nodes surrounding the pancreas are likely reactive. Reproductive: Stable uterine fibroid.  No adnexal masses. Other: Trace free fluid within the pelvis. No free intraperitoneal gas. No abdominal wall hernia. Musculoskeletal: No acute or destructive bony lesions. Reconstructed images demonstrate no additional findings. Review of the MIP images confirms the above findings. IMPRESSION: Vascular: 1. Stable type B thoracoabdominal aortic dissection, extending from the distal aortic arch through the  right common iliac artery bifurcation. There has been no change in the size, extent, or configuration of the dissection since prior exam 07/04/2022. 2. 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 This was discussed on CT report yesterday. 3. Stable 1 cm splenic artery aneurysm. 4. Stable 3.5 cm abdominal aortic aneurysm. Recommend follow-up every 2 years. Nonvascular: 1. Interval decreased enhancement of the pancreatic parenchyma within the head and body, with progressive peripancreatic fat stranding, consistent with progressive severe acute pancreatitis. Multiple cystic areas within the pancreatic parenchyma and cystic dilatation of the pancreatic duct are unchanged since previous exams. 2. No evidence of pulmonary embolus. 3. Trace pelvic free fluid, likely reactive. Electronically Signed   By: Randa Ngo M.D.   On: 10/31/2022 19:48   CT ABDOMEN PELVIS W CONTRAST  Result Date: 10/30/2022 CLINICAL DATA:  Abdominal pain, blood in stool, known thoracic aortic dissection EXAM: CT ABDOMEN AND PELVIS WITH CONTRAST TECHNIQUE: Multidetector CT imaging of the abdomen and pelvis was performed using the standard protocol following bolus administration of intravenous contrast. RADIATION DOSE REDUCTION: This exam was performed according to the departmental dose-optimization program which includes automated exposure control, adjustment of the mA and/or kV according to patient size and/or use of iterative reconstruction technique. CONTRAST:  63m OMNIPAQUE IOHEXOL 350 MG/ML SOLN COMPARISON:  10/28/2022, 07/30/2022 FINDINGS: Lower chest: No acute pleural or parenchymal lung disease. Hepatobiliary: No focal liver abnormality is seen. No gallstones, gallbladder wall thickening, or biliary dilatation. Pancreas: Inflammatory changes are seen surrounding the pancreas, with interstitial edema again seen within the pancreatic head and body.  Findings are consistent with acute pancreatitis. Stable multiple small pancreatic cysts and cystic dilatation of the pancreatic duct. No evidence of abscess. Spleen: Normal in size without focal abnormality. Adrenals/Urinary Tract: Adrenal glands are unremarkable. Kidneys are normal, without renal calculi, focal lesion, or hydronephrosis. Bladder is unremarkable. Stomach/Bowel: No bowel obstruction or ileus. Normal appendix right lower quadrant. No bowel wall thickening or inflammatory change. Vascular/Lymphatic: Stable dissection of the descending thoracic aorta and abdominal aorta, extending into the right common iliac artery. 3.5 cm proximal abdominal aortic aneurysm at the level of the diaphragmatic hiatus. No change since prior exam. Numerous venous collaterals are seen within the left upper  quadrant likely related to extrinsic narrowing of the splenic vein and porta splenic confluence due to inflammatory changes from pancreatitis. No pathologic adenopathy. Reproductive: Exophytic fibroid off the lower uterine segment on the right. Stable appearance of the ovaries. Other: Trace pelvic free fluid. No free intraperitoneal gas. No abdominal wall hernia. Musculoskeletal: No acute or destructive bony lesions. Reconstructed images demonstrate no additional findings. IMPRESSION: 1. Stable acute pancreatitis. Numerous cystic lesions within the pancreas and irregular dilatation of the pancreatic duct are stable as well. 2. Stable type B chronic aortic dissection, extending from the descending thoracic aorta through the right common iliac artery. 3. 3.5 cm abdominal aortic aneurysm. Recommend follow-up every 2 years. Reference: J Am Coll Radiol 4128;78:676-720. 4. 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 Numerous venous collaterals within the left quadrant. Electronically Signed   By: Randa Ngo M.D.   On: 10/30/2022 22:56

## 2022-10-31 NOTE — Progress Notes (Signed)
[facility] Progress Note Patient Name: Robin Guerrero DOB: [birth year] MRN: [id number]   Date of Service  10/31/2022  HPI/Events of Note  Multiple issues: 1. Nausea - QTc interval = 0.48 seconds. BP = 143/45 with MAP = 71 and Sat = 98%.  eICU Interventions  Plan: Compazine 10 mg IV Q 6 hours PRN N/V. Dilaudid 1 mg IV Q 2 hours PRN moderate or severe pain.      Intervention Category Major Interventions: Other:  Lysle Dingwall 10/31/2022, 10:35 PM

## 2022-10-31 NOTE — ED Provider Triage Note (Signed)
Emergency Medicine Provider Triage Evaluation Note  Avryl Roehm , a 36 y.o. female  was evaluated in triage.  Pt complains of abdominal pain, nausea, anxiety. Was here yesterday and was to be admitted for alcoholic pancreatitis for pain control, had to leave to set up child care. Back again today with same symptoms.   Review of Systems  Positive: Abdominal pain, anxiety, nausea Negative: Vomiting, diarrhea  Physical Exam  BP (!) 159/66 (BP Location: Left Arm)   Pulse (!) 121   Temp 98.5 F (36.9 C)   Resp 20   Ht '5\' 8"'$  (1.727 m)   Wt 79.4 kg   LMP 09/29/2022   SpO2 100%   BMI 26.61 kg/m  Gen:   Awake, no distress   Resp:  Normal effort  MSK:   Moves extremities without difficulty  Other:    Medical Decision Making  Medically screening exam initiated at 10:56 AM.  Appropriate orders placed.  Dericka Buist was informed that the remainder of the evaluation will be completed by another provider, this initial triage assessment does not replace that evaluation, and the importance of remaining in the ED until their evaluation is complete.     Alantis Bethune T, PA-C 10/31/22 1057

## 2022-10-31 NOTE — ED Notes (Addendum)
Rn reviewed risks of leaving AMA with pt. Pt verbalized understanding. Pt ambulated to lobby with steady gait, respirations even and unlabored. VSS upon departure

## 2022-10-31 NOTE — ED Triage Notes (Signed)
Patient c/o abdominal pain generalized but primarily on the right side. Patient states she has chronic pancreatitis and had a panic attack yesterday. States she was seen here yesterday and was supposed to be admitted however was unable to stay due to childcare. C/o nausea but no emesis since yesterday.

## 2022-10-31 NOTE — ED Notes (Signed)
Pt transported to scans.

## 2022-10-31 NOTE — ED Provider Notes (Signed)
This patient was admitted by preceding ED provider team and I did not receive any signout on this patient nor did I participate in her care leading up to disposition decision for admission.  I was called by hospitalist Dr. Claria Dice after departure of admitting ED PA who is the patient's primary EDP throughout her stay and was informed that the patient is not willing to be admitted to the hospital at this time.  Case was discussed with both hospitalist and EDP Dr. Maudie Mercury who is also on the patient's case and decision was made that patient should be signed out [place] should she refuse admission at this time.  Extensive discussion with the patient regarding her presenting tachycardia in the 120s and concern for safety of discharge at this time.  She voiced understanding of this concern and continues to express wishes to be discharged at this time.  She voiced understanding of her assumption of risk that her condition may worsen and become life-threatening since she is leaving AMA.  Will discharge patient at her request at this time.  This chart was dictated using voice recognition software, Dragon. Despite the best efforts of this provider to proofread and correct errors, errors may still occur which can change documentation meaning.    Emeline Darling, PA-C 10/31/22 0120    Cristie Hem, MD 11/05/22 1059

## 2022-10-31 NOTE — Discharge Instructions (Signed)
You have chosen to leave the hospital against medical advice. It was recommended you stay in the hospitals due to your rapid heart rate, in addition to your pancreatitis. You have voiced your understanding that your condition could worsen and become life-threatening at home, and still desire discharge at this time. Please follow up closely with your PCP and return to the ER with any new severe symptoms.

## 2022-10-31 NOTE — ED Provider Notes (Signed)
[facility] EMERGENCY DEPARTMENT Provider Note   CSN: [id number] Arrival date & time: 10/31/22  1002     History Chief Complaint  Patient presents with   Abdominal Pain    Robin Guerrero is a 36 y.o. 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 Patient was found to have acute on chronic pancreatitis left AMA prior to being admitted for pain control. She is presenting today with worsening nausea, vomiting, and abdominal and back pain  Patient reports constant right sided abdominal pain since Saturday, associated with nausea, vomiting, poor PO intake, fast heart beat. Patient denies known triggers to acute pain.  She lives with a constant pain of 4-5/10 but the pain she describes has been 10/10 since Saturday. Pain does not radiate; however, she is endorsing a worsening back pain in her midthoracic back, also 10/10 that feels like is tearing through the midback. Patient has chronic diarrhea, with no changes in her current BM frequency.   Patient denies shortness of breath or pain with deep breathing. She also denies chest pain, but does describe chest tightness. Patient endorsed speckles of blood in sputum and blood when wiping per rectum x1 yesterday but has since stopped.    Of note, patient is currently smoking tobacco products, but denies alcohol intake since many year ago. Patient has not been able to take her blood pressure medications since Saturday because nausea.     Abdominal Pain Associated symptoms: diarrhea, nausea and vomiting   Associated symptoms: no chest pain        Home Medications Prior to Admission medications   Medication Sig Start Date End Date Taking? Authorizing Provider  acetaminophen (TYLENOL) 500 MG tablet Take 1,000 mg by mouth every 6 (six) hours as needed for  moderate pain.   Yes [provider]  amLODipine (NORVASC) 10 MG tablet Take 1 tablet (10 mg total) by mouth daily. 03/08/22  Yes Patwardhan, Manish J, MD  cloNIDine (CATAPRES) 0.2 MG tablet Take 1 tablet (0.2 mg total) by mouth every 8 (eight) hours. 03/08/22 06/12/23 Yes Patwardhan, Manish J, MD  hydrALAZINE (APRESOLINE) 100 MG tablet TAKE 1 TABLET BY MOUTH 3 TIMES DAILY. 08/20/22  Yes Patwardhan, Manish J, MD  losartan (COZAAR) 100 MG tablet Take 1 tablet (100 mg total) by mouth daily. 06/08/22 10/31/22 Yes Ghimire, Dante Gang, MD  metoCLOPramide (REGLAN) 10 MG tablet Take 1 tablet (10 mg total) by mouth every 6 (six) hours. 10/28/22  Yes Fransico Meadow, MD  Multiple Vitamins-Minerals (MULTIVITAMIN WITH MINERALS) tablet Take 1 tablet by mouth daily.   Yes [provider]  ondansetron (ZOFRAN) 4 MG tablet Take 1 tablet (4 mg total) by mouth every 8 (eight) hours as needed for nausea or vomiting. 06/08/22  Yes Ghimire, Dante Gang, MD  Pancrelipase, Lip-Prot-Amyl, (ZENPEP) 40000-126000 units CPEP Take 1-2 capsules by mouth See admin instructions. 2 tablets with each meal and 1 tablet with snacks   Yes [provider]  pantoprazole (PROTONIX) 40 MG tablet Take 40 mg by mouth 2 (two) times daily before a meal.   Yes [provider]  propranolol (INDERAL) 10 MG tablet Take 1 tablet (10 mg total) by mouth 2 (two) times daily. 06/08/22 10/31/22 Yes Barb Merino, MD  vitamin B-12 1000 MCG tablet Take 1 tablet (1,000 mcg total) by mouth daily. 06/15/22  Yes Regalado, Cassie Freer, MD  Bismuth/Metronidaz/Tetracyclin 140-125-125 MG CAPS Take 3 capsules by mouth 4 (four) times daily. Patient not taking: Reported on 10/31/2022 09/26/22   [provider]  oxyCODONE (ROXICODONE) 5 MG immediate release tablet Take 1 tablet (5 mg total) by mouth every 8 (eight) hours as needed. Patient not taking: Reported on 10/30/2022 09/11/22 09/11/23  Mansouraty, Telford Nab., MD  oxyCODONE-acetaminophen  (PERCOCET/ROXICET) 5-325 MG tablet Take 1 tablet by mouth every 6 (six) hours as needed for severe pain. 10/31/22   Sponseller, Eugene Garnet R, PA-C  polyethylene glycol (MIRALAX / GLYCOLAX) 17 g packet Take 17 g by mouth daily. Patient not taking: Reported on 10/31/2022 06/15/22   Regalado, Jerald Kief A, MD  promethazine (PHENERGAN) 12.5 MG tablet Take 1 tablet (12.5 mg total) by mouth every 8 (eight) hours as needed for nausea or vomiting. Patient not taking: Reported on 10/30/2022 07/05/22   Mansouraty, Telford Nab., MD      Allergies    Ativan [lorazepam] and Tape    Review of Systems   Review of Systems  Constitutional: Negative.   HENT: Negative.    Respiratory: Negative.    Cardiovascular:  Positive for palpitations. Negative for chest pain and leg swelling.  Gastrointestinal:  Positive for abdominal pain, diarrhea, nausea and vomiting.  Genitourinary: Negative.   Musculoskeletal:  Positive for back pain. Negative for neck stiffness.  Skin: Negative.   Neurological:  Negative for syncope, weakness and headaches.  Psychiatric/Behavioral:  The patient is nervous/anxious.     Physical Exam Updated Vital Signs BP 120/83   Pulse (!) 106   Temp (!) 97.5 F (36.4 C) (Oral)   Resp 15   Ht _0  (1.727 m)   Wt 79.4 kg   LMP 09/29/2022   SpO2 99%   BMI 26.61 kg/m  Physical Exam Constitutional:      Appearance: She is well-developed.  HENT:     Head: Normocephalic and atraumatic.  Cardiovascular:     Rate and Rhythm: Regular rhythm. Tachycardia present.     Heart sounds: Normal heart sounds.     Comments: Palpable Radial and DP pulses 1+ bilaterally and symmetrical Pulmonary:     Effort: Pulmonary effort is normal. No respiratory distress.     Breath sounds: Normal breath sounds. No wheezing or rales.  Chest:     Chest wall: No tenderness.  Abdominal:     General: Bowel sounds are increased. There is no distension or abdominal bruit. There are no signs of injury.     Palpations:  There is no fluid wave.     Tenderness: There is abdominal tenderness in the epigastric area and left upper quadrant. There is guarding. There is no right CVA tenderness, left CVA tenderness or rebound. Negative signs include Murphy's sign and McBurney's sign.  Musculoskeletal:     Comments: No palpable tenderness on paraspinal thoracic muscles.   Skin:    General: Skin is warm and dry.     Coloration: Skin is not jaundiced.     Findings: No bruising, erythema or rash.  Neurological:     General: No focal deficit present.     Mental Status: She is alert and oriented to person, place, and time.  Psychiatric:        Mood and Affect: Mood normal.     ED Results / Procedures / Treatments   Labs (all labs ordered are listed, but only abnormal results are displayed) Labs Reviewed  LIPASE, BLOOD - Abnormal; Notable for the following components:  Result Value   Lipase 118 (*)    All other components within normal limits  COMPREHENSIVE METABOLIC PANEL - Abnormal; Notable for the following components:   CO2 18 (*)    Glucose, Bld 101 (*)    BUN 5 (*)    AST 14 (*)    Total Bilirubin 1.5 (*)    All other components within normal limits  URINALYSIS, ROUTINE W REFLEX MICROSCOPIC - Abnormal; Notable for the following components:   APPearance HAZY (*)    Ketones, ur 80 (*)    Protein, ur 30 (*)    All other components within normal limits  GLUCOSE, CAPILLARY - Abnormal; Notable for the following components:   Glucose-Capillary 104 (*)    All other components within normal limits  MRSA NEXT GEN BY PCR, NASAL  CBC  MAGNESIUM  CBC  BASIC METABOLIC PANEL  MAGNESIUM  PHOSPHORUS  PROTIME-INR  LACTIC ACID, PLASMA  BRAIN NATRIURETIC PEPTIDE  LIPASE, BLOOD  HEPATIC FUNCTION PANEL  URINE DRUGS OF ABUSE SCREEN W ALC, ROUTINE (REF LAB)  I-STAT BETA HCG BLOOD, ED ([facility], [facility], [facility] ONLY)  TROPONIN I (HIGH SENSITIVITY)  TROPONIN I (HIGH SENSITIVITY)    EKG EKG  Interpretation  Date/Time:  Thursday October 31 2022 10:20:59 EST Ventricular Rate:  158 PR Interval:    QRS Duration: 60 QT Interval:  306 QTC Calculation: 496 R Axis:   83 Text Interpretation: Supraventricular tachycardia Low voltage QRS Nonspecific ST and T wave abnormality Abnormal ECG When compared with ECG of 30-Oct-2022 18:51, PREVIOUS ECG IS PRESENT Sinus tachycardia No acute changes Confirmed by Georgina Snell [phone]) on 10/31/2022 3:43:25 PM  Radiology CT Angio Chest/Abd/Pel for Dissection W and/or Wo Contrast  Result Date: 10/31/2022 CLINICAL DATA:  Generalized abdominal pain greatest on right, chronic pancreatitis, nausea EXAM: CT ANGIOGRAPHY CHEST, ABDOMEN AND PELVIS TECHNIQUE: Non-contrast CT of the chest was initially obtained. Multidetector CT imaging through the chest, abdomen and pelvis was performed using the standard protocol during bolus administration of intravenous contrast. Multiplanar reconstructed images and MIPs were obtained and reviewed to evaluate the vascular anatomy. RADIATION DOSE REDUCTION: This exam was performed according to the departmental dose-optimization program which includes automated exposure control, adjustment of the mA and/or kV according to patient size and/or use of iterative reconstruction technique. CONTRAST:  137m OMNIPAQUE IOHEXOL 350 MG/ML SOLN COMPARISON:  10/30/2022, 10/28/2022, 07/04/2022 FINDINGS: CTA CHEST FINDINGS Cardiovascular: Stable type B thoracic aortic dissection beginning distal to the origin of the left subclavian artery. Maximal diameter of the proximal descending thoracic aorta at the level of dissection measures 4.7 cm, unchanged by my measurements since prior study. No evidence of intramural hematoma, leak, or rupture. The dissection extends through the abdominal aorta into the right common iliac artery, unchanged since the previous exam. Two vessel aortic arch again noted with common origin of the left common carotid and  innominate artery. Great vessels are widely patent. The heart is unremarkable without pericardial effusion. There is adequate opacification of the pulmonary vasculature, with no filling defects or pulmonary emboli identified. Mediastinum/Nodes: No enlarged mediastinal or hilar lymph nodes. There is enlarged right axillary lymph node measuring 1.5 x 2.6 cm reference image 23/5. No other axillary adenopathy. Thyroid gland, trachea, and esophagus demonstrate no significant findings. Lungs/Pleura: No acute airspace disease, effusion, or pneumothorax. The central airways are widely patent. Musculoskeletal: No acute or destructive bony lesions. Reconstructed images demonstrate no additional findings. Review of the MIP images confirms the above findings. CTA ABDOMEN AND PELVIS FINDINGS VASCULAR  Aorta: The type B thoracic aortic dissection extends the abdominal aorta, terminating in the right common iliac bifurcation. 3.5 cm abdominal aortic aneurysm at the level of the diaphragmatic hiatus unchanged. No evidence of aortic rupture. No critical stenosis. Celiac: The celiac artery is widely patent and arises from the true lumen. The dissection does not extend into the celiac artery. A 1 cm splenic artery aneurysm is identified on image 128/5, unchanged since the 10/28/2022 exam. No critical stenosis. SMA: The SMA arises from the true lumen. Dissection does not involve the SMA. No aneurysm or critical stenosis. 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 There is a fenestration within the dissection at the level of the renal arteries. The left renal artery is supplied by the true lumen. No critical stenosis, aneurysm, or vasculitis. IMA: The IMA arises from the true lumen and is patent. No critical stenosis. Inflow: Dissection extends into the right common iliac artery to the level the bifurcation. No critical stenosis within either iliac artery. Veins: Numerous venous collaterals left  upper quadrant compatible with the known narrowing of the splenic and portal veins, please see discussion on CT exam yesterday. Review of the MIP images confirms the above findings. NON-VASCULAR Hepatobiliary: No focal liver abnormality is seen. No gallstones, gallbladder wall thickening, or biliary dilatation. Pancreas: Inflammatory changes are again seen surrounding the pancreas, more pronounced on the exam performed yesterday, consistent with worsening acute pancreatitis. There is decreased enhancement of the pancreatic parenchyma within the head and body consistent with severe pancreatitis. Multiple cystic areas within the pancreatic head, as well as irregular cystic dilatation of the pancreatic duct are stable. Spleen: Normal in size without focal abnormality. Adrenals/Urinary Tract: Adrenal glands are unremarkable. Kidneys are normal, without renal calculi, focal lesion, or hydronephrosis. Bladder is unremarkable. Stomach/Bowel: No bowel obstruction or ileus. Normal appendix right lower quadrant. No bowel wall thickening or inflammatory change. Lymphatic: No pathologic adenopathy within the abdomen or pelvis. Subcentimeter lymph nodes surrounding the pancreas are likely reactive. Reproductive: Stable uterine fibroid.  No adnexal masses. Other: Trace free fluid within the pelvis. No free intraperitoneal gas. No abdominal wall hernia. Musculoskeletal: No acute or destructive bony lesions. Reconstructed images demonstrate no additional findings. Review of the MIP images confirms the above findings. IMPRESSION: Vascular: 1. Stable type B thoracoabdominal aortic dissection, extending from the distal aortic arch through the right common iliac artery bifurcation. There has been no change in the size, extent, or configuration of the dissection since prior exam 07/04/2022. 2. 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 This was discussed  on CT report yesterday. 3. Stable 1 cm splenic artery aneurysm. 4. Stable 3.5 cm abdominal aortic aneurysm. Recommend follow-up every 2 years. Nonvascular: 1. Interval decreased enhancement of the pancreatic parenchyma within the head and body, with progressive peripancreatic fat stranding, consistent with progressive severe acute pancreatitis. Multiple cystic areas within the pancreatic parenchyma and cystic dilatation of the pancreatic duct are unchanged since previous exams. 2. No evidence of pulmonary embolus. 3. Trace pelvic free fluid, likely reactive. Electronically Signed   By: Randa Ngo M.D.   On: 10/31/2022 19:48   CT ABDOMEN PELVIS W CONTRAST  Result Date: 10/30/2022 CLINICAL DATA:  Abdominal pain, blood in stool, known thoracic aortic dissection EXAM: CT ABDOMEN AND PELVIS WITH CONTRAST TECHNIQUE: Multidetector CT imaging of the abdomen and pelvis was performed using the standard protocol following bolus administration of intravenous contrast. RADIATION  DOSE REDUCTION: This exam was performed according to the departmental dose-optimization program which includes automated exposure control, adjustment of the mA and/or kV according to patient size and/or use of iterative reconstruction technique. CONTRAST:  57m OMNIPAQUE IOHEXOL 350 MG/ML SOLN COMPARISON:  10/28/2022, 07/30/2022 FINDINGS: Lower chest: No acute pleural or parenchymal lung disease. Hepatobiliary: No focal liver abnormality is seen. No gallstones, gallbladder wall thickening, or biliary dilatation. Pancreas: Inflammatory changes are seen surrounding the pancreas, with interstitial edema again seen within the pancreatic head and body. Findings are consistent with acute pancreatitis. Stable multiple small pancreatic cysts and cystic dilatation of the pancreatic duct. No evidence of abscess. Spleen: Normal in size without focal abnormality. Adrenals/Urinary Tract: Adrenal glands are unremarkable. Kidneys are normal, without renal  calculi, focal lesion, or hydronephrosis. Bladder is unremarkable. Stomach/Bowel: No bowel obstruction or ileus. Normal appendix right lower quadrant. No bowel wall thickening or inflammatory change. Vascular/Lymphatic: Stable dissection of the descending thoracic aorta and abdominal aorta, extending into the right common iliac artery. 3.5 cm proximal abdominal aortic aneurysm at the level of the diaphragmatic hiatus. No change since prior exam. Numerous venous collaterals are seen within the left upper quadrant likely related to extrinsic narrowing of the splenic vein and porta splenic confluence due to inflammatory changes from pancreatitis. No pathologic adenopathy. Reproductive: Exophytic fibroid off the lower uterine segment on the right. Stable appearance of the ovaries. Other: Trace pelvic free fluid. No free intraperitoneal gas. No abdominal wall hernia. Musculoskeletal: No acute or destructive bony lesions. Reconstructed images demonstrate no additional findings. IMPRESSION: 1. Stable acute pancreatitis. Numerous cystic lesions within the pancreas and irregular dilatation of the pancreatic duct are stable as well. 2. Stable type B chronic aortic dissection, extending from the descending thoracic aorta through the right common iliac artery. 3. 3.5 cm abdominal aortic aneurysm. Recommend follow-up every 2 years. Reference: J Am Coll Radiol 29470;96:283-662 4. 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 Numerous venous collaterals within the left quadrant. Electronically Signed   By: MRanda NgoM.D.   On: 10/30/2022 22:56    Procedures Procedures    Medications Ordered in ED Medications  esmolol (BREVIBLOC) 2000 mg / 100 mL (20 mg/mL) infusion (175 mcg/kg/min  79.4 kg Intravenous Rate/Dose Change 10/31/22 2300)  nicardipine (CARDENE) 27min 0.86% saline 20046mV infusion (0.1 mg/ml) (5 mg/hr Intravenous New Bag/Given  10/31/22 2231)  Chlorhexidine Gluconate Cloth 2 % PADS 6 each (6 each Topical Given 10/31/22 2200)  prochlorperazine (COMPAZINE) injection 10 mg (10 mg Intravenous Given 10/31/22 2306)  HYDROmorphone (DILAUDID) injection 0.5-2 mg (2 mg Intravenous Given 10/31/22 2249)  ondansetron (ZOFRAN) 8 mg in sodium chloride 0.9 % 50 mL IVPB (has no administration in time range)  nicotine (NICODERM CQ - dosed in mg/24 hours) patch 21 mg (has no administration in time range)  lipase/protease/amylase (CREON) capsule 36,000 Units (has no administration in time range)  ondansetron (ZOFRAN) injection 4 mg (4 mg Intravenous Given 10/31/22 1543)  HYDROmorphone (DILAUDID) injection 1 mg (1 mg Intravenous Given 10/31/22 1543)  lactated ringers bolus 1,000 mL (0 mLs Intravenous Stopped 10/31/22 1822)  HYDROmorphone (DILAUDID) injection 1 mg (1 mg Intravenous Given 10/31/22 1636)  diphenhydrAMINE (BENADRYL) injection 12.5 mg (12.5 mg Intravenous Given 10/31/22 1635)  pantoprazole (PROTONIX) injection 40 mg (40 mg Intravenous Given 10/31/22 1635)  labetalol (NORMODYNE) injection 10 mg (10 mg Intravenous Given 10/31/22 1701)  labetalol (NORMODYNE) injection 20 mg (20 mg Intravenous Given 10/31/22  1839)  iohexol (OMNIPAQUE) 350 MG/ML injection 100 mL (100 mLs Intravenous Contrast Given 10/31/22 1910)  labetalol (NORMODYNE) injection 20 mg (20 mg Intravenous Given 10/31/22 1930)  HYDROmorphone (DILAUDID) injection 1 mg (1 mg Intravenous Given 10/31/22 2020)  HYDROmorphone (DILAUDID) injection 1 mg (1 mg Intravenous Given 10/31/22 2141)  magnesium sulfate IVPB 2 g 50 mL (2 g Intravenous New Bag/Given 10/31/22 2143)    ED Course/ Medical Decision Making/ A&P                         Medical Decision Making Amount and/or Complexity of Data Reviewed Labs: ordered. Radiology: ordered.  Risk Prescription drug management. Decision regarding hospitalization.  Patient with known history of chronic pancreatitis, prior  etoh use disorder, cyclic vomiting and nausea presenting to ED after initial evaluation revealed acute on chronic pancreatitis without pain control. Patient was accepted for admission, left AMA to arrange child care, and is presenting today as she has not achieved pain control.   Reviewed laboratory and imaging results within the past 24 hrs and they were significant for: lipase to 89, increased T bili to 1.4. Imaging CT angio bleed peripancreatic stranding with pancreatic enhancement, ductal dilatation, and multiple cysts. Inflammatory changes around gallbladder. RUQ ultrasound with pericholecystic fluid, without evidence of cholecystitis. CT abdomen and pelvis with stable type B aortic dissection from descending thoracic aorta through R ilial artery and a 3.5 cm AAA.   Patient today reporting increased chest tightness and back pain. Likely this is in the setting of acute on chronic pancreatitis, but differential also includes ACS with atypical chest pain, progression of thoracic dissection, AAA, mesenteric ischemia, splenic infarction, PE  Patient has been tachycardic and hypertensive, likely in the setting of low PO intake, diarrhea, and acute pain. Given high risk for morbidity, will order a chest, abdomen, and pelvis dissection study.  Will admin 2 mg dilaudid, 12.80m benadryl, 1x 1L IVF, and 461mZofran. She was also given 1077mabetalol. Patient was also placed on cardiac monitoring  On re-evaluation, patient's pain is better controlled. However, she continued to be hypertensive to 164/125. Administered a dose of 20 mg labetalol.   CT dissection study with stable type B descending thoracic aorta dissection to R illiac artery. Spoke with CT surgery who recommended blood pressure control to SBP <120 and HR is ~60s given that patient is symptomatic and at high risk for progression of dissection. Patient is currently hypertensive with MAP 125-130s. Radial and DP pulses are intact bilaterally and  symmetrically. Neuro exam without acute motor or sensory deficits.   Patient was given 20 mg labetalol and started on Esmolol and nicardipine drips.  Consult to admission to ICU was placed. Patient was discussed with Dr. RamChase Callero will admit patient to the ICU.    Final Clinical Impression(s) / ED Diagnoses Final diagnoses:  Abdominal pain, unspecified abdominal location  Nausea and vomiting, unspecified vomiting type  Tachycardia  Dissection of aorta, unspecified portion of [facility]  Rx / DC Orders ED Discharge Orders     None         GomRomana JuniperD 10/31/22 2327    BraElgie CongoD 11/01/22 1222

## 2022-11-01 DIAGNOSIS — I160 Hypertensive urgency: Secondary | ICD-10-CM

## 2022-11-01 LAB — BASIC METABOLIC PANEL
Anion gap: 9 (ref 5–15)
BUN: <5 mg/dL — ABNORMAL LOW (ref 6–20)
CO2: 16 mmol/L — ABNORMAL LOW (ref 22–32)
Calcium: 8.7 mg/dL — ABNORMAL LOW (ref 8.9–10.3)
Chloride: 110 mmol/L (ref 98–111)
Creatinine, Ser: 0.61 mg/dL (ref 0.44–1.00)
GFR, Estimated: >60 mL/min (ref >60)
Glucose, Bld: 98 mg/dL (ref 70–99)
Potassium: 3.2 mmol/L — ABNORMAL LOW (ref 3.5–5.1)
Sodium: 135 mmol/L (ref 135–145)

## 2022-11-01 LAB — HEPATIC FUNCTION PANEL
ALT: 12 U/L (ref 0–44)
AST: 10 U/L — ABNORMAL LOW (ref 15–41)
Albumin: 3.1 g/dL — ABNORMAL LOW (ref 3.5–5.0)
Alkaline Phosphatase: 43 U/L (ref 38–126)
Bilirubin, Direct: 0.2 mg/dL (ref 0.0–0.2)
Indirect Bilirubin: 1 mg/dL — ABNORMAL HIGH (ref 0.3–0.9)
Total Bilirubin: 1.2 mg/dL (ref 0.3–1.2)
Total Protein: 5.8 g/dL — ABNORMAL LOW (ref 6.5–8.1)

## 2022-11-01 LAB — CBC
HCT: 33.2 % — ABNORMAL LOW (ref 36.0–46.0)
Hemoglobin: 11 g/dL — ABNORMAL LOW (ref 12.0–15.0)
MCH: 31.3 pg (ref 26.0–34.0)
MCHC: 33.1 g/dL (ref 30.0–36.0)
MCV: 94.3 fL (ref 80.0–100.0)
Platelets: 241 10*3/uL (ref 150–400)
RBC: 3.52 MIL/uL — ABNORMAL LOW (ref 3.87–5.11)
RDW: 14.4 % (ref 11.5–15.5)
WBC: 7.8 10*3/uL (ref 4.0–10.5)
nRBC: 0 % (ref 0.0–0.2)

## 2022-11-01 LAB — MAGNESIUM: Magnesium: 2.3 mg/dL (ref 1.7–2.4)

## 2022-11-01 LAB — GLUCOSE, CAPILLARY
Glucose-Capillary: 99 mg/dL (ref 70–99)
Glucose-Capillary: 81 mg/dL (ref 70–99)
Glucose-Capillary: 106 mg/dL — ABNORMAL HIGH (ref 70–99)
Glucose-Capillary: 117 mg/dL — ABNORMAL HIGH (ref 70–99)
Glucose-Capillary: 120 mg/dL — ABNORMAL HIGH (ref 70–99)

## 2022-11-01 LAB — MRSA NEXT GEN BY PCR, NASAL: MRSA by PCR Next Gen: NEGATIVE — AB

## 2022-11-01 LAB — PHOSPHORUS: Phosphorus: 2.8 mg/dL (ref 2.5–4.6)

## 2022-11-01 LAB — TROPONIN I (HIGH SENSITIVITY)
Troponin I (High Sensitivity): 5 ng/L (ref <18)
Troponin I (High Sensitivity): 8 ng/L (ref <18)

## 2022-11-01 LAB — PROTIME-INR
INR: 1.3 — ABNORMAL HIGH (ref 0.8–1.2)
Prothrombin Time: 15.7 seconds — ABNORMAL HIGH (ref 11.4–15.2)

## 2022-11-01 LAB — LIPASE, BLOOD: Lipase: 90 U/L — ABNORMAL HIGH (ref 11–51)

## 2022-11-01 LAB — LACTIC ACID, PLASMA: Lactic Acid, Venous: 0.7 mmol/L (ref 0.5–1.9)

## 2022-11-01 LAB — BRAIN NATRIURETIC PEPTIDE: B Natriuretic Peptide: 4.6 pg/mL (ref 0.0–100.0)

## 2022-11-01 MED ORDER — HYDROXYZINE HCL 25 MG PO TABS
25.0000 mg | ORAL_TABLET | Freq: Three times a day (TID) | ORAL | Status: DC | PRN
  Administered 2022-11-01: 25 mg via ORAL
  Filled 2022-11-01: qty 1

## 2022-11-01 MED ORDER — CLONIDINE HCL 0.1 MG PO TABS
0.2000 mg | ORAL_TABLET | Freq: Three times a day (TID) | ORAL | Status: DC
  Administered 2022-11-01 – 2022-11-03 (×5): 0.2 mg via ORAL
  Filled 2022-11-01 (×3): qty 1
  Filled 2022-11-01 (×2): qty 2

## 2022-11-01 MED ORDER — POTASSIUM CHLORIDE CRYS ER 20 MEQ PO TBCR
20.0000 meq | EXTENDED_RELEASE_TABLET | ORAL | Status: AC
  Administered 2022-11-01 (×2): 20 meq via ORAL
  Filled 2022-11-01 (×2): qty 1

## 2022-11-01 MED ORDER — LOSARTAN POTASSIUM 50 MG PO TABS
100.0000 mg | ORAL_TABLET | Freq: Every day | ORAL | Status: DC
  Administered 2022-11-01 – 2022-11-03 (×2): 100 mg via ORAL
  Filled 2022-11-01 (×3): qty 2

## 2022-11-01 MED ORDER — ENOXAPARIN SODIUM 40 MG/0.4ML IJ SOSY
40.0000 mg | PREFILLED_SYRINGE | INTRAMUSCULAR | Status: DC
  Administered 2022-11-01 – 2022-11-02 (×2): 40 mg via SUBCUTANEOUS
  Filled 2022-11-01 (×2): qty 0.4

## 2022-11-01 MED ORDER — PROPRANOLOL HCL 10 MG PO TABS
10.0000 mg | ORAL_TABLET | Freq: Two times a day (BID) | ORAL | Status: DC
  Administered 2022-11-01 – 2022-11-03 (×4): 10 mg via ORAL
  Filled 2022-11-01 (×6): qty 1

## 2022-11-01 MED ORDER — POTASSIUM CHLORIDE 10 MEQ/100ML IV SOLN
10.0000 meq | INTRAVENOUS | Status: AC
  Administered 2022-11-01 (×4): 10 meq via INTRAVENOUS
  Filled 2022-11-01 (×4): qty 100

## 2022-11-01 MED ORDER — AMLODIPINE BESYLATE 10 MG PO TABS
10.0000 mg | ORAL_TABLET | Freq: Every day | ORAL | Status: DC
  Administered 2022-11-01 – 2022-11-03 (×2): 10 mg via ORAL
  Filled 2022-11-01 (×2): qty 1

## 2022-11-01 NOTE — Progress Notes (Signed)
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  Most recent pancreatitis April 2023.  Complicated by portal hypertension due to portal vein and splenic vein narrowing.  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  She is also had EGD September 11, 2022 for celiac plexus block for pain management.  She is also had perihepatic pancreatic pseudocyst.  Most recently started on gabapentin September 04, 2022 for pain management.   Most recently she presented 10/28/2022 to the emergency department at [facility] long with nausea vomiting and some hematemesis intermittently since May 2023.  CT showed chronic splenic vein occlusion with stability of dissection and possible acute on chronic pancreatitis.  Ultrasound did not show any evidence of cholecystitis.  Discharged home on Reglan.  Presented again 10/30/2022 with some episode of hematochezia and ongoing abdominal pain and nausea vomiting.  Given multiple rounds of droperidol Zofran and Dilaudid.  Tachycardic 120.  Signed out AGAINST MEDICAL ADVICE 10/31/2022 early hours due to childcare issues but represented later in the morning with same symptoms.  Blood pressure elevated because she has been unable to take antihypertensives due to abdominal issues.  CT chest again showed stable aortic dissection type B.  Conversation with cardiothoracic surgery recommended systolic blood pressure goal between 110 and 120 and heart rate less than 60 systolic blood pressure 476/546 pulse of 136.  Started on both Cardene  and esmolol and CCM consulted for admission.     Urine tox positive for opioids and tetrahydrocannabinol 10/30/2022.  Last alcoholic drink several years ago.  Ongoing tobacco use present.   Patient requesting transfer post admission to [facility] to be with the hepatology team  Pertinent  Medical History   Past Medical History:  Diagnosis Date   Descending thoracic dissection ([place])    History of anemia    no current med.   Hypertension    states under control with med., has been on med. x 3 yr.   Pancreatitis     Significant Hospital Events: Including procedures, antibiotic start and stop dates in addition to other pertinent events   11/13: CTA GI bleed: No GI bleed, chronic type B aortic dissection stable 11/15: CT A/P >> acute pancreatitis 11/16: Admitted to ICU on nicardipine and esmolol drip 11/16: CTA C/A/P >> progressive severe acute pancreatitis, no PE, stable 1 cm splenic artery aneurysm  Interim History / Subjective:  No acute events overnight.  Remained on negative pain in the [place] drip Continues to have some abdominal pain, worse on the right. States that her appetite is coming back this morning Denies any chest pain, shortness of breath or dizziness  Objective   Blood pressure 111/81, pulse 93, temperature 97.6 F (36.4 C), temperature source Oral, resp. rate (!) 21, height '5\' 8"'$  (1.727 m), weight 75.8 kg, last menstrual period 09/29/2022, SpO2 (!) 88 %.        Intake/Output Summary (Last 24 hours) at 11/01/2022 0722 Last data filed at 11/01/2022 0600 Gross per 24 hour  Intake 2090.54 ml  Output --  Net 2090.54 ml   Filed Weights   10/31/22 1051 11/01/22  0500  Weight: 79.4 kg 75.8 kg   Examination: General: Well-appearing young female laying in bed.  NAD. HENT: [place]/AT.  Moist mucous membrane. Lungs: CTAB.  No wheezing or rales. Cardiovascular: Tachycardic.  Regular rhythm.  S1, S2. Abdomen: Soft.  Mildly tender, worse RUQ.  Normal bowel  sounds. Extremities: Warm and dry.  Well perfused. Neuro: Alert and oriented x3.  Lipase 90, BMP 4.6, lactic acid 0.7, PT/INR 15.7/1.3 WBC 7.8, Hgb 11.0, platelet 240 LFTs unremarkable, normal bili CBGs 90s to 100s  Resolved Hospital Problem list   Hypomagnesemia  Assessment & Plan:   Severe hypertension Chronic type B aortic dissection Blood pressure improved overnight on nicardipine and esmolol drip. SBP in the 110s to 120s this morning. Patient denies any chest pain, shortness of breath or dizziness. Tolerating p.o. intake this morning.  Plan to resume home antihypertensive regiment and wean off drips. 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 Patient reports her abdominal pain is slowly improving. She is starting to regain her appetite and would like to eat this morning. Abdomen mildly tender. -Continue Creon with meals -Advance diet as tolerated -Continue PRN IV Dilaudid for pain control -Continue PRN Zofran and Compazine for N/V -Follow-up with [facility] concerning transfer  Hypokalemia K+ 3.2. Likely in the setting of nausea vomiting.  Mag and Phos within normal limits. -Replete K+ -BMP  Polysubstance use History of tobacco and marijuana use. -Nicotine patch -Follow-up [facility]  Best Practice (right click and "Reselect all SmartList Selections" daily)   Diet/type: Regular consistency (see orders) DVT prophylaxis: LMWH GI prophylaxis: N/A Lines: N/A Foley:  N/A Code Status:  full code Last date of multidisciplinary goals of care discussion [pending]  Labs   CBC: Recent Labs  Lab 10/28/22 1423 10/30/22 1755 10/31/22 1524 10/31/22 2330  WBC 7.2 8.3 8.0 7.8  NEUTROABS 5.5  6.5  --   --   HGB 12.5 13.8 12.9 11.0*  HCT 39.4 40.2 38.9 33.2*  MCV 95.4 92.2 93.3 94.3  PLT 279 279 243 253    Basic Metabolic Panel: Recent Labs  Lab 10/28/22 1423 10/30/22 1755 10/31/22 1524 10/31/22 1835 10/31/22 2330  NA 139 139 137  --  135  K 3.5 3.7 3.6  --  3.2*  CL 108 105 105  --  110  CO2 22 19* 18*  --  16*  GLUCOSE 89 85 101*  --  98  BUN 12 <5* 5*  --  <5*  CREATININE 0.73 0.72 0.84  --  0.61  CALCIUM 9.1 9.7 9.4  --  8.7*  MG  --   --   --  1.7 2.3  PHOS  --   --   --   --  2.8   GFR: Estimated Creatinine Clearance: 98.1 mL/min (by C-G formula based on SCr of 0.61 mg/dL). Recent Labs  Lab 10/28/22 1423 10/30/22 1755 10/31/22 1524 10/31/22 2330  WBC 7.2 8.3 8.0 7.8  LATICACIDVEN  --   --   --  0.7    Liver Function Tests: Recent Labs  Lab 10/28/22 1423 10/30/22 1755 10/31/22 1524 10/31/22 2330  AST 13* 14* 14* 10*  ALT '14 13 13 12  '$ ALKPHOS 50 55 55 43  BILITOT 1.2 1.4* 1.5*  1.2  PROT 7.5 8.0 7.2 5.8*  ALBUMIN 3.9 4.0 3.9 3.1*   Recent Labs  Lab 10/28/22 1423 10/30/22 1755 10/31/22 1524 10/31/22 2330  LIPASE 98* 89* 118* 90*   No results for input(s): "AMMONIA" in the last 168 hours.  ABG    Component Value Date/Time   TCO2 25 08/09/2013 1333     Coagulation Profile: Recent Labs  Lab 10/31/22 2330  INR 1.3*    Cardiac Enzymes: No results for input(s): "CKTOTAL", "CKMB", "CKMBINDEX", "TROPONINI" in the last 168 hours.  HbA1C: No results found for: "HGBA1C"  CBG: Recent Labs  Lab 10/31/22 2308 11/01/22 0329  GLUCAP 104* 99    Review of Systems:   As stated in HPI  Past Medical History:  She,  has a past medical history of Descending thoracic dissection ([place]), History of anemia, Hypertension, and Pancreatitis.   Surgical History:   Past Surgical History:  Procedure Laterality Date   BIOPSY  09/11/2022   Procedure: BIOPSY;  Surgeon: Rush Landmark Telford Nab., MD;  Location: [facility];  Service:  Gastroenterology;;   ESOPHAGOGASTRODUODENOSCOPY N/A 06/06/2022   Procedure: ESOPHAGOGASTRODUODENOSCOPY (EGD);  Surgeon: Arta Silence, MD;  Location: [facility];  Service: Gastroenterology;  Laterality: N/A;   ESOPHAGOGASTRODUODENOSCOPY N/A 06/14/2022   Procedure: ESOPHAGOGASTRODUODENOSCOPY (EGD);  Surgeon: [name], MD;  Location: [facility];  Service: Gastroenterology;  Laterality: N/A;   ESOPHAGOGASTRODUODENOSCOPY (EGD) WITH PROPOFOL N/A 08/01/2022   Procedure: ESOPHAGOGASTRODUODENOSCOPY (EGD) WITH PROPOFOL;  Surgeon: Clarene Essex, MD;  Location: [facility];  Service: Gastroenterology;  Laterality: N/A;   ESOPHAGOGASTRODUODENOSCOPY (EGD) WITH PROPOFOL N/A 09/11/2022   Procedure: ESOPHAGOGASTRODUODENOSCOPY (EGD) WITH PROPOFOL;  Surgeon: Rush Landmark Telford Nab., MD;  Location: [facility];  Service: Gastroenterology;  Laterality: N/A;   EUS N/A 09/11/2022   Procedure: UPPER ENDOSCOPIC ULTRASOUND (EUS) RADIAL;  Surgeon: Irving Copas., MD;  Location: [facility];  Service: Gastroenterology;  Laterality: N/A;   FOREIGN BODY REMOVAL Left 02/10/2015   Procedure: ATTEMPTED EXPLANTATION OF BIRTH CONTROL DEVICE LEFT ARM;  Surgeon: Johnathan Hausen, MD;  Location: [place];  Service: General;  Laterality: Left;   FOREIGN BODY REMOVAL Left 05/12/2015   Procedure: REMOVAL OF LEFT ARM BIRTH CONTROL DEVICE;  Surgeon: Johnathan Hausen, MD;  Location: [place];  Service: General;  Laterality: Left;   NEUROLYTIC CELIAC PLEXUS N/A 09/11/2022   Procedure: NEUROLYTIC CELIAC PLEXUS;  Surgeon: Irving Copas., MD;  Location: [facility];  Service: Gastroenterology;  Laterality: N/A;   SUBMANDIBULAR GLAND EXCISION Right 05/01/2009     Social History:   reports that she has been smoking cigarettes. She has a 0.60 pack-year smoking history. She has never used smokeless tobacco. She reports that she does not currently use alcohol. She reports that she does not  use drugs.   Family History:  Her family history includes Breast cancer (age of onset: 73) in her mother; Heart disease in her mother; Hypertension in her mother. There is no history of Colon cancer, Esophageal cancer, Inflammatory bowel disease, Liver disease, Pancreatic cancer, Rectal cancer, or Stomach cancer.   Allergies Allergies  Allergen Reactions   Ativan [Lorazepam] Other (See Comments)    Disorientated, combative   Tape Rash    Clear adhesive tape     Home Medications  Prior to Admission medications   Medication Sig Start Date End Date Taking? Authorizing Provider  acetaminophen (TYLENOL) 500 MG tablet Take 1,000 mg by mouth every 6 (six) hours as needed for moderate pain.   Yes [provider]  amLODipine (NORVASC) 10 MG tablet Take 1 tablet (10 mg total) by mouth daily. 03/08/22  Yes Patwardhan, Manish J, MD  cloNIDine (CATAPRES) 0.2 MG tablet Take 1 tablet (0.2 mg total) by mouth every 8 (eight) hours. 03/08/22 06/12/23 Yes Patwardhan, Manish J, MD  hydrALAZINE (APRESOLINE) 100 MG tablet TAKE 1 TABLET BY MOUTH 3 TIMES DAILY. 08/20/22  Yes Patwardhan, Manish J, MD  losartan (COZAAR) 100 MG tablet Take 1 tablet (100 mg total) by mouth daily. 06/08/22 10/31/22 Yes Ghimire, Dante Gang, MD  metoCLOPramide (REGLAN) 10 MG tablet Take 1 tablet (10 mg total) by mouth every 6 (six) hours. 10/28/22  Yes Fransico Meadow, MD  Multiple Vitamins-Minerals (MULTIVITAMIN WITH MINERALS) tablet Take 1 tablet by mouth daily.   Yes [provider]  ondansetron (ZOFRAN) 4 MG tablet Take 1 tablet (4 mg total) by mouth every 8 (eight) hours as needed for nausea or vomiting. 06/08/22  Yes Ghimire, Dante Gang, MD  Pancrelipase, Lip-Prot-Amyl, (ZENPEP) 40000-126000 units CPEP Take 1-2 capsules by mouth See admin instructions. 2 tablets with each meal and 1 tablet with snacks   Yes [provider]  pantoprazole (PROTONIX) 40 MG tablet Take 40 mg by mouth 2 (two) times daily before a meal.    Yes [provider]  propranolol (INDERAL) 10 MG tablet Take 1 tablet (10 mg total) by mouth 2 (two) times daily. 06/08/22 10/31/22 Yes Barb Merino, MD  vitamin B-12 1000 MCG tablet Take 1 tablet (1,000 mcg total) by mouth daily. 06/15/22  Yes Regalado, Belkys A, MD  Bismuth/Metronidaz/Tetracyclin 518 080 7421 MG CAPS Take 3 capsules by mouth 4 (four) times daily. Patient not taking: Reported on 10/31/2022 09/26/22   [provider]  oxyCODONE (ROXICODONE) 5 MG immediate release tablet Take 1 tablet (5 mg total) by mouth every 8 (eight) hours as needed. Patient not taking: Reported on 10/30/2022 09/11/22 09/11/23  Mansouraty, Telford Nab., MD  oxyCODONE-acetaminophen (PERCOCET/ROXICET) 5-325 MG tablet Take 1 tablet by mouth every 6 (six) hours as needed for severe pain. 10/31/22   Sponseller, Eugene Garnet R, PA-C  polyethylene glycol (MIRALAX / GLYCOLAX) 17 g packet Take 17 g by mouth daily. Patient not taking: Reported on 10/31/2022 06/15/22   Regalado, Jerald Kief A, MD  promethazine (PHENERGAN) 12.5 MG tablet Take 1 tablet (12.5 mg total) by mouth every 8 (eight) hours as needed for nausea or vomiting. Patient not taking: Reported on 10/30/2022 07/05/22   Mansouraty, Telford Nab., MD     Critical care time: 70

## 2022-11-01 NOTE — Progress Notes (Signed)
[facility] ADULT ICU REPLACEMENT PROTOCOL   The patient does apply for the [facility] Adult ICU Electrolyte Replacment Protocol based on the criteria listed below:   1.Exclusion criteria: TCTS, ECMO, Dialysis, and Myasthenia Gravis patients 2. Is GFR >/= 30 ml/min? Yes.    Patient's GFR today is >60 3. Is SCr </= 2? Yes.   Patient's SCr is 0.61 mg/dL 4. Did SCr increase >/= 0.5 in 24 hours? No. 5.Pt's weight >40kg  Yes.   6. Abnormal electrolyte(s): K+ 3.2  7. Electrolytes replaced per protocol 8.  Call MD STAT for K+ </= 2.5, Phos </= 1, or Mag </= 1 Physician:  n/a  Robin Guerrero 11/01/2022 1:38 AM

## 2022-11-01 NOTE — Progress Notes (Signed)
eLink Physician-Brief Progress Note Patient Name: Robin Guerrero DOB: [birth year] MRN: [id number]   Date of Service  11/01/2022  HPI/Events of Note  Received request for anxiolytic.    BP 107/77, HR 91, RR 15, O2 sats 97%.   eICU Interventions  Trial of hydroxyzine.     Intervention Category Minor Interventions: Agitation / anxiety - evaluation and management  Elsie Lincoln 11/01/2022, 8:22 PM

## 2022-11-01 NOTE — Progress Notes (Addendum)
NCM received : Get transfer coordinator at [facility]. [facility]'s Patient Logistics # is [phone]. Whitman Hero RN, BSN,CM

## 2022-11-02 DIAGNOSIS — I7103 Dissection of thoracoabdominal aorta: Secondary | ICD-10-CM | POA: Diagnosis not present

## 2022-11-02 LAB — BASIC METABOLIC PANEL
Anion gap: 6 (ref 5–15)
BUN: <5 mg/dL — ABNORMAL LOW (ref 6–20)
CO2: 20 mmol/L — ABNORMAL LOW (ref 22–32)
Calcium: 8.7 mg/dL — ABNORMAL LOW (ref 8.9–10.3)
Chloride: 112 mmol/L — ABNORMAL HIGH (ref 98–111)
Creatinine, Ser: 0.7 mg/dL (ref 0.44–1.00)
GFR, Estimated: >60 mL/min (ref >60)
Glucose, Bld: 94 mg/dL (ref 70–99)
Potassium: 4 mmol/L (ref 3.5–5.1)
Sodium: 138 mmol/L (ref 135–145)

## 2022-11-02 LAB — CBC
HCT: 30 % — ABNORMAL LOW (ref 36.0–46.0)
Hemoglobin: 9.7 g/dL — ABNORMAL LOW (ref 12.0–15.0)
MCH: 31.5 pg (ref 26.0–34.0)
MCHC: 32.3 g/dL (ref 30.0–36.0)
MCV: 97.4 fL (ref 80.0–100.0)
Platelets: 160 10*3/uL (ref 150–400)
RBC: 3.08 MIL/uL — ABNORMAL LOW (ref 3.87–5.11)
RDW: 14.6 % (ref 11.5–15.5)
WBC: 3.7 10*3/uL — ABNORMAL LOW (ref 4.0–10.5)
nRBC: 0 % (ref 0.0–0.2)

## 2022-11-02 LAB — MAGNESIUM: Magnesium: 1.7 mg/dL (ref 1.7–2.4)

## 2022-11-02 NOTE — Progress Notes (Signed)
Pt transferred to [facility] 10 without complications and with all belongings. Report given to [facility].

## 2022-11-02 NOTE — Progress Notes (Signed)
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  Most recent pancreatitis April 2023.  Complicated by portal hypertension due to portal vein and splenic vein narrowing.  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  She is also had EGD September 11, 2022 for celiac plexus block for pain management.  She is also had perihepatic pancreatic pseudocyst.  Most recently started on gabapentin September 04, 2022 for pain management.   She presented 10/28/2022 to the emergency department at [facility] long with nausea vomiting and some hematemesis intermittently since May 2023.  CT showed chronic splenic vein occlusion with stability of dissection and possible acute on chronic pancreatitis.  Ultrasound did not show any evidence of cholecystitis.  Discharged home on Reglan.    Presented again 10/30/2022 with some episode of hematochezia and ongoing abdominal pain and nausea vomiting.  Given multiple rounds of droperidol Zofran and Dilaudid.  Tachycardic 120.  Signed out AGAINST MEDICAL ADVICE 10/31/2022 early hours due to childcare issues but represented later in the morning with same symptoms.  Blood pressure elevated because she has been unable to take antihypertensives due to abdominal issues.  CT chest again showed stable aortic dissection type B.  Conversation with cardiothoracic surgery recommended systolic blood pressure goal between 110 and 120 and heart rate less than 60 systolic blood pressure 250/037 pulse of 136.  Started on both Cardene and esmolol  and CCM consulted for admission.     Urine tox positive for opioids and tetrahydrocannabinol 10/30/2022.  Last alcoholic drink several years ago.  Ongoing tobacco use present.   Patient requesting transfer post admission to [facility] to be with the hepatology team.  Pertinent  Medical History   Past Medical History:  Diagnosis Date   Descending thoracic dissection ([place])    History of anemia    no current med.   Hypertension    states under control with med., has been on med. x 3 yr.   Pancreatitis     Significant Hospital Events: Including procedures, antibiotic start and stop dates in addition to other pertinent events   11/13  CTA GI bleed: No GI bleed, chronic type B aortic dissection stable 11/15 CT A/P >> acute pancreatitis 11/16 Admitted to ICU on nicardipine and esmolol drip 11/16 CTA C/A/P >> progressive severe acute pancreatitis, no PE, stable 1 cm splenic artery aneurysm 11/17 Off gtts at 5pm   Interim History / Subjective:  Pt reports no change in back and abd pain.  States she is eating but not tolerating foods like normal - little appetite  Afebrile   Objective   Blood pressure 97/76, pulse 82, temperature 98.1 F (36.7 C), temperature source Oral, resp. rate 12, height '5\' 8"'$  (1.727 m), weight 79.3 kg, last menstrual period 09/29/2022, SpO2 95 %.        Intake/Output Summary (Last 24 hours) at 11/02/2022 0721 Last data filed at 11/01/2022 1740 Gross per 24 hour  Intake 528.65 ml  Output 0 ml  Net 528.65 ml   Filed Weights   10/31/22 1051 11/01/22 0500 11/02/22 0500  Weight: 79.4 kg 75.8 kg  79.3 kg   Examination: General: young adult female lying in bed in NAD  HEENT: MM pink/moist, anicteric, pupils =/reactive  Neuro: AAOx4, speech clear, MAE  CV: s1s2 RRR, no m/r/g PULM: non-labored at rest, lungs bilaterally clear with good air entry  GI: soft, bsx4 active, tolerating liquids / PO's Extremities: warm/dry, no edema  Skin: no rashes or  lesions  Resolved Hospital Problem list   Hypomagnesemia  Assessment & Plan:   Severe Hypertension Chronic type B aortic dissection Blood pressure improved on nicardipine and esmolol drip. Off gtt's 11/17 PM.  -continue amlodipine '10mg'$  QD -losartan '100mg'$  QD -clonidine 0.'2mg'$  Q8 -propranolol 10 mg BID  -hold hydralazine for now  -tele monitoring  -transfer to medical tele   Acute on chronic pancreatitis Chronic pancreatic pseudocyst Chronic splenic vein narrowing Lipase mildly elevated at 90 but trending down. Patient reports her abdominal pain is slowly improving. She is starting to regain her appetite and is trying to eat but noted she ate too much yesterday and it felt "heavy". Abdomen mildly tender. -continue creon with meals  -diet as tolerated  -PRN IV dilaudid -PRN zofran, compazine for N/V  Hypokalemia Likely in the setting of nausea vomiting.  Mag and Phos within normal limits. -monitor, replace as indicated  -await am labs   Polysubstance use History of tobacco and marijuana use. Positive for opiates/THC on admit.  -nicotine patch  -cessation counseling   Best Practice (right click and "Reselect all SmartList Selections" daily)  Diet/type: Regular consistency (see orders) DVT prophylaxis: LMWH GI prophylaxis: N/A Lines: N/A Foley:  N/A Code Status:  full code Last date of multidisciplinary goals of care discussion: unchanged, full code.   Transfer to [facility], to medical telemetry.   Critical care time: n/a   Noe Gens, MSN, APRN, NP-C, AGACNP-BC [facility] 11/02/2022, 7:30 AM   Please see [email or web address] for pager details.   From 7A-7P if no response, please call [phone] After hours, please call ELink [phone]

## 2022-11-02 NOTE — Plan of Care (Signed)
  Problem: Clinical Measurements: Goal: Respiratory complications will improve Outcome: Progressing Goal: Cardiovascular complication will be avoided Outcome: Progressing   Problem: Activity: Goal: Risk for activity intolerance will decrease Outcome: Progressing   Problem: Coping: Goal: Level of anxiety will decrease Outcome: Progressing   Problem: Elimination: Goal: Will not experience complications related to urinary retention Outcome: Progressing   Problem: Pain Managment: Goal: General experience of comfort will improve Outcome: Not Progressing

## 2022-11-03 DIAGNOSIS — I7103 Dissection of thoracoabdominal aorta: Secondary | ICD-10-CM | POA: Diagnosis not present

## 2022-11-03 LAB — CBC
HCT: 28.8 % — ABNORMAL LOW (ref 36.0–46.0)
Hemoglobin: 9.4 g/dL — ABNORMAL LOW (ref 12.0–15.0)
MCH: 31.3 pg (ref 26.0–34.0)
MCHC: 32.6 g/dL (ref 30.0–36.0)
MCV: 96 fL (ref 80.0–100.0)
Platelets: 147 10*3/uL — ABNORMAL LOW (ref 150–400)
RBC: 3 MIL/uL — ABNORMAL LOW (ref 3.87–5.11)
RDW: 14.4 % (ref 11.5–15.5)
WBC: 2.9 10*3/uL — ABNORMAL LOW (ref 4.0–10.5)
nRBC: 0 % (ref 0.0–0.2)

## 2022-11-03 LAB — BASIC METABOLIC PANEL
Anion gap: 12 (ref 5–15)
BUN: 6 mg/dL (ref 6–20)
CO2: 20 mmol/L — ABNORMAL LOW (ref 22–32)
Calcium: 8.8 mg/dL — ABNORMAL LOW (ref 8.9–10.3)
Chloride: 107 mmol/L (ref 98–111)
Creatinine, Ser: 0.66 mg/dL (ref 0.44–1.00)
GFR, Estimated: >60 mL/min (ref >60)
Glucose, Bld: 109 mg/dL — ABNORMAL HIGH (ref 70–99)
Potassium: 3.6 mmol/L (ref 3.5–5.1)
Sodium: 139 mmol/L (ref 135–145)

## 2022-11-03 MED ORDER — ORAL CARE MOUTH RINSE: 15.0000 mL | OROMUCOSAL | Status: DC | PRN

## 2022-11-03 NOTE — Discharge Summary (Signed)
Physician Discharge Summary  Erinne Gillentine NOI:[id number] DOB: [birth year] DOA: 10/31/2022  PCP: Trey Sailors, PA  Admit date: 10/31/2022 Discharge date: 11/03/2022 30 Day Unplanned Readmission Risk Score    Flowsheet Row ED to Hosp-Admission (Current) from 10/31/2022 in [place] [facility]  30 Day Unplanned Readmission Risk Score (%) 43.9 Filed at 11/03/2022 0801       This score is the patient's risk of an unplanned readmission within 30 days of being discharged (0 -100%). The score is based on dignosis, age, lab data, medications, orders, and past utilization.   Low:  0-14.9   Medium: 15-21.9   High: 22-29.9   Extreme: 30 and above          Admitted From: Home Disposition: Home  Recommendations for Outpatient Follow-up:  Follow up with PCP in 1-2 weeks Please obtain BMP/CBC in one week Please follow up with your PCP on the following pending results: Unresulted Labs (From admission, onward)     Start     Ordered   10/31/22 2233  Urine drugs of abuse scrn w alc, routine (Ref Lab)  Once,   R        10/31/22 2232              Home Health: None Equipment/Devices: None  Discharge Condition: Stable CODE STATUS: Full code Diet recommendation: Cardiac  Subjective: Seen and examined.  She has chronic abdominal pain which is stable.  Otherwise, she has no other complaint.  She is tolerating a diet and she feels comfortable going home today.  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  Most recent pancreatitis April 2023.  Complicated by portal hypertension due to portal vein and splenic vein narrowing.  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  She is also had EGD  September 11, 2022 for celiac plexus block for pain management.  She is also had perihepatic pancreatic pseudocyst.  Most recently started on gabapentin September 04, 2022 for pain management.   She presented 10/28/2022 to the emergency department at [facility] with nausea vomiting and some hematemesis intermittently since May 2023.  CT showed chronic splenic vein occlusion with stability of dissection and possible acute on chronic pancreatitis.  Ultrasound did not show any evidence of cholecystitis.  Discharged home on Reglan.     Presented again 10/30/2022 with some episode of hematochezia and ongoing abdominal pain and nausea vomiting.  Given multiple rounds of droperidol Zofran and Dilaudid.  Tachycardic 120.  Signed out AGAINST MEDICAL ADVICE 10/31/2022 early hours due to childcare issues but represented later in the morning with same symptoms.  Blood pressure elevated because she has been unable to take antihypertensives due to abdominal issues.  CT chest again showed stable aortic dissection type B.  Conversation with cardiothoracic surgery recommended systolic blood pressure goal between 110 and 120 and heart rate less than 60 systolic blood pressure 694/503 pulse of 136.  Started on both Cardene and esmolol and CCM consulted for admission.     Urine tox positive for opioids and tetrahydrocannabinol 10/30/2022.  Last alcoholic drink several years ago.  Ongoing tobacco use present.  Significant Hospital Events: Including procedures, antibiotic start and stop dates in addition to other pertinent events   11/13  CTA GI  bleed: No GI bleed, chronic type B aortic dissection stable 11/15 CT A/P >> acute pancreatitis 11/16 Admitted to ICU on nicardipine and esmolol drip 11/16 CTA C/A/P >> progressive severe acute pancreatitis, no PE, stable 1 cm splenic artery aneurysm 11/17 Off gtts at 5pm  11/19: Patient was transferred under [facility].  Seen and examined by myself.  She has no new complaint.   Tolerating diet.  She is stable for discharge and she is agreeable for discharge as well.  Severe Hypertension Chronic type B aortic dissection Blood pressure improved on nicardipine and esmolol drip. Off gtt's 11/17 PM.  -continue amlodipine '10mg'$  QD -losartan '100mg'$  QD -clonidine 0.'2mg'$  Q8 -propranolol 10 mg BID .  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  Abdominal pain is improved, now back at baseline and she has chronic abdominal pain.  She is tolerating diet.  She feels ready for discharge. -continue creon with meals and resume all medications.   Hypokalemia: Resolved.   Polysubstance use History of tobacco and marijuana use. Positive for opiates/THC on admit.  -nicotine patch  -cessation counseling   Mild leukopenia and thrombocytopenia: Likely due to acute illness.  Recommend checking CBC at PCPs visit in 1 week.  Hemoglobin is stable.  Discharge plan was discussed with patient and/or family member and they verbalized understanding and agreed with it.  Discharge Diagnoses:  Principal Problem:   Aortic dissection, thoracoabdominal (HCC) Active Problems:   Chronic generalized abdominal pain   Hypertensive urgency    Discharge Instructions   Allergies as of 11/03/2022       Reactions   Ativan [lorazepam] Other (See Comments)   Disorientated, combative   Tape Rash   Clear adhesive tape        Medication List     STOP taking these medications    polyethylene glycol 17 g packet Commonly known as: MIRALAX / GLYCOLAX       TAKE these medications    acetaminophen 500 MG tablet Commonly known as: TYLENOL Take 1,000 mg by mouth every 6 (six) hours as needed for moderate pain.   amLODipine 10 MG tablet Commonly known as: NORVASC Take 1 tablet (10 mg total) by mouth daily.   Bismuth/Metronidaz/Tetracyclin 140-125-125 MG Caps Take 3 capsules by  mouth 4 (four) times daily.   cloNIDine 0.2 MG tablet Commonly known as: CATAPRES Take 1 tablet (0.2 mg total) by mouth every 8 (eight) hours.   cyanocobalamin 1000 MCG tablet Take 1 tablet (1,000 mcg total) by mouth daily.   hydrALAZINE 100 MG tablet Commonly known as: APRESOLINE TAKE 1 TABLET BY MOUTH 3 TIMES DAILY.   losartan 100 MG tablet Commonly known as: COZAAR Take 1 tablet (100 mg total) by mouth daily.   metoCLOPramide 10 MG tablet Commonly known as: REGLAN Take 1 tablet (10 mg total) by mouth every 6 (six) hours.   multivitamin with minerals tablet Take 1 tablet by mouth daily.   ondansetron 4 MG tablet Commonly known as: Zofran Take 1 tablet (4 mg total) by mouth every 8 (eight) hours as needed for nausea or vomiting.   oxyCODONE 5 MG immediate release tablet Commonly known as: Roxicodone Take 1 tablet (5 mg total) by mouth every 8 (eight) hours as needed.   oxyCODONE-acetaminophen 5-325 MG tablet Commonly known as: PERCOCET/ROXICET Take 1 tablet by mouth every 6 (six) hours as needed for severe pain.   pantoprazole 40 MG tablet Commonly known  as: PROTONIX Take 40 mg by mouth 2 (two) times daily before a meal.   promethazine 12.5 MG tablet Commonly known as: PHENERGAN Take 1 tablet (12.5 mg total) by mouth every 8 (eight) hours as needed for nausea or vomiting.   propranolol 10 MG tablet Commonly known as: INDERAL Take 1 tablet (10 mg total) by mouth 2 (two) times daily.   Zenpep 40000-126000 units Cpep Generic drug: Pancrelipase (Lip-Prot-Amyl) Take 1-2 capsules by mouth See admin instructions. 2 tablets with each meal and 1 tablet with snacks        Follow-up Information     Trey Sailors, PA Follow up in 1 week(s).   Specialty: Physician Assistant Contact information: [place] [address] [phone]                Allergies  Allergen Reactions   Ativan [Lorazepam] Other (See Comments)     Disorientated, combative   Tape Rash    Clear adhesive tape    Consultations: PCCM   Procedures/Studies: CT Angio Chest/Abd/Pel for Dissection W and/or Wo Contrast  Result Date: 10/31/2022 CLINICAL DATA:  Generalized abdominal pain greatest on right, chronic pancreatitis, nausea EXAM: CT ANGIOGRAPHY CHEST, ABDOMEN AND PELVIS TECHNIQUE: Non-contrast CT of the chest was initially obtained. Multidetector CT imaging through the chest, abdomen and pelvis was performed using the standard protocol during bolus administration of intravenous contrast. Multiplanar reconstructed images and MIPs were obtained and reviewed to evaluate the vascular anatomy. RADIATION DOSE REDUCTION: This exam was performed according to the departmental dose-optimization program which includes automated exposure control, adjustment of the mA and/or kV according to patient size and/or use of iterative reconstruction technique. CONTRAST:  190m OMNIPAQUE IOHEXOL 350 MG/ML SOLN COMPARISON:  10/30/2022, 10/28/2022, 07/04/2022 FINDINGS: CTA CHEST FINDINGS Cardiovascular: Stable type B thoracic aortic dissection beginning distal to the origin of the left subclavian artery. Maximal diameter of the proximal descending thoracic aorta at the level of dissection measures 4.7 cm, unchanged by my measurements since prior study. No evidence of intramural hematoma, leak, or rupture. The dissection extends through the abdominal aorta into the right common iliac artery, unchanged since the previous exam. Two vessel aortic arch again noted with common origin of the left common carotid and innominate artery. Great vessels are widely patent. The heart is unremarkable without pericardial effusion. There is adequate opacification of the pulmonary vasculature, with no filling defects or pulmonary emboli identified. Mediastinum/Nodes: No enlarged mediastinal or hilar lymph nodes. There is enlarged right axillary lymph node measuring 1.5 x 2.6 cm reference  image 23/5. No other axillary adenopathy. Thyroid gland, trachea, and esophagus demonstrate no significant findings. Lungs/Pleura: No acute airspace disease, effusion, or pneumothorax. The central airways are widely patent. Musculoskeletal: No acute or destructive bony lesions. Reconstructed images demonstrate no additional findings. Review of the MIP images confirms the above findings. CTA ABDOMEN AND PELVIS FINDINGS VASCULAR Aorta: The type B thoracic aortic dissection extends the abdominal aorta, terminating in the right common iliac bifurcation. 3.5 cm abdominal aortic aneurysm at the level of the diaphragmatic hiatus unchanged. No evidence of aortic rupture. No critical stenosis. Celiac: The celiac artery is widely patent and arises from the true lumen. The dissection does not extend into the celiac artery. A 1 cm splenic artery aneurysm is identified on image 128/5, unchanged since the 10/28/2022 exam. No critical stenosis. SMA: The SMA arises from the true lumen. Dissection does not involve the SMA. No aneurysm or critical stenosis. Renals: There are duplicated right  renal arteries, both arising from the false lumen. There is a fenestration within the dissection at the level of the renal arteries. The left renal artery is supplied by the true lumen. No critical stenosis, aneurysm, or vasculitis. IMA: The IMA arises from the true lumen and is patent. No critical stenosis. Inflow: Dissection extends into the right common iliac artery to the level the bifurcation. No critical stenosis within either iliac artery. Veins: Numerous venous collaterals left upper quadrant compatible with the known narrowing of the splenic and portal veins, please see discussion on CT exam yesterday. Review of the MIP images confirms the above findings. NON-VASCULAR Hepatobiliary: No focal liver abnormality is seen. No gallstones, gallbladder wall thickening, or biliary dilatation. Pancreas: Inflammatory changes are again seen  surrounding the pancreas, more pronounced on the exam performed yesterday, consistent with worsening acute pancreatitis. There is decreased enhancement of the pancreatic parenchyma within the head and body consistent with severe pancreatitis. Multiple cystic areas within the pancreatic head, as well as irregular cystic dilatation of the pancreatic duct are stable. Spleen: Normal in size without focal abnormality. Adrenals/Urinary Tract: Adrenal glands are unremarkable. Kidneys are normal, without renal calculi, focal lesion, or hydronephrosis. Bladder is unremarkable. Stomach/Bowel: No bowel obstruction or ileus. Normal appendix right lower quadrant. No bowel wall thickening or inflammatory change. Lymphatic: No pathologic adenopathy within the abdomen or pelvis. Subcentimeter lymph nodes surrounding the pancreas are likely reactive. Reproductive: Stable uterine fibroid.  No adnexal masses. Other: Trace free fluid within the pelvis. No free intraperitoneal gas. No abdominal wall hernia. Musculoskeletal: No acute or destructive bony lesions. Reconstructed images demonstrate no additional findings. Review of the MIP images confirms the above findings. IMPRESSION: Vascular: 1. Stable type B thoracoabdominal aortic dissection, extending from the distal aortic arch through the right common iliac artery bifurcation. There has been no change in the size, extent, or configuration of the dissection since prior exam 07/04/2022. 2. 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 This was discussed on CT report yesterday. 3. Stable 1 cm splenic artery aneurysm. 4. Stable 3.5 cm abdominal aortic aneurysm. Recommend follow-up every 2 years. Nonvascular: 1. Interval decreased enhancement of the pancreatic parenchyma within the head and body, with progressive peripancreatic fat stranding, consistent with progressive severe acute pancreatitis. Multiple  cystic areas within the pancreatic parenchyma and cystic dilatation of the pancreatic duct are unchanged since previous exams. 2. No evidence of pulmonary embolus. 3. Trace pelvic free fluid, likely reactive. Electronically Signed   By: Randa Ngo M.D.   On: 10/31/2022 19:48   CT ABDOMEN PELVIS W CONTRAST  Result Date: 10/30/2022 CLINICAL DATA:  Abdominal pain, blood in stool, known thoracic aortic dissection EXAM: CT ABDOMEN AND PELVIS WITH CONTRAST TECHNIQUE: Multidetector CT imaging of the abdomen and pelvis was performed using the standard protocol following bolus administration of intravenous contrast. RADIATION DOSE REDUCTION: This exam was performed according to the departmental dose-optimization program which includes automated exposure control, adjustment of the mA and/or kV according to patient size and/or use of iterative reconstruction technique. CONTRAST:  35m OMNIPAQUE IOHEXOL 350 MG/ML SOLN COMPARISON:  10/28/2022, 07/30/2022 FINDINGS: Lower chest: No acute pleural or parenchymal lung disease. Hepatobiliary: No focal liver abnormality is seen. No gallstones, gallbladder wall thickening, or biliary dilatation. Pancreas: Inflammatory changes are seen surrounding the pancreas, with interstitial edema again seen within the pancreatic head and body. Findings are consistent with acute pancreatitis. Stable multiple small pancreatic cysts and cystic dilatation of  the pancreatic duct. No evidence of abscess. Spleen: Normal in size without focal abnormality. Adrenals/Urinary Tract: Adrenal glands are unremarkable. Kidneys are normal, without renal calculi, focal lesion, or hydronephrosis. Bladder is unremarkable. Stomach/Bowel: No bowel obstruction or ileus. Normal appendix right lower quadrant. No bowel wall thickening or inflammatory change. Vascular/Lymphatic: Stable dissection of the descending thoracic aorta and abdominal aorta, extending into the right common iliac artery. 3.5 cm proximal  abdominal aortic aneurysm at the level of the diaphragmatic hiatus. No change since prior exam. Numerous venous collaterals are seen within the left upper quadrant likely related to extrinsic narrowing of the splenic vein and porta splenic confluence due to inflammatory changes from pancreatitis. No pathologic adenopathy. Reproductive: Exophytic fibroid off the lower uterine segment on the right. Stable appearance of the ovaries. Other: Trace pelvic free fluid. No free intraperitoneal gas. No abdominal wall hernia. Musculoskeletal: No acute or destructive bony lesions. Reconstructed images demonstrate no additional findings. IMPRESSION: 1. Stable acute pancreatitis. Numerous cystic lesions within the pancreas and irregular dilatation of the pancreatic duct are stable as well. 2. Stable type B chronic aortic dissection, extending from the descending thoracic aorta through the right common iliac artery. 3. 3.5 cm abdominal aortic aneurysm. Recommend follow-up every 2 years. Reference: J Am Coll Radiol 3664;40:347-425. 4. 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 Numerous venous collaterals within the left quadrant. Electronically Signed   By: Randa Ngo M.D.   On: 10/30/2022 22:56   CT ANGIO GI BLEED  Addendum Date: 10/28/2022   ADDENDUM REPORT: 10/28/2022 22:47 ADDENDUM: There has been chronic narrowing of the splenic vein on multiple recent priors with collateral vessels in the left upper quadrant. Nonvisualized short segment of splenic vein on series 13, image 23 but some motion degradation could reflect more chronic string like narrowing of splenic vein versus short segment occlusion in the region of chronically narrowed splenic vein. Electronically Signed   By: Donavan Foil M.D.   On: 10/28/2022 22:47   Result Date: 10/28/2022 CLINICAL DATA:  Nausea vomiting EXAM: CTA ABDOMEN AND PELVIS WITHOUT AND WITH CONTRAST TECHNIQUE:  Multidetector CT imaging of the abdomen and pelvis was performed using the standard protocol during bolus administration of intravenous contrast. Multiplanar reconstructed images and MIPs were obtained and reviewed to evaluate the vascular anatomy. RADIATION DOSE REDUCTION: This exam was performed according to the departmental dose-optimization program which includes automated exposure control, adjustment of the mA and/or kV according to patient size and/or use of iterative reconstruction technique. CONTRAST:  139m OMNIPAQUE IOHEXOL 350 MG/ML SOLN COMPARISON:  CT 07/30/2022, 07/04/2022, 06/06/2022, 08/18/2020, ultrasound pelvis 05/29/2020, MRI 07/15/2022 FINDINGS: VASCULAR Aorta: Redemonstrated type B dissection with stable aneurysmal dilatation of the proximal abdominal aorta up to 3.5 cm. No stenosis or occlusion. Celiac: Arises from the true lumen. Patent and without dissection occlusion aneurysm or stenosis SMA: Patent without evidence of aneurysm, dissection, vasculitis or significant stenosis. Arises from the true lumen Renals: Both renal arteries are patent without evidence of aneurysm, dissection, vasculitis, fibromuscular dysplasia or significant stenosis. Two right renal arteries arise from the false lumen. Two left renal arteries arise from the true lumen. IMA: Patent without evidence of aneurysm, dissection, vasculitis or significant stenosis. Arises from the true lumen. Inflow: Dissection extends into the right common iliac artery to the level of the bifurcation. Negative for stenosis or occlusion. 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 Proximal Outflow: Bilateral common  femoral and visualized portions of the superficial and profunda femoral arteries are patent without evidence of aneurysm, dissection, vasculitis or significant stenosis. Veins: Probable occlusion of the splenic vein, series 13, image 22. Multiple collateral vessels in the left upper  quadrant. Review of the MIP images confirms the above findings. NON-VASCULAR Lower chest: Lung bases demonstrate no acute airspace disease. Hepatobiliary: No focal hepatic abnormality. Fluid surrounding the gallbladder. No calcified stones. No biliary dilatation. Pancreas: Peripancreatic fluid and stranding, greatest about the head of pancreas. Multiple low-density cystic lesions in the pancreas. Spleen: Normal in size without focal abnormality. Adrenals/Urinary Tract: Adrenal glands are unremarkable. Kidneys are normal, without renal calculi, focal lesion, or hydronephrosis. Bladder is unremarkable. Stomach/Bowel: Stomach is within normal limits. Appendix appears normal. No evidence of bowel wall thickening, distention, or inflammatory changes. Negative for extravasation into the bowel to suggest acute GI bleeding at this time. Lymphatic: No suspicious lymph nodes. Subcentimeter retroperitoneal nodes. Reproductive: Lobulated mass in the right pelvis presumably corresponds to lower uterine segment fibroid. No adnexal mass. Other: No free air.  Small volume free fluid in the pelvis. Musculoskeletal: No acute or significant osseous findings. IMPRESSION: VASCULAR 1. Negative for acute GI bleeding at this time. 2. Similar appearance of chronic type B aortic dissection with mild aneurysmal dilatation of the proximal abdominal aorta up to 3.5 cm. 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 3. Findings suspicious for splenic vein occlusion. Multiple collateral vessels in the left upper quadrant. NON-VASCULAR 1. Fluid and probable inflammatory change surrounding the gallbladder 2. Peripancreatic stranding with heterogeneous pancreatic enhancement likely due to pancreatitis. Diffuse pancreatic ductal dilatation with multiple small cysts/pseudocysts. 3.  Fibroid uterus Electronically Signed: By: Donavan Foil M.D.  On: 10/28/2022 19:50   US Abdomen Limited RUQ (LIVER/GB)  Result Date: 10/28/2022 CLINICAL DATA:  Chronic right upper quadrant abdominal pain EXAM: ULTRASOUND ABDOMEN LIMITED RIGHT UPPER QUADRANT COMPARISON:  CTA abdomen/pelvis dated 10/28/2022. CT abdomen/pelvis dated 07/30/2022. FINDINGS: Gallbladder: No gallstones or gallbladder wall thickening. However, there is pericholecystic fluid. When correlating with prior CT, this is likely secondary to the patient's acute pancreatitis. Common bile duct: Diameter: 10 mm, unchanged.  No intrahepatic ductal dilatation. Liver: No focal lesion identified. Within normal limits in parenchymal echogenicity. Portal vein is patent on color Doppler imaging with normal direction of blood flow towards the liver. Other: None. IMPRESSION: Pericholecystic fluid, likely secondary to the patient's acute pancreatitis. No cholelithiasis. Dilated common duct, measuring 10 mm, unchanged. Electronically Signed   By: Julian Hy M.D.   On: 10/28/2022 21:29     Discharge Exam: Vitals:   11/03/22 0443 11/03/22 0805  BP: 110/71 110/76  Pulse:  82  Resp: 11 14  Temp: 98.2 F (36.8 C) 97.8 F (36.6 C)  SpO2: 95% 98%   Vitals:   11/03/22 0035 11/03/22 0443 11/03/22 0500 11/03/22 0805  BP: 115/83 110/71  110/76  Pulse:    82  Resp: '13 11  14  '$ Temp: 97.8 F (36.6 C) 98.2 F (36.8 C)  97.8 F (36.6 C)  TempSrc: Oral Oral  Oral  SpO2: 95% 95%  98%  Weight:   76.2 kg   Height:        General: Pt is alert, awake, not in acute distress Cardiovascular: RRR, S1/S2 +, no rubs, no gallops Respiratory: CTA bilaterally, no wheezing, no rhonchi Abdominal: Soft, epigastric and right upper quadrant tenderness, ND, bowel sounds +  Extremities: no edema, no cyanosis    The results of significant diagnostics from this hospitalization (including imaging, microbiology, ancillary and laboratory) are listed below for reference.     Microbiology: Recent Results (from the  past 240 hour(s))  MRSA Next Gen by PCR, Nasal     Status: Abnormal   Collection Time: 10/31/22 11:50 PM  Result Value Ref Range Status   MRSA by PCR Next Gen NEGATIVE (A) NOT DETECTED Final    Comment:        The GeneXpert MRSA Assay (FDA approved for NASAL specimens only), is one component of a comprehensive MRSA colonization surveillance program. It is not intended to diagnose MRSA infection nor to guide or monitor treatment for MRSA infections. Performed at [facility], [place] [address], [place], [place] [address]      Labs: BNP (last 3 results) Recent Labs    10/31/22 2330  BNP 4.6   Basic Metabolic Panel: Recent Labs  Lab 10/30/22 1755 10/31/22 1524 10/31/22 1835 10/31/22 2330 11/02/22 0646 11/03/22 0013  NA 139 137  --  135 138 139  K 3.7 3.6  --  3.2* 4.0 3.6  CL 105 105  --  110 112* 107  CO2 19* 18*  --  16* 20* 20*  GLUCOSE 85 101*  --  98 94 109*  BUN <5* 5*  --  <5* <5* 6  CREATININE 0.72 0.84  --  0.61 0.70 0.66  CALCIUM 9.7 9.4  --  8.7* 8.7* 8.8*  MG  --   --  1.7 2.3 1.7  --   PHOS  --   --   --  2.8  --   --    Liver Function Tests: Recent Labs  Lab 10/28/22 1423 10/30/22 1755 10/31/22 1524 10/31/22 2330  AST 13* 14* 14* 10*  ALT '14 13 13 12  '$ ALKPHOS 50 55 55 43  BILITOT 1.2 1.4* 1.5* 1.2  PROT 7.5 8.0 7.2 5.8*  ALBUMIN 3.9 4.0 3.9 3.1*   Recent Labs  Lab 10/28/22 1423 10/30/22 1755 10/31/22 1524 10/31/22 2330  LIPASE 98* 89* 118* 90*   No results for input(s): "AMMONIA" in the last 168 hours. CBC: Recent Labs  Lab 10/28/22 1423 10/30/22 1755 10/31/22 1524 10/31/22 2330 11/02/22 0646 11/03/22 0013  WBC 7.2 8.3 8.0 7.8 3.7* 2.9*  NEUTROABS 5.5 6.5  --   --   --   --   HGB 12.5 13.8 12.9 11.0* 9.7* 9.4*  HCT 39.4 40.2 38.9 33.2* 30.0* 28.8*  MCV 95.4 92.2 93.3 94.3 97.4 96.0  PLT 279 279 243 241 160 147*   Cardiac Enzymes: No results for input(s): "CKTOTAL", "CKMB", "CKMBINDEX", "TROPONINI" in the last 168  hours. BNP: Invalid input(s): "POCBNP" CBG: Recent Labs  Lab 11/01/22 0329 11/01/22 0729 11/01/22 1103 11/01/22 1517 11/01/22 1911  GLUCAP 99 81 106* 117* 120*   D-Dimer No results for input(s): "DDIMER" in the last 72 hours. Hgb A1c No results for input(s): "HGBA1C" in the last 72 hours. Lipid Profile No results for input(s): "CHOL", "HDL", "LDLCALC", "TRIG", "CHOLHDL", "LDLDIRECT" in the last 72 hours. Thyroid function studies No results for input(s): "TSH", "T4TOTAL", "T3FREE", "THYROIDAB" in the last 72 hours.  Invalid input(s): "FREET3" Anemia work up No results for input(s): "VITAMINB12", "FOLATE", "FERRITIN", "TIBC", "IRON", "RETICCTPCT" in the last 72 hours. Urinalysis    Component Value Date/Time   COLORURINE YELLOW 10/31/2022 [place] (A) 10/31/2022 1835   LABSPEC 1.019 10/31/2022 1835  PHURINE 6.0 10/31/2022 1835   GLUCOSEU NEGATIVE 10/31/2022 1835   HGBUR NEGATIVE 10/31/2022 [place] 10/31/2022 1835   KETONESUR 80 (A) 10/31/2022 1835   PROTEINUR 30 (A) 10/31/2022 1835   UROBILINOGEN 1.0 04/13/2014 1138   NITRITE NEGATIVE 10/31/2022 1835   LEUKOCYTESUR NEGATIVE 10/31/2022 1835   Sepsis Labs Recent Labs  Lab 10/31/22 1524 10/31/22 2330 11/02/22 0646 11/03/22 0013  WBC 8.0 7.8 3.7* 2.9*   Microbiology Recent Results (from the past 240 hour(s))  MRSA Next Gen by PCR, Nasal     Status: Abnormal   Collection Time: 10/31/22 11:50 PM  Result Value Ref Range Status   MRSA by PCR Next Gen NEGATIVE (A) NOT DETECTED Final    Comment:        The GeneXpert MRSA Assay (FDA approved for NASAL specimens only), is one component of a comprehensive MRSA colonization surveillance program. It is not intended to diagnose MRSA infection nor to guide or monitor treatment for MRSA infections. Performed at [facility], [place] [address], [place], [place] [address]      Time coordinating discharge: Over 30  minutes  SIGNED:   Darliss Cheney, MD  [facility] 11/03/2022, 9:48 AM *Please note that this is a verbal dictation therefore any spelling or grammatical errors are due to the "[place] [employer]" system interpretation. If 7PM-7AM, please contact night-coverage [email or web address]

## 2022-12-07 ENCOUNTER — Other Ambulatory Visit: Payer: Self-pay

## 2022-12-07 ENCOUNTER — Emergency Department (HOSPITAL_COMMUNITY)
Admission: EM | Admit: 2022-12-07 | Discharge: 2022-12-08 | Disposition: A | Discharge status: Home / Self Care | Payer: 59 | Attending: Emergency Medicine | Admitting: Emergency Medicine

## 2022-12-07 ENCOUNTER — Emergency Department (HOSPITAL_COMMUNITY): Payer: 59

## 2022-12-07 ENCOUNTER — Encounter (HOSPITAL_COMMUNITY): Payer: Self-pay | Admitting: Emergency Medicine

## 2022-12-07 DIAGNOSIS — R112 Nausea with vomiting, unspecified: Secondary | ICD-10-CM | POA: Insufficient documentation

## 2022-12-07 DIAGNOSIS — R1013 Epigastric pain: Secondary | ICD-10-CM | POA: Diagnosis not present

## 2022-12-07 LAB — CBC WITH DIFFERENTIAL/PLATELET
Abs Immature Granulocytes: 0.02 10*3/uL (ref 0.00–0.07)
Basophils Absolute: 0 10*3/uL (ref 0.0–0.1)
Basophils Relative: 0 %
Eosinophils Absolute: 0 10*3/uL (ref 0.0–0.5)
Eosinophils Relative: 1 %
HCT: 39 % (ref 36.0–46.0)
Hemoglobin: 12.6 g/dL (ref 12.0–15.0)
Immature Granulocytes: 0 %
Lymphocytes Relative: 15 %
Lymphs Abs: 1 10*3/uL (ref 0.7–4.0)
MCH: 31 pg (ref 26.0–34.0)
MCHC: 32.3 g/dL (ref 30.0–36.0)
MCV: 96.1 fL (ref 80.0–100.0)
Monocytes Absolute: 0.4 10*3/uL (ref 0.1–1.0)
Monocytes Relative: 6 %
Neutro Abs: 5.3 10*3/uL (ref 1.7–7.7)
Neutrophils Relative %: 78 %
Platelets: 261 10*3/uL (ref 150–400)
RBC: 4.06 MIL/uL (ref 3.87–5.11)
RDW: 12.7 % (ref 11.5–15.5)
WBC: 6.8 10*3/uL (ref 4.0–10.5)
nRBC: 0 % (ref 0.0–0.2)

## 2022-12-07 LAB — COMPREHENSIVE METABOLIC PANEL
ALT: 10 U/L (ref 0–44)
AST: 13 U/L — ABNORMAL LOW (ref 15–41)
Albumin: 4 g/dL (ref 3.5–5.0)
Alkaline Phosphatase: 52 U/L (ref 38–126)
Anion gap: 12 (ref 5–15)
BUN: 7 mg/dL (ref 6–20)
CO2: 21 mmol/L — ABNORMAL LOW (ref 22–32)
Calcium: 9.4 mg/dL (ref 8.9–10.3)
Chloride: 102 mmol/L (ref 98–111)
Creatinine, Ser: 0.82 mg/dL (ref 0.44–1.00)
GFR, Estimated: >60 mL/min (ref >60)
Glucose, Bld: 103 mg/dL — ABNORMAL HIGH (ref 70–99)
Potassium: 3.1 mmol/L — ABNORMAL LOW (ref 3.5–5.1)
Sodium: 135 mmol/L (ref 135–145)
Total Bilirubin: 1.6 mg/dL — ABNORMAL HIGH (ref 0.3–1.2)
Total Protein: 7.4 g/dL (ref 6.5–8.1)

## 2022-12-07 LAB — LIPASE, BLOOD: Lipase: 56 U/L — ABNORMAL HIGH (ref 11–51)

## 2022-12-07 LAB — I-STAT BETA HCG BLOOD, ED (MC, WL, AP ONLY): I-stat hCG, quantitative: <5 m[IU]/mL (ref <5)

## 2022-12-07 MED ORDER — HYDROMORPHONE HCL 1 MG/ML IJ SOLN
1.0000 mg | Freq: Once | INTRAMUSCULAR | Status: AC
  Administered 2022-12-07: 1 mg via INTRAVENOUS
  Filled 2022-12-07: qty 1

## 2022-12-07 MED ORDER — IOHEXOL 350 MG/ML SOLN
75.0000 mL | Freq: Once | INTRAVENOUS | Status: AC | PRN
  Administered 2022-12-07: 75 mL via INTRAVENOUS

## 2022-12-07 MED ORDER — SODIUM CHLORIDE 0.9 % IV SOLN: 1000.0000 mL | Freq: Once | INTRAVENOUS | Status: DC

## 2022-12-07 MED ORDER — SODIUM CHLORIDE 0.9 % IV BOLUS
500.0000 mL | Freq: Once | INTRAVENOUS | Status: AC
  Administered 2022-12-08: 500 mL via INTRAVENOUS

## 2022-12-07 MED ORDER — ONDANSETRON HCL 4 MG/2ML IJ SOLN
4.0000 mg | Freq: Once | INTRAMUSCULAR | Status: AC
  Administered 2022-12-07: 4 mg via INTRAVENOUS
  Filled 2022-12-07: qty 2

## 2022-12-07 MED ORDER — SODIUM CHLORIDE 0.9 % IV BOLUS
1000.0000 mL | Freq: Once | INTRAVENOUS | Status: AC
  Administered 2022-12-07: 1000 mL via INTRAVENOUS

## 2022-12-07 NOTE — ED Provider Notes (Signed)
[facility] EMERGENCY DEPARTMENT Provider Note   CSN: [id number] Arrival date & time: 12/07/22  1650     History {Add pertinent medical, surgical, social history, OB history to HPI:1} Chief Complaint  Patient presents with   Abdominal Pain    Robin Guerrero is a 36 y.o. female, history of chronic pancreatitis, who presents to the ED secondary to worsening epigastric pain has been going on for the last day.  She states that she has chronic pancreatitis and has daily vomiting, for the last day the pain has been intractable and severe.  She states her vomiting, has not been different than usual and there has been no blood in her vomit.  Denies any chest pain or shortness of breath.  States that this feels like one of her typical flares.  Per patient she does not drink any alcohol and she is not sure why she has pancreatitis all the time.     Home Medications Prior to Admission medications   Medication Sig Start Date End Date Taking? Authorizing Provider  acetaminophen (TYLENOL) 500 MG tablet Take 1,000 mg by mouth every 6 (six) hours as needed for moderate pain.    [provider]  amLODipine (NORVASC) 10 MG tablet Take 1 tablet (10 mg total) by mouth daily. 03/08/22   Patwardhan, Reynold Bowen, MD  Bismuth/Metronidaz/Tetracyclin [phone] MG CAPS Take 3 capsules by mouth 4 (four) times daily. Patient not taking: Reported on 10/31/2022 09/26/22   [provider]  cloNIDine (CATAPRES) 0.2 MG tablet Take 1 tablet (0.2 mg total) by mouth every 8 (eight) hours. 03/08/22 06/12/23  Patwardhan, Reynold Bowen, MD  hydrALAZINE (APRESOLINE) 100 MG tablet TAKE 1 TABLET BY MOUTH 3 TIMES DAILY. 08/20/22   Patwardhan, Manish J, MD  losartan (COZAAR) 100 MG tablet Take 1 tablet (100 mg total) by mouth daily. 06/08/22 10/31/22  Barb Merino, MD  metoCLOPramide (REGLAN) 10 MG tablet Take 1 tablet (10 mg total) by mouth every 6 (six) hours. 10/28/22   Fransico Meadow, MD   Multiple Vitamins-Minerals (MULTIVITAMIN WITH MINERALS) tablet Take 1 tablet by mouth daily.    [provider]  ondansetron (ZOFRAN) 4 MG tablet Take 1 tablet (4 mg total) by mouth every 8 (eight) hours as needed for nausea or vomiting. 06/08/22   Barb Merino, MD  oxyCODONE (ROXICODONE) 5 MG immediate release tablet Take 1 tablet (5 mg total) by mouth every 8 (eight) hours as needed. Patient not taking: Reported on 10/30/2022 09/11/22 09/11/23  Mansouraty, Telford Nab., MD  oxyCODONE-acetaminophen (PERCOCET/ROXICET) 5-325 MG tablet Take 1 tablet by mouth every 6 (six) hours as needed for severe pain. 10/31/22   Sponseller, Rebekah R, PA-C  Pancrelipase, Lip-Prot-Amyl, (ZENPEP) [phone] units CPEP Take 1-2 capsules by mouth See admin instructions. 2 tablets with each meal and 1 tablet with snacks    [provider]  pantoprazole (PROTONIX) 40 MG tablet Take 40 mg by mouth 2 (two) times daily before a meal.    [provider]  promethazine (PHENERGAN) 12.5 MG tablet Take 1 tablet (12.5 mg total) by mouth every 8 (eight) hours as needed for nausea or vomiting. Patient not taking: Reported on 10/30/2022 07/05/22   Mansouraty, Telford Nab., MD  propranolol (INDERAL) 10 MG tablet Take 1 tablet (10 mg total) by mouth 2 (two) times daily. 06/08/22 10/31/22  Barb Merino, MD  vitamin B-12 1000 MCG tablet Take 1 tablet (1,000 mcg total) by mouth daily. 06/15/22   Regalado, Cassie Freer, MD  Allergies    Ativan [lorazepam] and Tape    Review of Systems   Review of Systems  Respiratory:  Negative for shortness of breath.   Cardiovascular:  Negative for chest pain.  Gastrointestinal:  Positive for abdominal pain, nausea and vomiting. Negative for blood in stool.    Physical Exam Updated Vital Signs BP 124/86 (BP Location: Right Arm)   Pulse (!) 131   Temp 98.4 F (36.9 C) (Oral)   Resp 20   SpO2 100%  Physical Exam Vitals and nursing note reviewed.  Constitutional:       General: She is not in acute distress.    Appearance: She is well-developed.  HENT:     Head: Normocephalic and atraumatic.  Eyes:     Conjunctiva/sclera: Conjunctivae normal.  Cardiovascular:     Rate and Rhythm: Normal rate and regular rhythm.     Heart sounds: No murmur heard. Pulmonary:     Effort: Pulmonary effort is normal. No respiratory distress.     Breath sounds: Normal breath sounds.  Abdominal:     Palpations: Abdomen is soft.     Tenderness: There is generalized abdominal tenderness.  Musculoskeletal:        General: No swelling.     Cervical back: Neck supple.  Skin:    General: Skin is warm and dry.     Capillary Refill: Capillary refill takes less than 2 seconds.  Neurological:     Mental Status: She is alert.  Psychiatric:        Mood and Affect: Mood normal.     ED Results / Procedures / Treatments   Labs (all labs ordered are listed, but only abnormal results are displayed) Labs Reviewed  COMPREHENSIVE METABOLIC PANEL - Abnormal; Notable for the following components:      Result Value   Potassium 3.1 (*)    CO2 21 (*)    Glucose, Bld 103 (*)    AST 13 (*)    Total Bilirubin 1.6 (*)    All other components within normal limits  LIPASE, BLOOD - Abnormal; Notable for the following components:   Lipase 56 (*)    All other components within normal limits  CBC WITH DIFFERENTIAL/PLATELET  URINALYSIS, ROUTINE W REFLEX MICROSCOPIC  POC URINE PREG, ED    EKG EKG Interpretation  Date/Time:  Saturday December 07 2022 17:35:16 EST Ventricular Rate:  137 PR Interval:  118 QRS Duration: 66 QT Interval:  360 QTC Calculation: 543 R Axis:   22 Text Interpretation: Sinus tachycardia Low voltage QRS Cannot rule out Anterior infarct , age undetermined Abnormal ECG When compared with ECG of 01-Nov-2022 06:58, PREVIOUS ECG IS PRESENT Rate faster Prolonged QT Confirmed by Ezequiel Essex [phone]) on 12/07/2022 8:59:28 PM  Radiology No results  found.  Procedures Procedures  {Document cardiac monitor, telemetry assessment procedure when appropriate:1}  Medications Ordered in ED Medications  ondansetron (ZOFRAN) injection 4 mg (has no administration in time range)  0.9 %  sodium chloride infusion (has no administration in time range)    ED Course/ Medical Decision Making/ A&P                           Medical Decision Making  ***  {Document critical care time when appropriate:1} {Document review of labs and clinical decision tools ie heart score, Chads2Vasc2 etc:1}  {Document your independent review of radiology images, and any outside records:1} {Document your discussion with family members, caretakers, and with  consultants:1} {Document social determinants of health affecting pt's care:1} {Document your decision making why or why not admission, treatments were needed:1} Final Clinical Impression(s) / ED Diagnoses Final diagnoses:  None    Rx / DC Orders ED Discharge Orders     None

## 2022-12-07 NOTE — ED Triage Notes (Signed)
Patient BIB [facility] from home w/ pancreatitis flare up since yesterday. C/o n/v.   BP:190/100  HR :130 CBG:120

## 2022-12-07 NOTE — ED Provider Triage Note (Signed)
Emergency Medicine Provider Triage Evaluation Note  Robin Guerrero , a 36 y.o. female  was evaluated in triage.  Pt complains of upper abdominal pain, nausea, and vomiting that started last night. > 5 episodes of vomiting today. Hx alcoholic pancreatitis. States she has only had one flare before and that was this past summer. No diarrhea, fever, chills, cough, congestion, chest pain, dysuria, hematuria, pelvic pain, or SOB. Has not been able to tolerate PO today.   Review of Systems  Positive: See HPI Negative: See HPI  Physical Exam  There were no vitals taken for this visit. Gen:   Awake, no distress   Resp:  Normal effort  MSK:   Moves extremities without difficulty  Other:  Mild generalized abdominal tenderness, no CVA tenderness, no rebound or guarding, decreased capillary refill, moderately dry mucus membranes  Medical Decision Making  Medically screening exam initiated at 5:04 PM.  Appropriate orders placed.  Robin Guerrero was informed that the remainder of the evaluation will be completed by another provider, this initial triage assessment does not replace that evaluation, and the importance of remaining in the ED until their evaluation is complete.     Suzzette Righter, PA-C 12/07/22 1705

## 2022-12-08 LAB — URINALYSIS, ROUTINE W REFLEX MICROSCOPIC
Bacteria, UA: NONE SEEN
Bilirubin Urine: NEGATIVE
Glucose, UA: NEGATIVE mg/dL
Hgb urine dipstick: NEGATIVE
Ketones, ur: 20 mg/dL — AB
Leukocytes,Ua: NEGATIVE
Nitrite: NEGATIVE
Protein, ur: 30 mg/dL — AB
Specific Gravity, Urine: >1.046 — ABNORMAL HIGH (ref 1.005–1.030)
pH: 5 (ref 5.0–8.0)

## 2022-12-08 LAB — MAGNESIUM: Magnesium: 1.9 mg/dL (ref 1.7–2.4)

## 2022-12-08 LAB — TROPONIN I (HIGH SENSITIVITY): Troponin I (High Sensitivity): 12 ng/L (ref <18)

## 2022-12-08 MED ORDER — AMLODIPINE BESYLATE 5 MG PO TABS
10.0000 mg | ORAL_TABLET | Freq: Once | ORAL | Status: AC
  Administered 2022-12-08: 10 mg via ORAL
  Filled 2022-12-08: qty 2

## 2022-12-08 MED ORDER — PROPRANOLOL HCL 10 MG PO TABS
10.0000 mg | ORAL_TABLET | Freq: Once | ORAL | Status: AC
  Administered 2022-12-08: 10 mg via ORAL
  Filled 2022-12-08: qty 1

## 2022-12-08 MED ORDER — OXYCODONE-ACETAMINOPHEN 5-325 MG PO TABS: 1.0000 | ORAL_TABLET | Freq: Four times a day (QID) | ORAL | 0 refills | Status: DC | PRN

## 2022-12-08 MED ORDER — KETOROLAC TROMETHAMINE 15 MG/ML IJ SOLN
15.0000 mg | Freq: Once | INTRAMUSCULAR | Status: AC
  Administered 2022-12-08: 15 mg via INTRAVENOUS
  Filled 2022-12-08: qty 1

## 2022-12-08 MED ORDER — POTASSIUM CHLORIDE CRYS ER 20 MEQ PO TBCR
40.0000 meq | EXTENDED_RELEASE_TABLET | Freq: Once | ORAL | Status: AC
  Administered 2022-12-08: 40 meq via ORAL
  Filled 2022-12-08: qty 2

## 2022-12-08 MED ORDER — LOSARTAN POTASSIUM 50 MG PO TABS
100.0000 mg | ORAL_TABLET | Freq: Once | ORAL | Status: AC
  Administered 2022-12-08: 100 mg via ORAL
  Filled 2022-12-08: qty 2

## 2022-12-08 MED ORDER — HYDROMORPHONE HCL 1 MG/ML IJ SOLN
1.0000 mg | Freq: Once | INTRAMUSCULAR | Status: AC
  Administered 2022-12-08: 1 mg via INTRAVENOUS
  Filled 2022-12-08: qty 1

## 2022-12-08 NOTE — ED Provider Notes (Signed)
  Physical Exam  BP (!) 110/93   Pulse 91   Temp 98.2 F (36.8 C) (Oral)   Resp 13   SpO2 98%   Physical Exam  Procedures  Procedures  ED Course / MDM   Clinical Course as of 12/08/22 0453  Sun Dec 08, 2022  0134 Patient reevaluated. Contiues with nausea, however no vomiting. No further antiemetic at this time, given her prolonged Qtc on initial EKG. Troponin negative. Will continue to monitor pressures in context of aneurysms. Will repeat EKG.  [RS]    Clinical Course User Index [RS] Sayvion Vigen, Gypsy Balsam, PA-C   Medical Decision Making Amount and/or Complexity of Data Reviewed Labs: ordered. Radiology: ordered.  Risk Prescription drug management.    Care of this patient assumed from preceding ED provider Fatima Sanger, PA-C at time of shift change. Please see her associated note for further insight into the patient's ED course. In brief, patient with hx of alcoholic pancreatitis with chronic pain and daily nausea and intermittent vomiting. Also has hx of aortic aneurysms, HTN, Presented with epigastric pain and nausea, vomiting today.  CBC unremarkable, CMP with mild elevation in Tbili of 1.6; lipase mildly elevated to 56 decreased from prior.  EKG with sinus tachy with prolonged Qtc to 543. Has been taking home antiemetics. CTA with stable aneurysms, no acute pancreatitis.   Missed home dose HTN meds, need to decrease BP, HR in context of aneurysms. Pending troponin and PO challenge at time of shift change. Home meds ordered.   BP and heart rate improved with home meds.  QTc stable.  Will advise patient to avoid QTc prolonging and nausea medications at home and recommend close outpatient follow-up.  Patient tolerating p.o., significant improvement symptomatology.  No indication for admission to the hospital at this time, clinical concern for emergent underlying etiology or further ED workup is exceedingly low.  Joi  voiced understanding of her medical evaluation and  treatment plan. Each of their questions answered to their expressed satisfaction.  Return precautions were given.  Patient is well-appearing, stable, and was discharged in good condition.  This chart was dictated using voice recognition software, Dragon. Despite the best efforts of this provider to proofread and correct errors, errors may still occur which can change documentation meaning.      Emeline Darling, PA-C 12/08/22 0453    Merrily Pew, MD 12/08/22 0730

## 2022-12-08 NOTE — ED Notes (Signed)
Patient given cup of water per PA request.

## 2022-12-08 NOTE — Discharge Instructions (Addendum)
You were seen in the ER today for your abdominal pain. Your workup was very reassuring. Patient is only related to chronic pancreatitis.  Please avoid taking your Zofran at home for the next few days given the changes on your EKG that we discussed in the emergency department today.  Follow-up closely in the outpatient setting and return to the ER with any new severe symptoms.

## 2022-12-16 ENCOUNTER — Encounter (HOSPITAL_COMMUNITY): Payer: Self-pay

## 2022-12-16 ENCOUNTER — Inpatient Hospital Stay (HOSPITAL_COMMUNITY)
Admission: EM | Admit: 2022-12-16 | Discharge: 2022-12-21 | DRG: 871 | Disposition: A | Discharge status: Home / Self Care | Payer: 59 | Attending: Family Medicine | Admitting: Family Medicine

## 2022-12-16 ENCOUNTER — Other Ambulatory Visit: Payer: Self-pay

## 2022-12-16 ENCOUNTER — Emergency Department (HOSPITAL_COMMUNITY): Payer: 59

## 2022-12-16 DIAGNOSIS — K219 Gastro-esophageal reflux disease without esophagitis: Secondary | ICD-10-CM | POA: Diagnosis present

## 2022-12-16 DIAGNOSIS — I160 Hypertensive urgency: Secondary | ICD-10-CM | POA: Diagnosis present

## 2022-12-16 DIAGNOSIS — Z9109 Other allergy status, other than to drugs and biological substances: Secondary | ICD-10-CM | POA: Diagnosis not present

## 2022-12-16 DIAGNOSIS — F17210 Nicotine dependence, cigarettes, uncomplicated: Secondary | ICD-10-CM | POA: Diagnosis present

## 2022-12-16 DIAGNOSIS — I71012 Dissection of descending thoracic aorta: Secondary | ICD-10-CM | POA: Diagnosis present

## 2022-12-16 DIAGNOSIS — K859 Acute pancreatitis without necrosis or infection, unspecified: Secondary | ICD-10-CM | POA: Diagnosis present

## 2022-12-16 DIAGNOSIS — G8929 Other chronic pain: Secondary | ICD-10-CM | POA: Diagnosis present

## 2022-12-16 DIAGNOSIS — K860 Alcohol-induced chronic pancreatitis: Secondary | ICD-10-CM | POA: Diagnosis present

## 2022-12-16 DIAGNOSIS — I71019 Dissection of thoracic aorta, unspecified: Secondary | ICD-10-CM

## 2022-12-16 DIAGNOSIS — E876 Hypokalemia: Secondary | ICD-10-CM | POA: Diagnosis present

## 2022-12-16 DIAGNOSIS — Y95 Nosocomial condition: Secondary | ICD-10-CM | POA: Diagnosis present

## 2022-12-16 DIAGNOSIS — J189 Pneumonia, unspecified organism: Secondary | ICD-10-CM | POA: Diagnosis present

## 2022-12-16 DIAGNOSIS — Z6825 Body mass index (BMI) 25.0-25.9, adult: Secondary | ICD-10-CM

## 2022-12-16 DIAGNOSIS — E871 Hypo-osmolality and hyponatremia: Secondary | ICD-10-CM | POA: Diagnosis present

## 2022-12-16 DIAGNOSIS — K861 Other chronic pancreatitis: Secondary | ICD-10-CM | POA: Insufficient documentation

## 2022-12-16 DIAGNOSIS — Z8249 Family history of ischemic heart disease and other diseases of the circulatory system: Secondary | ICD-10-CM | POA: Diagnosis not present

## 2022-12-16 DIAGNOSIS — F1021 Alcohol dependence, in remission: Secondary | ICD-10-CM | POA: Diagnosis present

## 2022-12-16 DIAGNOSIS — E079 Disorder of thyroid, unspecified: Secondary | ICD-10-CM | POA: Diagnosis present

## 2022-12-16 DIAGNOSIS — Z79899 Other long term (current) drug therapy: Secondary | ICD-10-CM

## 2022-12-16 DIAGNOSIS — Z20822 Contact with and (suspected) exposure to covid-19: Secondary | ICD-10-CM | POA: Diagnosis present

## 2022-12-16 DIAGNOSIS — A419 Sepsis, unspecified organism: Secondary | ICD-10-CM | POA: Diagnosis present

## 2022-12-16 DIAGNOSIS — R109 Unspecified abdominal pain: Secondary | ICD-10-CM

## 2022-12-16 DIAGNOSIS — I10 Essential (primary) hypertension: Secondary | ICD-10-CM | POA: Diagnosis present

## 2022-12-16 DIAGNOSIS — E43 Unspecified severe protein-calorie malnutrition: Secondary | ICD-10-CM | POA: Insufficient documentation

## 2022-12-16 DIAGNOSIS — R1013 Epigastric pain: Secondary | ICD-10-CM | POA: Diagnosis not present

## 2022-12-16 DIAGNOSIS — Z888 Allergy status to other drugs, medicaments and biological substances status: Secondary | ICD-10-CM | POA: Diagnosis not present

## 2022-12-16 LAB — URINALYSIS, ROUTINE W REFLEX MICROSCOPIC
Bacteria, UA: NONE SEEN
Bilirubin Urine: NEGATIVE
Glucose, UA: NEGATIVE mg/dL
Hgb urine dipstick: NEGATIVE
Ketones, ur: 80 mg/dL — AB
Leukocytes,Ua: NEGATIVE
Nitrite: NEGATIVE
Protein, ur: 30 mg/dL — AB
Specific Gravity, Urine: >1.046 — ABNORMAL HIGH (ref 1.005–1.030)
pH: 6 (ref 5.0–8.0)

## 2022-12-16 LAB — CBC WITH DIFFERENTIAL/PLATELET
Abs Immature Granulocytes: 0.22 10*3/uL — ABNORMAL HIGH (ref 0.00–0.07)
Basophils Absolute: 0.1 10*3/uL (ref 0.0–0.1)
Basophils Relative: 0 %
Eosinophils Absolute: 0 10*3/uL (ref 0.0–0.5)
Eosinophils Relative: 0 %
HCT: 38.9 % (ref 36.0–46.0)
Hemoglobin: 12.8 g/dL (ref 12.0–15.0)
Immature Granulocytes: 2 %
Lymphocytes Relative: 6 %
Lymphs Abs: 0.9 10*3/uL (ref 0.7–4.0)
MCH: 30.8 pg (ref 26.0–34.0)
MCHC: 32.9 g/dL (ref 30.0–36.0)
MCV: 93.5 fL (ref 80.0–100.0)
Monocytes Absolute: 0.7 10*3/uL (ref 0.1–1.0)
Monocytes Relative: 5 %
Neutro Abs: 12.3 10*3/uL — ABNORMAL HIGH (ref 1.7–7.7)
Neutrophils Relative %: 87 %
Platelets: 376 10*3/uL (ref 150–400)
RBC: 4.16 MIL/uL (ref 3.87–5.11)
RDW: 13.2 % (ref 11.5–15.5)
WBC: 14.2 10*3/uL — ABNORMAL HIGH (ref 4.0–10.5)
nRBC: 0 % (ref 0.0–0.2)

## 2022-12-16 LAB — CBC
HCT: 35.2 % — ABNORMAL LOW (ref 36.0–46.0)
Hemoglobin: 11.3 g/dL — ABNORMAL LOW (ref 12.0–15.0)
MCH: 30.5 pg (ref 26.0–34.0)
MCHC: 32.1 g/dL (ref 30.0–36.0)
MCV: 94.9 fL (ref 80.0–100.0)
Platelets: 303 10*3/uL (ref 150–400)
RBC: 3.71 MIL/uL — ABNORMAL LOW (ref 3.87–5.11)
RDW: 13.3 % (ref 11.5–15.5)
WBC: 12.1 10*3/uL — ABNORMAL HIGH (ref 4.0–10.5)
nRBC: 0 % (ref 0.0–0.2)

## 2022-12-16 LAB — COMPREHENSIVE METABOLIC PANEL
ALT: 17 U/L (ref 0–44)
AST: 33 U/L (ref 15–41)
Albumin: 3.6 g/dL (ref 3.5–5.0)
Alkaline Phosphatase: 77 U/L (ref 38–126)
Anion gap: 18 — ABNORMAL HIGH (ref 5–15)
BUN: 9 mg/dL (ref 6–20)
CO2: 16 mmol/L — ABNORMAL LOW (ref 22–32)
Calcium: 9.3 mg/dL (ref 8.9–10.3)
Chloride: 100 mmol/L (ref 98–111)
Creatinine, Ser: 0.72 mg/dL (ref 0.44–1.00)
GFR, Estimated: >60 mL/min (ref >60)
Glucose, Bld: 84 mg/dL (ref 70–99)
Potassium: 4.5 mmol/L (ref 3.5–5.1)
Sodium: 134 mmol/L — ABNORMAL LOW (ref 135–145)
Total Bilirubin: 2.5 mg/dL — ABNORMAL HIGH (ref 0.3–1.2)
Total Protein: 8.2 g/dL — ABNORMAL HIGH (ref 6.5–8.1)

## 2022-12-16 LAB — LIPASE, BLOOD: Lipase: 43 U/L (ref 11–51)

## 2022-12-16 LAB — I-STAT BETA HCG BLOOD, ED (MC, WL, AP ONLY): I-stat hCG, quantitative: <5 m[IU]/mL (ref <5)

## 2022-12-16 LAB — RESP PANEL BY RT-PCR (RSV, FLU A&B, COVID)  RVPGX2
Influenza A by PCR: NEGATIVE
Influenza B by PCR: NEGATIVE
Resp Syncytial Virus by PCR: NEGATIVE
SARS Coronavirus 2 by RT PCR: NEGATIVE

## 2022-12-16 LAB — TSH: TSH: 0.495 u[IU]/mL (ref 0.350–4.500)

## 2022-12-16 LAB — T4, FREE: Free T4: 1.74 ng/dL — ABNORMAL HIGH (ref 0.61–1.12)

## 2022-12-16 MED ORDER — DROPERIDOL 2.5 MG/ML IJ SOLN
1.2500 mg | Freq: Once | INTRAMUSCULAR | Status: AC
  Administered 2022-12-16: 1.25 mg via INTRAMUSCULAR
  Filled 2022-12-16: qty 2

## 2022-12-16 MED ORDER — ENOXAPARIN SODIUM 40 MG/0.4ML IJ SOSY
40.0000 mg | PREFILLED_SYRINGE | Freq: Every day | INTRAMUSCULAR | Status: DC
  Administered 2022-12-16 – 2022-12-20 (×5): 40 mg via SUBCUTANEOUS
  Filled 2022-12-16 (×5): qty 0.4

## 2022-12-16 MED ORDER — HYDROMORPHONE HCL 1 MG/ML IJ SOLN
1.0000 mg | Freq: Once | INTRAMUSCULAR | Status: AC
  Administered 2022-12-16: 1 mg via INTRAVENOUS
  Filled 2022-12-16: qty 1

## 2022-12-16 MED ORDER — NICOTINE 21 MG/24HR TD PT24
21.0000 mg | MEDICATED_PATCH | Freq: Every day | TRANSDERMAL | Status: DC
  Administered 2022-12-17 – 2022-12-21 (×5): 21 mg via TRANSDERMAL
  Filled 2022-12-16 (×5): qty 1

## 2022-12-16 MED ORDER — IOHEXOL 350 MG/ML SOLN
75.0000 mL | Freq: Once | INTRAVENOUS | Status: AC | PRN
  Administered 2022-12-16: 75 mL via INTRAVENOUS

## 2022-12-16 MED ORDER — METRONIDAZOLE 500 MG/100ML IV SOLN
500.0000 mg | Freq: Once | INTRAVENOUS | Status: AC
  Administered 2022-12-16: 500 mg via INTRAVENOUS
  Filled 2022-12-16: qty 100

## 2022-12-16 MED ORDER — HYDROMORPHONE HCL 1 MG/ML IJ SOLN
1.0000 mg | INTRAMUSCULAR | Status: DC | PRN
  Administered 2022-12-16 – 2022-12-21 (×27): 1 mg via INTRAVENOUS
  Filled 2022-12-16 (×28): qty 1

## 2022-12-16 MED ORDER — LACTATED RINGERS IV SOLN: INTRAVENOUS | Status: DC

## 2022-12-16 MED ORDER — ONDANSETRON HCL 4 MG/2ML IJ SOLN
4.0000 mg | Freq: Four times a day (QID) | INTRAMUSCULAR | Status: DC | PRN
  Administered 2022-12-17 – 2022-12-20 (×3): 4 mg via INTRAVENOUS
  Filled 2022-12-16 (×3): qty 2

## 2022-12-16 MED ORDER — ONDANSETRON HCL 4 MG/2ML IJ SOLN
4.0000 mg | Freq: Once | INTRAMUSCULAR | Status: AC
  Administered 2022-12-16: 4 mg via INTRAVENOUS
  Filled 2022-12-16: qty 2

## 2022-12-16 MED ORDER — LABETALOL HCL 5 MG/ML IV SOLN
5.0000 mg | INTRAVENOUS | Status: DC | PRN
  Administered 2022-12-17 – 2022-12-18 (×3): 5 mg via INTRAVENOUS
  Filled 2022-12-16 (×2): qty 4

## 2022-12-16 MED ORDER — SODIUM CHLORIDE 0.9 % IV BOLUS
1000.0000 mL | Freq: Once | INTRAVENOUS | Status: AC
  Administered 2022-12-16: 1000 mL via INTRAVENOUS

## 2022-12-16 MED ORDER — SODIUM CHLORIDE 0.9 % IV SOLN
2.0000 g | Freq: Once | INTRAVENOUS | Status: AC
  Administered 2022-12-16: 2 g via INTRAVENOUS
  Filled 2022-12-16: qty 20

## 2022-12-16 MED ORDER — DEXTROSE IN LACTATED RINGERS 5 % IV SOLN: INTRAVENOUS | Status: DC

## 2022-12-16 MED ORDER — ONDANSETRON HCL 4 MG PO TABS
4.0000 mg | ORAL_TABLET | Freq: Four times a day (QID) | ORAL | Status: DC | PRN
  Administered 2022-12-19 – 2022-12-20 (×3): 4 mg via ORAL
  Filled 2022-12-16 (×3): qty 1

## 2022-12-16 MED ORDER — HYDROMORPHONE HCL 1 MG/ML IJ SOLN
0.5000 mg | Freq: Once | INTRAMUSCULAR | Status: AC
  Administered 2022-12-16: 0.5 mg via INTRAVENOUS
  Filled 2022-12-16: qty 1

## 2022-12-16 MED ORDER — SODIUM CHLORIDE 0.9 % IV SOLN
2.0000 g | Freq: Three times a day (TID) | INTRAVENOUS | Status: DC
  Administered 2022-12-16 – 2022-12-21 (×14): 2 g via INTRAVENOUS
  Filled 2022-12-16 (×14): qty 12.5

## 2022-12-16 NOTE — Progress Notes (Signed)
Pharmacy Antibiotic Note  Robin Guerrero is a 37 y.o. female admitted on 12/16/2022 with pneumonia.  Pharmacy has been consulted for Cefepime dosing.  Plan: Cefepime 2gm IV q8h Will f/u renal function, micro data,and pt's clinical condition  Height: '5\' 8"'$  (172.7 cm) Weight: 75.8 kg (167 lb) IBW/kg (Calculated) : 63.9  Temp (24hrs), Avg:98.6 F (37 C), Min:97.6 F (36.4 C), Max:99.5 F (37.5 C)  Recent Labs  Lab 12/16/22 1623  WBC 14.2*  CREATININE 0.72    Estimated Creatinine Clearance: 98.1 mL/min (by C-G formula based on SCr of 0.72 mg/dL).    Allergies  Allergen Reactions   Ativan [Lorazepam] Other (See Comments)    Disorientated, combative   Tape Rash    Clear adhesive tape    Antimicrobials this admission: 1/1 Rocephin x 1 1/1 Flagyl x 1 1/1 Cefepime >>   Microbiology results: Pending  Thank you for allowing pharmacy to be a part of this patient's care.  Sherlon Handing, PharmD, BCPS Please see amion for complete clinical pharmacist phone list 12/16/2022 10:23 PM

## 2022-12-16 NOTE — ED Triage Notes (Addendum)
Reports stent placed in pancreas on 12/26 due to chronic pancreatitis.  Reports pain is more severe along with back pain and n/v.  Called on call MD and was told to come to ER. 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  Patient is tachy and hypertensive in tirage.

## 2022-12-16 NOTE — ED Notes (Signed)
Unable to obtain blood work, antibiotics started

## 2022-12-16 NOTE — H&P (Signed)
History and Physical    Patient: Robin Guerrero ION:[id number] DOB: [birth year] DOA: 12/16/2022 DOS: the patient was seen and examined on 12/16/2022 PCP: Patient, No Pcp Per  Patient coming from: Home  Chief Complaint:  Chief Complaint  Patient presents with   Abdominal Pain   HPI: Robin Guerrero is a 37 y.o. female with medical history significant of alcoholic pancreatitis, chronic recurrent pancreatitis with pseudocyst status post stent placement last week at [facility], stable descending thoracic dissection, essential hypertension who presents to the ER with nausea and abdominal pain rated as 10 out of 10.  Pain is centrally located radiating to the epigastric region.  Associated with nausea and not relieved by anything.  Pain is worse with food.  Patient was seen and evaluated in the ER.  She is seen bending over and apparent severe pain.  Her workup shows normal lipase but CT abdomen pelvis showed acute on chronic pancreatitis with possible necrosis involving the body and tail of the pancreas.  Also questionable bilateral lower lobe infiltrates.  Patient is being admitted mainly for pain control.  No fever or chills.  She has significant leukocytosis.  Otherwise no other significant findings.  Patient has met SIRS criteria with tachycardia heart rate up to 140s leukocytosis as well as possible pneumonia indicating sepsis.  Review of Systems: As mentioned in the history of present illness. All other systems reviewed and are negative. Past Medical History:  Diagnosis Date   Descending thoracic dissection ([place])    History of anemia    no current med.   Hypertension    states under control with med., has been on med. x 3 yr.   Pancreatitis    Past Surgical History:  Procedure Laterality Date   BIOPSY  09/11/2022   Procedure: BIOPSY;  Surgeon: Rush Landmark Telford Nab., MD;  Location: [facility];  Service: Gastroenterology;;   ESOPHAGOGASTRODUODENOSCOPY N/A 06/06/2022   Procedure:  ESOPHAGOGASTRODUODENOSCOPY (EGD);  Surgeon: Arta Silence, MD;  Location: [facility];  Service: Gastroenterology;  Laterality: N/A;   ESOPHAGOGASTRODUODENOSCOPY N/A 06/14/2022   Procedure: ESOPHAGOGASTRODUODENOSCOPY (EGD);  Surgeon: [name], MD;  Location: [facility];  Service: Gastroenterology;  Laterality: N/A;   ESOPHAGOGASTRODUODENOSCOPY (EGD) WITH PROPOFOL N/A 08/01/2022   Procedure: ESOPHAGOGASTRODUODENOSCOPY (EGD) WITH PROPOFOL;  Surgeon: Clarene Essex, MD;  Location: [facility];  Service: Gastroenterology;  Laterality: N/A;   ESOPHAGOGASTRODUODENOSCOPY (EGD) WITH PROPOFOL N/A 09/11/2022   Procedure: ESOPHAGOGASTRODUODENOSCOPY (EGD) WITH PROPOFOL;  Surgeon: Rush Landmark Telford Nab., MD;  Location: [facility];  Service: Gastroenterology;  Laterality: N/A;   EUS N/A 09/11/2022   Procedure: UPPER ENDOSCOPIC ULTRASOUND (EUS) RADIAL;  Surgeon: Irving Copas., MD;  Location: [facility];  Service: Gastroenterology;  Laterality: N/A;   FOREIGN BODY REMOVAL Left 02/10/2015   Procedure: ATTEMPTED EXPLANTATION OF BIRTH CONTROL DEVICE LEFT ARM;  Surgeon: Johnathan Hausen, MD;  Location: [facility];  Service: General;  Laterality: Left;   FOREIGN BODY REMOVAL Left 05/12/2015   Procedure: REMOVAL OF LEFT ARM BIRTH CONTROL DEVICE;  Surgeon: Johnathan Hausen, MD;  Location: [facility];  Service: General;  Laterality: Left;   NEUROLYTIC CELIAC PLEXUS N/A 09/11/2022   Procedure: NEUROLYTIC CELIAC PLEXUS;  Surgeon: Irving Copas., MD;  Location: [facility];  Service: Gastroenterology;  Laterality: N/A;   SUBMANDIBULAR GLAND EXCISION Right 05/01/2009   Social History:  reports that she has been smoking cigarettes. She has a 0.60 pack-year smoking history. She has never used smokeless tobacco. She reports that she does not currently use alcohol.  She reports that she does not use drugs.  Allergies  Allergen Reactions   Ativan [Lorazepam] Other (See  Comments)    Disorientated, combative   Tape Rash    Clear adhesive tape    Family History  Problem Relation Age of Onset   Breast cancer Mother 35   Heart disease Mother    Hypertension Mother    Colon cancer Neg Hx    Esophageal cancer Neg Hx    Inflammatory bowel disease Neg Hx    Liver disease Neg Hx    Pancreatic cancer Neg Hx    Rectal cancer Neg Hx    Stomach cancer Neg Hx     Prior to Admission medications   Medication Sig Start Date End Date Taking? Authorizing Provider  acetaminophen (TYLENOL) 500 MG tablet Take 1,000 mg by mouth every 6 (six) hours as needed for moderate pain.    [provider]  amLODipine (NORVASC) 10 MG tablet Take 1 tablet (10 mg total) by mouth daily. 03/08/22   Patwardhan, Reynold Bowen, MD  Bismuth/Metronidaz/Tetracyclin [phone] MG CAPS Take 3 capsules by mouth 4 (four) times daily. Patient not taking: Reported on 10/31/2022 09/26/22   [provider]  cloNIDine (CATAPRES) 0.2 MG tablet Take 1 tablet (0.2 mg total) by mouth every 8 (eight) hours. 03/08/22 06/12/23  Patwardhan, Reynold Bowen, MD  hydrALAZINE (APRESOLINE) 100 MG tablet TAKE 1 TABLET BY MOUTH 3 TIMES DAILY. 08/20/22   Patwardhan, Manish J, MD  losartan (COZAAR) 100 MG tablet Take 1 tablet (100 mg total) by mouth daily. 06/08/22 10/31/22  Barb Merino, MD  metoCLOPramide (REGLAN) 10 MG tablet Take 1 tablet (10 mg total) by mouth every 6 (six) hours. 10/28/22   Fransico Meadow, MD  Multiple Vitamins-Minerals (MULTIVITAMIN WITH MINERALS) tablet Take 1 tablet by mouth daily.    [provider]  ondansetron (ZOFRAN) 4 MG tablet Take 1 tablet (4 mg total) by mouth every 8 (eight) hours as needed for nausea or vomiting. 06/08/22   Barb Merino, MD  oxyCODONE (ROXICODONE) 5 MG immediate release tablet Take 1 tablet (5 mg total) by mouth every 8 (eight) hours as needed. Patient not taking: Reported on 10/30/2022 09/11/22 09/11/23  Mansouraty, Telford Nab., MD   oxyCODONE-acetaminophen (PERCOCET/ROXICET) 5-325 MG tablet Take 1 tablet by mouth every 6 (six) hours as needed for severe pain. 12/08/22   Sponseller, Rebekah R, PA-C  Pancrelipase, Lip-Prot-Amyl, (ZENPEP) [phone] units CPEP Take 1-2 capsules by mouth See admin instructions. 2 tablets with each meal and 1 tablet with snacks    [provider]  pantoprazole (PROTONIX) 40 MG tablet Take 40 mg by mouth 2 (two) times daily before a meal.    [provider]  promethazine (PHENERGAN) 12.5 MG tablet Take 1 tablet (12.5 mg total) by mouth every 8 (eight) hours as needed for nausea or vomiting. Patient not taking: Reported on 10/30/2022 07/05/22   Mansouraty, Telford Nab., MD  propranolol (INDERAL) 10 MG tablet Take 1 tablet (10 mg total) by mouth 2 (two) times daily. 06/08/22 10/31/22  Barb Merino, MD  vitamin B-12 1000 MCG tablet Take 1 tablet (1,000 mcg total) by mouth daily. 06/15/22   Elmarie Shiley, MD    Physical Exam: Vitals:   12/16/22 2028 12/16/22 2030 12/16/22 2100 12/16/22 2130  BP:  (!) 165/130 (!) 169/110 (!) 153/117  Pulse:  (!) 133 (!) 135 (!) 145  Resp:  20 20 (!) 22  Temp: 99.5 F (37.5 C)     TempSrc:  Oral     SpO2:  98% 100% 100%  Weight:      Height:       Constitutional: Acutely ill looking in mild distress calm, comfortable Eyes: PERRL, lids and conjunctivae normal ENMT: Mucous membranes are moist. Posterior pharynx clear of any exudate or lesions.Normal dentition.  Neck: normal, supple, no masses, no thyromegaly Respiratory: clear to auscultation bilaterally, no wheezing, no crackles. Normal respiratory effort. No accessory muscle use.  Cardiovascular: Sinus tachycardia, no murmurs / rubs / gallops. No extremity edema. 2+ pedal pulses. No carotid bruits.  Abdomen: Diffuse abdominal tenderness, no masses palpated. No hepatosplenomegaly. Bowel sounds positive.  Musculoskeletal: Good range of motion, no joint swelling or tenderness, Skin: no  rashes, lesions, ulcers. No induration Neurologic: CN 2-12 grossly intact. Sensation intact, DTR normal. Strength 5/5 in all 4.  Psychiatric: Normal judgment and insight. Alert and oriented x 3.  Anxious mood  Data Reviewed:  Sodium 134, potassium 4.5, chloride 100, CO2 of 16, total bilirubin 2.5 the rest of the LFTs within normal.  White count 14.2.  TSH is 0.49 and free T4 of 1.74.  Acute viral screen is negative.  Urinalysis negative.  CT abdomen and pelvis shows acute on chronic pancreatitis with areas of hypoenhancement of the pancreatic body and tail suggestive of necrosis.  Stent of the pancreatic duct without pancreatic ductal dilatation.  There is new small peripancreatic fluid collection between the tail of the pancreas and splenic hilum.  Marked wall edema of the gastric cardia and fundus probably reactive.  Bilateral lower lobes infiltrates possibly infectious versus inflammatory.  Assessment and Plan:  #1 acute on chronic pancreatitis: Patient has had chronic alcoholic pancreatitis.  She denied drinking in the long time.  Appears to have an acute flareup of her pancreatitis at the moment.  Will admit the patient for pain management.  Also aggressive hydration.  She is seem to have necrotic pancreatitis based on the CT.  NPO.  Further pain management.  #2 sepsis: Patient meets sepsis criteria.  I suspect this is source secondary to severe pancreatitis.  Initiate empiric treatment with cefepime.  Pneumonia suspected from the CT which is possible but more likely reactive atelectasis.  She recently had a procedure a week ago so if pneumonia this could be healthcare associated pneumonia.  She is not hypoxic.  Continue with the sepsis protocol including blood cultures and lactate.  #3 history of alcoholism: Patient denies any recent alcohol intake.  We will watch closely.  If any signs of withdrawal initiate CIWA protocol.  #4 dysthyroidism: Patient has normal TSH borderline mildly elevated  T4.  Will need close monitoring to rule out hypothyroidism.  #5 chronic pain: Patient appears to have some narcotic seeking.  We will treat currently with Dilaudid IV until pain subsides.  Keeping her n.p.o.  May need to add Toradol as tolerated.  #6 history of aortic dissection: Appears to be stable no change.  #7 tobacco abuse: Nicotine patch    Advance Care Planning:   Code Status: Full Code full code  Consults: None  Family Communication: No family at bedside  Severity of Illness: The appropriate patient status for this patient is INPATIENT. Inpatient status is judged to be reasonable and necessary in order to provide the required intensity of service to ensure the patient's safety. The patient's presenting symptoms, physical exam findings, and initial radiographic and laboratory data in the context of their chronic comorbidities is felt to place them at high risk for further clinical deterioration.  Furthermore, it is not anticipated that the patient will be medically stable for discharge from the hospital within 2 midnights of admission.   * I certify that at the point of admission it is my clinical judgment that the patient will require inpatient hospital care spanning beyond 2 midnights from the point of admission due to high intensity of service, high risk for further deterioration and high frequency of surveillance required.*  AuthorBarbette Merino, MD 12/16/2022 10:10 PM  For on call review [email or web address].

## 2022-12-16 NOTE — ED Provider Notes (Signed)
[facility] EMERGENCY DEPARTMENT Provider Note  CSN: [id number] Arrival date & time: 12/16/22 1539  Chief Complaint(s) Abdominal Pain  HPI Robin Guerrero is a 37 y.o. female with past medical history as below, significant for chronic pancreatitis, thoracic dissection, alcohol abuse, gastric varices, HTN who presents to the ED with complaint of abdominal pain. Pt reports she had stent placement at [facility] 12/26, next day she woke up with sig pain to epigastric area similar to prior episodes of pancreatitis. Having intermittent nausea and vomiting, taking apap at home w/o much relief. Called her GI doctor on call who requested she report to the ED for eval. Pt denies and fevers or chills, no change to bowel/bladder function. Pain is primarily epigastrium sharp stabbing burning w/ radiation to her back. Denies recent etoh use, denies any blood in her emesis.  Reports taking tylenol for pain at home w/o relief   Past Medical History Past Medical History:  Diagnosis Date   Descending thoracic dissection (HCC)    History of anemia    no current med.   Hypertension    states under control with med., has been on med. x 3 yr.   Pancreatitis    Patient Active Problem List   Diagnosis Date Noted   Pancreatitis, chronic ([place]) 12/16/2022   Sepsis due to pneumonia ([place]) 12/16/2022   Hypertensive urgency 11/01/2022   Aortic dissection, thoracoabdominal ([place]) 10/31/2022   Pain in the abdomen 08/01/2022   Acute upper GI bleed 07/31/2022   Alcohol-induced chronic pancreatitis ([place]) 07/07/2022   Alcohol use disorder, severe, in early remission ([place]) 07/07/2022   Gastric varices 07/07/2022   Hepatic steatosis 07/07/2022   Chronic generalized abdominal pain 07/07/2022   Abnormal endoscopy of upper gastrointestinal tract 07/07/2022   Portal hypertension ([place]) 07/07/2022   Portal vein thrombosis 07/07/2022   Iron deficiency anemia 06/11/2022   Acute blood loss anemia 06/04/2022   GERD  without esophagitis 06/04/2022   History of alcohol abuse 06/04/2022   Pseudocyst, pancreas 06/04/2022   Acute pancreatitis 04/22/2022   Aortic dissection (HCC) 02/20/2022   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   ARF (acute renal failure) ([place]) 06/04/2021   Acute on chronic pancreatitis ([place]) 06/04/2021   H/O aortic dissection 01/31/2021   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[place]) 11/09/2020   Malignant hypertension    Nausea and vomiting    Back pain    Hypocalcemia 34/19/6222   Acute alcoholic pancreatitis 97/98/9211   Thrombocytopenia ([place]) 08/19/2020   Normocytic anemia 94/17/4081   Alcoholic pancreatitis 44/81/8563   SIRS (systemic inflammatory response syndrome) ([place]) 08/18/2020   Hypokalemia 08/18/2020   Hypomagnesemia 08/18/2020   Essential hypertension 08/18/2020   Depression 08/18/2020   Tobacco use 08/18/2020   Home Medication(s) Prior to Admission medications   Medication Sig Start Date End Date Taking? Authorizing Provider  acetaminophen (TYLENOL) 500 MG tablet Take 1,000 mg by mouth every 6 (six) hours as needed for moderate pain.    [provider]  amLODipine (NORVASC) 10 MG tablet Take 1 tablet (10 mg total) by mouth daily. 03/08/22   Patwardhan, Reynold Bowen, MD  Bismuth/Metronidaz/Tetracyclin [phone] MG CAPS Take 3 capsules by mouth 4 (four) times daily. Patient not taking: Reported on 10/31/2022 09/26/22   [provider]  cloNIDine (CATAPRES) 0.2 MG tablet Take 1 tablet (0.2 mg total) by mouth every 8 (eight) hours. 03/08/22 06/12/23  Patwardhan, Reynold Bowen, MD  hydrALAZINE (APRESOLINE) 100 MG tablet TAKE 1 TABLET BY MOUTH 3 TIMES DAILY. 08/20/22   Patwardhan, Manish J,  MD  losartan (COZAAR) 100 MG tablet Take 1 tablet (100 mg total) by mouth daily. 06/08/22 10/31/22  Barb Merino, MD  metoCLOPramide (REGLAN) 10 MG tablet Take 1 tablet (10 mg total) by mouth every 6 (six) hours. 10/28/22   Fransico Meadow, MD  Multiple Vitamins-Minerals  (MULTIVITAMIN WITH MINERALS) tablet Take 1 tablet by mouth daily.    [provider]  ondansetron (ZOFRAN) 4 MG tablet Take 1 tablet (4 mg total) by mouth every 8 (eight) hours as needed for nausea or vomiting. 06/08/22   Barb Merino, MD  oxyCODONE (ROXICODONE) 5 MG immediate release tablet Take 1 tablet (5 mg total) by mouth every 8 (eight) hours as needed. Patient not taking: Reported on 10/30/2022 09/11/22 09/11/23  Mansouraty, Telford Nab., MD  oxyCODONE-acetaminophen (PERCOCET/ROXICET) 5-325 MG tablet Take 1 tablet by mouth every 6 (six) hours as needed for severe pain. 12/08/22   Sponseller, Rebekah R, PA-C  Pancrelipase, Lip-Prot-Amyl, (ZENPEP) [phone] units CPEP Take 1-2 capsules by mouth See admin instructions. 2 tablets with each meal and 1 tablet with snacks    [provider]  pantoprazole (PROTONIX) 40 MG tablet Take 40 mg by mouth 2 (two) times daily before a meal.    [provider]  promethazine (PHENERGAN) 12.5 MG tablet Take 1 tablet (12.5 mg total) by mouth every 8 (eight) hours as needed for nausea or vomiting. Patient not taking: Reported on 10/30/2022 07/05/22   Mansouraty, Telford Nab., MD  propranolol (INDERAL) 10 MG tablet Take 1 tablet (10 mg total) by mouth 2 (two) times daily. 06/08/22 10/31/22  Barb Merino, MD  vitamin B-12 1000 MCG tablet Take 1 tablet (1,000 mcg total) by mouth daily. 06/15/22   Elmarie Shiley, MD                                                                                                                                    Past Surgical History Past Surgical History:  Procedure Laterality Date   BIOPSY  09/11/2022   Procedure: BIOPSY;  Surgeon: Rush Landmark Telford Nab., MD;  Location: [facility];  Service: Gastroenterology;;   ESOPHAGOGASTRODUODENOSCOPY N/A 06/06/2022   Procedure: ESOPHAGOGASTRODUODENOSCOPY (EGD);  Surgeon: Arta Silence, MD;  Location: [facility];  Service: Gastroenterology;  Laterality: N/A;    ESOPHAGOGASTRODUODENOSCOPY N/A 06/14/2022   Procedure: ESOPHAGOGASTRODUODENOSCOPY (EGD);  Surgeon: [name], MD;  Location: [facility];  Service: Gastroenterology;  Laterality: N/A;   ESOPHAGOGASTRODUODENOSCOPY (EGD) WITH PROPOFOL N/A 08/01/2022   Procedure: ESOPHAGOGASTRODUODENOSCOPY (EGD) WITH PROPOFOL;  Surgeon: Clarene Essex, MD;  Location: [facility];  Service: Gastroenterology;  Laterality: N/A;   ESOPHAGOGASTRODUODENOSCOPY (EGD) WITH PROPOFOL N/A 09/11/2022   Procedure: ESOPHAGOGASTRODUODENOSCOPY (EGD) WITH PROPOFOL;  Surgeon: Rush Landmark Telford Nab., MD;  Location: [facility];  Service: Gastroenterology;  Laterality: N/A;   EUS N/A 09/11/2022   Procedure: UPPER ENDOSCOPIC ULTRASOUND (EUS) RADIAL;  Surgeon: Irving Copas., MD;  Location: [facility];  Service: Gastroenterology;  Laterality:  N/A;   FOREIGN BODY REMOVAL Left 02/10/2015   Procedure: ATTEMPTED EXPLANTATION OF BIRTH CONTROL DEVICE LEFT ARM;  Surgeon: Johnathan Hausen, MD;  Location: [facility];  Service: General;  Laterality: Left;   FOREIGN BODY REMOVAL Left 05/12/2015   Procedure: REMOVAL OF LEFT ARM BIRTH CONTROL DEVICE;  Surgeon: Johnathan Hausen, MD;  Location: [facility];  Service: General;  Laterality: Left;   NEUROLYTIC CELIAC PLEXUS N/A 09/11/2022   Procedure: NEUROLYTIC CELIAC PLEXUS;  Surgeon: Irving Copas., MD;  Location: [facility];  Service: Gastroenterology;  Laterality: N/A;   SUBMANDIBULAR GLAND EXCISION Right 05/01/2009   Family History Family History  Problem Relation Age of Onset   Breast cancer Mother 14   Heart disease Mother    Hypertension Mother    Colon cancer Neg Hx    Esophageal cancer Neg Hx    Inflammatory bowel disease Neg Hx    Liver disease Neg Hx    Pancreatic cancer Neg Hx    Rectal cancer Neg Hx    Stomach cancer Neg Hx     Social History Social History   Tobacco Use   Smoking status: Every Day    Packs/day: 0.12    Years:  5.00    Total pack years: 0.60    Types: Cigarettes   Smokeless tobacco: Never   Tobacco comments:    1 pack/week  Vaping Use   Vaping Use: Never used  Substance Use Topics   Alcohol use: Not Currently    Comment: occasionally   Drug use: No   Allergies Ativan [lorazepam] and Tape  Review of Systems Review of Systems  Constitutional:  Negative for activity change and fever.  HENT:  Negative for facial swelling and trouble swallowing.   Eyes:  Negative for discharge and redness.  Respiratory:  Negative for cough and shortness of breath.   Cardiovascular:  Negative for chest pain and palpitations.  Gastrointestinal:  Positive for abdominal pain, nausea and vomiting.  Genitourinary:  Negative for dysuria and flank pain.  Musculoskeletal:  Positive for back pain. Negative for gait problem.  Skin:  Negative for pallor and rash.  Neurological:  Negative for syncope and headaches.    Physical Exam Vital Signs  I have reviewed the triage vital signs BP (!) 153/117   Pulse (!) 145   Temp 99.5 F (37.5 C) (Oral)   Resp (!) 22   Ht '5\' 8"'$  (1.727 m)   Wt 75.8 kg   LMP 12/02/2022 (Within Weeks)   SpO2 100%   BMI 25.39 kg/m  Physical Exam Vitals and nursing note reviewed.  Constitutional:      General: She is not in acute distress.    Appearance: Normal appearance. She is well-developed. She is not ill-appearing.     Comments: tearful  HENT:     Head: Normocephalic and atraumatic.     Right Ear: External ear normal.     Left Ear: External ear normal.     Nose: Nose normal.     Mouth/Throat:     Mouth: Mucous membranes are moist.  Eyes:     General: No scleral icterus.       Right eye: No discharge.        Left eye: No discharge.  Cardiovascular:     Rate and Rhythm: Normal rate and regular rhythm.     Pulses: Normal pulses.     Heart sounds: Normal heart sounds.  Pulmonary:     Effort: Pulmonary effort is  normal. No respiratory distress.     Breath sounds: Normal  breath sounds.  Abdominal:     General: Abdomen is flat.     Tenderness: There is generalized abdominal tenderness and tenderness in the epigastric area. There is no guarding or rebound.    Musculoskeletal:        General: Normal range of motion.       Arms:     Cervical back: Normal range of motion.     Right lower leg: No edema.     Left lower leg: No edema.  Skin:    General: Skin is warm and dry.     Capillary Refill: Capillary refill takes less than 2 seconds.  Neurological:     Mental Status: She is alert.  Psychiatric:        Mood and Affect: Mood normal.        Behavior: Behavior normal.     ED Results and Treatments Labs (all labs ordered are listed, but only abnormal results are displayed) Labs Reviewed  CBC WITH DIFFERENTIAL/PLATELET - Abnormal; Notable for the following components:      Result Value   WBC 14.2 (*)    Neutro Abs 12.3 (*)    Abs Immature Granulocytes 0.22 (*)    All other components within normal limits  COMPREHENSIVE METABOLIC PANEL - Abnormal; Notable for the following components:   Sodium 134 (*)    CO2 16 (*)    Total Protein 8.2 (*)    Total Bilirubin 2.5 (*)    Anion gap 18 (*)    All other components within normal limits  T4, FREE - Abnormal; Notable for the following components:   Free T4 1.74 (*)    All other components within normal limits  RESP PANEL BY RT-PCR (RSV, FLU A&B, COVID)  RVPGX2  CULTURE, BLOOD (ROUTINE X 2)  CULTURE, BLOOD (ROUTINE X 2)  LIPASE, BLOOD  TSH  URINALYSIS, ROUTINE W REFLEX MICROSCOPIC  LACTIC ACID, PLASMA  LACTIC ACID, PLASMA  I-STAT BETA HCG BLOOD, ED ([facility], [facility], [facility] ONLY)                                                                                                                          Radiology DG Chest Portable 1 View  Result Date: 12/16/2022 CLINICAL DATA:  Pancreatic stent placed on 12/10/2022 due to chronic pancreatitis, nausea and vomiting, initial encounter EXAM: PORTABLE CHEST 1 VIEW  COMPARISON:  08/11/2022 FINDINGS: Cardiac shadow is within normal limits. Tortuous thoracic aorta is noted. Lungs are well aerated bilaterally. No bony abnormality is noted. IMPRESSION: No active disease. Electronically Signed   By: Inez Catalina M.D.   On: 12/16/2022 21:33   CT ABDOMEN PELVIS W CONTRAST  Result Date: 12/16/2022 CLINICAL DATA:  Epigastric pain post ERCP with stent for chronic pancreatitis December 16 EXAM: CT ABDOMEN AND PELVIS WITH CONTRAST TECHNIQUE: Multidetector CT imaging of the abdomen and pelvis was performed using the standard protocol following bolus  administration of intravenous contrast. RADIATION DOSE REDUCTION: This exam was performed according to the departmental dose-optimization program which includes automated exposure control, adjustment of the mA and/or kV according to patient size and/or use of iterative reconstruction technique. CONTRAST:  52m OMNIPAQUE IOHEXOL 350 MG/ML SOLN COMPARISON:  CT December 07, 2022. FINDINGS: Lower chest: Patchy ground-glass opacities in the bilateral lung bases favored infectious or inflammatory. Hepatobiliary: No suspicious hepatic lesion. Gallbladder is distended without focal wall thickening. No biliary ductal dilation. Pancreas: Stent in the main pancreatic duct without pancreatic ductal dilation. Pancreas appears more edematous with increased peripancreatic stranding. Foci of hypoenhancement in the pancreatic body/tail for instance on image 25/3. Small peripancreatic fluid collection measuring 2 cm between the tail of the pancreas and splenic hilum. Spleen: Normal size spleen. Adrenals/Urinary Tract: Bilateral adrenal glands appear normal. No hydronephrosis. Kidneys demonstrate symmetric enhancement. Urinary bladder is unremarkable for degree of distension. Stomach/Bowel: Marked wall edema of the gastric cardia and fundus. No pathologic dilation of small or large bowel. Moderate volume of formed stool in the colon. 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 Similar extrinsic narrowing of the portal vein and splenic vein near the confluence. SMV appears patent. Numerous compensatory venous collaterals similar prior. No pathologically enlarged abdominal or pelvic lymph nodes. Reproductive: Leiomyomatous uterus. Other: No pneumoperitoneum. Musculoskeletal: No acute osseous abnormality. IMPRESSION: 1. Acute on chronic pancreatitis with areas of hypoenhancement of the pancreatic body/tail suggestive of necrosis. Stent of the pancreatic duct without pancreatic ductal dilation. 2. New small peripancreatic fluid collection measuring 2 cm between the tail of the pancreas and splenic hilum. 3. Marked wall edema of the gastric cardia and fundus, likely reactive. 4. 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 5. Patchy ground-glass opacities in the bilateral lung bases favored infectious or inflammatory. Electronically Signed   By: JDahlia BailiffM.D.   On: 12/16/2022 20:38    Pertinent labs & imaging results that were available during my care of the patient were reviewed by me and considered in my medical decision making (see MDM for details).  Medications Ordered in ED Medications  lactated ringers infusion (has no administration in time range)  cefTRIAXone (ROCEPHIN) 2 g in sodium chloride 0.9 % 100 mL IVPB (2 g Intravenous New Bag/Given 12/16/22 2153)  metroNIDAZOLE (FLAGYL) IVPB 500 mg (has no administration in time range)  HYDROmorphone (DILAUDID) injection 1 mg (1 mg Intravenous Given 12/16/22 1642)  ondansetron (ZOFRAN) injection 4 mg (4 mg Intravenous Given 12/16/22 1642)  sodium chloride 0.9 % bolus 1,000 mL (0  mLs Intravenous Stopped 12/16/22 2003)  sodium chloride 0.9 % bolus 1,000 mL (0 mLs Intravenous Stopped 12/16/22 2014)  HYDROmorphone (DILAUDID) injection 0.5 mg (0.5 mg Intravenous Given 12/16/22 1806)  droperidol (INAPSINE) 2.5 MG/ML injection 1.25 mg (1.25 mg Intramuscular Given 12/16/22 1900)  iohexol (OMNIPAQUE) 350 MG/ML injection 75 mL (75 mLs Intravenous Contrast Given 12/16/22 2023)  HYDROmorphone (DILAUDID) injection 1 mg (1 mg Intravenous Given 12/16/22 2150)  Procedures .Critical Care  Performed by: Jeanell Sparrow, DO Authorized by: Jeanell Sparrow, DO   Critical care provider statement:    Critical care time (minutes):  44   Critical care time was exclusive of:  Separately billable procedures and treating other patients   Critical care was necessary to treat or prevent imminent or life-threatening deterioration of the following conditions:  Sepsis   Critical care was time spent personally by me on the following activities:  Development of treatment plan with patient or surrogate, discussions with consultants, evaluation of patient's response to treatment, examination of patient, ordering and review of laboratory studies, ordering and review of radiographic studies, ordering and performing treatments and interventions, pulse oximetry, re-evaluation of patient's condition, review of old charts and obtaining history from patient or surrogate   (including critical care time)  Medical Decision Making / ED Course   MDM:  Tsion Inghram is a 37 y.o. female with past medical history as above, significant for chronic pancreatitis, etoh abuse, esophag varices, thoracic dissection which further complicates the presenting complaint. Pt presents to the ED with complaint of abd/epig/back pain. The complaint involves an extensive differential diagnosis and also carries  with it a high risk of complications and morbidity.  Serious etiology was considered. Ddx includes but is not limited to: Differential diagnosis includes but is not exclusive to acute cholecystitis, intrathoracic causes for epigastric abdominal pain, gastritis, duodenitis, pancreatitis, small bowel or large bowel obstruction, abdominal aortic aneurysm, hernia, gastritis, etc.  On initial assessment the patient is: tachycardic, tearful, in significant amount of pain to epigastrium Vital signs and nursing notes were reviewed  Labs reviewed,  WBC elev at 14.2, she is afebrile, having nausea/vomiting, pancreatic necrosis? On imaging, will give further ivf, send cultures, code sepsis paged  Cr stable Her t-bili is 2.5, a week ago was 1.6  Clinical Course as of 12/16/22 2209  Mon Dec 16, 2022  1627 Delay in labs, pt with poor IV access, IV team to bedside for assistance  [SG]  2114 CTAP with concern for acute on chronic pancreatitis, pancreatic duct appears to be functioning, pancreatic necrosis noted. Patchy opacities to lungs, will get CXR. She denies respiratory complaints today. RVP was neg  Pt requiring multiple doses iv parenteral analgesia, also received droperidol. She has acute on chronic pancreatitis. Send blood cultures and started rocephin/flagyl given resumed pancreatic necrosis on imaging. Will admit to hospitalist service.  [SG]  2205 CXR w/o overt pna on wet read, agree w/ radiologist on formal  [SG]    Clinical Course User Index [SG] Jeanell Sparrow, DO    Spoke w/ Dr Jonelle Sidle who accepts pt for admission  Additional history obtained: -Additional history obtained from na -External records from outside source obtained and reviewed including: Chart review including previous notes, labs, imaging, consultation notes including prior ed visits, prior labs/imaging/medications   >>ERCP with endoscopic stent placement [facility] 12/26 Dr Ella Jubilee, K  Lab Tests: -I ordered, reviewed, and  interpreted labs.   The pertinent results include:   Labs Reviewed  CBC WITH DIFFERENTIAL/PLATELET - Abnormal; Notable for the following components:      Result Value   WBC 14.2 (*)    Neutro Abs 12.3 (*)    Abs Immature Granulocytes 0.22 (*)    All other components within normal limits  COMPREHENSIVE METABOLIC PANEL - Abnormal; Notable for the following components:   Sodium 134 (*)    CO2 16 (*)    Total Protein 8.2 (*)  Total Bilirubin 2.5 (*)    Anion gap 18 (*)    All other components within normal limits  T4, FREE - Abnormal; Notable for the following components:   Free T4 1.74 (*)    All other components within normal limits  RESP PANEL BY RT-PCR (RSV, FLU A&B, COVID)  RVPGX2  CULTURE, BLOOD (ROUTINE X 2)  CULTURE, BLOOD (ROUTINE X 2)  LIPASE, BLOOD  TSH  URINALYSIS, ROUTINE W REFLEX MICROSCOPIC  LACTIC ACID, PLASMA  LACTIC ACID, PLASMA  I-STAT BETA HCG BLOOD, ED ([facility], [facility], [facility] ONLY)    Notable for as above, elev wbc, t bili elev  EKG   EKG Interpretation  Date/Time:  Monday December 16 2022 17:56:36 EST Ventricular Rate:  118 PR Interval:  149 QRS Duration: 88 QT Interval:  346 QTC Calculation: 485 R Axis:   40 Text Interpretation: Sinus tachycardia Atrial premature complex Low voltage, precordial leads no stemi Confirmed by Wynona Dove ([id number]) on 12/16/2022 6:15:23 PM         Imaging Studies ordered: I ordered imaging studies including ctap On my interpretation imaging demonstrates acute on chronic pancreatitis, ?necrosis to pancreas, ?pna vs inflammatory changes, pancreatic duct in place - does not appear to be pancreatic ductal dilation. Type b dissection unchanged  I independently visualized and interpreted imaging. I agree with the radiologist interpretation   Medicines ordered and prescription drug management: Meds ordered this encounter  Medications   HYDROmorphone (DILAUDID) injection 1 mg   ondansetron (ZOFRAN) injection 4 mg   sodium chloride  0.9 % bolus 1,000 mL   sodium chloride 0.9 % bolus 1,000 mL   HYDROmorphone (DILAUDID) injection 0.5 mg   droperidol (INAPSINE) 2.5 MG/ML injection 1.25 mg   iohexol (OMNIPAQUE) 350 MG/ML injection 75 mL   lactated ringers infusion   cefTRIAXone (ROCEPHIN) 2 g in sodium chloride 0.9 % 100 mL IVPB    Order Specific Question:   Antibiotic Indication:    Answer:   Intra-abdominal   metroNIDAZOLE (FLAGYL) IVPB 500 mg    Order Specific Question:   Antibiotic Indication:    Answer:   Intra-abdominal Infection   HYDROmorphone (DILAUDID) injection 1 mg    -I have reviewed the patients home medicines and have made adjustments as needed   Consultations Obtained: I requested consultation with the na,  and discussed lab and imaging findings as well as pertinent plan - they recommend: na   Cardiac Monitoring: The patient was maintained on a cardiac monitor.  I personally viewed and interpreted the cardiac monitored which showed an underlying rhythm of: sinus tachy  Social Determinants of Health:  Diagnosis or treatment significantly limited by social determinants of health: current smoker Counseled patient for approximately 3 minutes regarding smoking cessation. Discussed risks of smoking and how they applied and affected their visit here today. Patient not ready to quit at this time, however will follow up with their primary doctor when they are.   CPT code: [id number]: intermediate counseling for smoking cessation     Reevaluation: After the interventions noted above, I reevaluated the patient and found that they have improved  Co morbidities that complicate the patient evaluation  Past Medical History:  Diagnosis Date   Descending thoracic dissection ([employer])    History of anemia    no current med.   Hypertension    states under control with med., has been on med. x 3 yr.   Pancreatitis       Dispostion: Disposition decision including need for hospitalization  was considered, and  patient admitted to the hospital.    Final Clinical Impression(s) / ED Diagnoses Final diagnoses:  Acute on chronic pancreatitis [facility])  Other chronic pain  Chronic thoracic aortic dissection (HCC)  Intractable abdominal pain     This chart was dictated using voice recognition software.  Despite best efforts to proofread,  errors can occur which can change the documentation meaning.    Jeanell Sparrow, DO 12/16/22 2209

## 2022-12-16 NOTE — Sepsis Progress Note (Signed)
Elink monitoring for the code sepsis protocol.   Notified provider of need to order lactic acid & blood cultures.

## 2022-12-17 ENCOUNTER — Other Ambulatory Visit: Payer: Self-pay

## 2022-12-17 DIAGNOSIS — K861 Other chronic pancreatitis: Secondary | ICD-10-CM | POA: Diagnosis not present

## 2022-12-17 DIAGNOSIS — K859 Acute pancreatitis without necrosis or infection, unspecified: Secondary | ICD-10-CM | POA: Diagnosis not present

## 2022-12-17 LAB — LACTIC ACID, PLASMA: Lactic Acid, Venous: 0.8 mmol/L (ref 0.5–1.9)

## 2022-12-17 LAB — CREATININE, SERUM
Creatinine, Ser: 0.77 mg/dL (ref 0.44–1.00)
GFR, Estimated: >60 mL/min (ref >60)

## 2022-12-17 MED ORDER — AMLODIPINE BESYLATE 10 MG PO TABS
10.0000 mg | ORAL_TABLET | Freq: Every day | ORAL | Status: DC
  Administered 2022-12-17 – 2022-12-21 (×5): 10 mg via ORAL
  Filled 2022-12-17 (×3): qty 1
  Filled 2022-12-17: qty 2
  Filled 2022-12-17: qty 1

## 2022-12-17 MED ORDER — VITAMIN B-12 1000 MCG PO TABS
1000.0000 ug | ORAL_TABLET | Freq: Every day | ORAL | Status: DC
  Administered 2022-12-17 – 2022-12-21 (×5): 1000 ug via ORAL
  Filled 2022-12-17 (×5): qty 1

## 2022-12-17 MED ORDER — CLONIDINE HCL 0.2 MG PO TABS: 0.2000 mg | ORAL_TABLET | Freq: Three times a day (TID) | ORAL | Status: DC

## 2022-12-17 MED ORDER — LOSARTAN POTASSIUM 50 MG PO TABS: 100.0000 mg | ORAL_TABLET | Freq: Every day | ORAL | Status: DC

## 2022-12-17 MED ORDER — PANTOPRAZOLE SODIUM 40 MG PO TBEC
40.0000 mg | DELAYED_RELEASE_TABLET | Freq: Two times a day (BID) | ORAL | Status: DC
  Administered 2022-12-17 – 2022-12-21 (×9): 40 mg via ORAL
  Filled 2022-12-17 (×9): qty 1

## 2022-12-17 MED ORDER — PANCRELIPASE (LIP-PROT-AMYL) 40000-126000 UNITS PO CPEP: 1.0000 | ORAL_CAPSULE | ORAL | Status: DC

## 2022-12-17 MED ORDER — PANCRELIPASE (LIP-PROT-AMYL) 36000-114000 UNITS PO CPEP
36000.0000 [IU] | ORAL_CAPSULE | Freq: Three times a day (TID) | ORAL | Status: DC
  Administered 2022-12-17 – 2022-12-21 (×12): 36000 [IU] via ORAL
  Filled 2022-12-17 (×13): qty 1

## 2022-12-17 MED ORDER — PROPRANOLOL HCL 10 MG PO TABS: 10.0000 mg | ORAL_TABLET | Freq: Two times a day (BID) | ORAL | Status: DC

## 2022-12-17 MED ORDER — HYDRALAZINE HCL 50 MG PO TABS
100.0000 mg | ORAL_TABLET | Freq: Three times a day (TID) | ORAL | Status: DC
  Administered 2022-12-17 – 2022-12-21 (×13): 100 mg via ORAL
  Filled 2022-12-17 (×13): qty 2

## 2022-12-17 NOTE — Progress Notes (Signed)
Pt admitted for pancreatitis  A&OX4, IV pain meds given, tele verified pt sinus tach. No skin issues. BP (!) 157/105 (BP Location: Right Arm)   Pulse (!) 118   Temp 98.1 F (36.7 C) (Oral)   Resp 17   Ht '5\' 8"'$  (1.727 m)   Wt 75.8 kg   LMP 12/02/2022 (Within Weeks)   SpO2 100%   BMI 25.39 kg/m  Pt tachy and hypertensive due to pain. Bed in lowest position, call bell nearby and bed alarm on. Louanne Skye 12/17/22 7:39 PM

## 2022-12-17 NOTE — Progress Notes (Signed)
PROGRESS NOTE    Robin Guerrero  PXT:[id number] DOB: Aug 11, 1986 DOA: 12/16/2022 PCP: Patient, No Pcp Per   Brief Narrative:  HPI: Robin Guerrero is a 37 y.o. female with medical history significant of alcoholic pancreatitis, chronic recurrent pancreatitis with pseudocyst status post stent placement last week at [facility], stable descending thoracic dissection, essential hypertension who presents to the ER with nausea and abdominal pain rated as 10 out of 10.  Pain is centrally located radiating to the epigastric region.  Associated with nausea and not relieved by anything.  Pain is worse with food.  Patient was seen and evaluated in the ER.  She is seen bending over and apparent severe pain.  Her workup shows normal lipase but CT abdomen pelvis showed acute on chronic pancreatitis with possible necrosis involving the body and tail of the pancreas.  Also questionable bilateral lower lobe infiltrates.  Patient is being admitted mainly for pain control.  No fever or chills.  She has significant leukocytosis.  Otherwise no other significant findings.  Patient has met SIRS criteria with tachycardia heart rate up to 140s leukocytosis as well as possible pneumonia indicating sepsis.   Assessment & Plan:   Principal Problem:   Acute on chronic pancreatitis (HCC) Active Problems:   Essential hypertension   GERD without esophagitis   Alcohol-induced chronic pancreatitis (HCC)   Alcohol use disorder, severe, in early remission ([place])   Sepsis due to pneumonia ([place])  Acute on chronic pancreatitis: Has a history of chronic alcoholic pancreatitis.  She denies drinking alcohol for the very long time.  CT abdomen shows acute pancreatitis.  Lipase normal.  She feels better.  She wants to take some liquids.  Will start on clear liquid diet.  Continue pain management.  Resume pancrelipase.  Sepsis secondary to healthcare associated pneumonia: CT abdomen shows bibasilar infiltrates, could be just  atelectasis however she has tachycardia and leukocytosis so she was started on cefepime which I will continue.  Will check procalcitonin.  History of aortic dissection: Appears to be stable.  Tobacco abuse: Nicotine patch.  Hypertensive urgency: Blood pressure significantly elevated.  Will resume home amlodipine, clonidine, hydralazine, losartan and atenolol.  Continue as needed hydralazine.  DVT prophylaxis: enoxaparin (LOVENOX) injection 40 mg Start: 12/16/22 2245   Code Status: Full Code  Family Communication:  None present at bedside.  Plan of care discussed with patient in length and he/she verbalized understanding and agreed with it.  Status is: Inpatient Remains inpatient appropriate because: Still with pain.   Estimated body mass index is 25.39 kg/m as calculated from the following:   Height as of this encounter: _0  (1.727 m).   Weight as of this encounter: 75.8 kg.    Nutritional Assessment: Body mass index is 25.39 kg/m.Marland Kitchen Seen by dietician.  I agree with the assessment and plan as outlined below: Nutrition Status:        . Skin Assessment: I have examined the patient's skin and I agree with the wound assessment as performed by the wound care RN as outlined below:    Consultants:  None  Procedures:  None  Antimicrobials:  Anti-infectives (From admission, onward)    Start     Dose/Rate Route Frequency Ordered Stop   12/17/22 0000  ceFEPIme (MAXIPIME) 2 g in sodium chloride 0.9 % 100 mL IVPB        2 g 200 mL/hr over 30 Minutes Intravenous Every 8 hours 12/16/22 2226     12/16/22 2100  cefTRIAXone (ROCEPHIN)  2 g in sodium chloride 0.9 % 100 mL IVPB        2 g 200 mL/hr over 30 Minutes Intravenous  Once 12/16/22 2058 12/16/22 2240   12/16/22 2100  metroNIDAZOLE (FLAGYL) IVPB 500 mg        500 mg 100 mL/hr over 60 Minutes Intravenous  Once 12/16/22 2058 12/16/22 2352         Subjective: Patient seen and examined.  She states that she feels better  than yesterday.  Abdominal pain is improving with the pain medications.  No nausea.  She wants to try clears.  She was annoyed and felt offended when I asked her about her alcohol intake and when she actually quit it.  Objective: Vitals:   12/17/22 0600 12/17/22 0630 12/17/22 0645 12/17/22 0700  BP: (!) 149/117   (!) 163/120  Pulse: (!) 127 (!) 117 (!) 114 (!) 116  Resp: _0 Temp:      TempSrc:      SpO2: 97% 97% 96% 96%  Weight:      Height:       No intake or output data in the 24 hours ending 12/17/22 0840 Filed Weights   12/16/22 1546  Weight: 75.8 kg    Examination:  General exam: Appears calm and comfortable  Respiratory system: Clear to auscultation. Respiratory effort normal. Cardiovascular system: S1 & S2 heard, RRR. No JVD, murmurs, rubs, gallops or clicks. No pedal edema. Gastrointestinal system: Abdomen is soft but slightly distended and tender to palpation in the epigastric, right upper quadrant and left upper quadrant area, no organomegaly or masses felt. Normal bowel sounds heard. Central nervous system: Alert and oriented. No focal neurological deficits. Extremities: Symmetric 5 x 5 power. Skin: No rashes, lesions or ulcers Psychiatry: Judgement and insight appear normal. Mood & affect appropriate.    Data Reviewed: I have personally reviewed following labs and imaging studies  CBC: Recent Labs  Lab 12/16/22 1623 12/16/22 2346  WBC 14.2* 12.1*  NEUTROABS 12.3*  --   HGB 12.8 11.3*  HCT 38.9 35.2*  MCV 93.5 94.9  PLT 376 030   Basic Metabolic Panel: Recent Labs  Lab 12/16/22 1623 12/16/22 2346  NA 134*  --   K 4.5  --   CL 100  --   CO2 16*  --   GLUCOSE 84  --   BUN 9  --   CREATININE 0.72 0.77  CALCIUM 9.3  --    GFR: Estimated Creatinine Clearance: 98.1 mL/min (by C-G formula based on SCr of 0.77 mg/dL). Liver Function Tests: Recent Labs  Lab 12/16/22 1623  AST 33  ALT 17  ALKPHOS 77  BILITOT 2.5*  PROT 8.2*  ALBUMIN  3.6   Recent Labs  Lab 12/16/22 1623  LIPASE 43   No results for input(s): "AMMONIA" in the last 168 hours. Coagulation Profile: No results for input(s): "INR", "PROTIME" in the last 168 hours. Cardiac Enzymes: No results for input(s): "CKTOTAL", "CKMB", "CKMBINDEX", "TROPONINI" in the last 168 hours. BNP (last 3 results) No results for input(s): "PROBNP" in the last 8760 hours. HbA1C: No results for input(s): "HGBA1C" in the last 72 hours. CBG: No results for input(s): "GLUCAP" in the last 168 hours. Lipid Profile: No results for input(s): "CHOL", "HDL", "LDLCALC", "TRIG", "CHOLHDL", "LDLDIRECT" in the last 72 hours. Thyroid Function Tests: Recent Labs    12/16/22 1623  TSH 0.495  FREET4 1.74*   Anemia Panel: No results for input(s): "VITAMINB12", "FOLATE", "  FERRITIN", "TIBC", "IRON", "RETICCTPCT" in the last 72 hours. Sepsis Labs: Recent Labs  Lab 12/16/22 2346  LATICACIDVEN 0.8    Recent Results (from the past 240 hour(s))  Resp panel by RT-PCR (RSV, Flu A&B, Covid) Anterior Nasal Swab     Status: None   Collection Time: 12/16/22  4:04 PM   Specimen: Anterior Nasal Swab  Result Value Ref Range Status   SARS Coronavirus 2 by RT PCR NEGATIVE NEGATIVE Final    Comment: (NOTE) SARS-CoV-2 target nucleic acids are NOT DETECTED.  The SARS-CoV-2 RNA is generally detectable in upper respiratory specimens during the acute phase of infection. The lowest concentration of SARS-CoV-2 viral copies this assay can detect is 138 copies/mL. A negative result does not preclude SARS-Cov-2 infection and should not be used as the sole basis for treatment or other patient management decisions. A negative result may occur with  improper specimen collection/handling, submission of specimen other than nasopharyngeal swab, presence of viral mutation(s) within the areas targeted by this assay, and inadequate number of viral copies(<138 copies/mL). A negative result must be combined  with clinical observations, patient history, and epidemiological information. The expected result is Negative.  Fact Sheet for Patients:  EntrepreneurPulse.com.au  Fact Sheet for Healthcare Providers:  IncredibleEmployment.be  This test is no t yet approved or cleared by the [place] FDA and  has been authorized for detection and/or diagnosis of SARS-CoV-2 by FDA under an Emergency Use Authorization (EUA). This EUA will remain  in effect (meaning this test can be used) for the duration of the COVID-19 declaration under Section 564(b)(1) of the Act, 21 U.S.C.section 360bbb-3(b)(1), unless the authorization is terminated  or revoked sooner.       Influenza A by PCR NEGATIVE NEGATIVE Final   Influenza B by PCR NEGATIVE NEGATIVE Final    Comment: (NOTE) The Xpert Xpress SARS-CoV-2/FLU/RSV plus assay is intended as an aid in the diagnosis of influenza from Nasopharyngeal swab specimens and should not be used as a sole basis for treatment. Nasal washings and aspirates are unacceptable for Xpert Xpress SARS-CoV-2/FLU/RSV testing.  Fact Sheet for Patients: EntrepreneurPulse.com.au  Fact Sheet for Healthcare Providers: IncredibleEmployment.be  This test is not yet approved or cleared by the [place] FDA and has been authorized for detection and/or diagnosis of SARS-CoV-2 by FDA under an Emergency Use Authorization (EUA). This EUA will remain in effect (meaning this test can be used) for the duration of the COVID-19 declaration under Section 564(b)(1) of the Act, 21 U.S.C. section 360bbb-3(b)(1), unless the authorization is terminated or revoked.     Resp Syncytial Virus by PCR NEGATIVE NEGATIVE Final    Comment: (NOTE) Fact Sheet for Patients: EntrepreneurPulse.com.au  Fact Sheet for Healthcare Providers: IncredibleEmployment.be  This test is not yet approved  or cleared by the [place] FDA and has been authorized for detection and/or diagnosis of SARS-CoV-2 by FDA under an Emergency Use Authorization (EUA). This EUA will remain in effect (meaning this test can be used) for the duration of the COVID-19 declaration under Section 564(b)(1) of the Act, 21 U.S.C. section 360bbb-3(b)(1), unless the authorization is terminated or revoked.  Performed at [facility], [place] [address] [place], [place] [address]      Radiology Studies: DG Chest Portable 1 View  Result Date: 12/16/2022 CLINICAL DATA:  Pancreatic stent placed on 12/10/2022 due to chronic pancreatitis, nausea and vomiting, initial encounter EXAM: PORTABLE CHEST 1 VIEW COMPARISON:  08/11/2022 FINDINGS: Cardiac shadow is within normal limits. Tortuous thoracic  aorta is noted. Lungs are well aerated bilaterally. No bony abnormality is noted. IMPRESSION: No active disease. Electronically Signed   By: Inez Catalina M.D.   On: 12/16/2022 21:33   CT ABDOMEN PELVIS W CONTRAST  Result Date: 12/16/2022 CLINICAL DATA:  Epigastric pain post ERCP with stent for chronic pancreatitis December 16 EXAM: CT ABDOMEN AND PELVIS WITH CONTRAST TECHNIQUE: Multidetector CT imaging of the abdomen and pelvis was performed using the standard protocol following bolus administration of intravenous contrast. RADIATION DOSE REDUCTION: This exam was performed according to the departmental dose-optimization program which includes automated exposure control, adjustment of the mA and/or kV according to patient size and/or use of iterative reconstruction technique. CONTRAST:  59m OMNIPAQUE IOHEXOL 350 MG/ML SOLN COMPARISON:  CT December 07, 2022. FINDINGS: Lower chest: Patchy ground-glass opacities in the bilateral lung bases favored infectious or inflammatory. Hepatobiliary: No suspicious hepatic lesion. Gallbladder is distended without focal wall thickening. No biliary ductal dilation. Pancreas: Stent in the main  pancreatic duct without pancreatic ductal dilation. Pancreas appears more edematous with increased peripancreatic stranding. Foci of hypoenhancement in the pancreatic body/tail for instance on image 25/3. Small peripancreatic fluid collection measuring 2 cm between the tail of the pancreas and splenic hilum. Spleen: Normal size spleen. Adrenals/Urinary Tract: Bilateral adrenal glands appear normal. No hydronephrosis. Kidneys demonstrate symmetric enhancement. Urinary bladder is unremarkable for degree of distension. Stomach/Bowel: Marked wall edema of the gastric cardia and fundus. No pathologic dilation of small or large bowel. Moderate volume of formed stool in the colon. 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 Similar extrinsic narrowing of the portal vein and splenic vein near the confluence. SMV appears patent. Numerous compensatory venous collaterals similar prior. No pathologically enlarged abdominal or pelvic lymph nodes. Reproductive: Leiomyomatous uterus. Other: No pneumoperitoneum. Musculoskeletal: No acute osseous abnormality. IMPRESSION: 1. Acute on chronic pancreatitis with areas of hypoenhancement of the pancreatic body/tail suggestive of necrosis. Stent of the pancreatic duct without pancreatic ductal dilation. 2. New small peripancreatic fluid collection measuring 2 cm between the tail of the pancreas and splenic hilum. 3. Marked wall edema of the gastric cardia and fundus, likely reactive. 4. 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 5. Patchy ground-glass opacities in the bilateral lung bases favored infectious or inflammatory. Electronically Signed    By: JDahlia BailiffM.D.   On: 12/16/2022 20:38    Scheduled Meds:  amLODipine  10 mg Oral Daily   cloNIDine  0.2 mg Oral Q8H   cyanocobalamin  1,000 mcg Oral Daily   enoxaparin (LOVENOX) injection  40 mg Subcutaneous QHS   hydrALAZINE  100 mg Oral TID   losartan  100 mg Oral Daily   nicotine  21 mg Transdermal Daily   Pancrelipase (Lip-Prot-Amyl)  1-2 capsule Oral See admin instructions   pantoprazole  40 mg Oral BID AC   propranolol  10 mg Oral BID   Continuous Infusions:  ceFEPime (MAXIPIME) IV Stopped (12/17/22 0835)   dextrose 5% lactated ringers 125 mL/hr at 12/16/22 2255     LOS: 1 day   RDarliss Cheney MD [facility]  12/17/2022, 8:40 AM   *Please note that this is a verbal dictation therefore any spelling or grammatical errors are due to  the "[place] [employer]" system interpretation.  Please page via [place] and do not message via secure chat for urgent patient care matters. Secure chat can be used for non urgent patient care matters.  How to contact the [facility] Attending or Consulting provider [place] or covering provider during after hours [place], for this patient?  Check the care team in [facility] and look for a) attending/consulting TR[facility] provider listed and b) the [facility] team listed. Page or secure chat [phone]. Log into [email or web address] and use [place]'s universal password to access. If you do not have the password, please contact the hospital operator. Locate the [facility] provider you are looking for under [facility] and page to a number that you can be directly reached. If you still have difficulty reaching the provider, please page the [facility] (Director on Call) for the Hospitalists listed on amion for assistance.

## 2022-12-18 DIAGNOSIS — K859 Acute pancreatitis without necrosis or infection, unspecified: Secondary | ICD-10-CM | POA: Diagnosis not present

## 2022-12-18 DIAGNOSIS — K861 Other chronic pancreatitis: Secondary | ICD-10-CM | POA: Diagnosis not present

## 2022-12-18 LAB — COMPREHENSIVE METABOLIC PANEL
ALT: 8 U/L (ref 0–44)
AST: 9 U/L — ABNORMAL LOW (ref 15–41)
Albumin: 2.6 g/dL — ABNORMAL LOW (ref 3.5–5.0)
Alkaline Phosphatase: 45 U/L (ref 38–126)
Anion gap: 12 (ref 5–15)
BUN: <5 mg/dL — ABNORMAL LOW (ref 6–20)
CO2: 21 mmol/L — ABNORMAL LOW (ref 22–32)
Calcium: 8.8 mg/dL — ABNORMAL LOW (ref 8.9–10.3)
Chloride: 103 mmol/L (ref 98–111)
Creatinine, Ser: 0.57 mg/dL (ref 0.44–1.00)
GFR, Estimated: >60 mL/min (ref >60)
Glucose, Bld: 97 mg/dL (ref 70–99)
Potassium: 3.1 mmol/L — ABNORMAL LOW (ref 3.5–5.1)
Sodium: 136 mmol/L (ref 135–145)
Total Bilirubin: 1 mg/dL (ref 0.3–1.2)
Total Protein: 6.4 g/dL — ABNORMAL LOW (ref 6.5–8.1)

## 2022-12-18 LAB — CBC WITH DIFFERENTIAL/PLATELET
Abs Immature Granulocytes: 0.08 10*3/uL — ABNORMAL HIGH (ref 0.00–0.07)
Basophils Absolute: 0 10*3/uL (ref 0.0–0.1)
Basophils Relative: 0 %
Eosinophils Absolute: 0 10*3/uL (ref 0.0–0.5)
Eosinophils Relative: 0 %
HCT: 28.3 % — ABNORMAL LOW (ref 36.0–46.0)
Hemoglobin: 9.6 g/dL — ABNORMAL LOW (ref 12.0–15.0)
Immature Granulocytes: 1 %
Lymphocytes Relative: 7 %
Lymphs Abs: 0.8 10*3/uL (ref 0.7–4.0)
MCH: 31.4 pg (ref 26.0–34.0)
MCHC: 33.9 g/dL (ref 30.0–36.0)
MCV: 92.5 fL (ref 80.0–100.0)
Monocytes Absolute: 0.7 10*3/uL (ref 0.1–1.0)
Monocytes Relative: 6 %
Neutro Abs: 10.1 10*3/uL — ABNORMAL HIGH (ref 1.7–7.7)
Neutrophils Relative %: 86 %
Platelets: 192 10*3/uL (ref 150–400)
RBC: 3.06 MIL/uL — ABNORMAL LOW (ref 3.87–5.11)
RDW: 13.4 % (ref 11.5–15.5)
WBC: 11.8 10*3/uL — ABNORMAL HIGH (ref 4.0–10.5)
nRBC: 0 % (ref 0.0–0.2)

## 2022-12-18 LAB — PROCALCITONIN: Procalcitonin: 2.77 ng/mL

## 2022-12-18 LAB — MAGNESIUM: Magnesium: 1.4 mg/dL — ABNORMAL LOW (ref 1.7–2.4)

## 2022-12-18 MED ORDER — POTASSIUM CHLORIDE CRYS ER 20 MEQ PO TBCR
40.0000 meq | EXTENDED_RELEASE_TABLET | ORAL | Status: AC
  Administered 2022-12-18 (×2): 40 meq via ORAL
  Filled 2022-12-18 (×2): qty 2

## 2022-12-18 MED ORDER — BOOST / RESOURCE BREEZE PO LIQD CUSTOM
1.0000 | Freq: Three times a day (TID) | ORAL | Status: DC
  Administered 2022-12-18 – 2022-12-21 (×6): 1 via ORAL

## 2022-12-18 MED ORDER — LABETALOL HCL 5 MG/ML IV SOLN: 10.0000 mg | INTRAVENOUS | Status: DC | PRN

## 2022-12-18 MED ORDER — THIAMINE MONONITRATE 100 MG PO TABS
100.0000 mg | ORAL_TABLET | Freq: Every day | ORAL | Status: DC
  Administered 2022-12-18 – 2022-12-21 (×4): 100 mg via ORAL
  Filled 2022-12-18 (×4): qty 1

## 2022-12-18 MED ORDER — THIAMINE HCL 100 MG/ML IJ SOLN: 100.0000 mg | Freq: Every day | INTRAMUSCULAR | Status: DC

## 2022-12-18 MED ORDER — ADULT MULTIVITAMIN W/MINERALS CH
1.0000 | ORAL_TABLET | Freq: Every day | ORAL | Status: DC
  Administered 2022-12-18 – 2022-12-21 (×4): 1 via ORAL
  Filled 2022-12-18 (×4): qty 1

## 2022-12-18 MED ORDER — LOSARTAN POTASSIUM 50 MG PO TABS
100.0000 mg | ORAL_TABLET | Freq: Every day | ORAL | Status: DC
  Administered 2022-12-18 – 2022-12-21 (×4): 100 mg via ORAL
  Filled 2022-12-18 (×4): qty 2

## 2022-12-18 MED ORDER — FOLIC ACID 1 MG PO TABS
1.0000 mg | ORAL_TABLET | Freq: Every day | ORAL | Status: DC
  Administered 2022-12-18 – 2022-12-21 (×4): 1 mg via ORAL
  Filled 2022-12-18 (×4): qty 1

## 2022-12-18 MED ORDER — MAGNESIUM SULFATE 4 GM/100ML IV SOLN
4.0000 g | Freq: Once | INTRAVENOUS | Status: AC
  Administered 2022-12-18: 4 g via INTRAVENOUS
  Filled 2022-12-18: qty 100

## 2022-12-18 MED ORDER — CLONIDINE HCL 0.1 MG PO TABS
0.1000 mg | ORAL_TABLET | Freq: Three times a day (TID) | ORAL | Status: DC
  Administered 2022-12-18 – 2022-12-20 (×9): 0.1 mg via ORAL
  Filled 2022-12-18 (×9): qty 1

## 2022-12-18 NOTE — Progress Notes (Signed)
PROGRESS NOTE    Robin Guerrero  HUT:[id number] DOB: 1986-07-28 DOA: 12/16/2022 PCP: Patient, No Pcp Per   Brief Narrative:  HPI: Robin Guerrero is a 37 y.o. female with medical history significant of alcoholic pancreatitis, chronic recurrent pancreatitis with pseudocyst status post stent placement last week at [facility], stable descending thoracic dissection, essential hypertension who presents to the ER with nausea and abdominal pain rated as 10 out of 10.  Pain is centrally located radiating to the epigastric region.  Associated with nausea and not relieved by anything.  Pain is worse with food.  Patient was seen and evaluated in the ER.  She is seen bending over and apparent severe pain.  Her workup shows normal lipase but CT abdomen pelvis showed acute on chronic pancreatitis with possible necrosis involving the body and tail of the pancreas.  Also questionable bilateral lower lobe infiltrates.  Patient is being admitted mainly for pain control.  No fever or chills.  She has significant leukocytosis.  Otherwise no other significant findings.  Patient has met SIRS criteria with tachycardia heart rate up to 140s leukocytosis as well as possible pneumonia indicating sepsis.   Assessment & Plan:   Principal Problem:   Acute on chronic pancreatitis (HCC) Active Problems:   Essential hypertension   GERD without esophagitis   Hypokalemia   Hypomagnesemia   Alcohol-induced chronic pancreatitis (HCC)   Alcohol use disorder, severe, in early remission ([place])   Sepsis due to pneumonia ([place])  Acute on chronic pancreatitis: Has a history of chronic alcoholic pancreatitis.  She denies drinking alcohol for the very long time.  CT abdomen shows acute pancreatitis.  Lipase normal.  She is improving, abdominal pain and tenderness is improving, no nausea.  I have advised her to full liquid diet.  I will advance her to soft diet for the dinner today.  Sepsis secondary to healthcare associated  pneumonia: CT abdomen shows bibasilar infiltrates, could be just atelectasis however she has tachycardia and leukocytosis so she was started on cefepime which I will continue.  Will check procalcitonin.  History of aortic dissection: Appears to be stable.  Tobacco abuse: Nicotine patch.  Hypomagnesemia: Will replace.  Hypokalemia: Will replace.  Hypertensive urgency: Blood pressure significantly elevated upon arrival, resumed.  home amlodipine, clonidine, hydralazine, losartan and atenolol but then I was told by pharmacy that patient could not verify if she was taking clonidine, losartan or atenolol so we discontinued those.  I personally spoke to the patient today, she did verify that she has been taking all the medications and she still has medications at home and her blood pressure still is elevated so I have resumed her clonidine at lower dosage of 0.1 mg 3 times daily and losartan 100 mg p.o. daily.  DVT prophylaxis: enoxaparin (LOVENOX) injection 40 mg Start: 12/16/22 2245   Code Status: Full Code  Family Communication:  None present at bedside.  Plan of care discussed with patient in length and he/she verbalized understanding and agreed with it.  Status is: Inpatient Remains inpatient appropriate because: Still with pain.   Estimated body mass index is 25.39 kg/m as calculated from the following:   Height as of this encounter: _0  (1.727 m).   Weight as of this encounter: 75.8 kg.    Nutritional Assessment: Body mass index is 25.39 kg/m.Marland Kitchen Seen by dietician.  I agree with the assessment and plan as outlined below: Nutrition Status:        . Skin Assessment: I have examined  the patient's skin and I agree with the wound assessment as performed by the wound care RN as outlined below:    Consultants:  None  Procedures:  None  Antimicrobials:  Anti-infectives (From admission, onward)    Start     Dose/Rate Route Frequency Ordered Stop   12/17/22 0000  ceFEPIme  (MAXIPIME) 2 g in sodium chloride 0.9 % 100 mL IVPB        2 g 200 mL/hr over 30 Minutes Intravenous Every 8 hours 12/16/22 2226     12/16/22 2100  cefTRIAXone (ROCEPHIN) 2 g in sodium chloride 0.9 % 100 mL IVPB        2 g 200 mL/hr over 30 Minutes Intravenous  Once 12/16/22 2058 12/16/22 2240   12/16/22 2100  metroNIDAZOLE (FLAGYL) IVPB 500 mg        500 mg 100 mL/hr over 60 Minutes Intravenous  Once 12/16/22 2058 12/16/22 2352         Subjective:  Seen and examined.  Still complains of abdominal pain which is better.  Denies any nausea or vomiting but has poor appetite and thus she has not tried the full liquid diet yet.  I encouraged her to try that.  Objective: Vitals:   12/17/22 2343 12/18/22 0553 12/18/22 0723 12/18/22 1203  BP: (!) 155/107 (!) 155/110 (!) 141/95 (!) 134/94  Pulse: (!) 121 (!) 126 (!) 116 (!) 122  Resp:      Temp: 99.8 F (37.7 C) 99.2 F (37.3 C)    TempSrc: Oral Oral    SpO2: 98% 98% 97%   Weight:      Height:        Intake/Output Summary (Last 24 hours) at 12/18/2022 1322 Last data filed at 12/17/2022 2107 Gross per 24 hour  Intake 180 ml  Output --  Net 180 ml   Filed Weights   12/16/22 1546  Weight: 75.8 kg    Examination:  General exam: Appears calm and comfortable  Respiratory system: Clear to auscultation. Respiratory effort normal. Cardiovascular system: S1 & S2 heard, RRR. No JVD, murmurs, rubs, gallops or clicks. No pedal edema. Gastrointestinal system: Abdomen is soft but slightly distended and tender to palpation in the epigastric, right upper quadrant and left upper quadrant area, no organomegaly or masses felt. Normal bowel sounds heard. Central nervous system: Alert and oriented. No focal neurological deficits. Extremities: Symmetric 5 x 5 power. Skin: No rashes, lesions or ulcers Psychiatry: Judgement and insight appear normal. Mood & affect appropriate.    Data Reviewed: I have personally reviewed following labs and imaging  studies  CBC: Recent Labs  Lab 12/16/22 1623 12/16/22 2346 12/18/22 0834  WBC 14.2* 12.1* 11.8*  NEUTROABS 12.3*  --  10.1*  HGB 12.8 11.3* 9.6*  HCT 38.9 35.2* 28.3*  MCV 93.5 94.9 92.5  PLT 376 303 381   Basic Metabolic Panel: Recent Labs  Lab 12/16/22 1623 12/16/22 2346 12/18/22 0834  NA 134*  --  136  K 4.5  --  3.1*  CL 100  --  103  CO2 16*  --  21*  GLUCOSE 84  --  97  BUN 9  --  <5*  CREATININE 0.72 0.77 0.57  CALCIUM 9.3  --  8.8*  MG  --   --  1.4*   GFR: Estimated Creatinine Clearance: 98.1 mL/min (by C-G formula based on SCr of 0.57 mg/dL). Liver Function Tests: Recent Labs  Lab 12/16/22 1623 12/18/22 0834  AST 33 9*  ALT  17 8  ALKPHOS 77 45  BILITOT 2.5* 1.0  PROT 8.2* 6.4*  ALBUMIN 3.6 2.6*   Recent Labs  Lab 12/16/22 1623  LIPASE 43   No results for input(s): "AMMONIA" in the last 168 hours. Coagulation Profile: No results for input(s): "INR", "PROTIME" in the last 168 hours. Cardiac Enzymes: No results for input(s): "CKTOTAL", "CKMB", "CKMBINDEX", "TROPONINI" in the last 168 hours. BNP (last 3 results) No results for input(s): "PROBNP" in the last 8760 hours. HbA1C: No results for input(s): "HGBA1C" in the last 72 hours. CBG: No results for input(s): "GLUCAP" in the last 168 hours. Lipid Profile: No results for input(s): "CHOL", "HDL", "LDLCALC", "TRIG", "CHOLHDL", "LDLDIRECT" in the last 72 hours. Thyroid Function Tests: Recent Labs    12/16/22 1623  TSH 0.495  FREET4 1.74*   Anemia Panel: No results for input(s): "VITAMINB12", "FOLATE", "FERRITIN", "TIBC", "IRON", "RETICCTPCT" in the last 72 hours. Sepsis Labs: Recent Labs  Lab 12/16/22 2346  LATICACIDVEN 0.8    Recent Results (from the past 240 hour(s))  Resp panel by RT-PCR (RSV, Flu A&B, Covid) Anterior Nasal Swab     Status: None   Collection Time: 12/16/22  4:04 PM   Specimen: Anterior Nasal Swab  Result Value Ref Range Status   SARS Coronavirus 2 by RT PCR  NEGATIVE NEGATIVE Final    Comment: (NOTE) SARS-CoV-2 target nucleic acids are NOT DETECTED.  The SARS-CoV-2 RNA is generally detectable in upper respiratory specimens during the acute phase of infection. The lowest concentration of SARS-CoV-2 viral copies this assay can detect is 138 copies/mL. A negative result does not preclude SARS-Cov-2 infection and should not be used as the sole basis for treatment or other patient management decisions. A negative result may occur with  improper specimen collection/handling, submission of specimen other than nasopharyngeal swab, presence of viral mutation(s) within the areas targeted by this assay, and inadequate number of viral copies(<138 copies/mL). A negative result must be combined with clinical observations, patient history, and epidemiological information. The expected result is Negative.  Fact Sheet for Patients:  EntrepreneurPulse.com.au  Fact Sheet for Healthcare Providers:  IncredibleEmployment.be  This test is no t yet approved or cleared by the [place] FDA and  has been authorized for detection and/or diagnosis of SARS-CoV-2 by FDA under an Emergency Use Authorization (EUA). This EUA will remain  in effect (meaning this test can be used) for the duration of the COVID-19 declaration under Section 564(b)(1) of the Act, 21 U.S.C.section 360bbb-3(b)(1), unless the authorization is terminated  or revoked sooner.       Influenza A by PCR NEGATIVE NEGATIVE Final   Influenza B by PCR NEGATIVE NEGATIVE Final    Comment: (NOTE) The Xpert Xpress SARS-CoV-2/FLU/RSV plus assay is intended as an aid in the diagnosis of influenza from Nasopharyngeal swab specimens and should not be used as a sole basis for treatment. Nasal washings and aspirates are unacceptable for Xpert Xpress SARS-CoV-2/FLU/RSV testing.  Fact Sheet for Patients: EntrepreneurPulse.com.au  Fact Sheet for  Healthcare Providers: IncredibleEmployment.be  This test is not yet approved or cleared by the [place] FDA and has been authorized for detection and/or diagnosis of SARS-CoV-2 by FDA under an Emergency Use Authorization (EUA). This EUA will remain in effect (meaning this test can be used) for the duration of the COVID-19 declaration under Section 564(b)(1) of the Act, 21 U.S.C. section 360bbb-3(b)(1), unless the authorization is terminated or revoked.     Resp Syncytial Virus by PCR NEGATIVE NEGATIVE Final  Comment: (NOTE) Fact Sheet for Patients: EntrepreneurPulse.com.au  Fact Sheet for Healthcare Providers: IncredibleEmployment.be  This test is not yet approved or cleared by the [place] FDA and has been authorized for detection and/or diagnosis of SARS-CoV-2 by FDA under an Emergency Use Authorization (EUA). This EUA will remain in effect (meaning this test can be used) for the duration of the COVID-19 declaration under Section 564(b)(1) of the Act, 21 U.S.C. section 360bbb-3(b)(1), unless the authorization is terminated or revoked.  Performed at [facility], [place] [address] [place], [place] [address]   Culture, blood (Routine X 2) w Reflex to ID Panel     Status: None (Preliminary result)   Collection Time: 12/16/22 11:46 PM   Specimen: BLOOD RIGHT HAND  Result Value Ref Range Status   Specimen Description BLOOD RIGHT HAND  Final   Special Requests   Final    BOTTLES DRAWN AEROBIC AND ANAEROBIC Blood Culture adequate volume   Culture   Final    NO GROWTH 2 DAYS Performed at [facility], [place] [address], [place], [place] [address]    Report Status PENDING  Incomplete     Radiology Studies: DG Chest Portable 1 View  Result Date: 12/16/2022 CLINICAL DATA:  Pancreatic stent placed on 12/10/2022 due to chronic pancreatitis, nausea and vomiting, initial encounter EXAM: PORTABLE CHEST 1 VIEW  COMPARISON:  08/11/2022 FINDINGS: Cardiac shadow is within normal limits. Tortuous thoracic aorta is noted. Lungs are well aerated bilaterally. No bony abnormality is noted. IMPRESSION: No active disease. Electronically Signed   By: Inez Catalina M.D.   On: 12/16/2022 21:33   CT ABDOMEN PELVIS W CONTRAST  Result Date: 12/16/2022 CLINICAL DATA:  Epigastric pain post ERCP with stent for chronic pancreatitis December 16 EXAM: CT ABDOMEN AND PELVIS WITH CONTRAST TECHNIQUE: Multidetector CT imaging of the abdomen and pelvis was performed using the standard protocol following bolus administration of intravenous contrast. RADIATION DOSE REDUCTION: This exam was performed according to the departmental dose-optimization program which includes automated exposure control, adjustment of the mA and/or kV according to patient size and/or use of iterative reconstruction technique. CONTRAST:  47m OMNIPAQUE IOHEXOL 350 MG/ML SOLN COMPARISON:  CT December 07, 2022. FINDINGS: Lower chest: Patchy ground-glass opacities in the bilateral lung bases favored infectious or inflammatory. Hepatobiliary: No suspicious hepatic lesion. Gallbladder is distended without focal wall thickening. No biliary ductal dilation. Pancreas: Stent in the main pancreatic duct without pancreatic ductal dilation. Pancreas appears more edematous with increased peripancreatic stranding. Foci of hypoenhancement in the pancreatic body/tail for instance on image 25/3. Small peripancreatic fluid collection measuring 2 cm between the tail of the pancreas and splenic hilum. Spleen: Normal size spleen. Adrenals/Urinary Tract: Bilateral adrenal glands appear normal. No hydronephrosis. Kidneys demonstrate symmetric enhancement. Urinary bladder is unremarkable for degree of distension. Stomach/Bowel: Marked wall edema of the gastric cardia and fundus. No pathologic dilation of small or large bowel. Moderate volume of formed stool in the colon. 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 Similar extrinsic narrowing of the portal vein and splenic vein near the confluence. SMV appears patent. Numerous compensatory venous collaterals similar prior. No pathologically enlarged abdominal or pelvic lymph nodes. Reproductive: Leiomyomatous uterus. Other: No pneumoperitoneum. Musculoskeletal: No acute osseous abnormality. IMPRESSION: 1. Acute on chronic pancreatitis with areas of hypoenhancement of the pancreatic body/tail suggestive of necrosis. Stent  of the pancreatic duct without pancreatic ductal dilation. 2. New small peripancreatic fluid collection measuring 2 cm between the tail of the pancreas and splenic hilum. 3. Marked wall edema of the gastric cardia and fundus, likely reactive. 4. 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 5. Patchy ground-glass opacities in the bilateral lung bases favored infectious or inflammatory. Electronically Signed   By: Dahlia Bailiff M.D.   On: 12/16/2022 20:38    Scheduled Meds:  amLODipine  10 mg Oral Daily   cloNIDine  0.1 mg Oral TID   cyanocobalamin  1,000 mcg Oral Daily   enoxaparin (LOVENOX) injection  40 mg Subcutaneous QHS   folic acid  1 mg Oral Daily   hydrALAZINE  100 mg Oral TID   lipase/protease/amylase  36,000 Units Oral TID WC   losartan  100 mg Oral Daily   multivitamin with minerals  1 tablet Oral Daily   nicotine  21 mg Transdermal Daily   pantoprazole  40 mg Oral BID AC   potassium chloride  40 mEq Oral Q4H   thiamine  100 mg Oral Daily   Continuous Infusions:  ceFEPime (MAXIPIME) IV 2 g (12/18/22 0746)   dextrose 5% lactated ringers 75 mL/hr at 12/17/22 1832   magnesium  sulfate bolus IVPB       LOS: 2 days   Darliss Cheney, MD [facility]  12/18/2022, 1:22 PM   *Please note that this is a verbal dictation therefore any spelling or grammatical errors are due to the "[employer] One" system interpretation.  Please page via [place] and do not message via secure chat for urgent patient care matters. Secure chat can be used for non urgent patient care matters.  How to contact the [facility] Attending or Consulting provider [place] or covering provider during after hours [place], for this patient?  Check the care team in [facility] and look for a) attending/consulting TR[facility] provider listed and b) the [facility] team listed. Page or secure chat [phone]. Log into [email or web address] and use [place]'s universal password to access. If you do not have the password, please contact the hospital operator. Locate the [facility] provider you are looking for under [facility] and page to a number that you can be directly reached. If you still have difficulty reaching the provider, please page the [facility] (Director on Call) for the Hospitalists listed on amion for assistance.

## 2022-12-18 NOTE — Discharge Instructions (Addendum)
Recommendations for Outpatient Follow-up:  Follow up with PCP in 1-2 weeks Please obtain BMP/CBC in one week Follow-up with your primary GI in 1 to 2 weeks Please follow up with your PCP on the following pending results:   Pancreatitis Nutrition Therapy  The pancreas helps your body digest and absorb nutrients in food. Pancreatitis prevents the body from digesting food well, especially if the food is high in fat.  This nutrition therapy limits the fat in your diet while providing nutrients you need. Your goal is to eat as near to a normal diet as possible without experiencing gastrointestinal (GI) symptoms. These symptoms include stomach pain, bloating, weight loss or difficulty maintaining weight, vomiting, burping, loose stools, and steatorrhea. The effects of pancreatitis are different for all individuals, so it is important to work with your registered dietitian nutritionist (RDN) to determine which foods trigger your GI symptoms. Your RDN can also help you figure out your tolerance to fat in foods and how to manage your symptoms with your diet.  Tips You may need to take pancreatic enzymes if you have frequent, loose stools after mealtimes. Individuals who take pancreatic enzymes will need to take the prescribed dosage at the start of a meal or snack. Individuals who are prescribed a low-fat diet will need to limit fats and oils to no more than 6 teaspoons (30 grams) daily. Up to 1 ounce of avocado can also be substituted for 1 teaspoon of fat. A low-fat diet may not be needed if you are taking pancreatic enzymes. Avoid drinking alcohol. Keep a bottle of water with you at all times to stay hydrated and ensure you are getting in enough fluid each day. The general recommendation is to drink 8 cups (64 ounces) of fluids daily. Eat small, frequent meals (4-6) throughout the day to help you recover from pancreatitis or to maintain your normal body weight. Eat more whole fruits and vegetables  rather than drinking fruit and vegetable juices.  Fiber is found in whole grain foods and slows digestion. You may need to choose whole grain foods less often if you feel full quickly after eating. Ask your RDN for recommendations on managing your diet if you also have other conditions, such as diabetes mellitus. Your RDN might recommend a vitamin and mineral supplement or fat-soluble vitamin supplements if you require higher amounts of these nutrients.  Foods Recommended and Foods Not Recommended The following list of foods may be helpful if you need to limit your fat intake. Food Group Foods Recommended Foods Not Recommended  Grains Breads: Bagels, buns, English muffins Hot/cold cereals Couscous Low-fat crackers Pancakes Pasta Popcorn, air popped Rice Corn or flour tortilla Products made with added fat (such as biscuits, waffles, and regular crackers) High-fat bakery products such as doughnuts, biscuits, croissants, Danish pastries, pies, cookies Snacks made with partially hydrogenated oils including chips, cheese puffs, snack mixes, regular crackers, butter-flavored popcorn  Protein Foods Lean cuts of poultry (without skin) such as chicken or [place] Low-fat hamburger (for example, 7% fat) Lean cuts of fish (white fish) Canned tuna in water Egg whites or egg substitute Lean deli meats such as [place], chicken, lean beef Non-animal protein sources (tofu, legumes, beans, lentils) Smooth nut butters     Higher-fat cuts of meats such as ribs, T-bone steak, regular hamburger (15% to 20% fat) Full-fat processed meats (hot dogs, bologna, salami, sausage, bacon, etc) Red meats Organ meats (liver, brains, sweetbreads) Poultry with skin Fried meat, poultry, tofu, and fish Whole eggs and egg  yolks Full-fat refried beans Tree nuts and peanuts  Dairy and Dairy Alternatives 1% or fat-free dairy (milk, yogurt, cheese, cottage cheese, sour cream) Frozen yogurt Fortified non-dairy milk  (almond, rice, soy, etc.) Creamy/cheesy sauces Cream Whole-fat or reduced-fat (2%) dairy (milk, yogurt, ice cream, cheese) Milkshakes Half-and-half Cream cheese Sour cream Coconut milk  Vegetables All fresh, frozen, or canned vegetables Fried or stir-fried vegetables Vegetables prepared with butter, cheese, or cream sauce  Fruit All fresh, frozen, or canned fruit Fried fruits Fruit served with butter or cream  Fats and Oils All vegetable oils   Butter, stick margarine, shortening, partially hydrogenated oils, tropical oils (coconut, palm, and palm kernel oils)   Pancreatitis Sample 1-Day Menu View Nutrient Info Breakfast 2 egg whites, cooked 1 whole wheat bagel 1 tablespoon low-fat cream cheese 1 cup fat-free milk  cup blueberries  Lunch 2 slices bread 2 ounces [place] breast 2 leaves lettuce 2 slices tomato 1 teaspoon mustard 2 teaspoons nonfat mayonnaise 1 cup carrots  cup pineapple 1 cup fat-free milk  Evening Meal 3 ounces tilapia, baked  cup cooked rice  cup zucchini 1 cup lettuce for salad 2 teaspoons fat-free [place] dressing, for salad 1 dinner roll 1 teaspoon margarine  Evening Snack  cup low-fat yogurt 1 cup strawberries 1 ounce pretzels  Daily Sum Nutrient Unit Value  Macronutrients  Energy kcal 1560  Energy kJ 6541  Protein g 103  Total lipid (fat) g 23  Carbohydrate, by difference g 241  Fiber, total dietary g 23  Sugars, total g 104  Minerals  Calcium, Ca mg 1159  Iron, Fe mg 12  Sodium, Na mg 2034  Vitamins  Vitamin C, total ascorbic acid mg 158  Vitamin A, IU IU 28445  Vitamin D IU 457  Lipids  Fatty acids, total saturated g 6  Fatty acids, total monounsaturated g 6  Fatty acids, total polyunsaturated g 8  Cholesterol mg 123

## 2022-12-18 NOTE — Progress Notes (Signed)
Initial Nutrition Assessment  DOCUMENTATION CODES:   Severe malnutrition in context of chronic illness  INTERVENTION:   Continue Multivitamin w/ minerals daily Boost Breeze po TID, each supplement provides 250 kcal and 9 grams of protein Encourage good PO intake Diet education in AVS If PO intake does not improve in the next 24-48 hours, would recommend placement of Cortrak and initiate enteral nutrition: Start Osmolite 1.5 at 20 mL/hr and advance by 10 mL every 12 hours to goal rate of 60 mL/hr, as tolerated 60 ,mL ProSource TF20 - BID 180 mL free water flush q4h Provides 2320 kcal, 130 gm protein, and 2207 mL total free water daily.  NUTRITION DIAGNOSIS:   Severe Malnutrition related to chronic illness (pancreatitis) as evidenced by percent weight loss, energy intake < or equal to 75% for > or equal to 1 month, moderate muscle depletion.  GOAL:   Patient will meet greater than or equal to 90% of their needs  MONITOR:   PO intake, Supplement acceptance, Diet advancement, Labs, I & O's  REASON FOR ASSESSMENT:   Malnutrition Screening Tool    ASSESSMENT:   37 y.o. female presented to the ED with nausea and severe abdominal pain. PMH includes HTN, EtOH abuse, chronic pancreatitis, tobacco abuse, GERD, and gastric varices. Pt admitted with acute on chronic pancreatitis and sepsis.   1/01 - Admitted 1/02 - diet advanced to clear liquids 1/03 - diet advanced to full liquids  Pt reports that her appetite and PO intake has been poor since June of 2023. States that some days she will eat one full meal and then some days will eat something small like mashed potatoes, or not eat anything. Pt shares that over the past 7 days she has had one small portion of mashed potatoes. Did not do any liquids yesterday and tried full liquids today, but did not eat much. Reports that June is when she started having more symptoms related to the pancreatitis, symptoms include abdominal pain and  nausea. She said that she just does not have an appetite often, states that she will every now and then have a  taste for something but can only eat a little. Pt endorses significant weight loss over the past 6 months. Shares that this is the smallest she has every been. Includes that she has never been able to see her clavicle and now she can. Reports that she goes to the doctors frequently and that every time that she goes, her weight is less. States that she is now typically between the 160-170# range and use to be 250#. Per EMR, pt has had a 25.7% weight loss within 10 months, this is clinically significant for time frame. Based on pt weight history and physical exam, pt meets criteria for malnutrition.  Wt Readings from Last 13 Encounters:  12/16/22 75.8 kg  11/03/22 76.2 kg  10/30/22 79.4 kg  09/11/22 78.5 kg  07/30/22 82.1 kg  07/05/22 85.3 kg  06/11/22 90.7 kg  06/06/22 88 kg  04/22/22 95 kg  04/08/22 95.7 kg  04/02/22 96.2 kg  03/08/22 100.2 kg  02/24/22 102 kg   Discussed with pt trial of Boost Breeze to help with caloric and protein intake. RD explained that if PO intake does not improve, may need to proceed with placement of Cortrak until pt is able to tolerate PO. Pt expressed understanding. RD reached out to MD to discuss.   Pt asked about PCP and help with diet outpatient. Pt says that she does not  have a PCP. RD reached out to CM and SW to help with PCP set-up.   Medications reviewed and include: Vitamin J33, Folic acid, creon, MVI, Protonix, Thiamine, IV antibiotics  Labs reviewed: Potassium 3.1, BUN <5, Magnesium 1.4  NUTRITION - FOCUSED PHYSICAL EXAM:  Flowsheet Row Most Recent Value  Orbital Region No depletion  Upper Arm Region No depletion  Thoracic and Lumbar Region No depletion  Buccal Region No depletion  Temple Region No depletion  Clavicle Bone Region Mild depletion  Clavicle and Acromion Bone Region Mild depletion  Scapular Bone Region Mild depletion   Dorsal Hand No depletion  Patellar Region Moderate depletion  Anterior Thigh Region Moderate depletion  Posterior Calf Region Moderate depletion  Edema (RD Assessment) None  Hair Reviewed  Eyes Reviewed  Mouth Reviewed  Skin Reviewed  Nails Unable to assess  [Nail polish]   Diet Order:   Diet Order             DIET DYS 3 Room service appropriate? Yes; Fluid consistency: Thin  Diet effective now                  EDUCATION NEEDS:   Education needs have been addressed  Skin:  Skin Assessment: Reviewed RN Assessment  Last BM:  Uknown  Height:  Ht Readings from Last 1 Encounters:  12/16/22 '5\' 8"'$  (1.727 m)   Weight:  Wt Readings from Last 1 Encounters:  12/16/22 75.8 kg   Ideal Body Weight:  63.6 kg  BMI:  Body mass index is 25.39 kg/m.  Estimated Nutritional Needs:  Kcal:  2200-2400 Protein:  110-130 grams Fluid:  >/= 2 L   Hermina Barters RD, LDN Clinical Dietitian See [facility] for contact information.

## 2022-12-18 NOTE — Plan of Care (Signed)

## 2022-12-19 DIAGNOSIS — K859 Acute pancreatitis without necrosis or infection, unspecified: Secondary | ICD-10-CM | POA: Diagnosis not present

## 2022-12-19 DIAGNOSIS — K861 Other chronic pancreatitis: Secondary | ICD-10-CM | POA: Diagnosis not present

## 2022-12-19 DIAGNOSIS — E43 Unspecified severe protein-calorie malnutrition: Secondary | ICD-10-CM | POA: Insufficient documentation

## 2022-12-19 LAB — CBC WITH DIFFERENTIAL/PLATELET
Abs Immature Granulocytes: 0.07 10*3/uL (ref 0.00–0.07)
Basophils Absolute: 0 10*3/uL (ref 0.0–0.1)
Basophils Relative: 0 %
Eosinophils Absolute: 0 10*3/uL (ref 0.0–0.5)
Eosinophils Relative: 1 %
HCT: 25.3 % — ABNORMAL LOW (ref 36.0–46.0)
Hemoglobin: 8.1 g/dL — ABNORMAL LOW (ref 12.0–15.0)
Immature Granulocytes: 1 %
Lymphocytes Relative: 13 %
Lymphs Abs: 1 10*3/uL (ref 0.7–4.0)
MCH: 30.1 pg (ref 26.0–34.0)
MCHC: 32 g/dL (ref 30.0–36.0)
MCV: 94.1 fL (ref 80.0–100.0)
Monocytes Absolute: 0.5 10*3/uL (ref 0.1–1.0)
Monocytes Relative: 7 %
Neutro Abs: 6 10*3/uL (ref 1.7–7.7)
Neutrophils Relative %: 78 %
Platelets: 180 10*3/uL (ref 150–400)
RBC: 2.69 MIL/uL — ABNORMAL LOW (ref 3.87–5.11)
RDW: 13.5 % (ref 11.5–15.5)
WBC: 7.5 10*3/uL (ref 4.0–10.5)
nRBC: 0 % (ref 0.0–0.2)

## 2022-12-19 LAB — BASIC METABOLIC PANEL
Anion gap: 6 (ref 5–15)
BUN: <5 mg/dL — ABNORMAL LOW (ref 6–20)
CO2: 24 mmol/L (ref 22–32)
Calcium: 8.2 mg/dL — ABNORMAL LOW (ref 8.9–10.3)
Chloride: 105 mmol/L (ref 98–111)
Creatinine, Ser: 0.63 mg/dL (ref 0.44–1.00)
GFR, Estimated: >60 mL/min (ref >60)
Glucose, Bld: 89 mg/dL (ref 70–99)
Potassium: 3.1 mmol/L — ABNORMAL LOW (ref 3.5–5.1)
Sodium: 135 mmol/L (ref 135–145)

## 2022-12-19 LAB — MAGNESIUM: Magnesium: 2.1 mg/dL (ref 1.7–2.4)

## 2022-12-19 MED ORDER — POTASSIUM CHLORIDE CRYS ER 20 MEQ PO TBCR
40.0000 meq | EXTENDED_RELEASE_TABLET | ORAL | Status: AC
  Administered 2022-12-19 (×2): 40 meq via ORAL
  Filled 2022-12-19 (×2): qty 2

## 2022-12-19 NOTE — Progress Notes (Signed)
PROGRESS NOTE    Robin Guerrero  TKW:[id number] DOB: 10/29/1986 DOA: 12/16/2022 PCP: Patient, No Pcp Per   Brief Narrative:  HPI: Robin Guerrero is a 37 y.o. female with medical history significant of alcoholic pancreatitis, chronic recurrent pancreatitis with pseudocyst status post stent placement last week at [facility], stable descending thoracic dissection, essential hypertension who presents to the ER with nausea and abdominal pain rated as 10 out of 10.  Pain is centrally located radiating to the epigastric region.  Associated with nausea and not relieved by anything.  Pain is worse with food.  Patient was seen and evaluated in the ER.  She is seen bending over and apparent severe pain.  Her workup shows normal lipase but CT abdomen pelvis showed acute on chronic pancreatitis with possible necrosis involving the body and tail of the pancreas.  Also questionable bilateral lower lobe infiltrates.  Patient is being admitted mainly for pain control.  No fever or chills.  She has significant leukocytosis.  Otherwise no other significant findings.  Patient has met SIRS criteria with tachycardia heart rate up to 140s leukocytosis as well as possible pneumonia indicating sepsis.   Assessment & Plan:   Principal Problem:   Acute on chronic pancreatitis (HCC) Active Problems:   Essential hypertension   GERD without esophagitis   Hypokalemia   Hypomagnesemia   Alcohol-induced chronic pancreatitis (HCC)   Alcohol use disorder, severe, in early remission ([name])   Sepsis due to pneumonia ([name])   Protein-calorie malnutrition, severe  Acute on chronic pancreatitis: Has a history of chronic alcoholic pancreatitis.  She denies drinking alcohol for the very long time.  CT abdomen shows acute pancreatitis.  Lipase normal.  She is improving, will advance to soft diet.  Sepsis secondary to healthcare associated pneumonia: CT abdomen shows bibasilar infiltrates, could be just atelectasis however  she has tachycardia and leukocytosis so she was started on cefepime which I will continue.  Procalcitonin 2.77.  History of aortic dissection: Appears to be stable.  Tobacco abuse: Nicotine patch.  Hypomagnesemia: Will replace.  Hypokalemia: Low again, will repeat.  Hypertensive urgency: Blood pressure significantly elevated upon arrival, resumed.  home amlodipine, clonidine, hydralazine, losartan , blood pressure now much better controlled.   Severe protein calorie malnutrition: Patient was assessed by nutritionist, due to weight loss and poor appetite, she has severe protein calorie malnutrition, they recommended watching patient for 1 to 2 days to see how much of the meal she is eating, if not enough then she will need tube feedings.  I have discussed this with the patient.  She does not want the tube feedings, she had consumed less than 50% of breakfast this morning when I saw her, she said that she will try to eat more.  DVT prophylaxis: enoxaparin (LOVENOX) injection 40 mg Start: 12/16/22 2245   Code Status: Full Code  Family Communication:  None present at bedside.  Plan of care discussed with patient in length and he/she verbalized understanding and agreed with it.  Status is: Inpatient Remains inpatient appropriate because: Still with pain.   Estimated body mass index is 25.39 kg/m as calculated from the following:   Height as of this encounter: _0  (1.727 m).   Weight as of this encounter: 75.8 kg.    Nutritional Assessment: Body mass index is 25.39 kg/m.Marland Kitchen Seen by dietician.  I agree with the assessment and plan as outlined below: Nutrition Status: Nutrition Problem: Severe Malnutrition Etiology: chronic illness (pancreatitis) Signs/Symptoms: percent weight loss, energy  intake < or equal to 75% for > or equal to 1 month, moderate muscle depletion Percent weight loss: 25.7 % Interventions: Boost Breeze, MVI, Refer to RD note for recommendations  . Skin  Assessment: I have examined the patient's skin and I agree with the wound assessment as performed by the wound care RN as outlined below:    Consultants:  None  Procedures:  None  Antimicrobials:  Anti-infectives (From admission, onward)    Start     Dose/Rate Route Frequency Ordered Stop   12/17/22 0000  ceFEPIme (MAXIPIME) 2 g in sodium chloride 0.9 % 100 mL IVPB        2 g 200 mL/hr over 30 Minutes Intravenous Every 8 hours 12/16/22 2226     12/16/22 2100  cefTRIAXone (ROCEPHIN) 2 g in sodium chloride 0.9 % 100 mL IVPB        2 g 200 mL/hr over 30 Minutes Intravenous  Once 12/16/22 2058 12/16/22 2240   12/16/22 2100  metroNIDAZOLE (FLAGYL) IVPB 500 mg        500 mg 100 mL/hr over 60 Minutes Intravenous  Once 12/16/22 2058 12/16/22 2352         Subjective:  Seen and examined.  She states that her abdominal pain is improved.  She has no nausea but she has poor appetite.  Objective: Vitals:   12/18/22 2114 12/19/22 0011 12/19/22 0638 12/19/22 1108  BP: 117/82 109/78 (!) 123/92 115/82  Pulse: (!) 107 (!) 102 96 97  Resp:    17  Temp: 98.2 F (36.8 C) 98.6 F (37 C) 98.3 F (36.8 C)   TempSrc: Oral Oral Oral   SpO2: 97% 97% 97% 100%  Weight:      Height:        Intake/Output Summary (Last 24 hours) at 12/19/2022 1403 Last data filed at 12/19/2022 1030 Gross per 24 hour  Intake 2541.58 ml  Output --  Net 2541.58 ml    Filed Weights   12/16/22 1546  Weight: 75.8 kg    Examination:  General exam: Appears calm and comfortable  Respiratory system: Clear to auscultation. Respiratory effort normal. Cardiovascular system: S1 & S2 heard, RRR. No JVD, murmurs, rubs, gallops or clicks. No pedal edema. Gastrointestinal system: Abdomen is nondistended, soft and mild to moderate epigastric, right upper quadrant and left upper quadrant tenderness. No organomegaly or masses felt. Normal bowel sounds heard. Central nervous system: Alert and oriented. No focal neurological  deficits. Extremities: Symmetric 5 x 5 power. Skin: No rashes, lesions or ulcers.   Data Reviewed: I have personally reviewed following labs and imaging studies  CBC: Recent Labs  Lab 12/16/22 1623 12/16/22 2346 12/18/22 0834 12/19/22 0301  WBC 14.2* 12.1* 11.8* 7.5  NEUTROABS 12.3*  --  10.1* 6.0  HGB 12.8 11.3* 9.6* 8.1*  HCT 38.9 35.2* 28.3* 25.3*  MCV 93.5 94.9 92.5 94.1  PLT 376 303 192 324    Basic Metabolic Panel: Recent Labs  Lab 12/16/22 1623 12/16/22 2346 12/18/22 0834 12/19/22 0301  NA 134*  --  136 135  K 4.5  --  3.1* 3.1*  CL 100  --  103 105  CO2 16*  --  21* 24  GLUCOSE 84  --  97 89  BUN 9  --  <5* <5*  CREATININE 0.72 0.77 0.57 0.63  CALCIUM 9.3  --  8.8* 8.2*  MG  --   --  1.4* 2.1    GFR: Estimated Creatinine Clearance: 98.1 mL/min (by  C-G formula based on SCr of 0.63 mg/dL). Liver Function Tests: Recent Labs  Lab 12/16/22 1623 12/18/22 0834  AST 33 9*  ALT 17 8  ALKPHOS 77 45  BILITOT 2.5* 1.0  PROT 8.2* 6.4*  ALBUMIN 3.6 2.6*    Recent Labs  Lab 12/16/22 1623  LIPASE 43    No results for input(s): "AMMONIA" in the last 168 hours. Coagulation Profile: No results for input(s): "INR", "PROTIME" in the last 168 hours. Cardiac Enzymes: No results for input(s): "CKTOTAL", "CKMB", "CKMBINDEX", "TROPONINI" in the last 168 hours. BNP (last 3 results) No results for input(s): "PROBNP" in the last 8760 hours. HbA1C: No results for input(s): "HGBA1C" in the last 72 hours. CBG: No results for input(s): "GLUCAP" in the last 168 hours. Lipid Profile: No results for input(s): "CHOL", "HDL", "LDLCALC", "TRIG", "CHOLHDL", "LDLDIRECT" in the last 72 hours. Thyroid Function Tests: Recent Labs    12/16/22 1623  TSH 0.495  FREET4 1.74*    Anemia Panel: No results for input(s): "VITAMINB12", "FOLATE", "FERRITIN", "TIBC", "IRON", "RETICCTPCT" in the last 72 hours. Sepsis Labs: Recent Labs  Lab 12/16/22 2346 12/18/22 0834   PROCALCITON  --  2.77  LATICACIDVEN 0.8  --      Recent Results (from the past 240 hour(s))  Resp panel by RT-PCR (RSV, Flu A&B, Covid) Anterior Nasal Swab     Status: None   Collection Time: 12/16/22  4:04 PM   Specimen: Anterior Nasal Swab  Result Value Ref Range Status   SARS Coronavirus 2 by RT PCR NEGATIVE NEGATIVE Final    Comment: (NOTE) SARS-CoV-2 target nucleic acids are NOT DETECTED.  The SARS-CoV-2 RNA is generally detectable in upper respiratory specimens during the acute phase of infection. The lowest concentration of SARS-CoV-2 viral copies this assay can detect is 138 copies/mL. A negative result does not preclude SARS-Cov-2 infection and should not be used as the sole basis for treatment or other patient management decisions. A negative result may occur with  improper specimen collection/handling, submission of specimen other than nasopharyngeal swab, presence of viral mutation(s) within the areas targeted by this assay, and inadequate number of viral copies(<138 copies/mL). A negative result must be combined with clinical observations, patient history, and epidemiological information. The expected result is Negative.  Fact Sheet for Patients:  EntrepreneurPulse.com.au  Fact Sheet for Healthcare Providers:  IncredibleEmployment.be  This test is no t yet approved or cleared by the [place] FDA and  has been authorized for detection and/or diagnosis of SARS-CoV-2 by FDA under an Emergency Use Authorization (EUA). This EUA will remain  in effect (meaning this test can be used) for the duration of the COVID-19 declaration under Section 564(b)(1) of the Act, 21 U.S.C.section 360bbb-3(b)(1), unless the authorization is terminated  or revoked sooner.       Influenza A by PCR NEGATIVE NEGATIVE Final   Influenza B by PCR NEGATIVE NEGATIVE Final    Comment: (NOTE) The Xpert Xpress SARS-CoV-2/FLU/RSV plus assay is intended  as an aid in the diagnosis of influenza from Nasopharyngeal swab specimens and should not be used as a sole basis for treatment. Nasal washings and aspirates are unacceptable for Xpert Xpress SARS-CoV-2/FLU/RSV testing.  Fact Sheet for Patients: EntrepreneurPulse.com.au  Fact Sheet for Healthcare Providers: IncredibleEmployment.be  This test is not yet approved or cleared by the [place] FDA and has been authorized for detection and/or diagnosis of SARS-CoV-2 by FDA under an Emergency Use Authorization (EUA). This EUA will remain in effect (meaning this test  can be used) for the duration of the COVID-19 declaration under Section 564(b)(1) of the Act, 21 U.S.C. section 360bbb-3(b)(1), unless the authorization is terminated or revoked.     Resp Syncytial Virus by PCR NEGATIVE NEGATIVE Final    Comment: (NOTE) Fact Sheet for Patients: EntrepreneurPulse.com.au  Fact Sheet for Healthcare Providers: IncredibleEmployment.be  This test is not yet approved or cleared by the [place] FDA and has been authorized for detection and/or diagnosis of SARS-CoV-2 by FDA under an Emergency Use Authorization (EUA). This EUA will remain in effect (meaning this test can be used) for the duration of the COVID-19 declaration under Section 564(b)(1) of the Act, 21 U.S.C. section 360bbb-3(b)(1), unless the authorization is terminated or revoked.  Performed at [facility], [place] [address] [place], [place] [address]   Culture, blood (Routine X 2) w Reflex to ID Panel     Status: None (Preliminary result)   Collection Time: 12/16/22 11:46 PM   Specimen: BLOOD RIGHT HAND  Result Value Ref Range Status   Specimen Description BLOOD RIGHT HAND  Final   Special Requests   Final    BOTTLES DRAWN AEROBIC AND ANAEROBIC Blood Culture adequate volume   Culture   Final    NO GROWTH 3 DAYS Performed at [facility], [place] [address], [place],  [address]    Report Status PENDING  Incomplete     Radiology Studies: No results found.  Scheduled Meds:  amLODipine  10 mg Oral Daily   cloNIDine  0.1 mg Oral TID   cyanocobalamin  1,000 mcg Oral Daily   enoxaparin (LOVENOX) injection  40 mg Subcutaneous QHS   feeding supplement  1 Container Oral TID BM   folic acid  1 mg Oral Daily   hydrALAZINE  100 mg Oral TID   lipase/protease/amylase  36,000 Units Oral TID WC   losartan  100 mg Oral Daily   multivitamin with minerals  1 tablet Oral Daily   nicotine  21 mg Transdermal Daily   pantoprazole  40 mg Oral BID AC   thiamine  100 mg Oral Daily   Continuous Infusions:  ceFEPime (MAXIPIME) IV 200 mL/hr at 12/19/22 0841   dextrose 5% lactated ringers 125 mL/hr at 12/19/22 1123     LOS: 3 days   Darliss Cheney, MD [facility]  12/19/2022, 2:03 PM   *Please note that this is a verbal dictation therefore any spelling or grammatical errors are due to the "[employer] [employer]" system interpretation.  Please page via [place] and do not message via secure chat for urgent patient care matters. Secure chat can be used for non urgent patient care matters.  How to contact the [facility] Attending or Consulting provider [place] or covering provider during after hours [place], for this patient?  Check the care team in [facility] and look for a) attending/consulting [facility] provider listed and b) the [facility] team listed. Page or secure chat [phone]. Log into [email or web address] and use [place]'s universal password to access. If you do not have the password, please contact the hospital operator. Locate the [facility] provider you are looking for under [facility] and page to a number that you can be directly reached. If you still have difficulty reaching the provider, please page the [facility] (Director on Call) for the Hospitalists listed on amion for assistance.

## 2022-12-19 NOTE — Social Work (Signed)
CSW acknowledges consult for substance use counseling and resources. Pt also with SDOH flags on screening for food and utility insecurity. CSW met with pt at bedside who was initially guarded. Pt states she is frustrated that she keeps being asked about her alcohol intake. She states she drank a lot when her mother passed in 2021, and is still suffering the consequence even though she hasn't drank in two years. She is frustrated by the way she is being perceived as if she is an alcoholic and a drug seeker.  CSW provided emotional support and pt became more comfortable. Pt shared that she does have financial insecurity at this time. Pt shared about her situation and agreeable to resources. CSW provided resources at bedside, and provided therapeutic listening, validating pt's frustration with her current situation.

## 2022-12-20 DIAGNOSIS — K859 Acute pancreatitis without necrosis or infection, unspecified: Secondary | ICD-10-CM | POA: Diagnosis not present

## 2022-12-20 DIAGNOSIS — K861 Other chronic pancreatitis: Secondary | ICD-10-CM | POA: Diagnosis not present

## 2022-12-20 LAB — BASIC METABOLIC PANEL
Anion gap: 3 — ABNORMAL LOW (ref 5–15)
BUN: 9 mg/dL (ref 6–20)
CO2: 22 mmol/L (ref 22–32)
Calcium: 8 mg/dL — ABNORMAL LOW (ref 8.9–10.3)
Chloride: 111 mmol/L (ref 98–111)
Creatinine, Ser: 0.73 mg/dL (ref 0.44–1.00)
GFR, Estimated: >60 mL/min (ref >60)
Glucose, Bld: 112 mg/dL — ABNORMAL HIGH (ref 70–99)
Potassium: 4.4 mmol/L (ref 3.5–5.1)
Sodium: 136 mmol/L (ref 135–145)

## 2022-12-20 NOTE — Progress Notes (Signed)
PROGRESS NOTE    Robin Guerrero  GDJ:[id number] DOB: March 03, 1986 DOA: 12/16/2022 PCP: Patient, No Pcp Per   Brief Narrative:  HPI: Robin Guerrero is a 37 y.o. female with medical history significant of alcoholic pancreatitis, chronic recurrent pancreatitis with pseudocyst status post stent placement last week at [facility], stable descending thoracic dissection, essential hypertension who presents to the ER with nausea and abdominal pain rated as 10 out of 10.  Pain is centrally located radiating to the epigastric region.  Associated with nausea and not relieved by anything.  Pain is worse with food.  Patient was seen and evaluated in the ER.  She is seen bending over and apparent severe pain.  Her workup shows normal lipase but CT abdomen pelvis showed acute on chronic pancreatitis with possible necrosis involving the body and tail of the pancreas.  Also questionable bilateral lower lobe infiltrates.  Patient is being admitted mainly for pain control.  No fever or chills.  She has significant leukocytosis.  Otherwise no other significant findings.  Patient has met SIRS criteria with tachycardia heart rate up to 140s leukocytosis as well as possible pneumonia indicating sepsis.   Assessment & Plan:   Principal Problem:   Acute on chronic pancreatitis (HCC) Active Problems:   Essential hypertension   GERD without esophagitis   Hypokalemia   Hypomagnesemia   Alcohol-induced chronic pancreatitis (HCC)   Alcohol use disorder, severe, in early remission ([place])   Sepsis due to pneumonia ([place])   Protein-calorie malnutrition, severe  Acute on chronic pancreatitis: Has a history of chronic alcoholic pancreatitis.  She denies drinking alcohol for the very long time.  CT abdomen shows acute pancreatitis.  Lipase normal.  She is improving, pain better than yesterday, will advance to regular diet.  Sepsis secondary to healthcare associated pneumonia: CT abdomen shows bibasilar infiltrates, could  be just atelectasis however she has tachycardia and leukocytosis so she was started on cefepime which I will continue.  Procalcitonin 2.77.  Will repeat procalcitonin tomorrow.  History of aortic dissection: Appears to be stable.  Tobacco abuse: Nicotine patch.  Hypomagnesemia: Will replace.  Hypokalemia: Low again, will repeat.  Hypertensive urgency: Blood pressure significantly elevated upon arrival, resumed.  home amlodipine, clonidine, hydralazine, losartan , blood pressure now much better controlled.   Severe protein calorie malnutrition: Per nurses, patient consumed 75% of breakfast, 100% of lunch and dinner yesterday.  We are going to monitor her today with advanced diet and if she consumes most of the meals, and if she feels better by tomorrow, we will discharge her home tomorrow.  DVT prophylaxis: enoxaparin (LOVENOX) injection 40 mg Start: 12/16/22 2245   Code Status: Full Code  Family Communication:  None present at bedside.  Plan of care discussed with patient in length and he/she verbalized understanding and agreed with it.  Status is: Inpatient Remains inpatient appropriate because: Still with pain.   Estimated body mass index is 25.39 kg/m as calculated from the following:   Height as of this encounter: '5\' 8"'$  (1.727 m).   Weight as of this encounter: 75.8 kg.    Nutritional Assessment: Body mass index is 25.39 kg/m.Marland Kitchen Seen by dietician.  I agree with the assessment and plan as outlined below: Nutrition Status: Nutrition Problem: Severe Malnutrition Etiology: chronic illness (pancreatitis) Signs/Symptoms: percent weight loss, energy intake < or equal to 75% for > or equal to 1 month, moderate muscle depletion Percent weight loss: 25.7 % Interventions: Boost Breeze, MVI, Refer to RD note for recommendations  .  Skin Assessment: I have examined the patient's skin and I agree with the wound assessment as performed by the wound care RN as outlined below:     Consultants:  None  Procedures:  None  Antimicrobials:  Anti-infectives (From admission, onward)    Start     Dose/Rate Route Frequency Ordered Stop   12/17/22 0000  ceFEPIme (MAXIPIME) 2 g in sodium chloride 0.9 % 100 mL IVPB        2 g 200 mL/hr over 30 Minutes Intravenous Every 8 hours 12/16/22 2226     12/16/22 2100  cefTRIAXone (ROCEPHIN) 2 g in sodium chloride 0.9 % 100 mL IVPB        2 g 200 mL/hr over 30 Minutes Intravenous  Once 12/16/22 2058 12/16/22 2240   12/16/22 2100  metroNIDAZOLE (FLAGYL) IVPB 500 mg        500 mg 100 mL/hr over 60 Minutes Intravenous  Once 12/16/22 2058 12/16/22 2352         Subjective:  Seen and examined.  Still has abdominal pain but improving very slowly.  No nausea.  No other complaint.  Objective: Vitals:   12/19/22 2026 12/20/22 0016 12/20/22 0534 12/20/22 0813  BP: (!) 138/95 (!) 133/92 (!) 139/93 (!) 140/101  Pulse: 94 95 84 92  Resp: '17 17 17 17  '$ Temp: (!) 97.5 F (36.4 C) 97.8 F (36.6 C) 97.6 F (36.4 C) 97.6 F (36.4 C)  TempSrc: Oral Oral Oral Oral  SpO2: 100% 100%  100%  Weight:      Height:        Intake/Output Summary (Last 24 hours) at 12/20/2022 1201 Last data filed at 12/20/2022 0930 Gross per 24 hour  Intake 2653.67 ml  Output --  Net 2653.67 ml    Filed Weights   12/16/22 1546  Weight: 75.8 kg    Examination:  General exam: Appears calm and comfortable  Respiratory system: Clear to auscultation. Respiratory effort normal. Cardiovascular system: S1 & S2 heard, RRR. No JVD, murmurs, rubs, gallops or clicks. No pedal edema. Gastrointestinal system: Abdomen is nondistended, soft and mild epigastric, right upper quadrant and left upper quadrant tenderness. No organomegaly or masses felt. Normal bowel sounds heard. Central nervous system: Alert and oriented. No focal neurological deficits. Extremities: Symmetric 5 x 5 power. Skin: No rashes, lesions or ulcers.  Psychiatry: Judgement and insight appear  normal. Mood & affect appropriate.    Data Reviewed: I have personally reviewed following labs and imaging studies  CBC: Recent Labs  Lab 12/16/22 1623 12/16/22 2346 12/18/22 0834 12/19/22 0301  WBC 14.2* 12.1* 11.8* 7.5  NEUTROABS 12.3*  --  10.1* 6.0  HGB 12.8 11.3* 9.6* 8.1*  HCT 38.9 35.2* 28.3* 25.3*  MCV 93.5 94.9 92.5 94.1  PLT 376 303 192 448    Basic Metabolic Panel: Recent Labs  Lab 12/16/22 1623 12/16/22 2346 12/18/22 0834 12/19/22 0301 12/20/22 0439  NA 134*  --  136 135 136  K 4.5  --  3.1* 3.1* 4.4  CL 100  --  103 105 111  CO2 16*  --  21* 24 22  GLUCOSE 84  --  97 89 112*  BUN 9  --  <5* <5* 9  CREATININE 0.72 0.77 0.57 0.63 0.73  CALCIUM 9.3  --  8.8* 8.2* 8.0*  MG  --   --  1.4* 2.1  --     GFR: Estimated Creatinine Clearance: 98.1 mL/min (by C-G formula based on SCr of 0.73 mg/dL).  Liver Function Tests: Recent Labs  Lab 12/16/22 1623 12/18/22 0834  AST 33 9*  ALT 17 8  ALKPHOS 77 45  BILITOT 2.5* 1.0  PROT 8.2* 6.4*  ALBUMIN 3.6 2.6*    Recent Labs  Lab 12/16/22 1623  LIPASE 43    No results for input(s): "AMMONIA" in the last 168 hours. Coagulation Profile: No results for input(s): "INR", "PROTIME" in the last 168 hours. Cardiac Enzymes: No results for input(s): "CKTOTAL", "CKMB", "CKMBINDEX", "TROPONINI" in the last 168 hours. BNP (last 3 results) No results for input(s): "PROBNP" in the last 8760 hours. HbA1C: No results for input(s): "HGBA1C" in the last 72 hours. CBG: No results for input(s): "GLUCAP" in the last 168 hours. Lipid Profile: No results for input(s): "CHOL", "HDL", "LDLCALC", "TRIG", "CHOLHDL", "LDLDIRECT" in the last 72 hours. Thyroid Function Tests: No results for input(s): "TSH", "T4TOTAL", "FREET4", "T3FREE", "THYROIDAB" in the last 72 hours.  Anemia Panel: No results for input(s): "VITAMINB12", "FOLATE", "FERRITIN", "TIBC", "IRON", "RETICCTPCT" in the last 72 hours. Sepsis Labs: Recent Labs  Lab  12/16/22 2346 12/18/22 0834  PROCALCITON  --  2.77  LATICACIDVEN 0.8  --      Recent Results (from the past 240 hour(s))  Resp panel by RT-PCR (RSV, Flu A&B, Covid) Anterior Nasal Swab     Status: None   Collection Time: 12/16/22  4:04 PM   Specimen: Anterior Nasal Swab  Result Value Ref Range Status   SARS Coronavirus 2 by RT PCR NEGATIVE NEGATIVE Final    Comment: (NOTE) SARS-CoV-2 target nucleic acids are NOT DETECTED.  The SARS-CoV-2 RNA is generally detectable in upper respiratory specimens during the acute phase of infection. The lowest concentration of SARS-CoV-2 viral copies this assay can detect is 138 copies/mL. A negative result does not preclude SARS-Cov-2 infection and should not be used as the sole basis for treatment or other patient management decisions. A negative result may occur with  improper specimen collection/handling, submission of specimen other than nasopharyngeal swab, presence of viral mutation(s) within the areas targeted by this assay, and inadequate number of viral copies(<138 copies/mL). A negative result must be combined with clinical observations, patient history, and epidemiological information. The expected result is Negative.  Fact Sheet for Patients:  EntrepreneurPulse.com.au  Fact Sheet for Healthcare Providers:  IncredibleEmployment.be  This test is no t yet approved or cleared by the [place] FDA and  has been authorized for detection and/or diagnosis of SARS-CoV-2 by FDA under an Emergency Use Authorization (EUA). This EUA will remain  in effect (meaning this test can be used) for the duration of the COVID-19 declaration under Section 564(b)(1) of the Act, 21 U.S.C.section 360bbb-3(b)(1), unless the authorization is terminated  or revoked sooner.       Influenza A by PCR NEGATIVE NEGATIVE Final   Influenza B by PCR NEGATIVE NEGATIVE Final    Comment: (NOTE) The Xpert Xpress  SARS-CoV-2/FLU/RSV plus assay is intended as an aid in the diagnosis of influenza from Nasopharyngeal swab specimens and should not be used as a sole basis for treatment. Nasal washings and aspirates are unacceptable for Xpert Xpress SARS-CoV-2/FLU/RSV testing.  Fact Sheet for Patients: EntrepreneurPulse.com.au  Fact Sheet for Healthcare Providers: IncredibleEmployment.be  This test is not yet approved or cleared by the [place] FDA and has been authorized for detection and/or diagnosis of SARS-CoV-2 by FDA under an Emergency Use Authorization (EUA). This EUA will remain in effect (meaning this test can be used) for the duration of the COVID-19  declaration under Section 564(b)(1) of the Act, 21 U.S.C. section 360bbb-3(b)(1), unless the authorization is terminated or revoked.     Resp Syncytial Virus by PCR NEGATIVE NEGATIVE Final    Comment: (NOTE) Fact Sheet for Patients: EntrepreneurPulse.com.au  Fact Sheet for Healthcare Providers: IncredibleEmployment.be  This test is not yet approved or cleared by the [place] FDA and has been authorized for detection and/or diagnosis of SARS-CoV-2 by FDA under an Emergency Use Authorization (EUA). This EUA will remain in effect (meaning this test can be used) for the duration of the COVID-19 declaration under Section 564(b)(1) of the Act, 21 U.S.C. section 360bbb-3(b)(1), unless the authorization is terminated or revoked.  Performed at [facility], [place] [address] [place], [place] [address]   Culture, blood (Routine X 2) w Reflex to ID Panel     Status: None (Preliminary result)   Collection Time: 12/16/22 11:46 PM   Specimen: BLOOD RIGHT HAND  Result Value Ref Range Status   Specimen Description BLOOD RIGHT HAND  Final   Special Requests   Final    BOTTLES DRAWN AEROBIC AND ANAEROBIC Blood Culture adequate volume   Culture   Final    NO GROWTH  4 DAYS Performed at [facility], [place] [address], [place], [place] [address]    Report Status PENDING  Incomplete     Radiology Studies: No results found.  Scheduled Meds:  amLODipine  10 mg Oral Daily   cloNIDine  0.1 mg Oral TID   cyanocobalamin  1,000 mcg Oral Daily   enoxaparin (LOVENOX) injection  40 mg Subcutaneous QHS   feeding supplement  1 Container Oral TID BM   folic acid  1 mg Oral Daily   hydrALAZINE  100 mg Oral TID   lipase/protease/amylase  36,000 Units Oral TID WC   losartan  100 mg Oral Daily   multivitamin with minerals  1 tablet Oral Daily   nicotine  21 mg Transdermal Daily   pantoprazole  40 mg Oral BID AC   thiamine  100 mg Oral Daily   Continuous Infusions:  ceFEPime (MAXIPIME) IV 2 g (12/20/22 0854)     LOS: 4 days   Darliss Cheney, MD [facility]  12/20/2022, 12:01 PM   *Please note that this is a verbal dictation therefore any spelling or grammatical errors are due to the "[place] [facility]" system interpretation.  Please page via [name] and do not message via secure chat for urgent patient care matters. Secure chat can be used for non urgent patient care matters.  How to contact the [facility] Attending or Consulting provider [place] or covering provider during after hours [place], for this patient?  Check the care team in [facility] and look for a) attending/consulting [facility] provider listed and b) the [facility] team listed. Page or secure chat [phone]. Log into [email or web address] and use [place]'s universal password to access. If you do not have the password, please contact the hospital operator. Locate the [facility] provider you are looking for under [facility] and page to a number that you can be directly reached. If you still have difficulty reaching the provider, please page the [facility] (Director on Call) for the Hospitalists listed on amion for assistance.

## 2022-12-20 NOTE — Plan of Care (Signed)

## 2022-12-20 NOTE — TOC CM/SW Note (Addendum)
Spoke to patient regarding PCP .   Explained she can call the number on her insurance card and be provided a complete list of providers in network with her insurance.   Also, if patient knows of a provider she would like to establish care with she can call that office directly and see if the provider is accepting new patient's with her insurance.    NCM can call [facility] and see who is accepting new patient's with her insurance and schedule an appointment based on availability.   Patient requested NCM to recommend a provider . NCM explained NCM cannot recommend a provider. But patient can look up providers and reviews. Patient requested NCM to call [facility] and  make an appointment at a clinic close to her home and on a Thursday.   NCM asked what she considers close. Patient stated she is not doing to drive far.   NCM explained it will be in [place] . Patient said OK.    NCM scheduled appointment Thursday December 26, 2022 at 1:15 pm with Dr Linward Natal . Information placed on AVS. NCM instructed patient if she cannot make appointment to call and reschedule.

## 2022-12-21 DIAGNOSIS — K861 Other chronic pancreatitis: Secondary | ICD-10-CM | POA: Diagnosis not present

## 2022-12-21 DIAGNOSIS — K859 Acute pancreatitis without necrosis or infection, unspecified: Secondary | ICD-10-CM | POA: Diagnosis not present

## 2022-12-21 LAB — PROCALCITONIN: Procalcitonin: 0.31 ng/mL

## 2022-12-21 LAB — BASIC METABOLIC PANEL
Anion gap: 9 (ref 5–15)
BUN: <5 mg/dL — ABNORMAL LOW (ref 6–20)
CO2: 23 mmol/L (ref 22–32)
Calcium: 8.5 mg/dL — ABNORMAL LOW (ref 8.9–10.3)
Chloride: 105 mmol/L (ref 98–111)
Creatinine, Ser: 0.7 mg/dL (ref 0.44–1.00)
GFR, Estimated: >60 mL/min (ref >60)
Glucose, Bld: 98 mg/dL (ref 70–99)
Potassium: 3.6 mmol/L (ref 3.5–5.1)
Sodium: 137 mmol/L (ref 135–145)

## 2022-12-21 MED ORDER — LOSARTAN POTASSIUM 100 MG PO TABS: 100.0000 mg | ORAL_TABLET | Freq: Every day | ORAL | 0 refills | Status: DC

## 2022-12-21 MED ORDER — OXYCODONE HCL 5 MG PO TABS: 5.0000 mg | ORAL_TABLET | Freq: Three times a day (TID) | ORAL | 0 refills | Status: DC | PRN

## 2022-12-21 MED ORDER — CLONIDINE HCL 0.2 MG PO TABS
0.2000 mg | ORAL_TABLET | Freq: Three times a day (TID) | ORAL | Status: DC
  Administered 2022-12-21: 0.2 mg via ORAL
  Filled 2022-12-21: qty 1

## 2022-12-21 NOTE — Plan of Care (Signed)

## 2022-12-21 NOTE — Discharge Summary (Signed)
Physician Discharge Summary  Adel Neyer UUV:[id number] DOB: Apr 24, 1986 DOA: 12/16/2022  PCP: Patient, No Pcp Per  Admit date: 12/16/2022 Discharge date: 12/21/2022 30 Day Unplanned Readmission Risk Score    Flowsheet Row ED to Hosp-Admission (Current) from 12/16/2022 in [place] [facility]  SURGICAL  30 Day Unplanned Readmission Risk Score (%) 37.04 Filed at 12/21/2022 0800       This score is the patient's risk of an unplanned readmission within 30 days of being discharged (0 -100%). The score is based on dignosis, age, lab data, medications, orders, and past utilization.   Low:  0-14.9   Medium: 15-21.9   High: 22-29.9   Extreme: 30 and above          Admitted From: Home Disposition: Home  Recommendations for Outpatient Follow-up:  Follow up with PCP in 1-2 weeks Please obtain BMP/CBC in one week Follow-up with your primary GI in 1 to 2 weeks Please follow up with your PCP on the following pending results: Unresulted Labs (From admission, onward)     Start     Ordered   12/23/22 0500  Creatinine, serum  (enoxaparin (LOVENOX)    CrCl >/= 30 ml/min)  Weekly,   R     Comments: while on enoxaparin therapy   Question:  Specimen collection method  Answer:  IV Team=IV Team collect   12/16/22 [place]: None Equipment/Devices: None  Discharge Condition: Stable CODE STATUS: Full code Diet recommendation: Cardiac  Subjective: Seen and examined.  She states that her pain is much better, it is 1 out of 10.  She is tolerating diet without having any nausea or vomiting.  She is eager to go home today.  She will follow-up with her GI doctors.  Also follow-up with pain management.  Brief/Interim Summary: Robin Guerrero is a 37 y.o. female with medical history significant of alcoholic pancreatitis, chronic recurrent pancreatitis with pseudocyst status post stent placement last week at [facility], stable descending thoracic dissection,  essential hypertension who presented to the ER with nausea and abdominal pain rated as 10 out of 10.  Pain is centrally located radiating to the epigastric region.  Associated with nausea and not relieved by anything.  Pain is worse with food.  Patient was seen and evaluated in the ER.  Her workup showed normal lipase but CT abdomen pelvis showed acute on chronic pancreatitis with possible necrosis involving the body and tail of the pancreas.  Also questionable bilateral lower lobe infiltrates.  Patient was admitted under hospitalist service with acute on chronic pancreatitis and sepsis secondary to healthcare associated pneumonia.  Details below.    Acute on chronic pancreatitis: Has a history of chronic alcoholic pancreatitis.  She denies drinking alcohol for the very long time.  CT abdomen shows acute pancreatitis.  Lipase normal.  Patient has improved significantly, now tolerating regular diet and consuming 80% or above all the meals.  She feels comfortable going home.   Sepsis secondary to healthcare associated pneumonia: CT abdomen shows bibasilar infiltrates, could be just atelectasis however she has tachycardia and leukocytosis so she was started on cefepime and will complete 5 days of course today.  She did have elevated procalcitonin which has improved now.  No antibiotics at discharge.   History of aortic dissection: Appears to be stable.   Tobacco abuse: Nicotine patch.   Hypomagnesemia: Resolved.   Hypokalemia: Resolved   Hypertensive urgency:  Blood pressure significantly elevated upon arrival, resumed.  home amlodipine, clonidine, hydralazine, losartan , blood pressure now much better controlled.    Severe protein calorie malnutrition: Patient has been noted to consume more than 80% of all meals and she promised that she will work on her oral intake.  She can buy protein shakes over-the-counter.  She is understanding of that.  Discharge plan was discussed with patient and/or family  member and they verbalized understanding and agreed with it.  Discharge Diagnoses:  Principal Problem:   Acute on chronic pancreatitis [facility]) Active Problems:   Essential hypertension   GERD without esophagitis   Hypokalemia   Hypomagnesemia   Alcohol-induced chronic pancreatitis (HCC)   Alcohol use disorder, severe, in early remission ([place])   Sepsis due to pneumonia ([place])   Protein-calorie malnutrition, severe    Discharge Instructions   Allergies as of 12/21/2022       Reactions   Ativan [lorazepam] Other (See Comments)   Disorientated, combative   Tape Rash   Clear adhesive tape        Medication List     STOP taking these medications    oxyCODONE-acetaminophen 5-325 MG tablet Commonly known as: PERCOCET/ROXICET   propranolol 10 MG tablet Commonly known as: INDERAL       TAKE these medications    acetaminophen 500 MG tablet Commonly known as: TYLENOL Take 1,000 mg by mouth every 6 (six) hours as needed for moderate pain.   amLODipine 10 MG tablet Commonly known as: NORVASC Take 1 tablet (10 mg total) by mouth daily.   cloNIDine 0.2 MG tablet Commonly known as: CATAPRES Take 1 tablet (0.2 mg total) by mouth every 8 (eight) hours.   cyanocobalamin 1000 MCG tablet Take 1 tablet (1,000 mcg total) by mouth daily.   hydrALAZINE 100 MG tablet Commonly known as: APRESOLINE TAKE 1 TABLET BY MOUTH 3 TIMES DAILY.   losartan 100 MG tablet Commonly known as: COZAAR Take 1 tablet (100 mg total) by mouth daily.   metoCLOPramide 10 MG tablet Commonly known as: REGLAN Take 1 tablet (10 mg total) by mouth every 6 (six) hours.   multivitamin with minerals tablet Take 1 tablet by mouth daily.   ondansetron 4 MG tablet Commonly known as: Zofran Take 1 tablet (4 mg total) by mouth every 8 (eight) hours as needed for nausea or vomiting.   oxyCODONE 5 MG immediate release tablet Commonly known as: Roxicodone Take 1 tablet (5 mg total) by mouth every 8 (eight)  hours as needed.   pantoprazole 40 MG tablet Commonly known as: PROTONIX Take 40 mg by mouth 2 (two) times daily before a meal.   promethazine 12.5 MG tablet Commonly known as: PHENERGAN Take 1 tablet (12.5 mg total) by mouth every 8 (eight) hours as needed for nausea or vomiting.   Zenpep 40000-126000 units Cpep Generic drug: Pancrelipase (Lip-Prot-Amyl) Take 1-2 capsules by mouth See admin instructions. 2 tablets with each meal and 1 tablet with snacks        Follow-up Information     [facility]. Call.   Why: Call this number to find a [facility] Provider who accepts your insurance. Contact information: [phone]        Linward Natal, MD Follow up.   Why: Thursday December 26, 2022 at 1:15 pm Contact information: [address] [place] [address] [phone]                Allergies  Allergen Reactions   Ativan [Lorazepam] Other (  See Comments)    Disorientated, combative   Tape Rash    Clear adhesive tape    Consultations: None   Procedures/Studies: DG Chest Portable 1 View  Result Date: 12/16/2022 CLINICAL DATA:  Pancreatic stent placed on 12/10/2022 due to chronic pancreatitis, nausea and vomiting, initial encounter EXAM: PORTABLE CHEST 1 VIEW COMPARISON:  08/11/2022 FINDINGS: Cardiac shadow is within normal limits. Tortuous thoracic aorta is noted. Lungs are well aerated bilaterally. No bony abnormality is noted. IMPRESSION: No active disease. Electronically Signed   By: Inez Catalina M.D.   On: 12/16/2022 21:33   CT ABDOMEN PELVIS W CONTRAST  Result Date: 12/16/2022 CLINICAL DATA:  Epigastric pain post ERCP with stent for chronic pancreatitis December 16 EXAM: CT ABDOMEN AND PELVIS WITH CONTRAST TECHNIQUE: Multidetector CT imaging of the abdomen and pelvis was performed using the standard protocol following bolus administration of intravenous contrast. RADIATION DOSE REDUCTION: This exam was performed according to the departmental dose-optimization  program which includes automated exposure control, adjustment of the mA and/or kV according to patient size and/or use of iterative reconstruction technique. CONTRAST:  85m OMNIPAQUE IOHEXOL 350 MG/ML SOLN COMPARISON:  CT December 07, 2022. FINDINGS: Lower chest: Patchy ground-glass opacities in the bilateral lung bases favored infectious or inflammatory. Hepatobiliary: No suspicious hepatic lesion. Gallbladder is distended without focal wall thickening. No biliary ductal dilation. Pancreas: Stent in the main pancreatic duct without pancreatic ductal dilation. Pancreas appears more edematous with increased peripancreatic stranding. Foci of hypoenhancement in the pancreatic body/tail for instance on image 25/3. Small peripancreatic fluid collection measuring 2 cm between the tail of the pancreas and splenic hilum. Spleen: Normal size spleen. Adrenals/Urinary Tract: Bilateral adrenal glands appear normal. No hydronephrosis. Kidneys demonstrate symmetric enhancement. Urinary bladder is unremarkable for degree of distension. Stomach/Bowel: Marked wall edema of the gastric cardia and fundus. No pathologic dilation of small or large bowel. Moderate volume of formed stool in the colon. 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 Similar extrinsic narrowing of the portal vein and splenic vein near the confluence. SMV appears patent. Numerous compensatory venous collaterals similar prior. No pathologically enlarged abdominal or pelvic lymph nodes. Reproductive: Leiomyomatous uterus. Other: No pneumoperitoneum. Musculoskeletal: No acute osseous abnormality. IMPRESSION: 1. Acute on chronic pancreatitis with areas of hypoenhancement of the pancreatic body/tail suggestive of necrosis. Stent of the pancreatic duct without pancreatic ductal dilation.  2. New small peripancreatic fluid collection measuring 2 cm between the tail of the pancreas and splenic hilum. 3. Marked wall edema of the gastric cardia and fundus, likely reactive. 4. 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 5. Patchy ground-glass opacities in the bilateral lung bases favored infectious or inflammatory. Electronically Signed   By: JDahlia BailiffM.D.   On: 12/16/2022 20:38   CT Angio Chest/Abd/Pel for Dissection W and/or Wo Contrast  Result Date: 12/07/2022 CLINICAL DATA:  Severe tearing pain from epigastric to the back. History of type B dissection. EXAM: CT ANGIOGRAPHY CHEST, ABDOMEN AND PELVIS TECHNIQUE: Non-contrast CT of the chest was initially obtained. Multidetector CT imaging through the chest, abdomen and pelvis was performed using the standard protocol during bolus administration of intravenous contrast. Multiplanar reconstructed images and MIPs were obtained and reviewed to evaluate the vascular  anatomy. RADIATION DOSE REDUCTION: This exam was performed according to the departmental dose-optimization program which includes automated exposure control, adjustment of the mA and/or kV according to patient size and/or use of iterative reconstruction technique. CONTRAST:  88m OMNIPAQUE IOHEXOL 350 MG/ML SOLN COMPARISON:  CT examination dated October 31, 2022 FINDINGS: CTA CHEST FINDINGS Cardiovascular: Type B thoracic aortic dissection extending from the inferior aspect of the subclavian artery to the right common iliac artery is unchanged. 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 Mediastinum/Nodes: No enlarged mediastinal, hilar, or axillary lymph nodes. Thyroid gland, trachea, and esophagus demonstrate no significant findings. Lungs/Pleura: Lungs are clear. No pleural effusion or  pneumothorax. Musculoskeletal: No chest wall abnormality. No acute or significant osseous findings. Review of the MIP images confirms the above findings. CTA ABDOMEN AND PELVIS FINDINGS VASCULAR Aorta: Aortic dissection extending throughout the abdominal aorta and terminating in the right common iliac artery, unchanged. At the diaphragmatic hiatus abdominal aorta measures up to 3 cm, unchanged. Celiac: Celiac artery arises from the true lumen of the aorta without significant stenosis. SMA: Arises from the true lumen of the aortic dissection and is normal in caliber. No significant stenosis. Renals: Left renal artery arises from the true lumen and the right from the false lumen. Symmetric perfusion of bilateral kidneys. Duplication of the right renal arteries. IMA: Patent without evidence of aneurysm, dissection, vasculitis or significant stenosis. Arises from the true lumen. Inflow: Patent without evidence of aneurysm, dissection, vasculitis or significant stenosis. Veins: No obvious venous abnormality within the limitations of this arterial phase study. Review of the MIP images confirms the above findings. NON-VASCULAR Hepatobiliary: No focal liver abnormality is seen. No gallstones, gallbladder wall thickening, or biliary dilatation. Pancreas: Cystic structure in the pancreatic head measuring a proximally 1.1 cm. No pancreatic ductal dilatation or surrounding inflammatory changes. Spleen: Normal in size without focal abnormality. Adrenals/Urinary Tract: Adrenal glands are unremarkable. Kidneys are normal, without renal calculi, focal lesion, or hydronephrosis. Bladder is unremarkable. Stomach/Bowel: Stomach is within normal limits. Appendix appears normal. No evidence of bowel wall thickening, distention, or inflammatory changes. Lymphatic: No lymphadenopathy. Reproductive: Uterus and bilateral adnexa are unremarkable. Other: No abdominal wall hernia or abnormality. No abdominopelvic ascites. Musculoskeletal: No  acute or significant osseous findings. Review of the MIP images confirms the above findings. IMPRESSION: 1. Type B thoracic aortic dissection extending from the inferior aspect of the left subclavian artery to the right common iliac artery, unchanged. 2. 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 3. Abdominal aorta measures up to 3 cm at the diaphragmatic hiatus, unchanged. 4. Cystic structure in the pancreatic head measuring up to 1.1 cm, unchanged. Follow-up with MRI/MRCP in 6 months is recommended. 5. No evidence of pulmonary embolism. 6. Lungs are clear. 7. No CT evidence of acute abdominal/pelvic process. Electronically Signed   By: IKeane PoliceD.O.   On: 12/07/2022 23:31     Discharge Exam: Vitals:   12/21/22 0600 12/21/22 0733  BP: 129/85 (!) 144/97  Pulse: (!) 101 97  Resp: 18 16  Temp: 98.5 F (36.9 C) 98.6 F (37 C)  SpO2: 98% 99%   Vitals:   12/20/22 1649 12/20/22 2046 12/21/22 0600 12/21/22 0733  BP: (!) 142/101 (!) 140/99 129/85 (!) 144/97  Pulse: (!) 101 (!) 106 (!) 101 97  Resp: '18 18 18 16  '$ Temp: (!) 97.4 F (36.3 C) 98.3 F (36.8 C) 98.5 F (36.9 C) 98.6 F (37 C)  TempSrc: Oral Oral Oral Oral  SpO2: 100% 97% 98% 99%  Weight:      Height:        General: Pt is alert, awake, not in acute distress Cardiovascular: RRR, S1/S2 +, no rubs, no gallops Respiratory: CTA bilaterally, no wheezing, no rhonchi Abdominal: Soft, very minimal epigastric tenderness, ND, bowel sounds + Extremities: no edema, no cyanosis    The results of significant diagnostics from this hospitalization (including imaging, microbiology, ancillary and laboratory) are listed below for reference.     Microbiology: Recent Results (from the past 240 hour(s))  Resp panel by RT-PCR (RSV, Flu A&B, Covid) Anterior Nasal Swab     Status: None   Collection Time: 12/16/22  4:04 PM   Specimen: Anterior Nasal Swab  Result Value Ref Range Status   SARS Coronavirus 2  by RT PCR NEGATIVE NEGATIVE Final    Comment: (NOTE) SARS-CoV-2 target nucleic acids are NOT DETECTED.  The SARS-CoV-2 RNA is generally detectable in upper respiratory specimens during the acute phase of infection. The lowest concentration of SARS-CoV-2 viral copies this assay can detect is 138 copies/mL. A negative result does not preclude SARS-Cov-2 infection and should not be used as the sole basis for treatment or other patient management decisions. A negative result may occur with  improper specimen collection/handling, submission of specimen other than nasopharyngeal swab, presence of viral mutation(s) within the areas targeted by this assay, and inadequate number of viral copies(<138 copies/mL). A negative result must be combined with clinical observations, patient history, and epidemiological information. The expected result is Negative.  Fact Sheet for Patients:  EntrepreneurPulse.com.au  Fact Sheet for Healthcare Providers:  IncredibleEmployment.be  This test is no t yet approved or cleared by the [place] FDA and  has been authorized for detection and/or diagnosis of SARS-CoV-2 by FDA under an Emergency Use Authorization (EUA). This EUA will remain  in effect (meaning this test can be used) for the duration of the COVID-19 declaration under Section 564(b)(1) of the Act, 21 U.S.C.section 360bbb-3(b)(1), unless the authorization is terminated  or revoked sooner.       Influenza A by PCR NEGATIVE NEGATIVE Final   Influenza B by PCR NEGATIVE NEGATIVE Final    Comment: (NOTE) The Xpert Xpress SARS-CoV-2/FLU/RSV plus assay is intended as an aid in the diagnosis of influenza from Nasopharyngeal swab specimens and should not be used as a sole basis for treatment. Nasal washings and aspirates are unacceptable for Xpert Xpress SARS-CoV-2/FLU/RSV testing.  Fact Sheet for Patients: EntrepreneurPulse.com.au  Fact  Sheet for Healthcare Providers: IncredibleEmployment.be  This test is not yet approved or cleared by the [place] FDA and has been authorized for detection and/or diagnosis of SARS-CoV-2 by FDA under an Emergency Use Authorization (EUA). This EUA will remain in effect (meaning this test can be used) for the duration of the COVID-19 declaration under Section 564(b)(1) of the Act, 21 U.S.C. section 360bbb-3(b)(1), unless the authorization is terminated or revoked.     Resp Syncytial Virus by PCR NEGATIVE NEGATIVE Final    Comment: (NOTE) Fact Sheet for Patients: EntrepreneurPulse.com.au  Fact Sheet for Healthcare Providers: IncredibleEmployment.be  This test is not yet approved or cleared by the [place] FDA and has been authorized for detection and/or diagnosis of SARS-CoV-2 by FDA under an Emergency Use Authorization (EUA). This EUA will remain in effect (meaning this test can be used) for the duration of the COVID-19 declaration under Section 564(b)(1) of the Act, 21 U.S.C. section 360bbb-3(b)(1), unless the  authorization is terminated or revoked.  Performed at [facility], [place] [address], [place], [place] [address]   Culture, blood (Routine X 2) w Reflex to ID Panel     Status: None (Preliminary result)   Collection Time: 12/16/22 11:46 PM   Specimen: BLOOD RIGHT HAND  Result Value Ref Range Status   Specimen Description BLOOD RIGHT HAND  Final   Special Requests   Final    BOTTLES DRAWN AEROBIC AND ANAEROBIC Blood Culture adequate volume   Culture   Final    NO GROWTH 4 DAYS Performed at [facility], [place] [address] [place], [place] [address]    Report Status PENDING  Incomplete     Labs: BNP (last 3 results) Recent Labs    10/31/22 2330  BNP 4.6   Basic Metabolic Panel: Recent Labs  Lab 12/16/22 1623 12/16/22 2346 12/18/22 0834 12/19/22 0301 12/20/22 0439 12/21/22 0311  NA  134*  --  136 135 136 137  K 4.5  --  3.1* 3.1* 4.4 3.6  CL 100  --  103 105 111 105  CO2 16*  --  21* '24 22 23  '$ GLUCOSE 84  --  97 89 112* 98  BUN 9  --  <5* <5* 9 <5*  CREATININE 0.72 0.77 0.57 0.63 0.73 0.70  CALCIUM 9.3  --  8.8* 8.2* 8.0* 8.5*  MG  --   --  1.4* 2.1  --   --    Liver Function Tests: Recent Labs  Lab 12/16/22 1623 12/18/22 0834  AST 33 9*  ALT 17 8  ALKPHOS 77 45  BILITOT 2.5* 1.0  PROT 8.2* 6.4*  ALBUMIN 3.6 2.6*   Recent Labs  Lab 12/16/22 1623  LIPASE 43   No results for input(s): "AMMONIA" in the last 168 hours. CBC: Recent Labs  Lab 12/16/22 1623 12/16/22 2346 12/18/22 0834 12/19/22 0301  WBC 14.2* 12.1* 11.8* 7.5  NEUTROABS 12.3*  --  10.1* 6.0  HGB 12.8 11.3* 9.6* 8.1*  HCT 38.9 35.2* 28.3* 25.3*  MCV 93.5 94.9 92.5 94.1  PLT 376 303 192 180   Cardiac Enzymes: No results for input(s): "CKTOTAL", "CKMB", "CKMBINDEX", "TROPONINI" in the last 168 hours. BNP: Invalid input(s): "POCBNP" CBG: No results for input(s): "GLUCAP" in the last 168 hours. D-Dimer No results for input(s): "DDIMER" in the last 72 hours. Hgb A1c No results for input(s): "HGBA1C" in the last 72 hours. Lipid Profile No results for input(s): "CHOL", "HDL", "LDLCALC", "TRIG", "CHOLHDL", "LDLDIRECT" in the last 72 hours. Thyroid function studies No results for input(s): "TSH", "T4TOTAL", "T3FREE", "THYROIDAB" in the last 72 hours.  Invalid input(s): "FREET3" Anemia work up No results for input(s): "VITAMINB12", "FOLATE", "FERRITIN", "TIBC", "IRON", "RETICCTPCT" in the last 72 hours. Urinalysis    Component Value Date/Time   COLORURINE YELLOW 12/16/2022 2131   APPEARANCEUR CLEAR 12/16/2022 2131   LABSPEC >1.046 (H) 12/16/2022 2131   PHURINE 6.0 12/16/2022 2131   GLUCOSEU NEGATIVE 12/16/2022 2131   HGBUR NEGATIVE 12/16/2022 2131   BILIRUBINUR NEGATIVE 12/16/2022 2131   KETONESUR 80 (A) 12/16/2022 2131   PROTEINUR 30 (A) 12/16/2022 2131   UROBILINOGEN 1.0  04/13/2014 1138   NITRITE NEGATIVE 12/16/2022 2131   LEUKOCYTESUR NEGATIVE 12/16/2022 2131   Sepsis Labs Recent Labs  Lab 12/16/22 1623 12/16/22 2346 12/18/22 0834 12/19/22 0301  WBC 14.2* 12.1* 11.8* 7.5   Microbiology Recent Results (from the past 240 hour(s))  Resp panel by RT-PCR (RSV, Flu A&B, Covid)  Anterior Nasal Swab     Status: None   Collection Time: 12/16/22  4:04 PM   Specimen: Anterior Nasal Swab  Result Value Ref Range Status   SARS Coronavirus 2 by RT PCR NEGATIVE NEGATIVE Final    Comment: (NOTE) SARS-CoV-2 target nucleic acids are NOT DETECTED.  The SARS-CoV-2 RNA is generally detectable in upper respiratory specimens during the acute phase of infection. The lowest concentration of SARS-CoV-2 viral copies this assay can detect is 138 copies/mL. A negative result does not preclude SARS-Cov-2 infection and should not be used as the sole basis for treatment or other patient management decisions. A negative result may occur with  improper specimen collection/handling, submission of specimen other than nasopharyngeal swab, presence of viral mutation(s) within the areas targeted by this assay, and inadequate number of viral copies(<138 copies/mL). A negative result must be combined with clinical observations, patient history, and epidemiological information. The expected result is Negative.  Fact Sheet for Patients:  EntrepreneurPulse.com.au  Fact Sheet for Healthcare Providers:  IncredibleEmployment.be  This test is no t yet approved or cleared by the [place] FDA and  has been authorized for detection and/or diagnosis of SARS-CoV-2 by FDA under an Emergency Use Authorization (EUA). This EUA will remain  in effect (meaning this test can be used) for the duration of the COVID-19 declaration under Section 564(b)(1) of the Act, 21 U.S.C.section 360bbb-3(b)(1), unless the authorization is terminated  or revoked sooner.        Influenza A by PCR NEGATIVE NEGATIVE Final   Influenza B by PCR NEGATIVE NEGATIVE Final    Comment: (NOTE) The Xpert Xpress SARS-CoV-2/FLU/RSV plus assay is intended as an aid in the diagnosis of influenza from Nasopharyngeal swab specimens and should not be used as a sole basis for treatment. Nasal washings and aspirates are unacceptable for Xpert Xpress SARS-CoV-2/FLU/RSV testing.  Fact Sheet for Patients: EntrepreneurPulse.com.au  Fact Sheet for Healthcare Providers: IncredibleEmployment.be  This test is not yet approved or cleared by the [place] FDA and has been authorized for detection and/or diagnosis of SARS-CoV-2 by FDA under an Emergency Use Authorization (EUA). This EUA will remain in effect (meaning this test can be used) for the duration of the COVID-19 declaration under Section 564(b)(1) of the Act, 21 U.S.C. section 360bbb-3(b)(1), unless the authorization is terminated or revoked.     Resp Syncytial Virus by PCR NEGATIVE NEGATIVE Final    Comment: (NOTE) Fact Sheet for Patients: EntrepreneurPulse.com.au  Fact Sheet for Healthcare Providers: IncredibleEmployment.be  This test is not yet approved or cleared by the [place] FDA and has been authorized for detection and/or diagnosis of SARS-CoV-2 by FDA under an Emergency Use Authorization (EUA). This EUA will remain in effect (meaning this test can be used) for the duration of the COVID-19 declaration under Section 564(b)(1) of the Act, 21 U.S.C. section 360bbb-3(b)(1), unless the authorization is terminated or revoked.  Performed at [facility], [place] [address] [place], [place] [address]   Culture, blood (Routine X 2) w Reflex to ID Panel     Status: None (Preliminary result)   Collection Time: 12/16/22 11:46 PM   Specimen: BLOOD RIGHT HAND  Result Value Ref Range Status   Specimen Description BLOOD RIGHT HAND   Final   Special Requests   Final    BOTTLES DRAWN AEROBIC AND ANAEROBIC Blood Culture adequate volume   Culture   Final    NO GROWTH 4 DAYS Performed at [facility], [place]  [address], [place], [place] [address]    Report Status PENDING  Incomplete     Time coordinating discharge: Over 30 minutes  SIGNED:   Darliss Cheney, MD  [facility] 12/21/2022, 9:48 AM *Please note that this is a verbal dictation therefore any spelling or grammatical errors are due to the "[place]" system interpretation. If 7PM-7AM, please contact night-coverage [email or web address]

## 2022-12-22 LAB — CULTURE, BLOOD (ROUTINE X 2)
Culture: NO GROWTH
Special Requests: ADEQUATE

## 2022-12-26 ENCOUNTER — Ambulatory Visit (INDEPENDENT_AMBULATORY_CARE_PROVIDER_SITE_OTHER): Payer: 59

## 2022-12-26 VITALS — BP 148/99 | HR 100 | Temp 98.2°F | Ht 69.0 in | Wt 172.6 lb

## 2022-12-26 DIAGNOSIS — K852 Alcohol induced acute pancreatitis without necrosis or infection: Secondary | ICD-10-CM

## 2022-12-26 DIAGNOSIS — D509 Iron deficiency anemia, unspecified: Secondary | ICD-10-CM

## 2022-12-26 DIAGNOSIS — Z720 Tobacco use: Secondary | ICD-10-CM

## 2022-12-26 DIAGNOSIS — Z8679 Personal history of other diseases of the circulatory system: Secondary | ICD-10-CM

## 2022-12-26 DIAGNOSIS — I10 Essential (primary) hypertension: Secondary | ICD-10-CM | POA: Diagnosis not present

## 2022-12-26 DIAGNOSIS — F1011 Alcohol abuse, in remission: Secondary | ICD-10-CM

## 2022-12-26 DIAGNOSIS — I1A0 Resistant hypertension: Secondary | ICD-10-CM | POA: Diagnosis not present

## 2022-12-26 DIAGNOSIS — F17210 Nicotine dependence, cigarettes, uncomplicated: Secondary | ICD-10-CM

## 2022-12-26 MED ORDER — PANCRELIPASE (LIP-PROT-AMYL) 36000-114000 UNITS PO CPEP: ORAL_CAPSULE | ORAL | 11 refills | Status: DC

## 2022-12-26 MED ORDER — CARVEDILOL 12.5 MG PO TABS: 12.5000 mg | ORAL_TABLET | Freq: Two times a day (BID) | ORAL | 11 refills | Status: DC

## 2022-12-26 MED ORDER — ONDANSETRON HCL 4 MG PO TABS: 4.0000 mg | ORAL_TABLET | Freq: Three times a day (TID) | ORAL | 0 refills | Status: DC | PRN

## 2022-12-26 MED ORDER — VARENICLINE TARTRATE 0.5 MG PO TABS: 0.5000 mg | ORAL_TABLET | Freq: Two times a day (BID) | ORAL | 2 refills | Status: DC

## 2022-12-26 MED ORDER — PROMETHAZINE HCL 12.5 MG PO TABS: 12.5000 mg | ORAL_TABLET | Freq: Three times a day (TID) | ORAL | 2 refills | Status: DC | PRN

## 2022-12-26 NOTE — Progress Notes (Signed)
CC: establish care, HFU  HPI:  Ms.Robin Guerrero is a 37 y.o. with past medical history as below who presents for HFU and to establish care. See detailed assessment and plan for HPI.  Lives in mobile home with two children. Works for [employer]. Smokes 6 cigarettes daily.  Occasional alcoholic beverage until 0093. Mother died and patient drank heavily. Social drinking since 2021. Completely abstinent since may 2023.  Occasional THC vaping but not for several months.   Not sexually active currently.   Past Medical History:  Diagnosis Date   Descending thoracic dissection ([place])    History of anemia    no current med.   Hypertension    states under control with med., has been on med. x 3 yr.   Pancreatitis    Review of Systems:  See detailed assessment and plan for pertinent ROS.  Physical Exam:  Vitals:   12/26/22 1331  BP: (!) 148/99  Pulse: 100  Temp: 98.2 F (36.8 C)  TempSrc: Oral  SpO2: 100%  Weight: 172 lb 9.6 oz (78.3 kg)  Height: '5\' 9"'$  (1.753 m)   Physical Exam Constitutional:      General: She is not in acute distress. HENT:     Head: Normocephalic and atraumatic.  Eyes:     General: No scleral icterus. Cardiovascular:     Rate and Rhythm: Normal rate and regular rhythm.  Pulmonary:     Effort: Pulmonary effort is normal.     Breath sounds: No wheezing or rales.  Abdominal:     General: There is no distension.     Palpations: There is no shifting dullness, hepatomegaly or splenomegaly.     Tenderness: There is generalized abdominal tenderness.  Skin:    General: Skin is warm and dry.     Coloration: Skin is not jaundiced.  Neurological:     General: No focal deficit present.     Mental Status: She is alert and oriented to person, place, and time.  Psychiatric:        Mood and Affect: Mood normal.        Behavior: Behavior normal.      Assessment & Plan:   See Encounters Tab for problem based charting.  Resistant  hypertension BP Readings from Last 3 Encounters:  12/26/22 (!) 148/99  12/21/22 (!) 144/97  12/08/22 (!) 110/93  Patient presents with history of resistant hypertension of unknown etiology.  History of descending aortic dissection.  Do not see history of renal ultrasound in chart.  Patient is currently taking amlodipine 10 mg daily, clonidine 0.2 mg 3 times daily, losartan 100 mg daily, and hydralazine 100 mg 3 times daily.  Currently denies headache, chest pain, vision changes. -Add carvedilol 12.5 mg twice daily   H/O aortic dissection Stable on recent CT imaging.  Denies any chest pain today. -Restart carvedilol 12.5 mg twice daily -Follow-up with cardiology  Alcoholic pancreatitis Patient has history of pancreatitis since 2021 with recent hospitalization for acute flare.  Discharge 1/6.  Continues with occasional nausea and vomiting when trying to eat.  She states she was previously on Creon but this was discontinued due to inconsistent meals and she was placed on Zenpep.  She states this gives her headaches and she is not taking.  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  She is on Protonix for this.  Gastric varices resolved on recent EGD.  Patient has not  had alcohol since May. -CMP -Restart Creon -Continue Phenergan for nausea -Follow-up with [facility] in 3 months for ERCP and possible pancreatic stent replacement  Tobacco use Patient has cut back to 6 cigarettes a day. -Prescription for Chantix  Iron deficiency anemia Unsure of the nature of her iron deficiency anemia.  No concerns for overt bleeding.  Recent EGD without visualization of varices.  Denies hemoptysis. -CBC -Iron panel -Ferritin  History of alcohol abuse Patient has abstained from alcohol since May.  She denies any cravings.  She does not feel as though she needs any medical therapy to help with her abstinence.  We discussed the  importance of continued abstinence in the setting of her pancreatitis. -Continue to revisit this in case patient needs support in the future  Patient discussed with Dr. Philipp Ovens

## 2022-12-26 NOTE — Assessment & Plan Note (Addendum)
BP Readings from Last 3 Encounters:  12/26/22 (!) 148/99  12/21/22 (!) 144/97  12/08/22 (!) 110/93  Patient presents with history of resistant hypertension of unknown etiology.  History of descending aortic dissection.  Do not see history of renal ultrasound in chart.  Patient is currently taking amlodipine 10 mg daily, clonidine 0.2 mg 3 times daily, losartan 100 mg daily, and hydralazine 100 mg 3 times daily.  Currently denies headache, chest pain, vision changes. -Add carvedilol 12.5 mg twice daily

## 2022-12-26 NOTE — Assessment & Plan Note (Addendum)
Unsure of the nature of her iron deficiency anemia.  No concerns for overt bleeding.  Recent EGD without visualization of varices.  Denies hemoptysis. -CBC -Iron panel -Ferritin

## 2022-12-26 NOTE — Assessment & Plan Note (Deleted)
BP Readings from Last 3 Encounters:  12/26/22 (!) 148/99  12/21/22 (!) 144/97  12/08/22 (!) 110/93  Patient presents with history of resistant hypertension of unknown etiology.  History of descending aortic dissection.  Do not see history of renal ultrasound in chart.  Patient is currently taking amlodipine 10 mg daily, clonidine 0.2 mg 3 times daily, losartan 100 mg daily, and hydralazine 100 mg 3 times daily. -Add carvedilol 12.5 mg twice daily

## 2022-12-26 NOTE — Assessment & Plan Note (Signed)
Patient has abstained from alcohol since May.  She denies any cravings.  She does not feel as though she needs any medical therapy to help with her abstinence.  We discussed the importance of continued abstinence in the setting of her pancreatitis. -Continue to revisit this in case patient needs support in the future

## 2022-12-26 NOTE — Patient Instructions (Addendum)
Ms.Kiora Birky, it was a pleasure seeing you today!  Today we discussed: Pancreatitis - Take creon with meals. F/u with gastroenterology. Blood pressure - take coreg 12.5 mg twice daily. F/u with cardiology. Sleep - try melatonin extended release or Sleep3 Diet - google mediterranean diet   I have ordered the following labs today:  Lab Orders         CMP14 + Anion Gap         CBC no Diff         Iron and IBC ([id number])         Ferritin       Tests ordered today:  none  Referrals ordered today:   Referral Orders  No referral(s) requested today     I have ordered the following medication/changed the following medications:   Stop the following medications: Medications Discontinued During This Encounter  Medication Reason   Pancrelipase, Lip-Prot-Amyl, (ZENPEP) 40000-126000 units CPEP    ondansetron (ZOFRAN) 4 MG tablet Reorder   promethazine (PHENERGAN) 12.5 MG tablet Reorder     Start the following medications: Meds ordered this encounter  Medications   carvedilol (COREG) 12.5 MG tablet    Sig: Take 1 tablet (12.5 mg total) by mouth 2 (two) times daily.    Dispense:  60 tablet    Refill:  11   varenicline (CHANTIX) 0.5 MG tablet    Sig: Take 1 tablet (0.5 mg total) by mouth 2 (two) times daily.    Dispense:  60 tablet    Refill:  2   lipase/protease/amylase (CREON) 36000 UNITS CPEP capsule    Sig: Take 2 capsules (72,000 Units total) by mouth 3 (three) times daily with meals. May also take 1 capsule (36,000 Units total) as needed (with snacks).    Dispense:  240 capsule    Refill:  11   ondansetron (ZOFRAN) 4 MG tablet    Sig: Take 1 tablet (4 mg total) by mouth every 8 (eight) hours as needed for nausea or vomiting.    Dispense:  30 tablet    Refill:  0   promethazine (PHENERGAN) 12.5 MG tablet    Sig: Take 1 tablet (12.5 mg total) by mouth every 8 (eight) hours as needed for nausea or vomiting.    Dispense:  30 tablet    Refill:  2      Follow-up: 3 months   Please make sure to arrive 15 minutes prior to your next appointment. If you arrive late, you may be asked to reschedule.   We look forward to seeing you next time. Please call our clinic at [phone] if you have any questions or concerns. The best time to call is Monday-Friday from 9am-4pm, but there is someone available 24/7. If after hours or the weekend, call the main hospital number and ask for the Internal Medicine Resident On-Call. If you need medication refills, please notify your pharmacy one week in advance and they will send [place] a request.  Thank you for letting us take part in your care. Wishing you the best!  Thank you, Linward Natal, MD

## 2022-12-26 NOTE — Assessment & Plan Note (Addendum)
Patient has history of pancreatitis since 2021 with recent hospitalization for acute flare.  Discharge 1/6.  Continues with occasional nausea and vomiting when trying to eat.  She states she was previously on Creon but this was discontinued due to inconsistent meals and she was placed on Zenpep.  She states this gives her headaches and she is not taking.  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  She is on Protonix for this.  Gastric varices resolved on recent EGD.  Patient has not had alcohol since May. -CMP -Restart Creon -Continue Phenergan for nausea -Follow-up with [facility] in 3 months for ERCP and possible pancreatic stent replacement

## 2022-12-26 NOTE — Assessment & Plan Note (Signed)
" >>  ASSESSMENT AND PLAN FOR HISTORY OF AORTIC DISSECTION WRITTEN ON 12/26/2022  3:45 PM BY Taraneh Metheney, MD  Stable on recent CT imaging.  Denies any chest pain today. -Restart carvedilol  12.5 mg twice daily -Follow-up with cardiology "

## 2022-12-26 NOTE — Assessment & Plan Note (Signed)
Stable on recent CT imaging.  Denies any chest pain today. -Restart carvedilol 12.5 mg twice daily -Follow-up with cardiology

## 2022-12-26 NOTE — Assessment & Plan Note (Signed)
Patient has cut back to 6 cigarettes a day. -Prescription for Chantix

## 2022-12-27 LAB — CBC
Hematocrit: 30.2 % — ABNORMAL LOW (ref 34.0–46.6)
Hemoglobin: 9.5 g/dL — ABNORMAL LOW (ref 11.1–15.9)
MCH: 30.4 pg (ref 26.6–33.0)
MCHC: 31.5 g/dL (ref 31.5–35.7)
MCV: 97 fL (ref 79–97)
Platelets: 321 10*3/uL (ref 150–450)
RBC: 3.13 x10E6/uL — ABNORMAL LOW (ref 3.77–5.28)
RDW: 13.3 % (ref 11.7–15.4)
WBC: 6.4 10*3/uL (ref 3.4–10.8)

## 2022-12-27 LAB — CMP14 + ANION GAP
ALT: 9 IU/L (ref 0–32)
AST: 10 IU/L (ref 0–40)
Albumin/Globulin Ratio: 1.3 (ref 1.2–2.2)
Albumin: 3.8 g/dL — ABNORMAL LOW (ref 3.9–4.9)
Alkaline Phosphatase: 57 IU/L (ref 44–121)
Anion Gap: 12 mmol/L (ref 10.0–18.0)
BUN/Creatinine Ratio: 20 (ref 9–23)
BUN: 12 mg/dL (ref 6–20)
Bilirubin Total: 0.5 mg/dL (ref 0.0–1.2)
CO2: 25 mmol/L (ref 20–29)
Calcium: 9 mg/dL (ref 8.7–10.2)
Chloride: 104 mmol/L (ref 96–106)
Creatinine, Ser: 0.61 mg/dL (ref 0.57–1.00)
Globulin, Total: 3 g/dL (ref 1.5–4.5)
Glucose: 83 mg/dL (ref 70–99)
Potassium: 4 mmol/L (ref 3.5–5.2)
Sodium: 141 mmol/L (ref 134–144)
Total Protein: 6.8 g/dL (ref 6.0–8.5)
eGFR: 119 mL/min/{1.73_m2} (ref >59)

## 2022-12-27 LAB — FERRITIN: Ferritin: 93 ng/mL (ref 15–150)

## 2022-12-27 LAB — IRON AND TIBC
Iron Saturation: 16 % (ref 15–55)
Iron: 44 ug/dL (ref 27–159)
Total Iron Binding Capacity: 275 ug/dL (ref 250–450)
UIBC: 231 ug/dL (ref 131–425)

## 2022-12-27 NOTE — Progress Notes (Signed)
Internal Medicine Clinic Attending  Case discussed with Dr. White  At the time of the visit.  We reviewed the resident's history and exam and pertinent patient test results.  I agree with the assessment, diagnosis, and plan of care documented in the resident's note.  

## 2023-01-23 ENCOUNTER — Other Ambulatory Visit: Payer: Self-pay

## 2023-01-23 DIAGNOSIS — I10 Essential (primary) hypertension: Secondary | ICD-10-CM

## 2023-01-28 NOTE — Telephone Encounter (Signed)
Please address rx

## 2023-02-03 ENCOUNTER — Other Ambulatory Visit: Payer: Self-pay

## 2023-02-03 DIAGNOSIS — I10 Essential (primary) hypertension: Secondary | ICD-10-CM

## 2023-02-03 MED ORDER — CLONIDINE HCL 0.2 MG PO TABS: 0.2000 mg | ORAL_TABLET | Freq: Three times a day (TID) | ORAL | 1 refills | Status: DC

## 2023-02-03 MED ORDER — LOSARTAN POTASSIUM 100 MG PO TABS: 100.0000 mg | ORAL_TABLET | Freq: Every day | ORAL | 0 refills | Status: DC

## 2023-02-03 NOTE — Telephone Encounter (Signed)
Pt states the pharmacy is waiting for the doctor to reply back on cloNIDine (CATAPRES) 0.2 MG tablet and losartan (COZAAR) 100 MG tablet (Expired). Requesting this meds to be filled by today. Please call pt back.

## 2023-02-10 ENCOUNTER — Encounter: Payer: Self-pay | Admitting: Obstetrics and Gynecology

## 2023-02-10 ENCOUNTER — Ambulatory Visit (INDEPENDENT_AMBULATORY_CARE_PROVIDER_SITE_OTHER): Payer: 59 | Admitting: Obstetrics and Gynecology

## 2023-02-10 ENCOUNTER — Other Ambulatory Visit (HOSPITAL_COMMUNITY)
Admission: AD | Admit: 2023-02-10 | Discharge: 2023-02-10 | Disposition: A | Discharge status: Home / Self Care | Payer: 59 | Source: Ambulatory Visit | Attending: Obstetrics and Gynecology | Admitting: Obstetrics and Gynecology

## 2023-02-10 ENCOUNTER — Other Ambulatory Visit: Payer: Self-pay

## 2023-02-10 VITALS — BP 142/92 | HR 97 | Wt 182.0 lb

## 2023-02-10 DIAGNOSIS — Z01419 Encounter for gynecological examination (general) (routine) without abnormal findings: Secondary | ICD-10-CM | POA: Diagnosis present

## 2023-02-10 NOTE — Telephone Encounter (Signed)
Patient has been assigned PCP, Dr. Linward Natal.  Forwarding back to sender and triage.

## 2023-02-10 NOTE — Progress Notes (Signed)
Pt states she had been having irregular cycles that was thought due to medication.  Pt states she is no longer on that medication and still having irregularities.

## 2023-02-10 NOTE — Progress Notes (Signed)
Subjective:     Robin Guerrero is a 37 y.o. female P2 with LMP 01/31/23 and BMI 26 who is here for a comprehensive physical exam. The patient reports no problems. She reports a monthly period lasting 4-5 days. This past period was heavier in flow than usual due to skipping a period in January. This was the first time she skipped a cycle. She is sexually active without contraception and does not desire to conceive. She denies pelvic pain or abnormal discharge. She denies urinary incontinence. She is without any complaints  Past Medical History:  Diagnosis Date   Descending thoracic dissection ([facility])    History of anemia    no current med.   Hypertension    states under control with med., has been on med. x 3 yr.   Pancreatitis    Past Surgical History:  Procedure Laterality Date   BIOPSY  09/11/2022   Procedure: BIOPSY;  Surgeon: Rush Landmark Telford Nab., MD;  Location: [facility];  Service: Gastroenterology;;   ESOPHAGOGASTRODUODENOSCOPY N/A 06/06/2022   Procedure: ESOPHAGOGASTRODUODENOSCOPY (EGD);  Surgeon: Arta Silence, MD;  Location: [facility];  Service: Gastroenterology;  Laterality: N/A;   ESOPHAGOGASTRODUODENOSCOPY N/A 06/14/2022   Procedure: ESOPHAGOGASTRODUODENOSCOPY (EGD);  Surgeon: [name], MD;  Location: [facility];  Service: Gastroenterology;  Laterality: N/A;   ESOPHAGOGASTRODUODENOSCOPY (EGD) WITH PROPOFOL N/A 08/01/2022   Procedure: ESOPHAGOGASTRODUODENOSCOPY (EGD) WITH PROPOFOL;  Surgeon: Clarene Essex, MD;  Location: [facility];  Service: Gastroenterology;  Laterality: N/A;   ESOPHAGOGASTRODUODENOSCOPY (EGD) WITH PROPOFOL N/A 09/11/2022   Procedure: ESOPHAGOGASTRODUODENOSCOPY (EGD) WITH PROPOFOL;  Surgeon: Rush Landmark Telford Nab., MD;  Location: [facility];  Service: Gastroenterology;  Laterality: N/A;   EUS N/A 09/11/2022   Procedure: UPPER ENDOSCOPIC ULTRASOUND (EUS) RADIAL;  Surgeon: Irving Copas., MD;  Location: [facility];  Service:  Gastroenterology;  Laterality: N/A;   FOREIGN BODY REMOVAL Left 02/10/2015   Procedure: ATTEMPTED EXPLANTATION OF BIRTH CONTROL DEVICE LEFT ARM;  Surgeon: Johnathan Hausen, MD;  Location: [facility];  Service: General;  Laterality: Left;   FOREIGN BODY REMOVAL Left 05/12/2015   Procedure: REMOVAL OF LEFT ARM BIRTH CONTROL DEVICE;  Surgeon: Johnathan Hausen, MD;  Location: [facility];  Service: General;  Laterality: Left;   NEUROLYTIC CELIAC PLEXUS N/A 09/11/2022   Procedure: NEUROLYTIC CELIAC PLEXUS;  Surgeon: Irving Copas., MD;  Location: [facility];  Service: Gastroenterology;  Laterality: N/A;   SUBMANDIBULAR GLAND EXCISION Right 05/01/2009   Family History  Problem Relation Age of Onset   Breast cancer Mother 49   Heart disease Mother    Hypertension Mother    Colon cancer Neg Hx    Esophageal cancer Neg Hx    Inflammatory bowel disease Neg Hx    Liver disease Neg Hx    Pancreatic cancer Neg Hx    Rectal cancer Neg Hx    Stomach cancer Neg Hx      Social History   Socioeconomic History   Marital status: Single    Spouse name: Not on file   Number of children: 2   Years of education: Not on file   Highest education level: Not on file  Occupational History   Not on file  Tobacco Use   Smoking status: Every Day    Packs/day: 0.12    Years: 5.00    Total pack years: 0.60    Types: Cigarettes   Smokeless tobacco: Never   Tobacco comments:    1 pack/week  Vaping Use   Vaping Use: Never  used  Substance and Sexual Activity   Alcohol use: Not Currently    Comment: occasionally   Drug use: No   Sexual activity: Not Currently  Other Topics Concern   Not on file  Social History Narrative   Not on file   Social Determinants of Health   Financial Resource Strain: Not on file  Food Insecurity: Food Insecurity Present (12/17/2022)   Hunger Vital Sign    Worried About Running Out of Food in the Last Year: Sometimes true    Ran Out of  Food in the Last Year: Sometimes true  Transportation Needs: No Transportation Needs (12/17/2022)   PRAPARE - Hydrologist (Medical): No    Lack of Transportation (Non-Medical): No  Physical Activity: Not on file  Stress: Not on file  Social Connections: Not on file  Intimate Partner Violence: Not At Risk (12/17/2022)   Humiliation, Afraid, Rape, and Kick questionnaire    Fear of Current or Ex-Partner: No    Emotionally Abused: No    Physically Abused: No    Sexually Abused: No   Health Maintenance  Topic Date Due   COVID-19 Vaccine (1) Never done   Hepatitis C Screening  Never done   DTaP/Tdap/Td (1 - Tdap) Never done   INFLUENZA VACCINE  Never done   PAP SMEAR-Modifier  10/31/2023   HIV Screening  Completed   HPV VACCINES  Aged Out       Review of Systems Pertinent items noted in HPI and remainder of comprehensive ROS otherwise negative.   Objective:    GENERAL: Well-developed, well-nourished female in no acute distress.  HEENT: Normocephalic, atraumatic. Sclerae anicteric.  NECK: Supple. Normal thyroid.  LUNGS: Clear to auscultation bilaterally.  HEART: Regular rate and rhythm. BREASTS: Symmetric in size. No palpable masses or lymphadenopathy, skin changes, or nipple drainage. ABDOMEN: Soft, nontender, nondistended. No organomegaly. PELVIC: Normal external female genitalia. Vagina is pink and rugated.  Normal discharge. Normal appearing cervix. Uterus is normal in size. No adnexal mass or tenderness. Chaperone present during the pelvic exam EXTREMITIES: No cyanosis, clubbing, or edema, 2+ distal pulses.     Assessment:    Healthy female exam.      Plan:    Pap smear collected STI screening per patient request Patient will be contacted with abnormal results Patient declined contraception. Discussed use of condoms for pregnancy prevention at a minimum given descending aortic disection See After Visit Summary for Counseling Recommendations

## 2023-02-12 LAB — CERVICOVAGINAL ANCILLARY ONLY
Chlamydia: NEGATIVE
Comment: NEGATIVE
Comment: NORMAL
Neisseria Gonorrhea: NEGATIVE

## 2023-02-13 ENCOUNTER — Ambulatory Visit
Admission: EM | Admit: 2023-02-13 | Discharge: 2023-02-13 | Disposition: A | Discharge status: Home / Self Care | Payer: 59 | Attending: Internal Medicine | Admitting: Internal Medicine

## 2023-02-13 DIAGNOSIS — F17210 Nicotine dependence, cigarettes, uncomplicated: Secondary | ICD-10-CM | POA: Insufficient documentation

## 2023-02-13 DIAGNOSIS — J45909 Unspecified asthma, uncomplicated: Secondary | ICD-10-CM | POA: Insufficient documentation

## 2023-02-13 DIAGNOSIS — R058 Other specified cough: Secondary | ICD-10-CM | POA: Diagnosis not present

## 2023-02-13 DIAGNOSIS — I10 Essential (primary) hypertension: Secondary | ICD-10-CM | POA: Insufficient documentation

## 2023-02-13 DIAGNOSIS — Z1152 Encounter for screening for COVID-19: Secondary | ICD-10-CM | POA: Diagnosis not present

## 2023-02-13 DIAGNOSIS — K859 Acute pancreatitis without necrosis or infection, unspecified: Secondary | ICD-10-CM | POA: Insufficient documentation

## 2023-02-13 DIAGNOSIS — Z7951 Long term (current) use of inhaled steroids: Secondary | ICD-10-CM | POA: Diagnosis not present

## 2023-02-13 DIAGNOSIS — J029 Acute pharyngitis, unspecified: Secondary | ICD-10-CM | POA: Diagnosis present

## 2023-02-13 DIAGNOSIS — J069 Acute upper respiratory infection, unspecified: Secondary | ICD-10-CM | POA: Diagnosis present

## 2023-02-13 DIAGNOSIS — Z79899 Other long term (current) drug therapy: Secondary | ICD-10-CM | POA: Diagnosis not present

## 2023-02-13 DIAGNOSIS — R6889 Other general symptoms and signs: Secondary | ICD-10-CM | POA: Insufficient documentation

## 2023-02-13 LAB — POCT RAPID STREP A (OFFICE): Rapid Strep A Screen: NEGATIVE

## 2023-02-13 LAB — CYTOLOGY - PAP: Diagnosis: NEGATIVE

## 2023-02-13 LAB — POCT INFLUENZA A/B
Influenza A, POC: NEGATIVE
Influenza B, POC: NEGATIVE

## 2023-02-13 MED ORDER — BENZONATATE 100 MG PO CAPS: 100.0000 mg | ORAL_CAPSULE | Freq: Three times a day (TID) | ORAL | 0 refills | Status: DC | PRN

## 2023-02-13 MED ORDER — OSELTAMIVIR PHOSPHATE 75 MG PO CAPS: 75.0000 mg | ORAL_CAPSULE | Freq: Two times a day (BID) | ORAL | 0 refills | Status: DC

## 2023-02-13 MED ORDER — FLUTICASONE PROPIONATE 50 MCG/ACT NA SUSP: 1.0000 | Freq: Every day | NASAL | 0 refills | Status: DC

## 2023-02-13 NOTE — ED Provider Notes (Signed)
[facility]    CSN: [id number] Arrival date & time: 02/13/23  0901      History   Chief Complaint Chief Complaint  Patient presents with   Cough    HPI Robin Guerrero is a 37 y.o. female.   Patient presents with cough, sneezing, nasal congestion, fatigue, generalized bodyaches, chills, sore throat that started 2 days ago.  Patient is not sure if she has had a fever at home.  Reports her children recently tested positive for flu and strep.  Patient has taken over-the-counter cold and flu medications as well as drinking tea with minimal improvement in symptoms.  Reports history of Asthma but has not had to use albuterol inhaler.  Denies chest pain or shortness of breath.   Cough   Past Medical History:  Diagnosis Date   Descending thoracic dissection (HCC)    History of anemia    no current med.   Hypertension    states under control with med., has been on med. x 3 yr.   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   Alcohol use disorder, severe, in early remission ([place]) 07/07/2022   Hepatic steatosis 07/07/2022   Chronic generalized abdominal pain 07/07/2022   Abnormal endoscopy of upper gastrointestinal tract 07/07/2022   Portal hypertension ([place]) 07/07/2022   Portal vein thrombosis 07/07/2022   Iron deficiency anemia 06/11/2022   Acute blood loss anemia 06/04/2022   GERD without esophagitis 06/04/2022   History of alcohol abuse 06/04/2022   Pseudocyst, pancreas 06/04/2022   Acute pancreatitis 04/22/2022   Aortic dissection (HCC) 02/20/2022   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    ARF (acute renal failure) ([place]) 06/04/2021   Acute on chronic pancreatitis ([place]) 06/04/2021   H/O aortic dissection 01/31/2021   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[place]) 11/09/2020   Malignant hypertension    Nausea and vomiting    Back pain    Hypocalcemia 123456   Acute alcoholic pancreatitis 123456   Thrombocytopenia ([place]) 08/19/2020   Normocytic anemia 123456   Alcoholic pancreatitis [id number]   SIRS (systemic inflammatory response syndrome) ([place]) 08/18/2020   Hypokalemia 08/18/2020   Hypomagnesemia 08/18/2020   Essential hypertension 08/18/2020   Depression 08/18/2020   Tobacco use 08/18/2020    Past Surgical History:  Procedure Laterality Date   BIOPSY  09/11/2022   Procedure: BIOPSY;  Surgeon: Irving Copas., MD;  Location: [facility];  Service: Gastroenterology;;   ESOPHAGOGASTRODUODENOSCOPY N/A 06/06/2022   Procedure: ESOPHAGOGASTRODUODENOSCOPY (EGD);  Surgeon: Arta Silence, MD;  Location: [facility];  Service: Gastroenterology;  Laterality: N/A;   ESOPHAGOGASTRODUODENOSCOPY N/A 06/14/2022   Procedure: ESOPHAGOGASTRODUODENOSCOPY (EGD);  Surgeon: [name], MD;  Location: [facility];  Service: Gastroenterology;  Laterality: N/A;   ESOPHAGOGASTRODUODENOSCOPY (EGD) WITH PROPOFOL N/A 08/01/2022   Procedure: ESOPHAGOGASTRODUODENOSCOPY (EGD) WITH PROPOFOL;  Surgeon: Clarene Essex, MD;  Location: [facility];  Service: Gastroenterology;  Laterality: N/A;   ESOPHAGOGASTRODUODENOSCOPY (EGD) WITH PROPOFOL N/A 09/11/2022   Procedure: ESOPHAGOGASTRODUODENOSCOPY (EGD) WITH PROPOFOL;  Surgeon: Rush Landmark Telford Nab., MD;  Location: [facility];  Service: Gastroenterology;  Laterality: N/A;   EUS N/A 09/11/2022   Procedure: UPPER ENDOSCOPIC ULTRASOUND (EUS) RADIAL;  Surgeon: Irving Copas.,  MD;  Location: [facility];  Service: Gastroenterology;  Laterality: N/A;   FOREIGN BODY REMOVAL Left 02/10/2015   Procedure: ATTEMPTED EXPLANTATION OF  BIRTH CONTROL DEVICE LEFT ARM;  Surgeon: Johnathan Hausen, MD;  Location: [facility];  Service: General;  Laterality: Left;   FOREIGN BODY REMOVAL Left 05/12/2015   Procedure: REMOVAL OF LEFT ARM BIRTH CONTROL DEVICE;  Surgeon: Johnathan Hausen, MD;  Location: [facility];  Service: General;  Laterality: Left;   NEUROLYTIC CELIAC PLEXUS N/A 09/11/2022   Procedure: NEUROLYTIC CELIAC PLEXUS;  Surgeon: Irving Copas., MD;  Location: [facility];  Service: Gastroenterology;  Laterality: N/A;   SUBMANDIBULAR GLAND EXCISION Right 05/01/2009    OB History     Gravida  4   Para  2   Term  2   Preterm      AB  2   Living  2      SAB      IAB  2   Ectopic      Multiple      Live Births  2            Home Medications    Prior to Admission medications   Medication Sig Start Date End Date Taking? Authorizing Provider  benzonatate (TESSALON) 100 MG capsule Take 1 capsule (100 mg total) by mouth every 8 (eight) hours as needed for cough. 02/13/23  Yes , Hildred Alamin E, FNP  fluticasone (FLONASE) 50 MCG/ACT nasal spray Place 1 spray into both nostrils daily. 02/13/23  Yes , Michele Rockers, FNP  oseltamivir (TAMIFLU) 75 MG capsule Take 1 capsule (75 mg total) by mouth every 12 (twelve) hours. 02/13/23  Yes , Hildred Alamin E, FNP  acetaminophen (TYLENOL) 500 MG tablet Take 1,000 mg by mouth every 6 (six) hours as needed for moderate pain.    [provider]  amLODipine (NORVASC) 10 MG tablet Take 1 tablet (10 mg total) by mouth daily. 03/08/22   Patwardhan, Reynold Bowen, MD  carvedilol (COREG) 12.5 MG tablet Take 1 tablet (12.5 mg total) by mouth 2 (two) times daily. 12/26/22 12/26/23  Linward Natal, MD  cloNIDine (CATAPRES) 0.2 MG tablet Take 1 tablet (0.2 mg total) by mouth every 8 (eight) hours. 02/03/23 04/04/23  Sanjuan Dame, MD  hydrALAZINE (APRESOLINE) 100 MG tablet TAKE 1 TABLET BY MOUTH 3 TIMES DAILY. 08/20/22   Patwardhan, Reynold Bowen, MD   lipase/protease/amylase (CREON) 36000 UNITS CPEP capsule Take 2 capsules (72,000 Units total) by mouth 3 (three) times daily with meals. May also take 1 capsule (36,000 Units total) as needed (with snacks). 12/26/22   Linward Natal, MD  losartan (COZAAR) 100 MG tablet Take 1 tablet (100 mg total) by mouth daily. 02/03/23 03/05/23  Sanjuan Dame, MD  metoCLOPramide (REGLAN) 10 MG tablet Take 1 tablet (10 mg total) by mouth every 6 (six) hours. Patient not taking: Reported on 02/10/2023 10/28/22   Fransico Meadow, MD  Multiple Vitamins-Minerals (MULTIVITAMIN WITH MINERALS) tablet Take 1 tablet by mouth daily.    [provider]  ondansetron (ZOFRAN) 4 MG tablet Take 1 tablet (4 mg total) by mouth every 8 (eight) hours as needed for nausea or vomiting. 12/26/22   Linward Natal, MD  pantoprazole (PROTONIX) 40 MG tablet Take 40 mg by mouth 2 (two) times daily before a meal.    [provider]  promethazine (PHENERGAN) 12.5 MG tablet Take 1 tablet (12.5 mg total) by mouth every 8 (eight) hours as needed for nausea or vomiting. 12/26/22  Linward Natal, MD  varenicline (CHANTIX) 0.5 MG tablet Take 1 tablet (0.5 mg total) by mouth 2 (two) times daily. 12/26/22   Linward Natal, MD  vitamin B-12 1000 MCG tablet Take 1 tablet (1,000 mcg total) by mouth daily. 06/15/22   Regalado, Cassie Freer, MD    Family History Family History  Problem Relation Age of Onset   Breast cancer Mother 34   Heart disease Mother    Hypertension Mother    Colon cancer Neg Hx    Esophageal cancer Neg Hx    Inflammatory bowel disease Neg Hx    Liver disease Neg Hx    Pancreatic cancer Neg Hx    Rectal cancer Neg Hx    Stomach cancer Neg Hx     Social History Social History   Tobacco Use   Smoking status: Every Day    Packs/day: 0.12    Years: 5.00    Total pack years: 0.60    Types: Cigarettes   Smokeless tobacco: Never   Tobacco comments:    1 pack/week  Vaping Use   Vaping Use: Never used   Substance Use Topics   Alcohol use: Not Currently    Comment: occasionally   Drug use: No     Allergies   Ativan [lorazepam] and Tape   Review of Systems Review of Systems Per HPI  Physical Exam Triage Vital Signs ED Triage Vitals  Enc Vitals Group     BP 02/13/23 0959 (!) 133/93     Pulse Rate 02/13/23 0959 76     Resp 02/13/23 0959 18     Temp 02/13/23 0959 98.3 F (36.8 C)     Temp Source 02/13/23 0959 Oral     SpO2 02/13/23 0959 95 %     Weight --      Height --      Head Circumference --      Peak Flow --      Pain Score 02/13/23 1000 6     Pain Loc --      Pain Edu? --      Excl. in [place]? --    No data found.  Updated Vital Signs BP (!) 133/93 (BP Location: Left Arm)   Pulse 76   Temp 98.3 F (36.8 C) (Oral)   Resp 18   LMP 01/31/2023   SpO2 95%   Visual Acuity Right Eye Distance:   Left Eye Distance:   Bilateral Distance:    Right Eye Near:   Left Eye Near:    Bilateral Near:     Physical Exam Constitutional:      General: She is not in acute distress.    Appearance: Normal appearance. She is not toxic-appearing or diaphoretic.  HENT:     Head: Normocephalic and atraumatic.     Right Ear: Tympanic membrane and ear canal normal.     Left Ear: Tympanic membrane and ear canal normal.     Nose: Congestion present.     Mouth/Throat:     Mouth: Mucous membranes are moist.     Pharynx: Posterior oropharyngeal erythema present.  Eyes:     Extraocular Movements: Extraocular movements intact.     Conjunctiva/sclera: Conjunctivae normal.     Pupils: Pupils are equal, round, and reactive to light.  Cardiovascular:     Rate and Rhythm: Normal rate and regular rhythm.     Pulses: Normal pulses.     Heart sounds: Normal heart sounds.  Pulmonary:     Effort:  Pulmonary effort is normal. No respiratory distress.     Breath sounds: Normal breath sounds. No stridor. No wheezing, rhonchi or rales.  Abdominal:     General: Abdomen is flat. Bowel sounds  are normal.     Palpations: Abdomen is soft.  Musculoskeletal:        General: Normal range of motion.     Cervical back: Normal range of motion.  Skin:    General: Skin is warm and dry.  Neurological:     General: No focal deficit present.     Mental Status: She is alert and oriented to person, place, and time. Mental status is at baseline.  Psychiatric:        Mood and Affect: Mood normal.        Behavior: Behavior normal.      UC Treatments / Results  Labs (all labs ordered are listed, but only abnormal results are displayed) Labs Reviewed  CULTURE, GROUP A STREP ([place])  SARS CORONAVIRUS 2 (TAT 6-24 HRS)  POCT INFLUENZA A/B  POCT RAPID STREP A (OFFICE)    EKG   Radiology No results found.  Procedures Procedures (including critical care time)  Medications Ordered in UC Medications - No data to display  Initial Impression / Assessment and Plan / UC Course  I have reviewed the triage vital signs and the nursing notes.  Pertinent labs & imaging results that were available during my care of the patient were reviewed by me and considered in my medical decision making (see chart for details).     Patient presents with symptoms likely from a viral upper respiratory infection. Do not suspect underlying cardiopulmonary process. Symptoms seem unlikely related to ACS, CHF or COPD exacerbations, pneumonia, pneumothorax. Patient is nontoxic appearing and not in need of emergent medical intervention.  Rapid flu and rapid strep are negative.  Throat culture and COVID test pending.  Given patient's physical exam and associated symptoms, I am still highly suspicious of influenza especially given close exposure.  Therefore, will opt to treat with Tamiflu given patient is right inside the treatment window.  Do not have flu PCR capabilities here in urgent care at this time.  Recommended symptom control with medications and supportive care.  Return if symptoms fail to improve in 1-2  weeks or you develop shortness of breath, chest pain, severe headache. Patient states understanding and is agreeable.  Discharged with PCP followup.  Final Clinical Impressions(s) / UC Diagnoses   Final diagnoses:  Viral upper respiratory tract infection with cough  Flu-like symptoms  Sore throat     Discharge Instructions      Rapid strep and rapid flu were negative. I have prescribed tamiflu given I am still suspicious of the flu given your close exposure. Covid test and throat culture are pending. I have also prescribed you a few medications to help alleviate your symptoms. Please follow up if any symptoms persist or worsen.      ED Prescriptions     Medication Sig Dispense Auth. Provider   fluticasone (FLONASE) 50 MCG/ACT nasal spray Place 1 spray into both nostrils daily. 16 g , Hildred Alamin E, [place]   benzonatate (TESSALON) 100 MG capsule Take 1 capsule (100 mg total) by mouth every 8 (eight) hours as needed for cough. 21 capsule [place], [place] E, [place]   oseltamivir (TAMIFLU) 75 MG capsule Take 1 capsule (75 mg total) by mouth every 12 (twelve) hours. 10 capsule Teodora Medici, [place]      PDMP not  reviewed this encounter.   Teodora Medici, [place] 02/13/23 1048

## 2023-02-13 NOTE — ED Triage Notes (Signed)
Patient with c/o cough and congestion and generally not feeling well. Patient states she has body aches and has felt chilled, but unsure if she has had a fever. States her children just got over the flu and strep.

## 2023-02-13 NOTE — Discharge Instructions (Addendum)
Rapid strep and rapid flu were negative. I have prescribed tamiflu given I am still suspicious of the flu given your close exposure. Covid test and throat culture are pending. I have also prescribed you a few medications to help alleviate your symptoms. Please follow up if any symptoms persist or worsen.

## 2023-02-14 LAB — CULTURE, GROUP A STREP (THRC)

## 2023-02-14 LAB — SARS CORONAVIRUS 2 (TAT 6-24 HRS): SARS Coronavirus 2: NEGATIVE

## 2023-02-15 LAB — RPR: RPR Ser Ql: NONREACTIVE

## 2023-02-15 LAB — HEPATITIS C ANTIBODY: Hep C Virus Ab: NONREACTIVE

## 2023-02-15 LAB — HIV ANTIBODY (ROUTINE TESTING W REFLEX): HIV Screen 4th Generation wRfx: NONREACTIVE

## 2023-02-15 LAB — HEPATITIS B SURFACE ANTIGEN: Hepatitis B Surface Ag: NEGATIVE

## 2023-02-17 ENCOUNTER — Telehealth (HOSPITAL_COMMUNITY): Payer: Self-pay | Admitting: Emergency Medicine

## 2023-02-17 MED ORDER — AZITHROMYCIN 250 MG PO TABS: 250.0000 mg | ORAL_TABLET | Freq: Every day | ORAL | 0 refills | Status: DC

## 2023-02-26 ENCOUNTER — Other Ambulatory Visit: Payer: Self-pay | Admitting: Student

## 2023-03-07 ENCOUNTER — Ambulatory Visit
Admission: RE | Admit: 2023-03-07 | Discharge: 2023-03-07 | Disposition: A | Discharge status: Home / Self Care | Payer: 59 | Source: Ambulatory Visit | Attending: Physician Assistant | Admitting: Physician Assistant

## 2023-03-07 VITALS — BP 119/80 | HR 97 | Temp 98.1°F | Resp 16

## 2023-03-07 DIAGNOSIS — T7840XA Allergy, unspecified, initial encounter: Secondary | ICD-10-CM

## 2023-03-07 DIAGNOSIS — J040 Acute laryngitis: Secondary | ICD-10-CM

## 2023-03-07 MED ORDER — FLUTICASONE PROPIONATE 50 MCG/ACT NA SUSP: 2.0000 | Freq: Every day | NASAL | 0 refills | Status: DC

## 2023-03-07 MED ORDER — LORATADINE 10 MG PO TABS: 10.0000 mg | ORAL_TABLET | Freq: Every day | ORAL | 1 refills | Status: DC

## 2023-03-07 MED ORDER — PREDNISONE 10 MG PO TABS: 10.0000 mg | ORAL_TABLET | Freq: Three times a day (TID) | ORAL | 0 refills | Status: DC

## 2023-03-07 NOTE — Discharge Instructions (Signed)
Advised use of Flonase nasal spray, 2 sprays each nostril once daily to help decrease allergies and drainage. Advised take Claritin 10 mg once daily to help decrease allergies and drainage. Advised take prednisone 10 mg every 8 hours for 4 days only as this will help decrease allergies and also help to resolve the laryngitis.  Advised to observe voice rest over the next several days as the laryngitis resolves.  Advised follow-up PCP return to urgent care as needed.

## 2023-03-07 NOTE — ED Provider Notes (Signed)
[facility]    CSN: [id number] Arrival date & time: 03/07/23  1507      History   Chief Complaint Chief Complaint  Patient presents with   Hoarse    HPI Robin Guerrero is a 37 y.o. female.   37 year old female presents with laryngitis.  Patient indicates that she has a history of having allergies.  She indicates over the past several days she has had upper respiratory congestion with watery itchy eyes, rhinitis which is mainly been clear, itchy ears, and sinus congestion.  She also indicates that she has been having over the past 24 hours increasing laryngitis to where her voice is crackling, and it is difficult for her to speak and audible level so that her daughter can hear.  She is without fever, chills, no wheezing, or shortness of breath.  She is currently not taking any medication OTC to help control her symptoms.  She does have an occupation where she has to talk all day for her work shift, this tends to aggravate her laryngitis.     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   Hypertension    states under control with med., has been on med. x 3 yr.   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   Alcohol use disorder, severe, in early remission ([place]) 07/07/2022   Hepatic steatosis 07/07/2022   Chronic generalized abdominal pain 07/07/2022   Abnormal endoscopy of upper gastrointestinal tract 07/07/2022   Portal hypertension ([place]) 07/07/2022   Portal vein thrombosis 07/07/2022   Iron deficiency anemia  06/11/2022   Acute blood loss anemia 06/04/2022   GERD without esophagitis 06/04/2022   History of alcohol abuse 06/04/2022   Pseudocyst, pancreas 06/04/2022   Acute pancreatitis 04/22/2022   Aortic dissection (H[facility]) 02/20/2022   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   ARF (acute renal failure) ([place]) 06/04/2021   Acute on chronic pancreatitis ([place]) 06/04/2021   H/O aortic dissection 01/31/2021   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[place]) 11/09/2020   Malignant hypertension    Nausea and vomiting    Back pain    Hypocalcemia 123456   Acute alcoholic pancreatitis 123456   Thrombocytopenia ([place]) 08/19/2020   Normocytic anemia 123456   Alcoholic pancreatitis XX123456   SIRS (systemic inflammatory response syndrome) ([place]) 08/18/2020   Hypokalemia 08/18/2020   Hypomagnesemia 08/18/2020   Essential hypertension 08/18/2020   Depression 08/18/2020   Tobacco use 08/18/2020    Past Surgical History:  Procedure Laterality Date   BIOPSY  09/11/2022   Procedure: BIOPSY;  Surgeon: Irving Copas., MD;  Location: [facility];  Service: Gastroenterology;;   ESOPHAGOGASTRODUODENOSCOPY N/A 06/06/2022   Procedure: ESOPHAGOGASTRODUODENOSCOPY (EGD);  Surgeon: Arta Silence, MD;  Location: [facility];  Service: Gastroenterology;  Laterality: N/A;   ESOPHAGOGASTRODUODENOSCOPY N/A 06/14/2022   Procedure: ESOPHAGOGASTRODUODENOSCOPY (EGD);  Surgeon: [name], MD;  Location: [facility];  Service: Gastroenterology;  Laterality: N/A;   ESOPHAGOGASTRODUODENOSCOPY (EGD) WITH PROPOFOL N/A 08/01/2022   Procedure: ESOPHAGOGASTRODUODENOSCOPY (EGD) WITH PROPOFOL;  Surgeon: Clarene Essex, MD;  [facility]: [facility];  Service: Gastroenterology;  Laterality:  N/A;   ESOPHAGOGASTRODUODENOSCOPY (EGD) WITH PROPOFOL N/A 09/11/2022   Procedure: ESOPHAGOGASTRODUODENOSCOPY (EGD) WITH PROPOFOL;  Surgeon: Rush Landmark Telford Nab., MD;  Location: [facility];  Service: Gastroenterology;  Laterality:  N/A;   EUS N/A 09/11/2022   Procedure: UPPER ENDOSCOPIC ULTRASOUND (EUS) RADIAL;  Surgeon: Irving Copas., MD;  [facility]: [facility];  Service: Gastroenterology;  Laterality: N/A;   FOREIGN BODY REMOVAL Left 02/10/2015   Procedure: ATTEMPTED EXPLANTATION OF BIRTH CONTROL DEVICE LEFT ARM;  Surgeon: Johnathan Hausen, MD;  Location: [facility];  Service: General;  Laterality: Left;   FOREIGN BODY REMOVAL Left 05/12/2015   Procedure: REMOVAL OF LEFT ARM BIRTH CONTROL DEVICE;  Surgeon: Johnathan Hausen, MD;  Location: [facility];  Service: General;  Laterality: Left;   NEUROLYTIC CELIAC PLEXUS N/A 09/11/2022   Procedure: NEUROLYTIC CELIAC PLEXUS;  Surgeon: Irving Copas., MD;  Location: [facility];  Service: Gastroenterology;  Laterality: N/A;   SUBMANDIBULAR GLAND EXCISION Right 05/01/2009    OB History     Gravida  4   Para  2   Term  2   Preterm      AB  2   Living  2      SAB      IAB  2   Ectopic      Multiple      Live Births  2            Home Medications    Prior to Admission medications   Medication Sig Start Date End Date Taking? Authorizing Provider  fluticasone (FLONASE) 50 MCG/ACT nasal spray Place 2 sprays into both nostrils daily. 03/07/23  Yes Nyoka Lint, PA-C  loratadine (CLARITIN) 10 MG tablet Take 1 tablet (10 mg total) by mouth daily. 03/07/23  Yes Nyoka Lint, PA-C  predniSONE (DELTASONE) 10 MG tablet Take 1 tablet (10 mg total) by mouth in the morning, at noon, and at bedtime. 03/07/23  Yes Nyoka Lint, PA-C  acetaminophen (TYLENOL) 500 MG tablet Take 1,000 mg by mouth every 6 (six) hours as needed for moderate pain.    [provider]  amLODipine (NORVASC) 10 MG tablet Take 1 tablet (10 mg total) by mouth daily. 03/08/22   Patwardhan, Reynold Bowen, MD  azithromycin (ZITHROMAX) 250 MG tablet Take 1 tablet (250 mg total) by mouth daily. Take first 2 tablets together, then 1 every day until finished.  02/17/23   Lamptey, Myrene Galas, MD  benzonatate (TESSALON) 100 MG capsule Take 1 capsule (100 mg total) by mouth every 8 (eight) hours as needed for cough. 02/13/23   Teodora Medici, FNP  carvedilol (COREG) 12.5 MG tablet Take 1 tablet (12.5 mg total) by mouth 2 (two) times daily. 12/26/22 12/26/23  Linward Natal, MD  cloNIDine (CATAPRES) 0.2 MG tablet Take 1 tablet (0.2 mg total) by mouth every 8 (eight) hours. 02/03/23 04/04/23  Sanjuan Dame, MD  fluticasone (FLONASE) 50 MCG/ACT nasal spray Place 1 spray into both nostrils daily. 02/13/23   Teodora Medici, FNP  hydrALAZINE (APRESOLINE) 100 MG tablet TAKE 1 TABLET BY MOUTH 3 TIMES DAILY. 08/20/22   Patwardhan, Reynold Bowen, MD  lipase/protease/amylase (CREON) 36000 UNITS CPEP capsule Take 2 capsules (72,000 Units total) by mouth 3 (three) times daily with meals. May also take 1 capsule (36,000 Units total) as needed (with snacks). 12/26/22   Linward Natal, MD  losartan (COZAAR) 100 MG tablet TAKE 1 TABLET BY MOUTH EVERY DAY 02/27/23   Linward Natal, MD  metoCLOPramide (REGLAN) 10  MG tablet Take 1 tablet (10 mg total) by mouth every 6 (six) hours. Patient not taking: Reported on 02/10/2023 10/28/22   Fransico Meadow, MD  Multiple Vitamins-Minerals (MULTIVITAMIN WITH MINERALS) tablet Take 1 tablet by mouth daily.    [provider]  ondansetron (ZOFRAN) 4 MG tablet Take 1 tablet (4 mg total) by mouth every 8 (eight) hours as needed for nausea or vomiting. 12/26/22   Linward Natal, MD  oseltamivir (TAMIFLU) 75 MG capsule Take 1 capsule (75 mg total) by mouth every 12 (twelve) hours. 02/13/23   Teodora Medici, FNP  pantoprazole (PROTONIX) 40 MG tablet Take 40 mg by mouth 2 (two) times daily before a meal.    [provider]  promethazine (PHENERGAN) 12.5 MG tablet Take 1 tablet (12.5 mg total) by mouth every 8 (eight) hours as needed for nausea or vomiting. 12/26/22   Linward Natal, MD  varenicline (CHANTIX) 0.5 MG tablet Take 1 tablet (0.5  mg total) by mouth 2 (two) times daily. 12/26/22   Linward Natal, MD  vitamin B-12 1000 MCG tablet Take 1 tablet (1,000 mcg total) by mouth daily. 06/15/22   Regalado, Cassie Freer, MD    Family History Family History  Problem Relation Age of Onset   Breast cancer Mother 20   Heart disease Mother    Hypertension Mother    Colon cancer Neg Hx    Esophageal cancer Neg Hx    Inflammatory bowel disease Neg Hx    Liver disease Neg Hx    Pancreatic cancer Neg Hx    Rectal cancer Neg Hx    Stomach cancer Neg Hx     Social History Social History   Tobacco Use   Smoking status: Every Day    Packs/day: 0.12    Years: 5.00    Additional pack years: 0.00    Total pack years: 0.60    Types: Cigarettes   Smokeless tobacco: Never   Tobacco comments:    1 pack/week  Vaping Use   Vaping Use: Never used  Substance Use Topics   Alcohol use: Not Currently    Comment: occasionally   Drug use: No     Allergies   Ativan [lorazepam] and Tape   Review of Systems Review of Systems  HENT:  Positive for postnasal drip and rhinorrhea.      Physical Exam Triage Vital Signs ED Triage Vitals [03/07/23 1527]  Enc Vitals Group     BP 119/80     Pulse Rate 97     Resp 16     Temp 98.1 F (36.7 C)     Temp src      SpO2 98 %     Weight      Height      Head Circumference      Peak Flow      Pain Score 1     Pain Loc      Pain Edu?      Excl. in [place]?    No data found.  Updated Vital Signs BP 119/80   Pulse 97   Temp 98.1 F (36.7 C)   Resp 16   LMP 03/01/2023   SpO2 98%   Visual Acuity Right Eye Distance:   Left Eye Distance:   Bilateral Distance:    Right Eye Near:   Left Eye Near:    Bilateral Near:     Physical Exam Constitutional:      Appearance: Normal appearance.  HENT:  Right Ear: Tympanic membrane and ear canal normal.     Left Ear: Tympanic membrane and ear canal normal.     Mouth/Throat:     Pharynx: Oropharynx is clear. Uvula midline.   Cardiovascular:     Rate and Rhythm: Normal rate and regular rhythm.     Heart sounds: Normal heart sounds.  Lymphadenopathy:     Cervical: No cervical adenopathy.  Neurological:     Mental Status: She is alert.      UC Treatments / Results  Labs (all labs ordered are listed, but only abnormal results are displayed) Labs Reviewed - No data to display  EKG   Radiology No results found.  Procedures Procedures (including critical care time)  Medications Ordered in UC Medications - No data to display  Initial Impression / Assessment and Plan / UC Course  I have reviewed the triage vital signs and the nursing notes.  Pertinent labs & imaging results that were available during my care of the patient were reviewed by me and considered in my medical decision making (see chart for details).    Plan: The diagnosis will be treated with the following: 1.  Allergies: A.  Claritin 10 mg once daily to help control allergies. B.  Flonase nasal spray, 2 sprays each nostril once daily to help control allergies. 2.  Laryngitis: A.  Voice rest. B.  Prednisone 10 mg 3 times a day for 4 days only to help reduce laryngitis type symptoms. 3.  Advised follow-up PCP return to urgent care as needed.  Final Clinical Impressions(s) / UC Diagnoses   Final diagnoses:  Allergy, initial encounter  Laryngitis     Discharge Instructions      Advised use of Flonase nasal spray, 2 sprays each nostril once daily to help decrease allergies and drainage. Advised take Claritin 10 mg once daily to help decrease allergies and drainage. Advised take prednisone 10 mg every 8 hours for 4 days only as this will help decrease allergies and also help to resolve the laryngitis.  Advised to observe voice rest over the next several days as the laryngitis resolves.  Advised follow-up PCP return to urgent care as needed.     ED Prescriptions     Medication Sig Dispense Auth. Provider   fluticasone  (FLONASE) 50 MCG/ACT nasal spray Place 2 sprays into both nostrils daily. 9.9 mL Nyoka Lint, PA-C   loratadine (CLARITIN) 10 MG tablet Take 1 tablet (10 mg total) by mouth daily. 30 tablet Nyoka Lint, PA-C   predniSONE (DELTASONE) 10 MG tablet Take 1 tablet (10 mg total) by mouth in the morning, at noon, and at bedtime. 12 tablet Nyoka Lint, PA-C      PDMP not reviewed this encounter.   Nyoka Lint, PA-C 03/07/23 [phone]

## 2023-03-07 NOTE — ED Triage Notes (Signed)
Pt presents to uc with laryngitis and hoarseness when talking since this morning. Pt reports mild soreness. Pt reports no otc medications.

## 2023-03-16 ENCOUNTER — Encounter: Payer: Self-pay | Admitting: Emergency Medicine

## 2023-03-16 ENCOUNTER — Other Ambulatory Visit: Payer: Self-pay

## 2023-03-16 ENCOUNTER — Ambulatory Visit
Admission: EM | Admit: 2023-03-16 | Discharge: 2023-03-16 | Disposition: A | Discharge status: Home / Self Care | Payer: 59 | Attending: Physician Assistant | Admitting: Physician Assistant

## 2023-03-16 DIAGNOSIS — J309 Allergic rhinitis, unspecified: Secondary | ICD-10-CM | POA: Diagnosis not present

## 2023-03-16 MED ORDER — PREDNISONE 50 MG PO TABS: ORAL_TABLET | ORAL | 0 refills | Status: DC

## 2023-03-16 MED ORDER — FLUTICASONE PROPIONATE 50 MCG/ACT NA SUSP: 1.0000 | Freq: Every day | NASAL | 0 refills | Status: DC

## 2023-03-16 MED ORDER — LORATADINE 10 MG PO TABS: 10.0000 mg | ORAL_TABLET | Freq: Every day | ORAL | 2 refills | Status: DC

## 2023-03-16 NOTE — Discharge Instructions (Addendum)
Return if any problems.

## 2023-03-16 NOTE — ED Provider Notes (Signed)
[facility]    CSN: [id number] Arrival date & time: 03/16/23  1515      History   Chief Complaint Chief Complaint  Patient presents with   Cough    HPI Robin Guerrero is a 37 y.o. female.   Pt compalins of nasal congestion, scratch throat.  Pt reports she has allergies and they are flaring up again.   The history is provided by the patient.  Cough Cough characteristics:  Non-productive Sputum characteristics:  Nondescript Timing:  Constant Chronicity:  New Context: exposure to allergens     Past Medical History:  Diagnosis Date   Descending thoracic dissection ([facility])    History of anemia    no current med.   Hypertension    states under control with med., has been on med. x 3 yr.   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   Alcohol use disorder, severe, in early remission ([place]) 07/07/2022   Hepatic steatosis 07/07/2022   Chronic generalized abdominal pain 07/07/2022   Abnormal endoscopy of upper gastrointestinal tract 07/07/2022   Portal hypertension ([place]) 07/07/2022   Portal vein thrombosis 07/07/2022   Iron deficiency anemia 06/11/2022   Acute blood loss anemia 06/04/2022   GERD without esophagitis 06/04/2022   History of alcohol abuse 06/04/2022   Pseudocyst, pancreas 06/04/2022   Acute pancreatitis 04/22/2022   Aortic dissection (H[facility]) 02/20/2022   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   ARF (acute renal failure) ([place]) 06/04/2021   Acute on chronic pancreatitis ([place]) 06/04/2021   H/O aortic dissection 01/31/2021   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[place]) 11/09/2020    Malignant hypertension    Nausea and vomiting    Back pain    Hypocalcemia 123456   Acute alcoholic pancreatitis 123456   Thrombocytopenia ([place]) 08/19/2020   Normocytic anemia 123456   Alcoholic pancreatitis XX123456   SIRS (systemic inflammatory response syndrome) ([place]) 08/18/2020   Hypokalemia 08/18/2020   Hypomagnesemia 08/18/2020   Essential hypertension 08/18/2020   Depression 08/18/2020   Tobacco use 08/18/2020    Past Surgical History:  Procedure Laterality Date   BIOPSY  09/11/2022   Procedure: BIOPSY;  Surgeon: Irving Copas., MD;  Location: [facility];  Service: Gastroenterology;;   ESOPHAGOGASTRODUODENOSCOPY N/A 06/06/2022   Procedure: ESOPHAGOGASTRODUODENOSCOPY (EGD);  Surgeon: Arta Silence, MD;  Location: [facility];  Service: Gastroenterology;  Laterality: N/A;   ESOPHAGOGASTRODUODENOSCOPY N/A 06/14/2022   Procedure: ESOPHAGOGASTRODUODENOSCOPY (EGD);  Surgeon: [name], MD;  Location: [facility];  Service: Gastroenterology;  Laterality: N/A;   ESOPHAGOGASTRODUODENOSCOPY (EGD) WITH PROPOFOL N/A 08/01/2022   Procedure: ESOPHAGOGASTRODUODENOSCOPY (EGD) WITH PROPOFOL;  Surgeon: Clarene Essex, MD;  [facility];  Service: Gastroenterology;  Laterality: N/A;   ESOPHAGOGASTRODUODENOSCOPY (EGD) WITH PROPOFOL N/A 09/11/2022   Procedure: ESOPHAGOGASTRODUODENOSCOPY (EGD) WITH PROPOFOL;  Surgeon: Rush Landmark Telford Nab., MD;  [facility];  Service: Gastroenterology;  Laterality: N/A;   EUS N/A 09/11/2022   Procedure: UPPER ENDOSCOPIC ULTRASOUND (EUS) RADIAL;  Surgeon: Irving Copas., MD;  [facility];  Service: Gastroenterology;  Laterality: N/A;   FOREIGN BODY REMOVAL Left 02/10/2015   Procedure: ATTEMPTED EXPLANTATION OF BIRTH CONTROL DEVICE LEFT ARM;  Surgeon: Johnathan Hausen, MD;  Location: [facility];  Service: General;  Laterality: Left;   FOREIGN BODY REMOVAL Left 05/12/2015   Procedure:  REMOVAL OF LEFT ARM BIRTH CONTROL DEVICE;  Surgeon: Johnathan Hausen, MD;  Location: [facility];  Service: General;  Laterality: Left;   NEUROLYTIC CELIAC PLEXUS N/A 09/11/2022   Procedure: NEUROLYTIC CELIAC PLEXUS;  Surgeon: Irving Copas., MD;  Location: [facility];  Service: Gastroenterology;  Laterality: N/A;   SUBMANDIBULAR GLAND EXCISION Right 05/01/2009    OB History     Gravida  4   Para  2   Term  2   Preterm      AB  2   Living  2      SAB      IAB  2   Ectopic      Multiple      Live Births  2            Home Medications    Prior to Admission medications   Medication Sig Start Date End Date Taking? Authorizing Provider  fluticasone (FLONASE) 50 MCG/ACT nasal spray Place 1 spray into both nostrils daily. 03/16/23 04/15/23 Yes Caryl Ada K, PA-C  loratadine (CLARITIN) 10 MG tablet Take 1 tablet (10 mg total) by mouth daily. 03/16/23 03/15/24 Yes Fransico Meadow, PA-C  predniSONE (DELTASONE) 50 MG tablet One tablet a day 03/16/23  Yes Fransico Meadow, [place]  acetaminophen (TYLENOL) 500 MG tablet Take 1,000 mg by mouth every 6 (six) hours as needed for moderate pain.    [provider]  amLODipine (NORVASC) 10 MG tablet Take 1 tablet (10 mg total) by mouth daily. 03/08/22   Patwardhan, Reynold Bowen, MD  azithromycin (ZITHROMAX) 250 MG tablet Take 1 tablet (250 mg total) by mouth daily. Take first 2 tablets together, then 1 every day until finished. 02/17/23   Lamptey, Myrene Galas, MD  benzonatate (TESSALON) 100 MG capsule Take 1 capsule (100 mg total) by mouth every 8 (eight) hours as needed for cough. 02/13/23   Teodora Medici, FNP  carvedilol (COREG) 12.5 MG tablet Take 1 tablet (12.5 mg total) by mouth 2 (two) times daily. 12/26/22 12/26/23  Linward Natal, MD  cloNIDine (CATAPRES) 0.2 MG tablet Take 1 tablet (0.2 mg total) by mouth every 8 (eight) hours. 02/03/23 04/04/23  Sanjuan Dame, MD  hydrALAZINE (APRESOLINE) 100 MG tablet TAKE 1  TABLET BY MOUTH 3 TIMES DAILY. 08/20/22   Patwardhan, Reynold Bowen, MD  lipase/protease/amylase (CREON) 36000 UNITS CPEP capsule Take 2 capsules (72,000 Units total) by mouth 3 (three) times daily with meals. May also take 1 capsule (36,000 Units total) as needed (with snacks). 12/26/22   Linward Natal, MD  losartan (COZAAR) 100 MG tablet TAKE 1 TABLET BY MOUTH EVERY DAY 02/27/23   Linward Natal, MD  metoCLOPramide (REGLAN) 10 MG tablet Take 1 tablet (10 mg total) by mouth every 6 (six) hours. Patient not taking: Reported on 02/10/2023 10/28/22   Fransico Meadow, MD  Multiple Vitamins-Minerals (MULTIVITAMIN WITH MINERALS) tablet Take 1 tablet by mouth daily.    [provider]  ondansetron (ZOFRAN) 4 MG tablet Take 1 tablet (4 mg total) by mouth every 8 (eight) hours as needed for nausea or vomiting. 12/26/22   Linward Natal, MD  oseltamivir (TAMIFLU) 75 MG capsule Take 1 capsule (75 mg total) by mouth every 12 (twelve) hours. 02/13/23   Teodora Medici, FNP  pantoprazole (PROTONIX) 40 MG tablet Take 40 mg by mouth 2 (two) times  daily before a meal.    [provider]  promethazine (PHENERGAN) 12.5 MG tablet Take 1 tablet (12.5 mg total) by mouth every 8 (eight) hours as needed for nausea or vomiting. 12/26/22   Linward Natal, MD  varenicline (CHANTIX) 0.5 MG tablet Take 1 tablet (0.5 mg total) by mouth 2 (two) times daily. 12/26/22   Linward Natal, MD  vitamin B-12 1000 MCG tablet Take 1 tablet (1,000 mcg total) by mouth daily. 06/15/22   Regalado, Cassie Freer, MD    Family History Family History  Problem Relation Age of Onset   Breast cancer Mother 36   Heart disease Mother    Hypertension Mother    Colon cancer Neg Hx    Esophageal cancer Neg Hx    Inflammatory bowel disease Neg Hx    Liver disease Neg Hx    Pancreatic cancer Neg Hx    Rectal cancer Neg Hx    Stomach cancer Neg Hx     Social History Social History   Tobacco Use   Smoking status: Every Day     Packs/day: 0.12    Years: 5.00    Additional pack years: 0.00    Total pack years: 0.60    Types: Cigarettes   Smokeless tobacco: Never   Tobacco comments:    1 pack/week  Vaping Use   Vaping Use: Never used  Substance Use Topics   Alcohol use: Not Currently    Comment: occasionally   Drug use: No     Allergies   Ativan [lorazepam] and Tape   Review of Systems Review of Systems  Respiratory:  Positive for cough.   All other systems reviewed and are negative.    Physical Exam Triage Vital Signs ED Triage Vitals  Enc Vitals Group     BP 03/16/23 1541 135/80     Pulse Rate 03/16/23 1541 (!) 110     Resp 03/16/23 1541 18     Temp 03/16/23 1541 98.5 F (36.9 C)     Temp Source 03/16/23 1541 Oral     SpO2 03/16/23 1541 95 %     Weight --      Height --      Head Circumference --      Peak Flow --      Pain Score 03/16/23 1542 5     Pain Loc --      Pain Edu? --      Excl. in [place]? --    No data found.  Updated Vital Signs BP 135/80 (BP Location: Left Arm)   Pulse (!) 110   Temp 98.5 F (36.9 C) (Oral)   Resp 18   LMP 03/01/2023   SpO2 95%   Visual Acuity Right Eye Distance:   Left Eye Distance:   Bilateral Distance:    Right Eye Near:   Left Eye Near:    Bilateral Near:     Physical Exam Vitals and nursing note reviewed.  Constitutional:      Appearance: She is well-developed.  HENT:     Head: Normocephalic.  Pulmonary:     Effort: Pulmonary effort is normal.  Abdominal:     General: There is no distension.  Musculoskeletal:        General: Normal range of motion.     Cervical back: Normal range of motion.  Neurological:     Mental Status: She is alert and oriented to person, place, and time.      UC Treatments / Results  Labs (all labs ordered are listed, but only abnormal results are displayed) Labs Reviewed - No data to display  EKG   Radiology No results found.  Procedures Procedures (including critical care  time)  Medications Ordered in UC Medications - No data to display  Initial Impression / Assessment and Plan / UC Course  I have reviewed the triage vital signs and the nursing notes.  Pertinent labs & imaging results that were available during my care of the patient were reviewed by me and considered in my medical decision making (see chart for details).      Final Clinical Impressions(s) / UC Diagnoses   Final diagnoses:  Allergic rhinitis, unspecified seasonality, unspecified trigger     Discharge Instructions      Return if any problems.    ED Prescriptions     Medication Sig Dispense Auth. Provider   fluticasone (FLONASE) 50 MCG/ACT nasal spray Place 1 spray into both nostrils daily. 11.1 mL Jasimine Simms K, PA-C   loratadine (CLARITIN) 10 MG tablet Take 1 tablet (10 mg total) by mouth daily. 30 tablet Oluwatamilore Starnes K, PA-C   predniSONE (DELTASONE) 50 MG tablet One tablet a day 5 tablet Fransico Meadow, [place]      PDMP not reviewed this encounter.   Fransico Meadow, [place] 03/16/23 [id number]

## 2023-03-16 NOTE — ED Triage Notes (Signed)
Pt here for cough and congestion x 3 days; pt sts recent hx of similar; pt also sts hoarse voice

## 2023-03-17 NOTE — Progress Notes (Signed)
ASSESSMENT & PLAN:  Robin Guerrero is a 37 y.o. female with stable type B aortic dissection.  Continue medical management with goal systolic blood pressure less than 130.  Goal heart rate less than 80.  Continue annual CTA surveillance to ensure no aneurysmal degeneration.   She was recently admitted to the hospital in mid March for left shoulder pain.  This was concerning in the setting of her known chronic type B aortic dissection.  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  The patient improved with medical therapy and was discharged.  We a long discussion about prophylactic intervention for chronic type B dissection.  I quoted her the limited data available to [place] in this problem.  I explained that stenting could reduce the risk of aneurysmal degeneration and promote healthy remodeling of the thoracic aorta.  This does carry a 5% risk of stroke, which could be debilitating in this young woman.  She is understandably nervous about her chronic type B dissection.  I encouraged her to avoid intervention if at all possible.  We will continue to monitor this in the outpatient setting.  SUBJECTIVE:  Admitted to hospital 02/20/2022 to 02/24/2022 for left shoulder pain.  A CT angiogram was performed showing possibly new intimal flap immediately distal left subclavian artery.  She is now pain-free.  She is understandably anxious about her dissection and the intermittent need to be hospitalized.  OBJECTIVE:  LMP 03/01/2023   Constitutional: well appearing. no acute distress. Cardiac: RRR. Vascular: 2+ DPs Pulmonary: unlabored Abdominal: soft     Latest Ref Rng & Units 12/26/2022    2:47 PM 12/19/2022    3:01 AM 12/18/2022    8:34 AM  CBC  WBC 3.4 - 10.8 x10E3/uL 6.4  7.5  11.8   Hemoglobin 11.1 - 15.9 g/dL 9.5  8.1  9.6   Hematocrit 34.0 - 46.6 % 30.2  25.3  28.3   Platelets 150 - 450 x10E3/uL 321  180  192         Latest Ref Rng & Units  12/26/2022    2:47 PM 12/21/2022    3:11 AM 12/20/2022    4:39 AM  CMP  Glucose 70 - 99 mg/dL 83  98  112   BUN 6 - 20 mg/dL 12  <5  9   Creatinine 0.57 - 1.00 mg/dL 0.61  0.70  0.73   Sodium 134 - 144 mmol/L 141  137  136   Potassium 3.5 - 5.2 mmol/L 4.0  3.6  4.4   Chloride 96 - 106 mmol/L 104  105  111   CO2 20 - 29 mmol/L 25  23  22    Calcium 8.7 - 10.2 mg/dL 9.0  8.5  8.0   Total Protein 6.0 - 8.5 g/dL 6.8     Total Bilirubin 0.0 - 1.2 mg/dL 0.5     Alkaline Phos 44 - 121 IU/L 57     AST 0 - 40 IU/L 10     ALT 0 - 32 IU/L 9       CrCl cannot be calculated (Patient's most recent lab result is older than the maximum 21 days allowed.).  CTA personally reviewed.  The scan appears overall stable from the last image in March.  I see no evidence of aneurysmal degeneration that would require intervention more urgently.  [name]. Stanford Breed, MD [facility] Phone Number: [phone] 03/17/2023 2:23 PM

## 2023-03-18 ENCOUNTER — Ambulatory Visit (INDEPENDENT_AMBULATORY_CARE_PROVIDER_SITE_OTHER): Payer: 59 | Admitting: Vascular Surgery

## 2023-03-18 ENCOUNTER — Encounter: Payer: Self-pay | Admitting: Vascular Surgery

## 2023-03-18 VITALS — BP 112/79 | HR 107 | Temp 99.0°F | Resp 20 | Ht 69.0 in | Wt 184.0 lb

## 2023-03-18 DIAGNOSIS — I71019 Dissection of thoracic aorta, unspecified: Secondary | ICD-10-CM

## 2023-03-29 ENCOUNTER — Other Ambulatory Visit: Payer: Self-pay | Admitting: Cardiology

## 2023-03-29 DIAGNOSIS — I10 Essential (primary) hypertension: Secondary | ICD-10-CM

## 2023-04-27 ENCOUNTER — Other Ambulatory Visit: Payer: Self-pay

## 2023-04-27 ENCOUNTER — Emergency Department (HOSPITAL_COMMUNITY)
Admission: EM | Admit: 2023-04-27 | Discharge: 2023-04-27 | Disposition: A | Discharge status: Home / Self Care | Payer: 59 | Attending: Emergency Medicine | Admitting: Emergency Medicine

## 2023-04-27 DIAGNOSIS — R519 Headache, unspecified: Secondary | ICD-10-CM | POA: Insufficient documentation

## 2023-04-27 DIAGNOSIS — Y92481 Parking lot as the place of occurrence of the external cause: Secondary | ICD-10-CM | POA: Insufficient documentation

## 2023-04-27 MED ORDER — IBUPROFEN 600 MG PO TABS: 600.0000 mg | ORAL_TABLET | Freq: Four times a day (QID) | ORAL | 0 refills | Status: AC | PRN

## 2023-04-27 MED ORDER — CYCLOBENZAPRINE HCL 10 MG PO TABS: 10.0000 mg | ORAL_TABLET | Freq: Two times a day (BID) | ORAL | 0 refills | Status: DC | PRN
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
## 2023-05-02 ENCOUNTER — Other Ambulatory Visit: Payer: Self-pay | Admitting: Cardiology

## 2023-05-02 DIAGNOSIS — I10 Essential (primary) hypertension: Secondary | ICD-10-CM

## 2023-05-24 ENCOUNTER — Ambulatory Visit: Payer: Self-pay

## 2023-07-15 ENCOUNTER — Ambulatory Visit (INDEPENDENT_AMBULATORY_CARE_PROVIDER_SITE_OTHER): Payer: 59 | Admitting: Obstetrics and Gynecology

## 2023-07-15 ENCOUNTER — Other Ambulatory Visit (HOSPITAL_COMMUNITY)
Admission: RE | Admit: 2023-07-15 | Discharge: 2023-07-15 | Disposition: A | Discharge status: Home / Self Care | Payer: 59 | Source: Ambulatory Visit | Attending: Obstetrics and Gynecology | Admitting: Obstetrics and Gynecology

## 2023-07-15 ENCOUNTER — Encounter: Payer: Self-pay | Admitting: Obstetrics and Gynecology

## 2023-07-15 VITALS — BP 147/100 | HR 98 | Ht 68.0 in | Wt 202.7 lb

## 2023-07-15 DIAGNOSIS — Z3202 Encounter for pregnancy test, result negative: Secondary | ICD-10-CM

## 2023-07-15 DIAGNOSIS — Z3042 Encounter for surveillance of injectable contraceptive: Secondary | ICD-10-CM

## 2023-07-15 DIAGNOSIS — Z113 Encounter for screening for infections with a predominantly sexual mode of transmission: Secondary | ICD-10-CM | POA: Diagnosis not present

## 2023-07-15 DIAGNOSIS — N926 Irregular menstruation, unspecified: Secondary | ICD-10-CM

## 2023-07-15 LAB — POCT URINE PREGNANCY: Preg Test, Ur: NEGATIVE

## 2023-07-15 MED ORDER — MEDROXYPROGESTERONE ACETATE 150 MG/ML IM SUSY
150.0000 mg | PREFILLED_SYRINGE | Freq: Once | INTRAMUSCULAR | Status: AC
  Administered 2023-07-15: 150 mg via INTRAMUSCULAR

## 2023-07-15 NOTE — Progress Notes (Unsigned)
Starting bleeding around 7/10, then stopped. Resumed bleeding 7/18 - currently.  Pt wants to start depo, last unprotected intercourse within last 2 weeks.

## 2023-07-15 NOTE — Progress Notes (Unsigned)
  CC:  Subjective:    Patient ID: Robin Guerrero, female    DOB: [birth year], 37 y.o.   MRN: [id number]  HPI 37 year old G4P2, SVD x 2.  Pt had ,nses on 7/10 and then again on 7/18 to present.  Pt had negative UPT.  She want depo provera to make her stop bleeding.  Pt advised that depo provera is not guaranteed to create amenorrhea.  It in fact can cause worse irregular bleeding as well as weight gain.  After vigorous discussion of alternatives, ie pills, patch, vaginal ring or progesterone IUD, pt will still have trial of depo provera for at least three months.  She is again aware of the side effects.  Pt also aware progesterone effect will not be immediate to stop current bleeding.  Pt is considering depo provera for now and then transitioning to progesterone IUD.  Pt advised she should premedicate with ibuprofen or naprosyn 30 minutes before IUD placement.   Review of Systems     Objective:   Physical Exam Vitals:   07/15/23 1517 07/15/23 1522  BP: (!) 146/101 (!) 147/100  Pulse: 99 98         Assessment & Plan:   1. Irregular menses Depo prover a for amnenorrhea today with possible transition to mirena in 3 months. - POCT urine pregnancy  2. Screening for STDs (sexually transmitted diseases) Per pt request  - Cervicovaginal ancillary only - Hepatitis C antibody - Hepatitis B surface antigen - HIV Antibody (routine testing w rflx) - RPR  3. Encounter for management and injection of depo-Provera See above    Warden Fillers, MD Faculty Attending, [facility]

## 2023-09-01 ENCOUNTER — Encounter: Payer: Self-pay | Admitting: *Deleted

## 2023-09-01 ENCOUNTER — Telehealth: Payer: Self-pay

## 2023-09-01 ENCOUNTER — Ambulatory Visit
Admission: EM | Admit: 2023-09-01 | Discharge: 2023-09-01 | Disposition: A | Discharge status: Home / Self Care | Payer: 59 | Attending: Internal Medicine | Admitting: Internal Medicine

## 2023-09-01 ENCOUNTER — Other Ambulatory Visit: Payer: Self-pay

## 2023-09-01 DIAGNOSIS — Z20822 Contact with and (suspected) exposure to covid-19: Secondary | ICD-10-CM | POA: Insufficient documentation

## 2023-09-01 DIAGNOSIS — J069 Acute upper respiratory infection, unspecified: Secondary | ICD-10-CM | POA: Insufficient documentation

## 2023-09-01 MED ORDER — BENZONATATE 100 MG PO CAPS: 100.0000 mg | ORAL_CAPSULE | Freq: Three times a day (TID) | ORAL | 0 refills | Status: DC | PRN

## 2023-09-01 NOTE — Discharge Instructions (Addendum)
Covid test pending. Will call if it is positive.

## 2023-09-01 NOTE — ED Triage Notes (Signed)
Cough, sneezing, body aches x 3 days. Requests covid test

## 2023-09-01 NOTE — ED Provider Notes (Signed)
[facility]    CSN: [id number] Arrival date & time: 09/01/23  1835      History   Chief Complaint Chief Complaint  Patient presents with   Cough    HPI Robin Guerrero is a 37 y.o. female.   Patient presenting with cough, sneezing, body aches for 3 days.  Her children have had similar symptoms.  Denies chest pain, shortness of breath.  Denies history of asthma.  Patient has taken over the counter medications for symptoms.  Patient requesting COVID testing.   Cough   Past Medical History:  Diagnosis Date   Descending thoracic dissection (HCC)    History of anemia    no current med.   Hypertension    states under control with med., has been on med. x 3 yr.   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   Alcohol use disorder, severe, in early remission (HCC) 07/07/2022   Hepatic steatosis 07/07/2022   Chronic generalized abdominal pain 07/07/2022   Abnormal endoscopy of upper gastrointestinal tract 07/07/2022   Portal hypertension (HCC) 07/07/2022   Portal vein thrombosis 07/07/2022   Iron deficiency anemia 06/11/2022   Acute blood loss anemia 06/04/2022   GERD without esophagitis 06/04/2022   History of alcohol abuse 06/04/2022   Pseudocyst, pancreas 06/04/2022   Acute pancreatitis 04/22/2022   Aortic dissection (HCC) 02/20/2022   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   ARF (acute renal failure) (HCC) 06/04/2021   Acute on chronic pancreatitis (HCC) 06/04/2021   H/O aortic dissection 01/31/2021   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HCC) 11/09/2020   Malignant hypertension     Nausea and vomiting    Back pain    Hypocalcemia 08/19/2020   Acute alcoholic pancreatitis 08/19/2020   Thrombocytopenia (HCC) 08/19/2020   Normocytic anemia 08/19/2020   Alcoholic pancreatitis 08/18/2020   SIRS (systemic inflammatory response syndrome) (HCC) 08/18/2020   Hypokalemia 08/18/2020   Hypomagnesemia 08/18/2020   Essential hypertension 08/18/2020   Depression 08/18/2020   Tobacco use 08/18/2020    Past Surgical History:  Procedure Laterality Date   BIOPSY  09/11/2022   Procedure: BIOPSY;  Surgeon: Lemar Lofty., MD;  Location: [facility];  Service: Gastroenterology;;   ESOPHAGOGASTRODUODENOSCOPY N/A 06/06/2022   Procedure: ESOPHAGOGASTRODUODENOSCOPY (EGD);  Surgeon: Willis Modena, MD;  Location: [facility];  Service: Gastroenterology;  Laterality: N/A;   ESOPHAGOGASTRODUODENOSCOPY N/A 06/14/2022   Procedure: ESOPHAGOGASTRODUODENOSCOPY (EGD);  Surgeon: Kathi Der, MD;  Location: [facility];  Service: Gastroenterology;  Laterality: N/A;   ESOPHAGOGASTRODUODENOSCOPY (EGD) WITH PROPOFOL N/A 08/01/2022   Procedure: ESOPHAGOGASTRODUODENOSCOPY (EGD) WITH PROPOFOL;  Surgeon: Vida Rigger, MD;  Location: [facility];  Service: Gastroenterology;  Laterality: N/A;   ESOPHAGOGASTRODUODENOSCOPY (EGD) WITH PROPOFOL N/A 09/11/2022   Procedure: ESOPHAGOGASTRODUODENOSCOPY (EGD) WITH PROPOFOL;  Surgeon: Meridee Score Netty Starring., MD;  Location: [facility];  Service: Gastroenterology;  Laterality: N/A;   EUS N/A 09/11/2022   Procedure: UPPER ENDOSCOPIC ULTRASOUND (EUS) RADIAL;  Surgeon: Lemar Lofty., MD;  Location: [facility];  Service: Gastroenterology;  Laterality: N/A;   FOREIGN BODY REMOVAL Left 02/10/2015   Procedure: ATTEMPTED EXPLANTATION OF BIRTH CONTROL DEVICE LEFT ARM;  Surgeon: Luretha Murphy, MD;  Location: [facility];  Service: General;  Laterality: Left;   FOREIGN BODY REMOVAL Left 05/12/2015   Procedure: REMOVAL OF LEFT ARM BIRTH  CONTROL DEVICE;  Surgeon: Luretha Murphy, MD;  Location: [facility];  Service: General;  Laterality: Left;   NEUROLYTIC CELIAC PLEXUS N/A 09/11/2022   Procedure: NEUROLYTIC CELIAC PLEXUS;  Surgeon: Lemar Lofty., MD;  Location: [facility];  Service: Gastroenterology;  Laterality: N/A;   SUBMANDIBULAR GLAND EXCISION Right 05/01/2009    OB History     Gravida  4   Para  2   Term  2   Preterm      AB  2   Living  2      SAB      IAB  2   Ectopic      Multiple      Live Births  2            Home Medications    Prior to Admission medications   Medication Sig Start Date End Date Taking? Authorizing Provider  amLODipine (NORVASC) 10 MG tablet TAKE 1 TABLET BY MOUTH EVERY DAY 05/02/23  Yes Patwardhan, Manish J, MD  benzonatate (TESSALON) 100 MG capsule Take 1 capsule (100 mg total) by mouth every 8 (eight) hours as needed for cough. 09/01/23  Yes , Rolly Salter E, FNP  carvedilol (COREG) 12.5 MG tablet Take 1 tablet (12.5 mg total) by mouth 2 (two) times daily. 12/26/22 12/26/23 Yes Adron Bene, MD  cloNIDine (CATAPRES) 0.2 MG tablet Take 1 tablet (0.2 mg total) by mouth every 8 (eight) hours. 02/03/23 09/01/23 Yes Evlyn Kanner, MD  fluticasone (FLONASE) 50 MCG/ACT nasal spray Place 1 spray into both nostrils daily. 03/16/23 09/01/23 Yes Cheron Schaumann K, PA-C  hydrALAZINE (APRESOLINE) 100 MG tablet TAKE 1 TABLET BY MOUTH THREE TIMES A DAY 05/02/23  Yes Patwardhan, Manish J, MD  lipase/protease/amylase (CREON) 36000 UNITS CPEP capsule Take 2 capsules (72,000 Units total) by mouth 3 (three) times daily with meals. May also take 1 capsule (36,000 Units total) as needed (with snacks). 12/26/22  Yes Adron Bene, MD  losartan (COZAAR) 100 MG tablet TAKE 1 TABLET BY MOUTH EVERY DAY 02/27/23  Yes Adron Bene, MD  acetaminophen (TYLENOL) 500 MG tablet Take 1,000 mg by mouth every 6 (six) hours as needed for moderate pain.    [provider]   cyclobenzaprine (FLEXERIL) 10 MG tablet Take 1 tablet (10 mg total) by mouth 2 (two) times daily as needed for muscle spasms. Patient not taking: Reported on 07/15/2023 04/27/23   Fayrene Helper, PA-C  ibuprofen (ADVIL) 600 MG tablet Take 1 tablet (600 mg total) by mouth every 6 (six) hours as needed. 04/27/23   Fayrene Helper, PA-C  loratadine (CLARITIN) 10 MG tablet Take 1 tablet (10 mg total) by mouth daily. 03/16/23 03/15/24  Elson Areas, PA-C  metoCLOPramide (REGLAN) 10 MG tablet Take 1 tablet (10 mg total) by mouth every 6 (six) hours. 10/28/22   Rondel Baton, MD  Multiple Vitamins-Minerals (MULTIVITAMIN WITH MINERALS) tablet Take 1 tablet by mouth daily. Patient not taking: Reported on 07/15/2023    [provider]  ondansetron (ZOFRAN) 4 MG tablet Take 1 tablet (4 mg total) by mouth every 8 (eight) hours as needed for nausea or vomiting. 12/26/22   Adron Bene, MD  oseltamivir (TAMIFLU) 75 MG capsule Take 1 capsule (75 mg total) by mouth every 12 (twelve) hours. Patient not taking: Reported on 07/15/2023 02/13/23   Gustavus Bryant, FNP  pantoprazole (PROTONIX) 40 MG tablet Take 40 mg by mouth 2 (two) times daily before a meal. Patient not taking: Reported on 07/15/2023    [provider]  predniSONE (DELTASONE) 50 MG tablet One tablet a day Patient not taking: Reported on 07/15/2023 03/16/23   Elson Areas, PA-C  promethazine (PHENERGAN) 12.5 MG tablet Take 1 tablet (12.5 mg total) by mouth every 8 (eight) hours as needed for nausea or vomiting. 12/26/22   Adron Bene, MD  varenicline (CHANTIX) 0.5 MG tablet Take 1 tablet (0.5 mg total) by mouth 2 (two) times daily. Patient not taking: Reported on 07/15/2023 12/26/22   Adron Bene, MD  vitamin B-12 1000 MCG tablet Take 1 tablet (1,000 mcg total) by mouth daily. Patient not taking: Reported on 07/15/2023 06/15/22   Alba Cory, MD    Family History Family History  Problem Relation Age of Onset   Breast cancer  Mother 38   Heart disease Mother    Hypertension Mother    Colon cancer Neg Hx    Esophageal cancer Neg Hx    Inflammatory bowel disease Neg Hx    Liver disease Neg Hx    Pancreatic cancer Neg Hx    Rectal cancer Neg Hx    Stomach cancer Neg Hx     Social History Social History   Tobacco Use   Smoking status: Every Day    Current packs/day: 0.12    Average packs/day: 0.1 packs/day for 5.0 years (0.6 ttl pk-yrs)    Types: Cigarettes   Smokeless tobacco: Never   Tobacco comments:    1 pack/week  Vaping Use   Vaping status: Never Used  Substance Use Topics   Alcohol use: Not Currently    Comment: occasionally   Drug use: No     Allergies   Ativan [lorazepam] and Tape   Review of Systems Review of Systems Per HPI  Physical Exam Triage Vital Signs ED Triage Vitals  Encounter Vitals Group     BP 09/01/23 1852 (!) 129/90     Systolic BP Percentile --      Diastolic BP Percentile --      Pulse Rate 09/01/23 1852 91     Resp 09/01/23 1852 18     Temp 09/01/23 1852 99.2 F (37.3 C)     Temp Source 09/01/23 1852 Oral     SpO2 09/01/23 1852 98 %     Weight --      Height --      Head Circumference --      Peak Flow --      Pain Score 09/01/23 1849 5     Pain Loc --      Pain Education --      Exclude from Growth Chart --    No data found.  Updated Vital Signs BP (!) 129/90 (BP Location: Right Arm)   Pulse 91   Temp 99.2 F (37.3 C) (Oral)   Resp 18   SpO2 98%   Visual Acuity Right Eye Distance:   Left Eye Distance:   Bilateral Distance:    Right Eye Near:   Left Eye Near:    Bilateral Near:     Physical Exam Constitutional:      General: She is not in acute distress.    Appearance: Normal appearance. She is not toxic-appearing or diaphoretic.  HENT:     Head: Normocephalic and atraumatic.     Right Ear: Tympanic membrane and ear canal normal.  Left Ear: Tympanic membrane and ear canal normal.     Nose: Congestion present.      Mouth/Throat:     Mouth: Mucous membranes are moist.     Pharynx: Posterior oropharyngeal erythema present.  Eyes:     Extraocular Movements: Extraocular movements intact.     Conjunctiva/sclera: Conjunctivae normal.     Pupils: Pupils are equal, round, and reactive to light.  Cardiovascular:     Rate and Rhythm: Normal rate and regular rhythm.     Pulses: Normal pulses.     Heart sounds: Normal heart sounds.  Pulmonary:     Effort: Pulmonary effort is normal. No respiratory distress.     Breath sounds: Normal breath sounds. No stridor. No wheezing, rhonchi or rales.  Abdominal:     General: Abdomen is flat. Bowel sounds are normal.     Palpations: Abdomen is soft.  Musculoskeletal:        General: Normal range of motion.     Cervical back: Normal range of motion.  Skin:    General: Skin is warm and dry.  Neurological:     General: No focal deficit present.     Mental Status: She is alert and oriented to person, place, and time. Mental status is at baseline.  Psychiatric:        Mood and Affect: Mood normal.        Behavior: Behavior normal.      UC Treatments / Results  Labs (all labs ordered are listed, but only abnormal results are displayed) Labs Reviewed  SARS CORONAVIRUS 2 (TAT 6-24 HRS)    EKG   Radiology No results found.  Procedures Procedures (including critical care time)  Medications Ordered in UC Medications - No data to display  Initial Impression / Assessment and Plan / UC Course  I have reviewed the triage vital signs and the nursing notes.  Pertinent labs & imaging results that were available during my care of the patient were reviewed by me and considered in my medical decision making (see chart for details).     Patient presents with symptoms likely from a viral upper respiratory infection.  Do not suspect underlying cardiopulmonary process. Symptoms seem unlikely related to ACS, CHF or COPD exacerbations, pneumonia, pneumothorax. Patient  is nontoxic appearing and not in need of emergent medical intervention. Covid test pending.   Recommended symptom control with over the counter medications. Patient sent cough medication.   Return if symptoms fail to improve in 1-2 weeks or you develop shortness of breath, chest pain, severe headache. Patient states understanding and is agreeable.  Discharged with PCP followup.  Final Clinical Impressions(s) / UC Diagnoses   Final diagnoses:  Viral upper respiratory tract infection with cough  Encounter for laboratory testing for COVID-19 virus     Discharge Instructions      Covid test pending. Will call if it is positive.     ED Prescriptions     Medication Sig Dispense Auth. Provider   benzonatate (TESSALON) 100 MG capsule Take 1 capsule (100 mg total) by mouth every 8 (eight) hours as needed for cough. 21 capsule [place], Acie Fredrickson, [place]      PDMP not reviewed this encounter.   Gustavus Bryant, [place] 09/01/23 [phone]

## 2023-09-02 LAB — SARS CORONAVIRUS 2 (TAT 6-24 HRS): SARS Coronavirus 2: NEGATIVE

## 2023-09-28 ENCOUNTER — Ambulatory Visit
Admission: EM | Admit: 2023-09-28 | Discharge: 2023-09-28 | Disposition: A | Discharge status: Home / Self Care | Payer: 59 | Attending: Internal Medicine | Admitting: Internal Medicine

## 2023-09-28 DIAGNOSIS — Z1152 Encounter for screening for COVID-19: Secondary | ICD-10-CM | POA: Diagnosis not present

## 2023-09-28 DIAGNOSIS — B349 Viral infection, unspecified: Secondary | ICD-10-CM | POA: Insufficient documentation

## 2023-09-28 MED ORDER — PROMETHAZINE-DM 6.25-15 MG/5ML PO SYRP: 5.0000 mL | ORAL_SOLUTION | Freq: Three times a day (TID) | ORAL | 0 refills | Status: DC | PRN

## 2023-09-28 MED ORDER — PREDNISONE 10 MG PO TABS: 30.0000 mg | ORAL_TABLET | Freq: Every day | ORAL | 0 refills | Status: AC

## 2023-09-28 MED ORDER — CETIRIZINE HCL 10 MG PO TABS: 10.0000 mg | ORAL_TABLET | Freq: Every day | ORAL | 0 refills | Status: DC

## 2023-09-28 NOTE — Discharge Instructions (Addendum)
We will notify you of your test results as they arrive and may take between about 24 hours.  I encourage you to sign up for MyChart if you have not already done so as this can be the easiest way for [place] to communicate results to you online or through a phone app.  Generally, we only contact you if it is a positive test result.  In the meantime, if you develop worsening symptoms including fever, chest pain, shortness of breath despite our current treatment plan then please report to the emergency room as this may be a sign of worsening status from possible viral infection.  Otherwise, we will manage this as a viral syndrome. For sore throat or cough try using a honey-based tea. Use 3 teaspoons of honey with juice squeezed from half lemon. Place shaved pieces of ginger into 1/2-1 cup of water and warm over stove top. Then mix the ingredients and repeat every 4 hours as needed. Please take Tylenol 650mg  every 6 hours for aches and pains, fevers. Hydrate very well with at least 2 liters of water. Eat light meals such as soups to replenish electrolytes and soft fruits, veggies. Start an antihistamine like Zyrtec (10mg  daily) for postnasal drainage, sinus congestion.  You can take this together with prednisone.  Use the cough medications as needed.

## 2023-09-28 NOTE — ED Provider Notes (Signed)
[facility]  Note:  This document was prepared using Conservation officer, historic buildings and may include unintentional dictation errors.  MRN: [id number] DOB: [birth year]  Subjective:   Robin Guerrero is a 37 y.o. female presenting for 3-day history of bodyaches, hoarseness, coughing, chills, malaise and significant weakness, fatigue, loss of taste and smell.  Has multiple risk factors for COVID-19 including chronic pancreatitis, hypertension, smoking, history of bronchitis, recent history of SARS from earlier this year related to stent placement for her pancreas.  Also has a history of aortic dissection.  Last GFR was 184mL/min from 12/26/2022.  No active chest pain, shortness of breath or wheezing.  No current facility-administered medications for this encounter.  Current Outpatient Medications:    acetaminophen (TYLENOL) 500 MG tablet, Take 1,000 mg by mouth every 6 (six) hours as needed for moderate pain., Disp: , Rfl:    amLODipine (NORVASC) 10 MG tablet, TAKE 1 TABLET BY MOUTH EVERY DAY, Disp: 90 tablet, Rfl: 1   carvedilol (COREG) 12.5 MG tablet, Take 1 tablet (12.5 mg total) by mouth 2 (two) times daily., Disp: 60 tablet, Rfl: 11   cloNIDine (CATAPRES) 0.2 MG tablet, Take 1 tablet (0.2 mg total) by mouth every 8 (eight) hours., Disp: 90 tablet, Rfl: 1   fluticasone (FLONASE) 50 MCG/ACT nasal spray, Place 1 spray into both nostrils daily., Disp: 11.1 mL, Rfl: 0   hydrALAZINE (APRESOLINE) 100 MG tablet, TAKE 1 TABLET BY MOUTH THREE TIMES A DAY, Disp: 270 tablet, Rfl: 1   ibuprofen (ADVIL) 600 MG tablet, Take 1 tablet (600 mg total) by mouth every 6 (six) hours as needed., Disp: 30 tablet, Rfl: 0   lipase/protease/amylase (CREON) 36000 UNITS CPEP capsule, Take 2 capsules (72,000 Units total) by mouth 3 (three) times daily with meals. May also take 1 capsule (36,000 Units total) as needed (with snacks)., Disp: 240 capsule, Rfl: 11   loratadine (CLARITIN) 10 MG  tablet, Take 1 tablet (10 mg total) by mouth daily., Disp: 30 tablet, Rfl: 2   losartan (COZAAR) 100 MG tablet, TAKE 1 TABLET BY MOUTH EVERY DAY, Disp: 90 tablet, Rfl: 1   metoCLOPramide (REGLAN) 10 MG tablet, Take 1 tablet (10 mg total) by mouth every 6 (six) hours., Disp: 15 tablet, Rfl: 0   Multiple Vitamins-Minerals (MULTIVITAMIN WITH MINERALS) tablet, Take 1 tablet by mouth daily. (Patient not taking: Reported on 07/15/2023), Disp: , Rfl:    ondansetron (ZOFRAN) 4 MG tablet, Take 1 tablet (4 mg total) by mouth every 8 (eight) hours as needed for nausea or vomiting., Disp: 30 tablet, Rfl: 0   Allergies  Allergen Reactions   Ativan [Lorazepam] Other (See Comments)    Disorientated, combative   Tape Rash    Clear adhesive tape    Past Medical History:  Diagnosis Date   Descending thoracic dissection (HCC)    History of anemia    no current med.   Hypertension    states under control with med., has been on med. x 3 yr.   Pancreatitis      Past Surgical History:  Procedure Laterality Date   BIOPSY  09/11/2022   Procedure: BIOPSY;  Surgeon: Meridee Score Netty Starring., MD;  Location: [facility];  Service: Gastroenterology;;   ESOPHAGOGASTRODUODENOSCOPY N/A 06/06/2022   Procedure: ESOPHAGOGASTRODUODENOSCOPY (EGD);  Surgeon: Willis Modena, MD;  Location: [facility];  Service: Gastroenterology;  Laterality: N/A;   ESOPHAGOGASTRODUODENOSCOPY N/A 06/14/2022   Procedure: ESOPHAGOGASTRODUODENOSCOPY (EGD);  Surgeon: Kathi Der, MD;  Location: [facility];  Service:  Gastroenterology;  Laterality: N/A;   ESOPHAGOGASTRODUODENOSCOPY (EGD) WITH PROPOFOL N/A 08/01/2022   Procedure: ESOPHAGOGASTRODUODENOSCOPY (EGD) WITH PROPOFOL;  Surgeon: Vida Rigger, MD;  Location: [facility];  Service: Gastroenterology;  Laterality: N/A;   ESOPHAGOGASTRODUODENOSCOPY (EGD) WITH PROPOFOL N/A 09/11/2022   Procedure: ESOPHAGOGASTRODUODENOSCOPY (EGD) WITH PROPOFOL;  Surgeon: Meridee Score Netty Starring., MD;   Location: [facility];  Service: Gastroenterology;  Laterality: N/A;   EUS N/A 09/11/2022   Procedure: UPPER ENDOSCOPIC ULTRASOUND (EUS) RADIAL;  Surgeon: Lemar Lofty., MD;  Location: [facility];  Service: Gastroenterology;  Laterality: N/A;   FOREIGN BODY REMOVAL Left 02/10/2015   Procedure: ATTEMPTED EXPLANTATION OF BIRTH CONTROL DEVICE LEFT ARM;  Surgeon: Luretha Murphy, MD;  Location: [facility];  Service: General;  Laterality: Left;   FOREIGN BODY REMOVAL Left 05/12/2015   Procedure: REMOVAL OF LEFT ARM BIRTH CONTROL DEVICE;  Surgeon: Luretha Murphy, MD;  Location: [facility];  Service: General;  Laterality: Left;   NEUROLYTIC CELIAC PLEXUS N/A 09/11/2022   Procedure: NEUROLYTIC CELIAC PLEXUS;  Surgeon: Lemar Lofty., MD;  Location: [facility];  Service: Gastroenterology;  Laterality: N/A;   SUBMANDIBULAR GLAND EXCISION Right 05/01/2009    Family History  Problem Relation Age of Onset   Breast cancer Mother 83   Heart disease Mother    Hypertension Mother    Colon cancer Neg Hx    Esophageal cancer Neg Hx    Inflammatory bowel disease Neg Hx    Liver disease Neg Hx    Pancreatic cancer Neg Hx    Rectal cancer Neg Hx    Stomach cancer Neg Hx     Social History   Tobacco Use   Smoking status: Every Day    Current packs/day: 0.12    Average packs/day: 0.1 packs/day for 5.0 years (0.6 ttl pk-yrs)    Types: Cigarettes   Smokeless tobacco: Never   Tobacco comments:    1 pack/week  Vaping Use   Vaping status: Never Used  Substance Use Topics   Alcohol use: Not Currently    Comment: occasionally   Drug use: No    ROS   Objective:   Vitals: BP (!) 144/100 (BP Location: Right Arm)   Pulse (!) 115   Temp 99.1 F (37.3 C) (Oral)   Resp 20   SpO2 94%   Physical Exam Constitutional:      General: She is not in acute distress.    Appearance: Normal appearance. She is well-developed and normal weight. She is not  ill-appearing, toxic-appearing or diaphoretic.  HENT:     Head: Normocephalic and atraumatic.     Right Ear: Tympanic membrane, ear canal and external ear normal. No drainage or tenderness. No middle ear effusion. There is no impacted cerumen. Tympanic membrane is not erythematous or bulging.     Left Ear: Tympanic membrane, ear canal and external ear normal. No drainage or tenderness.  No middle ear effusion. There is no impacted cerumen. Tympanic membrane is not erythematous or bulging.     Nose: Congestion and rhinorrhea present.     Mouth/Throat:     Mouth: Mucous membranes are moist. No oral lesions.     Pharynx: No pharyngeal swelling, oropharyngeal exudate, posterior oropharyngeal erythema or uvula swelling.     Tonsils: No tonsillar exudate or tonsillar abscesses.     Comments: Hoarseness of the voice noted. Eyes:     General: No scleral icterus.       Right eye: No discharge.  Left eye: No discharge.     Extraocular Movements: Extraocular movements intact.     Right eye: Normal extraocular motion.     Left eye: Normal extraocular motion.     Conjunctiva/sclera: Conjunctivae normal.  Cardiovascular:     Rate and Rhythm: Regular rhythm. Tachycardia present.     Heart sounds: Normal heart sounds. No murmur heard.    No friction rub. No gallop.  Pulmonary:     Effort: Pulmonary effort is normal. No respiratory distress.     Breath sounds: No stridor. No wheezing, rhonchi or rales.  Chest:     Chest wall: No tenderness.  Musculoskeletal:     Cervical back: Normal range of motion and neck supple.  Lymphadenopathy:     Cervical: No cervical adenopathy.  Skin:    General: Skin is warm and dry.  Neurological:     General: No focal deficit present.     Mental Status: She is alert and oriented to person, place, and time.  Psychiatric:        Mood and Affect: Mood normal.        Behavior: Behavior normal.     Assessment and Plan :   PDMP not reviewed this  encounter.  1. Acute viral syndrome    Patient is high risk, recommend Paxlovid treatment if she test positive for COVID-19.  Also advised an oral prednisone course given her respiratory symptoms, history of bronchitis and current smoking.  Otherwise, will manage for viral illness such as viral URI, viral syndrome, viral rhinitis, COVID-19. Recommended supportive care. Offered scripts for symptomatic relief. Testing is pending. Counseled patient on potential for adverse effects with medications prescribed/recommended today, ER and return-to-clinic precautions discussed, patient verbalized understanding.     Wallis Bamberg, [place] 09/28/23 1610

## 2023-09-28 NOTE — ED Triage Notes (Signed)
Pt reports chills, cough, voice loss and body aches x 2-3 days. Mucinex, Dayquil and Nyquil gives no relief.

## 2023-09-29 LAB — SARS CORONAVIRUS 2 (TAT 6-24 HRS): SARS Coronavirus 2: NEGATIVE

## 2023-11-27 ENCOUNTER — Other Ambulatory Visit: Payer: Self-pay

## 2023-11-27 ENCOUNTER — Other Ambulatory Visit (HOSPITAL_COMMUNITY): Payer: Self-pay

## 2023-11-27 DIAGNOSIS — I10 Essential (primary) hypertension: Secondary | ICD-10-CM

## 2023-12-01 MED ORDER — CLONIDINE HCL 0.2 MG PO TABS: 0.2000 mg | ORAL_TABLET | Freq: Three times a day (TID) | ORAL | 1 refills | Status: DC

## 2023-12-10 ENCOUNTER — Other Ambulatory Visit: Payer: Self-pay | Admitting: Cardiology

## 2023-12-10 DIAGNOSIS — I10 Essential (primary) hypertension: Secondary | ICD-10-CM

## 2023-12-19 ENCOUNTER — Encounter: Payer: 59 | Admitting: Student

## 2023-12-19 NOTE — Progress Notes (Deleted)
 CC: follow up  HPI:  Ms.Robin Guerrero is a 38 y.o. female living with a history stated below and presents today for ***. Please see problem based assessment and plan for additional details.  Past Medical History:  Diagnosis Date   Descending thoracic dissection (HCC)    History of anemia    no current med.   Hypertension    states under control with med., has been on med. x 3 yr.   Pancreatitis     Current Outpatient Medications on File Prior to Visit  Medication Sig Dispense Refill   acetaminophen  (TYLENOL ) 500 MG tablet Take 1,000 mg by mouth every 6 (six) hours as needed for moderate pain.     amLODipine  (NORVASC ) 10 MG tablet TAKE 1 TABLET BY MOUTH EVERY DAY 30 tablet 0   carvedilol  (COREG ) 12.5 MG tablet Take 1 tablet (12.5 mg total) by mouth 2 (two) times daily. 60 tablet 11   cetirizine  (ZYRTEC  ALLERGY) 10 MG tablet Take 1 tablet (10 mg total) by mouth daily. 30 tablet 0   cloNIDine  (CATAPRES ) 0.2 MG tablet Take 1 tablet (0.2 mg total) by mouth every 8 (eight) hours. 90 tablet 1   fluticasone  (FLONASE ) 50 MCG/ACT nasal spray Place 1 spray into both nostrils daily. 11.1 mL 0   hydrALAZINE  (APRESOLINE ) 100 MG tablet TAKE 1 TABLET BY MOUTH THREE TIMES A DAY 270 tablet 1   ibuprofen  (ADVIL ) 600 MG tablet Take 1 tablet (600 mg total) by mouth every 6 (six) hours as needed. 30 tablet 0   lipase/protease/amylase (CREON ) 36000 UNITS CPEP capsule Take 2 capsules (72,000 Units total) by mouth 3 (three) times daily with meals. May also take 1 capsule (36,000 Units total) as needed (with snacks). 240 capsule 11   loratadine  (CLARITIN ) 10 MG tablet Take 1 tablet (10 mg total) by mouth daily. 30 tablet 2   losartan  (COZAAR ) 100 MG tablet TAKE 1 TABLET BY MOUTH EVERY DAY 90 tablet 1   Multiple Vitamins-Minerals (MULTIVITAMIN WITH MINERALS) tablet Take 1 tablet by mouth daily. (Patient not taking: Reported on 07/15/2023)     ondansetron  (ZOFRAN ) 4 MG tablet Take 1 tablet (4 mg total) by  mouth every 8 (eight) hours as needed for nausea or vomiting. 30 tablet 0   predniSONE  (DELTASONE ) 10 MG tablet Take 3 tablets (30 mg total) by mouth daily with breakfast. 15 tablet 0   promethazine -dextromethorphan (PROMETHAZINE -DM) 6.25-15 MG/5ML syrup Take 5 mLs by mouth 3 (three) times daily as needed for cough. 200 mL 0   No current facility-administered medications on file prior to visit.     Review of Systems: ROS negative except for what is noted on the assessment and plan.  There were no vitals filed for this visit.  Physical Exam: Constitutional: Well appearing, not in acute distress. Cardiovascular: regular rate and rhythm, no m/r/g Pulmonary/Chest: normal work of breathing on room air, lungs clear to auscultation bilaterally Abdominal: soft, non-tender, non-distended  Assessment & Plan:   Resistant hypertension    BP Readings from Last 3 Encounters:  12/26/22 (!) 148/99  12/21/22 (!) 144/97  12/08/22 (!) 110/93  Patient presents with history of resistant hypertension of unknown etiology.  History of descending aortic dissection.  Do not see history of renal ultrasound in chart.  Patient is currently taking amlodipine  10 mg daily, clonidine  0.2 mg 3 times daily, losartan  100 mg daily, and hydralazine  100 mg 3 times daily.  Currently denies headache, chest pain, vision changes. -Add carvedilol  12.5 mg twice daily  H/O aortic dissection Stable on recent CT imaging.  Denies any chest pain today. -Restart carvedilol  12.5 mg twice daily -Follow-up with cardiology   Alcoholic pancreatitis Patient has history of pancreatitis since 2021 with recent hospitalization for acute flare.  Discharge 1/6.  Continues with occasional nausea and vomiting when trying to eat.  She states she was previously on Creon  but this was discontinued due to inconsistent meals and she was placed on Zenpep .  She states this gives her headaches and she is not taking.  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  She is on Protonix  for this.  Gastric varices resolved on recent EGD.  Patient has not had alcohol since May. -CMP -Restart Creon  -Continue Phenergan  for nausea -Follow-up with [facility] in 3 months for ERCP and possible pancreatic stent replacement   Tobacco use Patient has cut back to 6 cigarettes a day. -Prescription for Chantix    Iron deficiency anemia Unsure of the nature of her iron deficiency anemia.  No concerns for overt bleeding.  Recent EGD without visualization of varices.  Denies hemoptysis. -CBC -Iron panel -Ferritin   History of alcohol abuse Patient has abstained from alcohol since May.  She denies any cravings.  She does not feel as though she needs any medical therapy to help with her abstinence.  We discussed the importance of continued abstinence in the setting of her pancreatitis. -Continue to revisit this in case patient needs support in the future  Patient {[id number]::discussed with,seen with} Dr. {[id number]::Tpoopjfd,Z. Hoffman,Mullen,Narendra,Vincent,Guilloud,Lau,Machen}  No problem-specific Assessment & Plan notes found for this encounter.   Robin Sandhoff, MD [facility] Internal Medicine, PGY-1 Phone: [phone] Date 12/19/2023 Time 6:15 AM

## 2024-01-07 ENCOUNTER — Other Ambulatory Visit: Payer: Self-pay | Admitting: Cardiology

## 2024-01-07 DIAGNOSIS — I10 Essential (primary) hypertension: Secondary | ICD-10-CM

## 2024-01-11 ENCOUNTER — Other Ambulatory Visit: Payer: Self-pay | Admitting: Cardiology

## 2024-01-11 DIAGNOSIS — I10 Essential (primary) hypertension: Secondary | ICD-10-CM

## 2024-02-08 ENCOUNTER — Other Ambulatory Visit: Payer: Self-pay | Admitting: Cardiology

## 2024-02-08 DIAGNOSIS — I10 Essential (primary) hypertension: Secondary | ICD-10-CM

## 2024-02-09 MED ORDER — AMLODIPINE BESYLATE 10 MG PO TABS: 10.0000 mg | ORAL_TABLET | Freq: Every day | ORAL | 0 refills | Status: DC

## 2024-10-11 ENCOUNTER — Telehealth: Payer: Self-pay | Admitting: *Deleted

## 2024-10-11 NOTE — Telephone Encounter (Signed)
 Pt overdue for visit Attempted to contact pt with an appt No answer/unable to leave message

## 2024-12-23 ENCOUNTER — Other Ambulatory Visit: Payer: Self-pay

## 2024-12-23 DIAGNOSIS — I71019 Dissection of thoracic aorta, unspecified: Secondary | ICD-10-CM

## 2024-12-24 ENCOUNTER — Ambulatory Visit: Payer: Self-pay

## 2024-12-24 VITALS — BP 131/86 | HR 117 | Temp 97.8°F | Wt 251.6 lb

## 2024-12-24 DIAGNOSIS — I10 Essential (primary) hypertension: Secondary | ICD-10-CM

## 2024-12-24 DIAGNOSIS — K861 Other chronic pancreatitis: Secondary | ICD-10-CM

## 2024-12-24 DIAGNOSIS — R0609 Other forms of dyspnea: Secondary | ICD-10-CM | POA: Insufficient documentation

## 2024-12-24 DIAGNOSIS — R2231 Localized swelling, mass and lump, right upper limb: Secondary | ICD-10-CM | POA: Diagnosis not present

## 2024-12-24 DIAGNOSIS — K860 Alcohol-induced chronic pancreatitis: Secondary | ICD-10-CM

## 2024-12-24 DIAGNOSIS — Z720 Tobacco use: Secondary | ICD-10-CM

## 2024-12-24 DIAGNOSIS — R0602 Shortness of breath: Secondary | ICD-10-CM | POA: Diagnosis not present

## 2024-12-24 DIAGNOSIS — F17200 Nicotine dependence, unspecified, uncomplicated: Secondary | ICD-10-CM

## 2024-12-24 DIAGNOSIS — Z8679 Personal history of other diseases of the circulatory system: Secondary | ICD-10-CM

## 2024-12-24 DIAGNOSIS — Z79899 Other long term (current) drug therapy: Secondary | ICD-10-CM

## 2024-12-24 DIAGNOSIS — D508 Other iron deficiency anemias: Secondary | ICD-10-CM

## 2024-12-24 DIAGNOSIS — F17210 Nicotine dependence, cigarettes, uncomplicated: Secondary | ICD-10-CM

## 2024-12-24 MED ORDER — DM-GUAIFENESIN ER 30-600 MG PO TB12: 1.0000 | ORAL_TABLET | Freq: Two times a day (BID) | ORAL | 0 refills | Status: AC | PRN

## 2024-12-24 MED ORDER — HYDRALAZINE HCL 100 MG PO TABS: 100.0000 mg | ORAL_TABLET | Freq: Three times a day (TID) | ORAL | 11 refills | Status: AC

## 2024-12-24 MED ORDER — CARVEDILOL 12.5 MG PO TABS: 12.5000 mg | ORAL_TABLET | Freq: Two times a day (BID) | ORAL | 11 refills | Status: AC

## 2024-12-24 MED ORDER — AMLODIPINE BESYLATE 10 MG PO TABS: 10.0000 mg | ORAL_TABLET | Freq: Every day | ORAL | 3 refills | Status: AC

## 2024-12-24 MED ORDER — PANCRELIPASE (LIP-PROT-AMYL) 36000-114000 UNITS PO CPEP: ORAL_CAPSULE | ORAL | 11 refills | Status: AC

## 2024-12-24 MED ORDER — FUROSEMIDE 20 MG PO TABS: 20.0000 mg | ORAL_TABLET | Freq: Every day | ORAL | 0 refills | Status: DC

## 2024-12-24 MED ORDER — LOSARTAN POTASSIUM 100 MG PO TABS: 100.0000 mg | ORAL_TABLET | Freq: Every day | ORAL | 11 refills | Status: AC

## 2024-12-24 MED ORDER — CLONIDINE HCL 0.2 MG PO TABS: 0.2000 mg | ORAL_TABLET | Freq: Three times a day (TID) | ORAL | 11 refills | Status: AC

## 2024-12-24 MED ORDER — CETIRIZINE HCL 10 MG PO TABS: 10.0000 mg | ORAL_TABLET | Freq: Once | ORAL | 0 refills | Status: DC | PRN

## 2024-12-24 MED ORDER — ONDANSETRON HCL 4 MG PO TABS: 4.0000 mg | ORAL_TABLET | Freq: Three times a day (TID) | ORAL | 0 refills | Status: AC | PRN

## 2024-12-24 MED ORDER — ALBUTEROL SULFATE HFA 108 (90 BASE) MCG/ACT IN AERS: 2.0000 | INHALATION_SPRAY | Freq: Four times a day (QID) | RESPIRATORY_TRACT | 2 refills | Status: AC | PRN

## 2024-12-24 NOTE — Assessment & Plan Note (Signed)
 History of prolonged interruption of chronic medications due to insurance loss, now restarting multiple essential therapies.  Plan: Reconcile medication list Restart indicated chronic medications Send refills to preferred pharmacy Review dosing and adherence Close follow-up to monitor response and side effects

## 2024-12-24 NOTE — Assessment & Plan Note (Signed)
 Chronic, painless, enlarging right axillary mass present for years. No signs of infection. Given progression and lack of prior imaging, malignancy cannot be excluded.  Plan: Order targeted right axillary ultrasound Refer to breast/surgical specialist pending results

## 2024-12-24 NOTE — Assessment & Plan Note (Signed)
 Active daily smoking contributing to respiratory and cardiovascular risk.  Plan: Smoking cessation counseling Discuss pharmacologic cessation options at follow-up

## 2024-12-24 NOTE — Assessment & Plan Note (Addendum)
 The patient reports progressively worsening shortness of breath over the past 1-2 months, now occurring with minimal exertion and significantly limiting mobility, requiring wheelchair use. Symptoms are accompanied by chronic productive cough, wheezing, and chest tightness. Physical examination demonstrates tachycardia, bilateral wheezing, mild right basilar crackles, and bilateral 2+ lower-extremity pitting edema. Given her history of tobacco use, prolonged interruption of antihypertensive therapy, and prior descending thoracic aortic dissection, the presentation raises concern for a multifactorial etiology, including obstructive lung disease such as COPD or chronic bronchitis, as well as possible cardiac dysfunction with volume overload. The absence of fever, hypoxia, or focal infectious findings makes acute pneumonia less likely at this time.oxygen saturation remaining 98% on room air at rest and with ambulation.  Plan: Start furosemide  (Lasix ) 20 mg daily for suspected volume overload and reevaluate in 1 week Order Pro-BNP to assess for cardiac strain Refer to Cardiology; anticipate echocardiogram Refer to Pulmonology for pulmonary function testing Start albuterol  inhaler PRN for wheezing and dyspnea Start guaifenesin /dextromethorphan for cough and mucus clearance Counsel on smoking cessation Monitor renal function and electrolytes ER precautions for worsening dyspnea, chest pain, syncope, or hypoxia

## 2024-12-24 NOTE — Progress Notes (Signed)
 "  CC: Shortness of breath, medication refill, and lump under right arm  HPI:  Ms.Robin Guerrero is a 39 y.o. female living with a history of descending thoracic aortic dissection, hypertension, chronic pancreatitis, and prior anemia who presents today to re-establish care and for medication refills after approximately one year without consistent medical care due to loss of insurance. She reports that she has recently regained insurance coverage and is seeking to restart her chronic medications.  She additionally reports progressive shortness of breath over the past 1-2 months, now occurring with minimal exertion such as walking short distances. This is associated with chronic cough productive of thick yellow-white sputum and intermittent wheezing. She denies fever, chills, or acute infectious symptoms. She has a history of daily tobacco use and has never undergone pulmonary function testing. She reports functional limitation due to dyspnea.  She also reports a chronic, painless lump in the right axilla that has been present for several years and has gradually increased in size. She denies redness, warmth, drainage, or systemic symptoms. She has not previously undergone breast imaging due to lack of insurance.  She denies chest pain, syncope, focal neurologic deficits, or acute abdominal pain at this time. Please see problem-based assessment and plan for additional details.   Please see problem based assessment and plan for additional details.  Past Medical History:  Diagnosis Date   Descending thoracic dissection (HCC)    History of anemia    no current med.   Hypertension    states under control with med., has been on med. x 3 yr.   Pancreatitis     Medications Ordered Prior to Encounter[1]  Family History  Problem Relation Age of Onset   Breast cancer Mother 85   Heart disease Mother    Hypertension Mother    Colon cancer Neg Hx    Esophageal cancer Neg Hx    Inflammatory  bowel disease Neg Hx    Liver disease Neg Hx    Pancreatic cancer Neg Hx    Rectal cancer Neg Hx    Stomach cancer Neg Hx     Social History   Socioeconomic History   Marital status: Single    Spouse name: Not on file   Number of children: 2   Years of education: Not on file   Highest education level: Not on file  Occupational History   Not on file  Tobacco Use   Smoking status: Every Day    Current packs/day: 0.12    Average packs/day: 0.1 packs/day for 5.0 years (0.6 ttl pk-yrs)    Types: Cigarettes   Smokeless tobacco: Never   Tobacco comments:    1 pack/week  Vaping Use   Vaping status: Never Used  Substance and Sexual Activity   Alcohol use: Not Currently    Comment: occasionally   Drug use: No   Sexual activity: Not Currently  Other Topics Concern   Not on file  Social History Narrative   Not on file   Social Drivers of Health   Tobacco Use: High Risk (09/28/2023)   Patient History    Smoking Tobacco Use: Every Day    Smokeless Tobacco Use: Never    Passive Exposure: Not on file  Financial Resource Strain: Not on file  Food Insecurity: Food Insecurity Present (12/17/2022)   Hunger Vital Sign    Worried About Running Out of Food in the Last Year: Sometimes true    Ran Out of Food in the Last Year: Sometimes true  Transportation Needs: No Transportation Needs (12/17/2022)   PRAPARE - Administrator, Civil Service (Medical): No    Lack of Transportation (Non-Medical): No  Physical Activity: Not on file  Stress: Not on file  Social Connections: Not on file  Intimate Partner Violence: Not At Risk (12/17/2022)   Humiliation, Afraid, Rape, and Kick questionnaire    Fear of Current or Ex-Partner: No    Emotionally Abused: No    Physically Abused: No    Sexually Abused: No  Depression (PHQ2-9): Low Risk (02/10/2023)   Depression (PHQ2-9)    PHQ-2 Score: 1  Recent Concern: Depression (PHQ2-9) - Medium Risk (12/26/2022)   Depression (PHQ2-9)    PHQ-2  Score: 8  Alcohol Screen: Not on file  Housing: Low Risk (12/17/2022)   Housing    Last Housing Risk Score: 0  Utilities: At Risk (12/17/2022)   AHC Utilities    Threatened with loss of utilities: Yes  Health Literacy: Not on file    Review of Systems: ROS negative except for what is noted on the assessment and plan.  Vitals:   12/24/24 1043 12/24/24 1128 12/24/24 1136  BP: 131/86    Pulse: 97  (!) 117  Temp: 97.8 F (36.6 C)    TempSrc: Oral    SpO2: 100% 98% 98%  Weight: 251 lb 9.6 oz (114.1 kg)      Physical Exam  Physical Exam: Constitutional: well-appearing adult female sitting in a wheelchair, in no acute distress Constitutional: well-appearing adult female sitting in a wheelchair, in no acute distress HENT: normocephalic, atraumatic, mucous membranes moist Eyes: conjunctiva non-erythematous Neck: supple, no JVD Cardiovascular: tachycardic, regular rhythm, no murmurs, rubs, or gallops Pulmonary/Chest: mildly increased work of breathing on room air; bilateral wheezing; mild crackles at the right lung base Abdominal: soft, non-tender, non-distended MSK: 2+ pitting edema of bilateral lower extremities; no gross deformities Neurological: alert and oriented 3; strength grossly intact; limited ambulation due to dyspnea Skin: warm and dry Psych: appropriate mood and affect; cooperative oxygen saturation remaining 98% on room air at rest and with ambulation Assessment & Plan:   Axillary mass, right Chronic, painless, enlarging right axillary mass present for years. No signs of infection. Given progression and lack of prior imaging, malignancy cannot be excluded.  Plan: Order targeted right axillary ultrasound Refer to breast/surgical specialist pending results  Shortness of breath The patient reports progressively worsening shortness of breath over the past 1-2 months, now occurring with minimal exertion and significantly limiting mobility, requiring wheelchair use.  Symptoms are accompanied by chronic productive cough, wheezing, and chest tightness. Physical examination demonstrates tachycardia, bilateral wheezing, mild right basilar crackles, and bilateral 2+ lower-extremity pitting edema. Given her history of tobacco use, prolonged interruption of antihypertensive therapy, and prior descending thoracic aortic dissection, the presentation raises concern for a multifactorial etiology, including obstructive lung disease such as COPD or chronic bronchitis, as well as possible cardiac dysfunction with volume overload. The absence of fever, hypoxia, or focal infectious findings makes acute pneumonia less likely at this time.oxygen saturation remaining 98% on room air at rest and with ambulation.  Plan: Start furosemide  (Lasix ) 20 mg daily for suspected volume overload and reevaluate in 1 week Order Pro-BNP to assess for cardiac strain Refer to Cardiology; anticipate echocardiogram Refer to Pulmonology for pulmonary function testing Start albuterol  inhaler PRN for wheezing and dyspnea Start guaifenesin /dextromethorphan for cough and mucus clearance Counsel on smoking cessation Monitor renal function and electrolytes ER precautions for worsening dyspnea, chest pain, syncope,  or hypoxia  Hypertension Long-standing hypertension previously controlled on multiple agents, off medications for ~1 year due to insurance loss. Currently normotensive but high-risk due to aortic dissection history.  Plan: Resume prior antihypertensive regimen Monitor blood pressure closely Reinforce medication adherence Adjust therapy at follow-up as needed  History of aortic dissection Chronic condition without acute chest pain or neurologic symptoms. Requires strict blood pressure and heart rate control to prevent progression.  Plan: Maintain aggressive BP control Cardiology follow-up as above Avoid sudden BP fluctuations Avoid NSAIDs if renal function worsens  Pancreatitis,  chronic (HCC) History of chronic pancreatitis with dependence on pancreatic enzyme replacement and antiemetics. No evidence of acute flare today.  Plan: Restart Creon  with meals and snacks Continue ondansetron  PRN Avoid alcohol Hold NSAIDs pending renal labs  Tobacco use Active daily smoking contributing to respiratory and cardiovascular risk.  Plan: Smoking cessation counseling Discuss pharmacologic cessation options at follow-up  Medication management History of prolonged interruption of chronic medications due to insurance loss, now restarting multiple essential therapies.  Plan: Reconcile medication list Restart indicated chronic medications Send refills to preferred pharmacy Review dosing and adherence Close follow-up to monitor response and side effects   Patient discussed with Dr. Trudy Armando Rossetti M.D [facility] Internal Medicine, PGY-1 Phone: [phone] Date 12/24/2024 Time 4:22 PM     [1]  Current Outpatient Medications on File Prior to Visit  Medication Sig Dispense Refill   acetaminophen  (TYLENOL ) 500 MG tablet Take 1,000 mg by mouth every 6 (six) hours as needed for moderate pain.     ibuprofen  (ADVIL ) 600 MG tablet Take 1 tablet (600 mg total) by mouth every 6 (six) hours as needed. 30 tablet 0   predniSONE  (DELTASONE ) 10 MG tablet Take 3 tablets (30 mg total) by mouth daily with breakfast. 15 tablet 0   No current facility-administered medications on file prior to visit.   "

## 2024-12-24 NOTE — Assessment & Plan Note (Signed)
" >>  ASSESSMENT AND PLAN FOR HISTORY OF AORTIC DISSECTION WRITTEN ON 12/24/2024  4:15 PM BY Cian Costanzo, MD  Chronic condition without acute chest pain or neurologic symptoms. Requires strict blood pressure and heart rate control to prevent progression.  Plan: Maintain aggressive BP control Cardiology follow-up as above Avoid sudden BP fluctuations Avoid NSAIDs if renal function worsens "

## 2024-12-24 NOTE — Assessment & Plan Note (Signed)
 Chronic condition without acute chest pain or neurologic symptoms. Requires strict blood pressure and heart rate control to prevent progression.  Plan: Maintain aggressive BP control Cardiology follow-up as above Avoid sudden BP fluctuations Avoid NSAIDs if renal function worsens

## 2024-12-24 NOTE — Assessment & Plan Note (Addendum)
 History of chronic pancreatitis with dependence on pancreatic enzyme replacement and antiemetics. No evidence of acute flare today.  Plan: Restart Creon  with meals and snacks Continue ondansetron  PRN Avoid alcohol Hold NSAIDs pending renal labs

## 2024-12-24 NOTE — Assessment & Plan Note (Signed)
 Long-standing hypertension previously controlled on multiple agents, off medications for ~1 year due to insurance loss. Currently normotensive but high-risk due to aortic dissection history.  Plan: Resume prior antihypertensive regimen Monitor blood pressure closely Reinforce medication adherence Adjust therapy at follow-up as needed

## 2024-12-24 NOTE — Patient Instructions (Addendum)
 Thank you, Ms.Robin Guerrero for allowing us  to provide your care today.  Today we discussed refilling your medications, and all of your necessary prescriptions were sent to the [facility] pharmacy. Please take all medications exactly as prescribed. We also reviewed the blood tests that you need, including labs to check your kidney function, electrolytes, liver, heart, blood sugar (diabetes), and cholesterol. You shared that you have been experiencing shortness of breath that started about one month ago. Based on your symptoms, we are concerned about two possible causes: lung disease such as COPD and a possible issue with how your heart is pumping. To better understand this, we placed referrals for you to see both a lung (pulmonology) doctor and a heart (cardiology) doctor.  Because you have signs that may suggest extra fluid in your body, we started a water pill (Lasix ). Please take Lasix  20 mg once daily as prescribed. We would like you to return to the clinic in one week so we can reassess your symptoms and repeat blood tests. We also prescribed an inhaler to help with your breathing and a medication to help with your cough.  In addition, you mentioned a lump under your right arm (right axilla). An ultrasound has been ordered to evaluate this further, and you should receive a call to schedule that test.  Please contact us  if your shortness of breath worsens, if you develop chest pain, or if you have any concerns before your follow-up visit.  I have ordered the following labs for you:  Lab Orders         Comprehensive metabolic panel with GFR         CBC with Diff         Lipid Profile         Brain natriuretic peptide         Pro b natriuretic peptide (BNP)         Hemoglobin A1c      T Referrals ordered today:   Referral Orders         Ambulatory referral to Pulmonology         Ambulatory referral to Cardiology      I have ordered the following medication/changed the following medications:    Stop the following medications: Medications Discontinued During This Encounter  Medication Reason   fluticasone  (FLONASE ) 50 MCG/ACT nasal spray Completed Course   loratadine  (CLARITIN ) 10 MG tablet Completed Course   Multiple Vitamins-Minerals (MULTIVITAMIN WITH MINERALS) tablet Completed Course   promethazine -dextromethorphan (PROMETHAZINE -DM) 6.25-15 MG/5ML syrup Completed Course   carvedilol  (COREG ) 12.5 MG tablet Reorder   lipase/protease/amylase (CREON ) 36000 UNITS CPEP capsule Reorder   ondansetron  (ZOFRAN ) 4 MG tablet Reorder   losartan  (COZAAR ) 100 MG tablet Reorder   cetirizine  (ZYRTEC  ALLERGY) 10 MG tablet Reorder   cloNIDine  (CATAPRES ) 0.2 MG tablet Reorder   hydrALAZINE  (APRESOLINE ) 100 MG tablet Reorder   amLODipine  (NORVASC ) 10 MG tablet Reorder     Start the following medications: Meds ordered this encounter  Medications   amLODipine  (NORVASC ) 10 MG tablet    Sig: Take 1 tablet (10 mg total) by mouth daily.    Dispense:  90 tablet    Refill:  3    Please call our office to schedule an overdue appointment with Dr. Elmira before anymore refills. [phone]. Thank you 3rd and Final attempt   carvedilol  (COREG ) 12.5 MG tablet    Sig: Take 1 tablet (12.5 mg total) by mouth 2 (two) times daily.    Dispense:  60 tablet    Refill:  11   cetirizine  (ZYRTEC  ALLERGY) 10 MG tablet    Sig: Take 1 tablet (10 mg total) by mouth once as needed for allergies.    Dispense:  30 tablet    Refill:  0   cloNIDine  (CATAPRES ) 0.2 MG tablet    Sig: Take 1 tablet (0.2 mg total) by mouth every 8 (eight) hours.    Dispense:  90 tablet    Refill:  11   hydrALAZINE  (APRESOLINE ) 100 MG tablet    Sig: Take 1 tablet (100 mg total) by mouth 3 (three) times daily.    Dispense:  90 tablet    Refill:  11    Please call our office to schedule an overdue appointment with Dr. Elmira before anymore refills. [phone]. Thank you 3rd and Final attempt   lipase/protease/amylase (CREON )  36000 UNITS CPEP capsule    Sig: Take 2 capsules (72,000 Units total) by mouth 3 (three) times daily with meals. May also take 1 capsule (36,000 Units total) as needed (with snacks).    Dispense:  240 capsule    Refill:  11   losartan  (COZAAR ) 100 MG tablet    Sig: Take 1 tablet (100 mg total) by mouth daily.    Dispense:  90 tablet    Refill:  11   ondansetron  (ZOFRAN ) 4 MG tablet    Sig: Take 1 tablet (4 mg total) by mouth every 8 (eight) hours as needed for nausea or vomiting.    Dispense:  30 tablet    Refill:  0   dextromethorphan-guaiFENesin  (MUCINEX  DM) 30-600 MG 12hr tablet    Sig: Take 1 tablet by mouth 2 (two) times daily as needed for cough.    Dispense:  30 tablet    Refill:  0   albuterol  (VENTOLIN  HFA) 108 (90 Base) MCG/ACT inhaler    Sig: Inhale 2 puffs into the lungs every 6 (six) hours as needed for wheezing or shortness of breath.    Dispense:  8 g    Refill:  2   furosemide  (LASIX ) 20 MG tablet    Sig: Take 1 tablet (20 mg total) by mouth daily.    Dispense:  10 tablet    Refill:  0     Follow up: 1 week   Remember:   Should you have any questions or concerns please call the internal medicine clinic at [phone].    Robin Guerrero, M.D [facility]

## 2024-12-25 LAB — CBC WITH DIFFERENTIAL/PLATELET
Basophils Absolute: 0 x10E3/uL (ref 0.0–0.2)
Basos: 0 %
EOS (ABSOLUTE): 0.1 x10E3/uL (ref 0.0–0.4)
Eos: 1 %
Hematocrit: 29.6 % — ABNORMAL LOW (ref 34.0–46.6)
Hemoglobin: 7.8 g/dL — ABNORMAL LOW (ref 11.1–15.9)
Immature Grans (Abs): 0 x10E3/uL (ref 0.0–0.1)
Immature Granulocytes: 0 %
Lymphocytes Absolute: 1 x10E3/uL (ref 0.7–3.1)
Lymphs: 17 %
MCH: 20.4 pg — ABNORMAL LOW (ref 26.6–33.0)
MCHC: 26.4 g/dL — ABNORMAL LOW (ref 31.5–35.7)
MCV: 78 fL — ABNORMAL LOW (ref 79–97)
Monocytes Absolute: 0.5 x10E3/uL (ref 0.1–0.9)
Monocytes: 7 %
Neutrophils Absolute: 4.6 x10E3/uL (ref 1.4–7.0)
Neutrophils: 75 %
Platelets: 172 x10E3/uL (ref 150–450)
RBC: 3.82 x10E6/uL (ref 3.77–5.28)
RDW: 19.8 % — ABNORMAL HIGH (ref 11.7–15.4)
WBC: 6.2 x10E3/uL (ref 3.4–10.8)

## 2024-12-25 LAB — COMPREHENSIVE METABOLIC PANEL WITH GFR
ALT: 18 IU/L (ref 0–32)
AST: 21 IU/L (ref 0–40)
Albumin: 3.7 g/dL — ABNORMAL LOW (ref 3.9–4.9)
Alkaline Phosphatase: 70 IU/L (ref 41–116)
BUN/Creatinine Ratio: 13 (ref 9–23)
BUN: 12 mg/dL (ref 6–20)
Bilirubin Total: 1 mg/dL (ref 0.0–1.2)
CO2: 19 mmol/L — ABNORMAL LOW (ref 20–29)
Calcium: 8.7 mg/dL (ref 8.7–10.2)
Chloride: 104 mmol/L (ref 96–106)
Creatinine, Ser: 0.89 mg/dL (ref 0.57–1.00)
Globulin, Total: 2.7 g/dL (ref 1.5–4.5)
Glucose: 146 mg/dL — ABNORMAL HIGH (ref 70–99)
Potassium: 4 mmol/L (ref 3.5–5.2)
Sodium: 137 mmol/L (ref 134–144)
Total Protein: 6.4 g/dL (ref 6.0–8.5)
eGFR: 85 mL/min/1.73

## 2024-12-25 LAB — LIPID PANEL
Chol/HDL Ratio: 3.8 ratio (ref 0.0–4.4)
Cholesterol, Total: 90 mg/dL — ABNORMAL LOW (ref 100–199)
HDL: 24 mg/dL — ABNORMAL LOW
LDL Chol Calc (NIH): 45 mg/dL (ref 0–99)
Triglycerides: 114 mg/dL (ref 0–149)
VLDL Cholesterol Cal: 21 mg/dL (ref 5–40)

## 2024-12-25 LAB — PRO B NATRIURETIC PEPTIDE: NT-Pro BNP: 780 pg/mL — ABNORMAL HIGH (ref 0–130)

## 2024-12-25 LAB — HEMOGLOBIN A1C
Est. average glucose Bld gHb Est-mCnc: 117 mg/dL
Hgb A1c MFr Bld: 5.7 % — ABNORMAL HIGH (ref 4.8–5.6)

## 2024-12-27 ENCOUNTER — Telehealth: Payer: Self-pay | Admitting: *Deleted

## 2024-12-27 ENCOUNTER — Other Ambulatory Visit: Payer: Self-pay | Admitting: Internal Medicine

## 2024-12-27 ENCOUNTER — Ambulatory Visit: Payer: Self-pay

## 2024-12-27 DIAGNOSIS — R2231 Localized swelling, mass and lump, right upper limb: Secondary | ICD-10-CM

## 2024-12-27 DIAGNOSIS — D508 Other iron deficiency anemias: Secondary | ICD-10-CM

## 2024-12-27 NOTE — Telephone Encounter (Signed)
 Will forward to [employer].                   Copied from CRM [id number]. Topic: Clinical - Lab/Test Results >> Dec 27, 2024 10:42 AM Diannia H wrote: Reason for CRM: Patient is calling about her lab results. They have no been reviewed and noted for me to read to the patient. Could you assist? Patients callback number is [phone].

## 2024-12-27 NOTE — Progress Notes (Signed)
 Hello Robin Guerrero,  I reviewed your recent blood work. Your BNP level is mildly elevated, which can be seen with fluid retention or strain on the heart. Since you are currently taking Lasix , we will continue this medication and reassess your symptoms and labs at your upcoming visit in a few days.  Your Hemoglobin A1c is 5.7, which falls in the prediabetes range. At this time, this does not require medication, but we will continue to monitor it and discuss lifestyle measures at your follow-up.  Your labs also show anemia (a low blood count), which can sometimes contribute to fatigue or shortness of breath. To better understand the cause, we will be checking iron studies at your next visit.  Please continue your current medications as prescribed, and let us  know if you develop worsening shortness of breath, leg swelling, chest pain, or any new concerns before your appointment.  We look forward to seeing you soon. Take care

## 2024-12-29 LAB — SPECIMEN STATUS REPORT

## 2024-12-29 LAB — IRON AND TIBC
Iron Saturation: 3 % — CL (ref 15–55)
Iron: 15 ug/dL — ABNORMAL LOW (ref 27–159)
Total Iron Binding Capacity: 484 ug/dL — ABNORMAL HIGH (ref 250–450)
UIBC: 469 ug/dL — ABNORMAL HIGH (ref 131–425)

## 2024-12-29 LAB — FERRITIN: Ferritin: 25 ng/mL (ref 15–150)

## 2024-12-31 ENCOUNTER — Encounter: Payer: Self-pay | Admitting: Student

## 2024-12-31 ENCOUNTER — Other Ambulatory Visit: Payer: Self-pay | Admitting: Student

## 2024-12-31 ENCOUNTER — Ambulatory Visit: Admitting: Student

## 2024-12-31 ENCOUNTER — Telehealth: Payer: Self-pay

## 2024-12-31 ENCOUNTER — Telehealth (HOSPITAL_COMMUNITY): Payer: Self-pay | Admitting: Internal Medicine

## 2024-12-31 ENCOUNTER — Other Ambulatory Visit (HOSPITAL_COMMUNITY): Payer: Self-pay

## 2024-12-31 ENCOUNTER — Encounter: Payer: Self-pay | Admitting: Internal Medicine

## 2024-12-31 ENCOUNTER — Other Ambulatory Visit: Payer: Self-pay

## 2024-12-31 VITALS — BP 115/70 | HR 102 | Temp 97.9°F | Ht 68.0 in | Wt 242.6 lb

## 2024-12-31 DIAGNOSIS — D508 Other iron deficiency anemias: Secondary | ICD-10-CM

## 2024-12-31 DIAGNOSIS — D509 Iron deficiency anemia, unspecified: Secondary | ICD-10-CM | POA: Diagnosis not present

## 2024-12-31 DIAGNOSIS — K860 Alcohol-induced chronic pancreatitis: Secondary | ICD-10-CM

## 2024-12-31 DIAGNOSIS — K863 Pseudocyst of pancreas: Secondary | ICD-10-CM

## 2024-12-31 DIAGNOSIS — G4733 Obstructive sleep apnea (adult) (pediatric): Secondary | ICD-10-CM | POA: Diagnosis not present

## 2024-12-31 DIAGNOSIS — R0602 Shortness of breath: Secondary | ICD-10-CM

## 2024-12-31 DIAGNOSIS — R2231 Localized swelling, mass and lump, right upper limb: Secondary | ICD-10-CM

## 2024-12-31 DIAGNOSIS — Z8679 Personal history of other diseases of the circulatory system: Secondary | ICD-10-CM

## 2024-12-31 DIAGNOSIS — Z720 Tobacco use: Secondary | ICD-10-CM

## 2024-12-31 DIAGNOSIS — I10 Essential (primary) hypertension: Secondary | ICD-10-CM

## 2024-12-31 DIAGNOSIS — I71012 Dissection of descending thoracic aorta: Secondary | ICD-10-CM

## 2024-12-31 DIAGNOSIS — Z79899 Other long term (current) drug therapy: Secondary | ICD-10-CM

## 2024-12-31 DIAGNOSIS — R0683 Snoring: Secondary | ICD-10-CM | POA: Diagnosis not present

## 2024-12-31 DIAGNOSIS — F17200 Nicotine dependence, unspecified, uncomplicated: Secondary | ICD-10-CM

## 2024-12-31 LAB — BASIC METABOLIC PANEL WITH GFR
Anion gap: 10 (ref 5–15)
BUN: 9 mg/dL (ref 6–20)
CO2: 24 mmol/L (ref 22–32)
Calcium: 8.7 mg/dL — ABNORMAL LOW (ref 8.9–10.3)
Chloride: 104 mmol/L (ref 98–111)
Creatinine, Ser: 0.83 mg/dL (ref 0.44–1.00)
GFR, Estimated: >60 mL/min
Glucose, Bld: 141 mg/dL — ABNORMAL HIGH (ref 70–99)
Potassium: 3.9 mmol/L (ref 3.5–5.1)
Sodium: 138 mmol/L (ref 135–145)

## 2024-12-31 MED ORDER — FUROSEMIDE 20 MG PO TABS: 40.0000 mg | ORAL_TABLET | Freq: Every day | ORAL | 0 refills | Status: AC

## 2024-12-31 MED ORDER — TRAZODONE HCL 50 MG PO TABS: 50.0000 mg | ORAL_TABLET | Freq: Every day | ORAL | 1 refills | Status: AC

## 2024-12-31 NOTE — Assessment & Plan Note (Addendum)
 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

## 2024-12-31 NOTE — Progress Notes (Addendum)
 "  Established Patient Office Visit  Subjective   Patient ID: Robin Guerrero, female    DOB: March 26, 1986  Age: 39 y.o. MRN: [id number]  Chief Complaint  Patient presents with   Follow-up    1 WK    Depression   Hypertension    [name] is a 39 y.o. female with past medical history of hypertension, history of known Type B descending thoracic aortic dissection, pancreatitis, GERD, history of alcohol abuse, IDA presents today to follow-up on labs that were done on 12/24/2024, at the last office visit.  Review of Systems:  As per assessment and Plan   Objective:     Vitals:   12/31/24 1036  BP: 115/70  Pulse: (!) 102  Temp: 97.9 F (36.6 C)  TempSrc: Oral  SpO2: 100%  Weight: 242 lb 9.6 oz (110 kg)  Height: 5' 8 (1.727 m)    Physical Exam General: Sitting in chair, no acute distress Cardiovascular: Regular rate,  Pulmonary: Breathing comfortably, no wheezing or crackles noted bilaterally Abdomen: Soft, nontender, nondistended MSK: Trace bilateral lower extremity pitting edema, +1 ankle edema   Last metabolic panel Lab Results  Component Value Date   GLUCOSE 146 (H) 12/24/2024   NA 137 12/24/2024   K 4.0 12/24/2024   CL 104 12/24/2024   CO2 19 (L) 12/24/2024   BUN 12 12/24/2024   CREATININE 0.89 12/24/2024   EGFR 85 12/24/2024   CALCIUM  8.7 12/24/2024   PHOS 2.8 10/31/2022   PROT 6.4 12/24/2024   ALBUMIN 3.7 (L) 12/24/2024   LABGLOB 2.7 12/24/2024   AGRATIO 1.3 12/26/2022   BILITOT 1.0 12/24/2024   ALKPHOS 70 12/24/2024   AST 21 12/24/2024   ALT 18 12/24/2024   ANIONGAP 9 12/21/2022      The ASCVD Risk score (Arnett DK, et al., 2019) failed to calculate for the following reasons:   The 2019 ASCVD risk score is only valid for ages 3 to 33    Assessment & Plan:   Patient discussed with Dr. Lovie  Problem List Items Addressed This Visit       Cardiovascular and Mediastinum   Descending thoracic aortic dissection [facility])   Patient is  scheduled for surveillance CT angio chest abdomen pelvis for dissection on 01/11/2025 at 1445 PM        Respiratory   OSA (obstructive sleep apnea)   Reports symptoms of insomnia, trouble initiating and sleep maintenance.  Reports that she has tried over-the-counter melatonin and Tylenol  PM however she still has hard time staying asleep.  Reports that she does snore at night.  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         Other   Iron deficiency anemia   At the last OV, patient had ongoing fatigue and SOB.     Component Value Date/Time   IRON 15 (L) 12/24/2024 1153   TIBC 484 (H) 12/24/2024 1153   FERRITIN 25 12/24/2024 1153   IRONPCTSAT 3 (LL) 12/24/2024 1153  - Referral for IV iron therapy is placed  - RTC in 6-8 weeks to repeat iron studies       Relevant  Orders   Amb Referral to Intravenous Iron Therapy   Shortness of breath - Primary   Reports that SOB has improved and LE edema has improved as well as being on Lasix  20 mg daily. BNP 780; Weight at the last office visit 251 pounds, weight today is 242 pounds, patient lost 9 pounds.  On exam today, trace bilateral lower extremity edema with 1+ pitting edema of the ankles.  No crackles auscultated on lung exam. - Repeat BMP to evaluate serum creatinine - Echo is scheduled to evaluate for cardiac etiology      Relevant Orders   ECHOCARDIOGRAM COMPLETE   Basic metabolic panel with GFR   Other Visit Diagnoses       Snores           ADDENDUM: BMP showed Scr 0.83, GFR >60, K 3.9  Patient was advised to increase lasix  to 40 mg for 7 days and reach out to clinic next week  Friday with weight.   Return Depends, based on lab result.    Toma Edwards, DO "

## 2024-12-31 NOTE — Assessment & Plan Note (Signed)
 Reports symptoms of insomnia, trouble initiating and sleep maintenance.  Reports that she has tried over-the-counter melatonin and Tylenol  PM however she still has hard time staying asleep.  Reports that she does snore at night.  STOP-BANG score equals to 4 points, high risk for OSA -Referral to sleep medicine -Trazodone  50 mg daily at bedtime

## 2024-12-31 NOTE — Assessment & Plan Note (Signed)
 At the last OV, patient had ongoing fatigue and SOB.     Component Value Date/Time   IRON 15 (L) 12/24/2024 1153   TIBC 484 (H) 12/24/2024 1153   FERRITIN 25 12/24/2024 1153   IRONPCTSAT 3 (LL) 12/24/2024 1153  - Referral for IV iron therapy is placed  - RTC in 6-8 weeks to repeat iron studies

## 2024-12-31 NOTE — Assessment & Plan Note (Signed)
 Patient is scheduled for surveillance CT angio chest abdomen pelvis for dissection on 01/11/2025 at 1445 PM

## 2024-12-31 NOTE — Assessment & Plan Note (Addendum)
 Reports that SOB has improved and LE edema has improved as well as being on Lasix  20 mg daily. BNP 780; Weight at the last office visit 251 pounds, weight today is 242 pounds, patient lost 9 pounds.  On exam today, trace bilateral lower extremity edema with 1+ pitting edema of the ankles.  No crackles auscultated on lung exam. - Repeat BMP to evaluate serum creatinine - Echo is scheduled to evaluate for cardiac etiology

## 2024-12-31 NOTE — Telephone Encounter (Signed)
 Auth Submission: NO AUTH NEEDED Site of care: Site of care: [facility] Payer: [employer] commercial Medication & CPT/J Code(s) submitted: Venofer (Iron Sucrose) J1756 Diagnosis Code:  Route of submission (phone, fax, portal):  Phone # Fax # Auth type: Buy/Bill PB Units/visits requested: 300mg  x 3 doses Reference number:  Approval from: 12/31/24 to 05/15/25

## 2024-12-31 NOTE — Addendum Note (Signed)
 Addended by: HEDDY BARREN on: 12/31/2024 02:39 PM   Modules accepted: Orders

## 2024-12-31 NOTE — Telephone Encounter (Signed)
 Patient referred to infusion pharmacy team for ambulatory infusion of IV iron.  Insurance - Presenter, Broadcasting of care - Site of care: [facility] Dx code - D50.9 IV Iron Therapy - Venofer 300 mg IV  x 3  Infusion appointments - Scheduling team will schedule patient as soon as possible.     Birdie Fetty D. Romel Dumond, PharmD

## 2024-12-31 NOTE — Patient Instructions (Addendum)
 Thank you, Ms.Trenika Gabrielle for allowing us  to provide your care today. Today we discussed:  For your volume: - I will repeat a lab work to make sure your kidneys are ok then I will call you about lasix    For your Anemia - I sent a referral for IV iron therapy   I have ordered the following labs for you:  Lab Orders         Basic metabolic panel with GFR      Referrals ordered today:   Referral Orders         Amb Referral to Intravenous Iron Therapy         Ambulatory referral to Sleep Studies      I have ordered the following medication/changed the following medications:   Stop the following medications: There are no discontinued medications.   Start the following medications: Meds ordered this encounter  Medications   traZODone  (DESYREL ) 50 MG tablet    Sig: Take 1 tablet (50 mg total) by mouth at bedtime.    Dispense:  30 tablet    Refill:  1     Follow up: Depends based on lab result     Remember:   Should you have any questions or concerns please call the internal medicine clinic at [phone].     Dr. Toma [facility]

## 2025-01-03 ENCOUNTER — Ambulatory Visit: Payer: Self-pay | Admitting: Student

## 2025-01-03 ENCOUNTER — Ambulatory Visit: Admitting: Gastroenterology

## 2025-01-03 ENCOUNTER — Telehealth: Payer: Self-pay | Admitting: *Deleted

## 2025-01-03 NOTE — Progress Notes (Unsigned)
 "  Robin Guerrero [id number] 1986/02/14   Chief Complaint:  Referring Provider: Celestina Czar, MD Primary GI MD: Dr. Wilhelmenia  HPI: Robin Guerrero is a 39 y.o. female with past medical history of aortic dissection, anemia, GERD, HTN, OSA, chronic pancreatitis who presents today for a complaint of *** .    Patient with history significant for recurrent acute pancreatitis.  07/30/2022 to 08/02/2022 admitted to hospital after presenting with abdominal pain and hematemesis.  Prior history of alcohol abuse noted but had been sober for a year.  Had EGD June 2023 with varices done by Dr. Elicia with varix noted.  CT of the abdomen and pelvis showed acute pancreatitis involving the pancreatic head as well as multiple small.  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  Underwent EGD with no significant findings.  Patient underwent upper EUS 09/11/2022 by Dr. Wilhelmenia with the following indications: Pancreatic cyst on CT scan, Exclusion of chronic pancreatitis, Acute recurrent pancreatitis, Celiac plexus block for pain secondary to chronic pancreatitis, Hematemesis. At that time was in [place] [facility] patient.  Celiac plexus block done at that time.  09/12/2022 patient seen at [facility] for advanced endoscopy/biliary consultation.  Per note at that time, history of recurrent alcohol associated pancreatitis in 2021.  Recent hospitalization and upper EUS with celiac plexus block was reviewed.  10/28/2022 seen in the ED for nausea, vomiting, intermittent hematemesis since May 2023.  CT showed chronic splenic vein occlusion with stability of dissection and possible acute on chronic pancreatitis.  No cholecystitis.  Discharged home on Reglan .  10/30/2022 presented again to the ED with episode of hematochezia and ongoing abdominal pain and nausea/vomiting.  Given multiple rounds of droperidol , Zofran , Dilaudid .  Tachycardic 120.  Signed out AGAINST MEDICAL ADVICE in the early hours due to childcare issues  but represented later in the morning with the same symptoms.  10/31/2022 to 11/03/2022 admitted to hospital with hypertension, CT chest showing stable aortic dissection type B.  Urine tox positive for opioids and THC.  Regarding her pancreatitis, she has a chronic pancreatic pseudocyst, chronic splenic vein narrowing, lipase mildly elevated at 90 with improving abdominal pain, tolerating diet, was advised to continue Creon  with meals and resume medications.  Seen again by [facility] 11/21/2022.  Plan at that time was to keep pain management appointment which had been scheduled for the following week, trial ERCP with PD stenting to see if pain would improve.  Was also following with hepatology team at that time.  ERCP 12/10/2022 showed normal major papilla, pancreatic duct leak, marked chronic pancreatitis, and a plastic pancreatic stent was placed in the ventral pancreatic duct.  Plan was to repeat ERCP within 3 months to exchange stent.  12/10/2022 seen at [facility] liver clinic for follow-up of portal hypertension.  Plan at that time was to reevaluate for gastric varices at time of next ERCP, no indication to resume beta-blocker therapy given absence of gastric and esophageal varices on most recent endoscopy.  12/16/2022 to 12/21/2022 patient admitted to the hospital after presenting to the ED with nausea and 10/10 abdominal pain.  Workup showed normal lipase but CT A/P showed acute on chronic pancreatitis with possible necrosis involving the body and tail of pancreas.  Admitted with acute on chronic pancreatitis and sepsis secondary to healthcare associated pneumonia.  03/18/2023 patient seen by [facility] for stent follow-up. Plan at that time was to schedule ERCP with stent exchange. Per Dr. Mayra initial consult note:  Possible etiologies of pancreatitis: Biliary: no history of gallstones to our  knowledge;  Alcohol: has not drank in 1 year; previously heavy drinker  Cigarettes: active smoker; was trying to quit;  5-6 cigarettes; when she's trying not to smoke 4 cigarettes; has quit in the past Pharm/Supplements: none  TG: need to check in future for completeness, do not see in her chart ** Prior Trauma: no known history Panc divisum: no documentation on MRI/MRCP  FH: no known history  Last note with GI is a telephone encounter from 06/16/2024.  Appears that multiple attempts were made to contact patient to discuss biliary stent removal but appear she could not be reached.   Discussed the use of AI scribe software for clinical note transcription with the patient, who gave verbal consent to proceed.  History of Present Illness       Previous GI Procedures/Imaging   ERCP 12/10/2022 - The major papilla appeared normal. - A pancreatic duct leak was found. - Marked chronic pancreatitis. - One plastic pancreatic stent was placed into the ventral pancreatic duct. - No specimens collected.   Upper EUS 09/11/2022 EGD impression:  - No gross lesions in esophagus. Z- line irregular, 39 cm from the incisors. - Type 1 isolated gastric varices ( IGV1, varices located in the fundus) , without bleeding.  - Erythematous mucosa in the stomach. Biopsied.  - No gross lesions in the duodenal bulb, in the first portion of the duodenum and in the second portion of the duodenum.  - Normal major papilla hidden under a hood.  EUS impression:  - Pancreatic parenchymal abnormalities consisting of atrophy, diffuse echogenicity, lobularity and hyperechoic strands were noted in the entire pancreas.  - The pancreatic duct was normal in the head, slightly prominent in the neck and body area and normal in the tail. Sidebranch dilation in the body/ tail area was noted.  - A few cystic lesions were seen in the genu of the pancreas and pancreatic body. Tissue has not been obtained. However, the endosonographic appearance is suggestive of a pancreatic pseudocysts.  - There was no sign of significant pathology in the common bile  duct and in the common hepatic duct.  - Small amount of hyperechoic material consistent with sludge was visualized endosonographically in the gallbladder.  - A few enlarged lymph nodes were visualized in the perigastric region and peripancreatic region. Tissue has not been obtained. However, the endosonographic appearance is suggestive of benign inflammatory changes.  - Varices were visualized endosonographically in the cardia of the stomach and in the fundus of the stomach.  - Celiac plexus block performed.  EGD 08/01/2022 (Dr. Rosalie) - Small hiatal hernia.  - Gastric varices, without bleeding.  - Normal duodenal bulb, first portion of the duodenum, second portion of the duodenum and third portion of the duodenum.  - The examination was otherwise normal.  - No specimens collected.  Past Medical History:  Diagnosis Date   Descending thoracic dissection (HCC)    History of anemia    no current med.   Hypertension    states under control with med., has been on med. x 3 yr.   OSA (obstructive sleep apnea) 12/31/2024   Pancreatitis     Past Surgical History:  Procedure Laterality Date   BIOPSY  09/11/2022   Procedure: BIOPSY;  Surgeon: Wilhelmenia Aloha Raddle., MD;  Location: [facility];  Service: Gastroenterology;;   ESOPHAGOGASTRODUODENOSCOPY N/A 06/06/2022   Procedure: ESOPHAGOGASTRODUODENOSCOPY (EGD);  Surgeon: Burnette Fallow, MD;  Location: [facility];  Service: Gastroenterology;  Laterality: N/A;   ESOPHAGOGASTRODUODENOSCOPY N/A 06/14/2022  Procedure: ESOPHAGOGASTRODUODENOSCOPY (EGD);  Surgeon: Elicia Claw, MD;  Location: [facility];  Service: Gastroenterology;  Laterality: N/A;   ESOPHAGOGASTRODUODENOSCOPY (EGD) WITH PROPOFOL  N/A 08/01/2022   Procedure: ESOPHAGOGASTRODUODENOSCOPY (EGD) WITH PROPOFOL ;  Surgeon: Rosalie Kitchens, MD;  Location: [facility];  Service: Gastroenterology;  Laterality: N/A;   ESOPHAGOGASTRODUODENOSCOPY (EGD) WITH PROPOFOL  N/A 09/11/2022   Procedure:  ESOPHAGOGASTRODUODENOSCOPY (EGD) WITH PROPOFOL ;  Surgeon: Wilhelmenia Aloha Raddle., MD;  Location: [facility];  Service: Gastroenterology;  Laterality: N/A;   EUS N/A 09/11/2022   Procedure: UPPER ENDOSCOPIC ULTRASOUND (EUS) RADIAL;  Surgeon: Wilhelmenia Aloha Raddle., MD;  Location: [facility];  Service: Gastroenterology;  Laterality: N/A;   FOREIGN BODY REMOVAL Left 02/10/2015   Procedure: ATTEMPTED EXPLANTATION OF BIRTH CONTROL DEVICE LEFT ARM;  Surgeon: Donnice Lunger, MD;  Location: [facility];  Service: General;  Laterality: Left;   FOREIGN BODY REMOVAL Left 05/12/2015   Procedure: REMOVAL OF LEFT ARM BIRTH CONTROL DEVICE;  Surgeon: Donnice Lunger, MD;  Location: [facility];  Service: General;  Laterality: Left;   NEUROLYTIC CELIAC PLEXUS N/A 09/11/2022   Procedure: NEUROLYTIC CELIAC PLEXUS;  Surgeon: Wilhelmenia Aloha Raddle., MD;  Location: [facility];  Service: Gastroenterology;  Laterality: N/A;   SUBMANDIBULAR GLAND EXCISION Right 05/01/2009    Current Outpatient Medications  Medication Sig Dispense Refill   acetaminophen  (TYLENOL ) 500 MG tablet Take 1,000 mg by mouth every 6 (six) hours as needed for moderate pain.     albuterol  (VENTOLIN  HFA) 108 (90 Base) MCG/ACT inhaler Inhale 2 puffs into the lungs every 6 (six) hours as needed for wheezing or shortness of breath. 8 g 2   amLODipine  (NORVASC ) 10 MG tablet Take 1 tablet (10 mg total) by mouth daily. 90 tablet 3   carvedilol  (COREG ) 12.5 MG tablet Take 1 tablet (12.5 mg total) by mouth 2 (two) times daily. 60 tablet 11   cetirizine  (ZYRTEC  ALLERGY) 10 MG tablet Take 1 tablet (10 mg total) by mouth once as needed for allergies. 30 tablet 0   cloNIDine  (CATAPRES ) 0.2 MG tablet Take 1 tablet (0.2 mg total) by mouth every 8 (eight) hours. 90 tablet 11   dextromethorphan-guaiFENesin  (MUCINEX  DM) 30-600 MG 12hr tablet Take 1 tablet by mouth 2 (two) times daily as needed for cough. 30 tablet 0   furosemide   (LASIX ) 20 MG tablet Take 2 tablets (40 mg total) by mouth daily. 14 tablet 0   hydrALAZINE  (APRESOLINE ) 100 MG tablet Take 1 tablet (100 mg total) by mouth 3 (three) times daily. 90 tablet 11   ibuprofen  (ADVIL ) 600 MG tablet Take 1 tablet (600 mg total) by mouth every 6 (six) hours as needed. 30 tablet 0   lipase/protease/amylase (CREON ) 36000 UNITS CPEP capsule Take 2 capsules (72,000 Units total) by mouth 3 (three) times daily with meals. May also take 1 capsule (36,000 Units total) as needed (with snacks). 240 capsule 11   losartan  (COZAAR ) 100 MG tablet Take 1 tablet (100 mg total) by mouth daily. 90 tablet 11   ondansetron  (ZOFRAN ) 4 MG tablet Take 1 tablet (4 mg total) by mouth every 8 (eight) hours as needed for nausea or vomiting. 30 tablet 0   predniSONE  (DELTASONE ) 10 MG tablet Take 3 tablets (30 mg total) by mouth daily with breakfast. 15 tablet 0   traZODone  (DESYREL ) 50 MG tablet Take 1 tablet (50 mg total) by mouth at bedtime. 30 tablet 1   No current facility-administered medications for this visit.    Allergies as of 01/03/2025 - Review  Complete 12/31/2024  Allergen Reaction Noted   Ativan  [lorazepam ] Other (See Comments) 11/13/2020   Tape Rash 10/30/2022    Family History  Problem Relation Age of Onset   Breast cancer Mother 26   Heart disease Mother    Hypertension Mother    Colon cancer Neg Hx    Esophageal cancer Neg Hx    Inflammatory bowel disease Neg Hx    Liver disease Neg Hx    Pancreatic cancer Neg Hx    Rectal cancer Neg Hx    Stomach cancer Neg Hx     Social History[1]   Review of Systems:    Constitutional: No weight loss, fever, chills, weakness or fatigue Eyes: No change in vision Ears, Nose, Throat:  No change in hearing or congestion Skin: No rash or itching Cardiovascular: No chest pain, chest pressure or palpitations   Respiratory: No SOB or cough Gastrointestinal: See HPI and otherwise negative Genitourinary: No dysuria or change in  urinary frequency Neurological: No headache, dizziness or syncope Musculoskeletal: No new muscle or joint pain Hematologic: No bleeding or bruising    Physical Exam:  Vital signs: There were no vitals taken for this visit.  Wt Readings from Last 3 Encounters:  12/31/24 242 lb 9.6 oz (110 kg)  12/24/24 251 lb 9.6 oz (114.1 kg)  07/15/23 202 lb 11.2 oz (91.9 kg)     Constitutional: NAD, Well developed, Well nourished, alert and cooperative Head:  Normocephalic and atraumatic.  Eyes: No scleral icterus. Conjunctiva pink. Mouth: No oral lesions. Respiratory: Respirations even and unlabored. Lungs clear to auscultation bilaterally.  No wheezes, crackles, or rhonchi.  Cardiovascular:  Regular rate and rhythm. No murmurs. No peripheral edema. Gastrointestinal:  Soft, nondistended, nontender. No rebound or guarding. Normal bowel sounds. No appreciable masses or hepatomegaly. Rectal:  Not performed.  Neurologic:  Alert and oriented x4;  grossly normal neurologically.  Skin:   Dry and intact without significant lesions or rashes. Psychiatric: Oriented to person, place and time. Demonstrates good judgement and reason without abnormal affect or behaviors.      Assessment/Plan:   Assessment & Plan        Camie Furbish, PA-C [facility] 01/03/2025, 8:23 AM  Patient Care Team: Celestina Czar, MD as PCP - General      [1]  Social History Tobacco Use   Smoking status: Every Day    Current packs/day: 0.12    Average packs/day: 0.1 packs/day for 5.0 years (0.6 ttl pk-yrs)    Types: Cigarettes   Smokeless tobacco: Never   Tobacco comments:    1 pack/week  Vaping Use   Vaping status: Never Used  Substance Use Topics   Alcohol use: Not Currently    Comment: occasionally   Drug use: No   "

## 2025-01-03 NOTE — Telephone Encounter (Signed)
 Copied from CRM [id number]. Topic: General - Other >> Dec 31, 2024  3:16 PM Debby BROCKS wrote: Reason for CRM: Cathlean from the breast center called in stating that they sent an order to be signed for the patient but no response as of yet. The patient has an appointment This upcoming Tuesday and if its not signed then they need to reschedule her Callback: [phone]

## 2025-01-03 NOTE — Progress Notes (Signed)
 Internal Medicine Clinic Attending  Case discussed with the resident at the time of the visit.  We reviewed the resident's history and exam and pertinent patient test results.  I agree with the assessment, diagnosis, and plan of care documented in the resident's note.

## 2025-01-04 ENCOUNTER — Other Ambulatory Visit

## 2025-01-04 ENCOUNTER — Encounter

## 2025-01-05 NOTE — Progress Notes (Signed)
 Internal Medicine Clinic Attending  Case discussed with the resident at the time of the visit.  We reviewed the resident's history and exam and pertinent patient test results.  I agree with the assessment, diagnosis, and plan of care documented in the resident's note.

## 2025-01-07 ENCOUNTER — Ambulatory Visit

## 2025-01-07 ENCOUNTER — Ambulatory Visit: Attending: Cardiology | Admitting: Cardiology

## 2025-01-07 ENCOUNTER — Encounter: Payer: Self-pay | Admitting: Cardiology

## 2025-01-07 VITALS — BP 119/84 | HR 93 | Resp 17 | Ht 68.0 in | Wt 242.0 lb

## 2025-01-07 VITALS — BP 113/76 | HR 16 | Temp 98.0°F | Resp 16 | Ht 68.0 in | Wt 240.2 lb

## 2025-01-07 DIAGNOSIS — R600 Localized edema: Secondary | ICD-10-CM

## 2025-01-07 DIAGNOSIS — I71012 Dissection of descending thoracic aorta: Secondary | ICD-10-CM | POA: Diagnosis not present

## 2025-01-07 DIAGNOSIS — I10 Essential (primary) hypertension: Secondary | ICD-10-CM | POA: Diagnosis not present

## 2025-01-07 DIAGNOSIS — R0609 Other forms of dyspnea: Secondary | ICD-10-CM | POA: Diagnosis not present

## 2025-01-07 DIAGNOSIS — D509 Iron deficiency anemia, unspecified: Secondary | ICD-10-CM

## 2025-01-07 LAB — PRO B NATRIURETIC PEPTIDE: NT-Pro BNP: 113 pg/mL (ref 0–130)

## 2025-01-07 MED ORDER — IRON SUCROSE 300 MG IVPB - SIMPLE MED
300.0000 mg | Freq: Once | Status: AC
  Administered 2025-01-07: 300 mg via INTRAVENOUS
  Filled 2025-01-07: qty 265

## 2025-01-07 NOTE — Progress Notes (Signed)
 Diagnosis: Iron  Deficiency Anemia  Provider:  Praveen Mannam MD  Procedure: IV Infusion  IV Type: Peripheral, IV Location: L Forearm   Venofer  (Iron  Sucrose), Dose: 300 mg  Infusion Start Time: 1039  Infusion Stop Time: 1216  Post Infusion IV Care: Observation period completed and Peripheral IV Discontinued  Discharge: Condition: Good, Destination: Home . AVS Declined  Performed by:  Leita FORBES Miles, LPN

## 2025-01-07 NOTE — Progress Notes (Signed)
 " Cardiology Office Note:  .   Date:  01/07/2025  ID:  Robin Guerrero, DOB November 04, 1986, MRN [id number] PCP: Celestina Czar, MD  [facility] Providers Cardiologist:  Newman Lawrence, MD PCP: Celestina Czar, MD  Chief Complaint  Patient presents with   Primary hypertension   Follow-up     Robin Guerrero is a 39 y.o. female with hypertension, chronic pancreatitis, h/o tobacco and alcohol abuse, anemia, type B  aortic dissection in 11/2020.   Discussed the use of AI scribe software for clinical note transcription with the patient, who gave verbal consent to proceed.  History of Present Illness Robin Guerrero is a 39 year old female with hypertension and aortic dissection who presents with shortness of breath and swelling.  She reports one month of shortness of breath with swelling of her feet, hands, and face. Symptoms began when she lost access to medications after an insurance lapse. Since restarting Lasix  with restored insurance, dyspnea and edema have improved. Her weight decreased from 251 lb to 236 lb in the morning, with daytime weight around 241 lb.  She has hypertension and a prior type B aortic dissection treated in the ICU by vascular surgery. She is on amlodipine , carvedilol , clonidine , hydralazine , and losartan . During the period without insurance she took medications inconsistently using leftover supplies. She is now taking all prescribed medications but often misses the third daily dose of clonidine .  She has iron  deficiency anemia with a recent hemoglobin of 7.8 and started iron  infusions today, with two more planned. She is unsure of the cause but reports heavy menstrual periods at times.  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 She has sleep apnea and is not using CPAP.   Vitals:   01/07/25 1433  BP: 119/84  Pulse: 93  Resp: 17  SpO2: 98%      Review of Systems  Cardiovascular:  Positive for dyspnea on exertion  and leg swelling. Negative for chest pain, palpitations and syncope.        Studies Reviewed: SABRA        EKG 01/07/2025: Normal sinus rhythm Possible Left atrial enlargement Prolonged QT When compared with ECG of 07-Jan-2025 14:34, No significant change was found  Labs 12/2024: Chol 90, TG 114, HDL 24, LDL 45 HbA1C 5.7% Hb 7.8 Cr 0.8    Physical Exam Vitals and nursing note reviewed.  Constitutional:      General: She is not in acute distress. Neck:     Vascular: No JVD.  Cardiovascular:     Rate and Rhythm: Normal rate and regular rhythm.     Heart sounds: Normal heart sounds. No murmur heard. Pulmonary:     Effort: Pulmonary effort is normal.     Breath sounds: Normal breath sounds. No wheezing or rales.  Musculoskeletal:     Right lower leg: No edema.     Left lower leg: No edema.      VISIT DIAGNOSES:   ICD-10-CM   1. Primary hypertension  I10 EKG 12-Lead    2. DOE (dyspnea on exertion)  R06.09 Pro b natriuretic peptide (BNP)    ECHOCARDIOGRAM COMPLETE    3. Leg edema  R60.0 Pro b natriuretic peptide (BNP)    ECHOCARDIOGRAM COMPLETE    4. 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HCC)  I71.012        Robin Guerrero is a 39 y.o. female with hypertension, chronic pancreatitis, h/o tobacco and alcohol abuse, anemia, type B  aortic dissection in  11/2020.   Assessment & Plan Primary hypertension: Hypertension is well-controlled with current medication regimen. - Continue current antihypertensive medications: amlodipine , carvedilol , clonidine , hydralazine , and losartan . - Encouraged regular physical activity to potentially reduce medication needs.  Type B aortic dissection: Likely secondary to uncontrolled hypertension, now well-managed with improved blood pressure control. - Continue current antihypertensive regimen including amlodipine , carvedilol , clonidine , hydralazine , and losartan . - She has upcoming CT angiogram.  HFpEF:  Symptoms improved  with Lasix . Anemia may contribute to symptoms. ProBNP will help determine if symptoms are due to fluid retention or other causes. - Continue Lasix  40 mg daily. - Ordered echocardiogram to assess cardiac function. - Ordered ProBNP blood test to evaluate for fluid retention. - Coordinated with internal medicine for anemia management and iron  infusions.  QT prolongation: Also noted on previous EKGs in 2024.  No history of syncope.  Will need to further look into any family history of sudden death.  In absence of symptoms, reasonable to treat with beta-blockers, currently on carvedilol .  Avoid QT prolonging medications.  If she does have family history of SCD, could consider further workup in future.     F/u in 3 months  Signed, Newman JINNY Lawrence, MD  "

## 2025-01-07 NOTE — Patient Instructions (Signed)
" °  Lab Work: Probnp  If you have labs (blood work) drawn today and your tests are completely normal, you will receive your results only by: MyChart Message (if you have MyChart) OR A paper copy in the mail If you have any lab test that is abnormal or we need to change your treatment, we will call you to review the results.  Testing/Procedures: Echocardiogram  Your physician has requested that you have an echocardiogram. Echocardiography is a painless test that uses sound waves to create images of your heart. It provides your doctor with information about the size and shape of your heart and how well your hearts chambers and valves are working. This procedure takes approximately one hour. There are no restrictions for this procedure. Please do NOT wear cologne, perfume, aftershave, or lotions (deodorant is allowed). Please arrive 15 minutes prior to your appointment time.  Please note: We ask at that you not bring children with you during ultrasound (echo/ vascular) testing. Due to room size and safety concerns, children are not allowed in the ultrasound rooms during exams. Our front office staff cannot provide observation of children in our lobby area while testing is being conducted. An adult accompanying a patient to their appointment will only be allowed in the ultrasound room at the discretion of the ultrasound technician under special circumstances. We apologize for any inconvenience.   Follow-Up: At [facility], you and your health needs are our priority.  As part of our continuing mission to provide you with exceptional heart care, our providers are all part of one team.  This team includes your primary Cardiologist (physician) and Advanced Practice Providers or APPs (Physician Assistants and Nurse Practitioners) who all work together to provide you with the care you need, when you need it.  Your next appointment:   3 month(s)  Provider:   Newman JINNY Lawrence, MD             "

## 2025-01-07 NOTE — Telephone Encounter (Signed)
 I called this patient for update on volume status and weight as she has been on Lasix  40 mg for the past seven days; she reports that she is currently in the cardiology office getting evaluated. Patient was informed to notify the cardiologist about the Lasix  and SOB.

## 2025-01-08 ENCOUNTER — Ambulatory Visit: Payer: Self-pay | Admitting: Cardiology

## 2025-01-08 ENCOUNTER — Encounter: Payer: Self-pay | Admitting: Cardiology

## 2025-01-08 DIAGNOSIS — R600 Localized edema: Secondary | ICD-10-CM | POA: Insufficient documentation

## 2025-01-11 ENCOUNTER — Ambulatory Visit: Admitting: Vascular Surgery

## 2025-01-11 ENCOUNTER — Ambulatory Visit (HOSPITAL_COMMUNITY): Admission: RE | Admit: 2025-01-11 | Source: Ambulatory Visit | Attending: Vascular Surgery | Admitting: Vascular Surgery

## 2025-01-14 ENCOUNTER — Ambulatory Visit (INDEPENDENT_AMBULATORY_CARE_PROVIDER_SITE_OTHER)

## 2025-01-14 VITALS — BP 124/80 | HR 89 | Temp 98.0°F | Resp 18 | Ht 68.0 in | Wt 245.2 lb

## 2025-01-14 DIAGNOSIS — D509 Iron deficiency anemia, unspecified: Secondary | ICD-10-CM

## 2025-01-14 MED ORDER — IRON SUCROSE 300 MG IVPB - SIMPLE MED
300.0000 mg | Freq: Once | Status: AC
  Administered 2025-01-14: 300 mg via INTRAVENOUS

## 2025-01-14 NOTE — Progress Notes (Signed)
 Diagnosis:  Iron  Deficiency Anemia  Provider:  Mannam, Praveen MD  Procedure: IV Infusion  IV Type: Peripheral, IV Location: L Forearm   Venofer  (Iron  Sucrose), Dose: 300 mg  Infusion Start Time: 1338 pm  Infusion Stop Time: 1505 pm  Post Infusion IV Care: Observation period completed and Peripheral IV Discontinued  Discharge: Condition: Good, Destination: Home . AVS Declined  Performed by:  Trudy Lamarr LABOR, RN

## 2025-01-16 ENCOUNTER — Other Ambulatory Visit: Payer: Self-pay

## 2025-01-18 ENCOUNTER — Telehealth: Payer: Self-pay | Admitting: *Deleted

## 2025-01-18 NOTE — Telephone Encounter (Unsigned)
 Copied from CRM #[id number]. Topic: Referral - Status >> Jan 18, 2025 10:30 AM Miquel SAILOR wrote: Reason for CRM: PEND-ECHOCARDIOGRAM COMPLETE (Order # [id number]) on 12/31/2024-Robin Guerrero from [phone]: Pt need speak to office in regards to Prior Auth till PEND. Called and transferred to [facility]

## 2025-01-18 NOTE — Telephone Encounter (Signed)
 Medication sent to pharmacy

## 2025-01-21 ENCOUNTER — Other Ambulatory Visit: Payer: Self-pay | Admitting: Vascular Surgery

## 2025-01-21 ENCOUNTER — Encounter

## 2025-01-21 ENCOUNTER — Ambulatory Visit: Admitting: Gastroenterology

## 2025-01-21 ENCOUNTER — Inpatient Hospital Stay: Admission: RE | Admit: 2025-01-21

## 2025-01-21 ENCOUNTER — Other Ambulatory Visit: Payer: Self-pay | Admitting: Student

## 2025-01-21 ENCOUNTER — Ambulatory Visit: Admission: RE | Admit: 2025-01-21 | Source: Ambulatory Visit

## 2025-01-21 DIAGNOSIS — R2231 Localized swelling, mass and lump, right upper limb: Secondary | ICD-10-CM

## 2025-01-21 DIAGNOSIS — I71019 Dissection of thoracic aorta, unspecified: Secondary | ICD-10-CM

## 2025-01-21 NOTE — Progress Notes (Unsigned)
 "  Robin Guerrero [id number] [birth year]   Chief Complaint:  Referring Provider: Celestina Czar, MD Primary GI MD: Dr. Wilhelmenia  HPI: Robin Guerrero is a 39 y.o. female with past medical history of aortic dissection, anemia, GERD, HTN, OSA, chronic pancreatitis who presents today for a complaint of *** .    Patient with history significant for recurrent acute pancreatitis.   07/30/2022 to 08/02/2022 admitted to hospital after presenting with abdominal pain and hematemesis.  Prior history of alcohol abuse noted but had been sober for a year.  Had EGD June 2023 with varices done by Dr. Elicia with varix noted.  CT of the abdomen and pelvis showed acute pancreatitis involving the pancreatic head as well as multiple small.  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  Underwent EGD with no significant findings.   Patient underwent upper EUS 09/11/2022 by Dr. Wilhelmenia with the following indications: Pancreatic cyst on CT scan, Exclusion of chronic pancreatitis, Acute recurrent pancreatitis, Celiac plexus block for pain secondary to chronic pancreatitis, Hematemesis. At that time was in [place] [facility] patient.  Celiac plexus block done at that time.   09/12/2022 patient seen at [facility] for advanced endoscopy/biliary consultation.  Per note at that time, history of recurrent alcohol associated pancreatitis in 2021.  Recent hospitalization and upper EUS with celiac plexus block was reviewed.   10/28/2022 seen in the ED for nausea, vomiting, intermittent hematemesis since May 2023.  CT showed chronic splenic vein occlusion with stability of dissection and possible acute on chronic pancreatitis.  No cholecystitis.  Discharged home on Reglan .   10/30/2022 presented again to the ED with episode of hematochezia and ongoing abdominal pain and nausea/vomiting.  Given multiple rounds of droperidol , Zofran , Dilaudid .  Tachycardic 120.  Signed out AGAINST MEDICAL ADVICE in the early hours due to childcare  issues but represented later in the morning with the same symptoms.   10/31/2022 to 11/03/2022 admitted to hospital with hypertension, CT chest showing stable aortic dissection type B.  Urine tox positive for opioids and THC.  Regarding her pancreatitis, she has a chronic pancreatic pseudocyst, chronic splenic vein narrowing, lipase mildly elevated at 90 with improving abdominal pain, tolerating diet, was advised to continue Creon  with meals and resume medications.   Seen again by [facility] 11/21/2022.  Plan at that time was to keep pain management appointment which had been scheduled for the following week, trial ERCP with PD stenting to see if pain would improve.  Was also following with hepatology team at that time.   ERCP 12/10/2022 showed normal major papilla, pancreatic duct leak, marked chronic pancreatitis, and a plastic pancreatic stent was placed in the ventral pancreatic duct.  Plan was to repeat ERCP within 3 months to exchange stent.   12/10/2022 seen at [facility] liver clinic for follow-up of portal hypertension.  Plan at that time was to reevaluate for gastric varices at time of next ERCP, no indication to resume beta-blocker therapy given absence of gastric and esophageal varices on most recent endoscopy.   12/16/2022 to 12/21/2022 patient admitted to the hospital after presenting to the ED with nausea and 10/10 abdominal pain.  Workup showed normal lipase but CT A/P showed acute on chronic pancreatitis with possible necrosis involving the body and tail of pancreas.  Admitted with acute on chronic pancreatitis and sepsis secondary to healthcare associated pneumonia.   03/18/2023 patient seen by [facility] for stent follow-up. Plan at that time was to schedule ERCP with stent exchange. Per Dr. Mayra initial consult note:  Possible etiologies of pancreatitis: Biliary: no history of gallstones to our knowledge;  Alcohol: has not drank in 1 year; previously heavy drinker  Cigarettes: active smoker; was  trying to quit; 5-6 cigarettes; when she's trying not to smoke 4 cigarettes; has quit in the past Pharm/Supplements: none  TG: need to check in future for completeness, do not see in her chart ** Prior Trauma: no known history Panc divisum: no documentation on MRI/MRCP  FH: no known history   Last note with GI is a telephone encounter from 06/16/2024.  Appears that multiple attempts were made to contact patient to discuss biliary stent removal but appear she could not be reached.  She has been referred to sleep medicine for OSA. Seen by PCP endorsing shortness of breath on 12/31/2024.  Echocardiogram was ordered and is scheduled for 02/16/2025.  She has been receiving iron  infusions for IDA.  Discussed the use of AI scribe software for clinical note transcription with the patient, who gave verbal consent to proceed.  History of Present Illness       Previous GI Procedures/Imaging   ERCP 12/10/2022 - The major papilla appeared normal. - A pancreatic duct leak was found. - Marked chronic pancreatitis. - One plastic pancreatic stent was placed into the ventral pancreatic duct. - No specimens collected.    Upper EUS 09/11/2022 EGD impression:  - No gross lesions in esophagus. Z- line irregular, 39 cm from the incisors. - Type 1 isolated gastric varices ( IGV1, varices located in the fundus) , without bleeding.  - Erythematous mucosa in the stomach. Biopsied.  - No gross lesions in the duodenal bulb, in the first portion of the duodenum and in the second portion of the duodenum.  - Normal major papilla hidden under a hood.   EUS impression:  - Pancreatic parenchymal abnormalities consisting of atrophy, diffuse echogenicity, lobularity and hyperechoic strands were noted in the entire pancreas.  - The pancreatic duct was normal in the head, slightly prominent in the neck and body area and normal in the tail. Sidebranch dilation in the body/ tail area was noted.  - A few cystic lesions  were seen in the genu of the pancreas and pancreatic body. Tissue has not been obtained. However, the endosonographic appearance is suggestive of a pancreatic pseudocysts.  - There was no sign of significant pathology in the common bile duct and in the common hepatic duct.  - Small amount of hyperechoic material consistent with sludge was visualized endosonographically in the gallbladder.  - A few enlarged lymph nodes were visualized in the perigastric region and peripancreatic region. Tissue has not been obtained. However, the endosonographic appearance is suggestive of benign inflammatory changes.  - Varices were visualized endosonographically in the cardia of the stomach and in the fundus of the stomach.  - Celiac plexus block performed.   EGD 08/01/2022 (Dr. Rosalie) - Small hiatal hernia.  - Gastric varices, without bleeding.  - Normal duodenal bulb, first portion of the duodenum, second portion of the duodenum and third portion of the duodenum.  - The examination was otherwise normal.  - No specimens collected.   Past Medical History:  Diagnosis Date   Descending thoracic dissection (HCC)    History of anemia    no current med.   Hypertension    states under control with med., has been on med. x 3 yr.   OSA (obstructive sleep apnea) 12/31/2024   Pancreatitis     Past Surgical History:  Procedure Laterality Date  BIOPSY  09/11/2022   Procedure: BIOPSY;  Surgeon: Wilhelmenia Aloha Raddle., MD;  Location: [facility];  Service: Gastroenterology;;   ESOPHAGOGASTRODUODENOSCOPY N/A 06/06/2022   Procedure: ESOPHAGOGASTRODUODENOSCOPY (EGD);  Surgeon: Burnette Fallow, MD;  Location: [facility];  Service: Gastroenterology;  Laterality: N/A;   ESOPHAGOGASTRODUODENOSCOPY N/A 06/14/2022   Procedure: ESOPHAGOGASTRODUODENOSCOPY (EGD);  Surgeon: Elicia Claw, MD;  Location: [facility];  Service: Gastroenterology;  Laterality: N/A;   ESOPHAGOGASTRODUODENOSCOPY (EGD) WITH PROPOFOL  N/A  08/01/2022   Procedure: ESOPHAGOGASTRODUODENOSCOPY (EGD) WITH PROPOFOL ;  Surgeon: Rosalie Kitchens, MD;  Location: [facility];  Service: Gastroenterology;  Laterality: N/A;   ESOPHAGOGASTRODUODENOSCOPY (EGD) WITH PROPOFOL  N/A 09/11/2022   Procedure: ESOPHAGOGASTRODUODENOSCOPY (EGD) WITH PROPOFOL ;  Surgeon: Wilhelmenia Aloha Raddle., MD;  Location: [facility];  Service: Gastroenterology;  Laterality: N/A;   EUS N/A 09/11/2022   Procedure: UPPER ENDOSCOPIC ULTRASOUND (EUS) RADIAL;  Surgeon: Wilhelmenia Aloha Raddle., MD;  Location: [facility];  Service: Gastroenterology;  Laterality: N/A;   FOREIGN BODY REMOVAL Left 02/10/2015   Procedure: ATTEMPTED EXPLANTATION OF BIRTH CONTROL DEVICE LEFT ARM;  Surgeon: Donnice Lunger, MD;  Location: [facility];  Service: General;  Laterality: Left;   FOREIGN BODY REMOVAL Left 05/12/2015   Procedure: REMOVAL OF LEFT ARM BIRTH CONTROL DEVICE;  Surgeon: Donnice Lunger, MD;  Location: [facility];  Service: General;  Laterality: Left;   NEUROLYTIC CELIAC PLEXUS N/A 09/11/2022   Procedure: NEUROLYTIC CELIAC PLEXUS;  Surgeon: Wilhelmenia Aloha Raddle., MD;  Location: [facility];  Service: Gastroenterology;  Laterality: N/A;   SUBMANDIBULAR GLAND EXCISION Right 05/01/2009    Current Outpatient Medications  Medication Sig Dispense Refill   acetaminophen  (TYLENOL ) 500 MG tablet Take 1,000 mg by mouth every 6 (six) hours as needed for moderate pain.     albuterol  (VENTOLIN  HFA) 108 (90 Base) MCG/ACT inhaler Inhale 2 puffs into the lungs every 6 (six) hours as needed for wheezing or shortness of breath. 8 g 2   amLODipine  (NORVASC ) 10 MG tablet Take 1 tablet (10 mg total) by mouth daily. 90 tablet 3   carvedilol  (COREG ) 12.5 MG tablet Take 1 tablet (12.5 mg total) by mouth 2 (two) times daily. 60 tablet 11   cetirizine  (ZYRTEC ) 10 MG tablet TAKE 1 TABLET (10 MG TOTAL) BY MOUTH ONCE AS NEEDED FOR ALLERGIES. 90 tablet 1   cloNIDine  (CATAPRES ) 0.2 MG  tablet Take 1 tablet (0.2 mg total) by mouth every 8 (eight) hours. 90 tablet 11   dextromethorphan-guaiFENesin  (MUCINEX  DM) 30-600 MG 12hr tablet Take 1 tablet by mouth 2 (two) times daily as needed for cough. 30 tablet 0   furosemide  (LASIX ) 20 MG tablet Take 2 tablets (40 mg total) by mouth daily. 14 tablet 0   hydrALAZINE  (APRESOLINE ) 100 MG tablet Take 1 tablet (100 mg total) by mouth 3 (three) times daily. 90 tablet 11   ibuprofen  (ADVIL ) 600 MG tablet Take 1 tablet (600 mg total) by mouth every 6 (six) hours as needed. 30 tablet 0   lipase/protease/amylase (CREON ) 36000 UNITS CPEP capsule Take 2 capsules (72,000 Units total) by mouth 3 (three) times daily with meals. May also take 1 capsule (36,000 Units total) as needed (with snacks). 240 capsule 11   losartan  (COZAAR ) 100 MG tablet Take 1 tablet (100 mg total) by mouth daily. 90 tablet 11   ondansetron  (ZOFRAN ) 4 MG tablet Take 1 tablet (4 mg total) by mouth every 8 (eight) hours as needed for nausea or vomiting. 30 tablet 0   predniSONE  (DELTASONE ) 10 MG tablet Take  3 tablets (30 mg total) by mouth daily with breakfast. (Patient not taking: Reported on 01/07/2025) 15 tablet 0   traZODone  (DESYREL ) 50 MG tablet Take 1 tablet (50 mg total) by mouth at bedtime. 30 tablet 1   No current facility-administered medications for this visit.    Allergies as of 01/21/2025 - Review Complete 01/08/2025  Allergen Reaction Noted   Ativan  [lorazepam ] Other (See Comments) 11/13/2020   Tape Rash 10/30/2022    Family History  Problem Relation Age of Onset   Breast cancer Mother 12   Heart disease Mother    Hypertension Mother    Colon cancer Neg Hx    Esophageal cancer Neg Hx    Inflammatory bowel disease Neg Hx    Liver disease Neg Hx    Pancreatic cancer Neg Hx    Rectal cancer Neg Hx    Stomach cancer Neg Hx     Social History[1]   Review of Systems:    Constitutional: No weight loss, fever, chills, weakness or fatigue Eyes: No change  in vision Ears, Nose, Throat:  No change in hearing or congestion Skin: No rash or itching Cardiovascular: No chest pain, chest pressure or palpitations   Respiratory: No SOB or cough Gastrointestinal: See HPI and otherwise negative Genitourinary: No dysuria or change in urinary frequency Neurological: No headache, dizziness or syncope Musculoskeletal: No new muscle or joint pain Hematologic: No bleeding or bruising    Physical Exam:  Vital signs: There were no vitals taken for this visit.  Wt Readings from Last 3 Encounters:  01/14/25 245 lb 3.2 oz (111.2 kg)  01/07/25 242 lb (109.8 kg)  01/07/25 240 lb 3.2 oz (109 kg)     Constitutional: NAD, Well developed, Well nourished, alert and cooperative Head:  Normocephalic and atraumatic.  Eyes: No scleral icterus. Conjunctiva pink. Mouth: No oral lesions. Respiratory: Respirations even and unlabored. Lungs clear to auscultation bilaterally.  No wheezes, crackles, or rhonchi.  Cardiovascular:  Regular rate and rhythm. No murmurs. No peripheral edema. Gastrointestinal:  Soft, nondistended, nontender. No rebound or guarding. Normal bowel sounds. No appreciable masses or hepatomegaly. Rectal:  Not performed.  Neurologic:  Alert and oriented x4;  grossly normal neurologically.  Skin:   Dry and intact without significant lesions or rashes. Psychiatric: Oriented to person, place and time. Demonstrates good judgement and reason without abnormal affect or behaviors.      Assessment/Plan:   Assessment & Plan     Discuss with Dr. Wilhelmenia regarding stent removal Needs to have cardiac clearance, echo in March   Camie Furbish, PA-C [facility] 01/21/2025, 9:43 AM  Patient Care Team: Celestina Czar, MD as PCP - General Patwardhan, Newman PARAS, MD as PCP - Cardiology (Cardiology)      [1]  Social History Tobacco Use   Smoking status: Every Day    Current packs/day: 0.12    Average packs/day: 0.1 packs/day for 5.0  years (0.6 ttl pk-yrs)    Types: Cigarettes   Smokeless tobacco: Never   Tobacco comments:    1 pack/week  Vaping Use   Vaping status: Never Used  Substance Use Topics   Alcohol use: Not Currently    Comment: occasionally   Drug use: No   "

## 2025-01-24 ENCOUNTER — Ambulatory Visit

## 2025-01-24 ENCOUNTER — Other Ambulatory Visit

## 2025-01-25 ENCOUNTER — Ambulatory Visit: Admitting: Vascular Surgery

## 2025-02-16 ENCOUNTER — Ambulatory Visit (HOSPITAL_COMMUNITY)

## 2025-04-06 ENCOUNTER — Ambulatory Visit: Admitting: Cardiology
